# Patient Record
Sex: Male | Born: 1950
Health system: Southern US, Community
[De-identification: ages and names within clinical notes are randomized; demographics above are authoritative.]

## PROBLEM LIST (undated history)

## (undated) DIAGNOSIS — I714 Abdominal aortic aneurysm, without rupture, unspecified: Secondary | ICD-10-CM

## (undated) DIAGNOSIS — I2510 Atherosclerotic heart disease of native coronary artery without angina pectoris: Secondary | ICD-10-CM

## (undated) DIAGNOSIS — F419 Anxiety disorder, unspecified: Secondary | ICD-10-CM

## (undated) DIAGNOSIS — G4733 Obstructive sleep apnea (adult) (pediatric): Secondary | ICD-10-CM

## (undated) DIAGNOSIS — E785 Hyperlipidemia, unspecified: Secondary | ICD-10-CM

## (undated) DIAGNOSIS — I5032 Chronic diastolic (congestive) heart failure: Secondary | ICD-10-CM

## (undated) DIAGNOSIS — E118 Type 2 diabetes mellitus with unspecified complications: Secondary | ICD-10-CM

## (undated) DIAGNOSIS — I10 Essential (primary) hypertension: Secondary | ICD-10-CM

## (undated) DIAGNOSIS — I723 Aneurysm of iliac artery: Secondary | ICD-10-CM

## (undated) DIAGNOSIS — I4892 Unspecified atrial flutter: Secondary | ICD-10-CM

## (undated) DIAGNOSIS — C801 Malignant (primary) neoplasm, unspecified: Secondary | ICD-10-CM

## (undated) DIAGNOSIS — J449 Chronic obstructive pulmonary disease, unspecified: Secondary | ICD-10-CM

## (undated) DIAGNOSIS — J45909 Unspecified asthma, uncomplicated: Secondary | ICD-10-CM

## (undated) HISTORY — PX: ADENOIDECTOMY: SUR15

## (undated) HISTORY — DX: Obstructive sleep apnea (adult) (pediatric): G47.33

## (undated) HISTORY — PX: CARDIAC CATHETERIZATION: SHX172

## (undated) HISTORY — DX: Malignant (primary) neoplasm, unspecified: C80.1

## (undated) HISTORY — DX: Abdominal aortic aneurysm, without rupture, unspecified: I71.40

## (undated) HISTORY — DX: Anxiety disorder, unspecified: F41.9

## (undated) HISTORY — PX: HERNIA REPAIR: SHX51

## (undated) HISTORY — PX: ELBOW SURGERY: SHX618

## (undated) HISTORY — PX: UMBILICAL HERNIA REPAIR: SHX196

## (undated) HISTORY — DX: Chronic diastolic (congestive) heart failure: I50.32

## (undated) HISTORY — DX: Chronic obstructive pulmonary disease, unspecified: J44.9

## (undated) HISTORY — DX: Unspecified asthma, uncomplicated: J45.909

## (undated) HISTORY — DX: Atherosclerotic heart disease of native coronary artery without angina pectoris: I25.10

## (undated) HISTORY — DX: Type 2 diabetes mellitus with unspecified complications: E11.8

## (undated) HISTORY — PX: AMPUTATION: SHX166

## (undated) HISTORY — DX: Essential (primary) hypertension: I10

## (undated) HISTORY — DX: Abdominal aortic aneurysm, without rupture: I71.4

## (undated) HISTORY — PX: APPENDECTOMY: SHX54

## (undated) HISTORY — DX: Hyperlipidemia, unspecified: E78.5

## (undated) HISTORY — PX: TONSILECTOMY, ADENOIDECTOMY, BILATERAL MYRINGOTOMY AND TUBES: SHX2538

## (undated) HISTORY — DX: Unspecified atrial flutter: I48.92

## (undated) HISTORY — PX: PILONIDAL CYST EXCISION: SHX744

## (undated) HISTORY — PX: NASAL SINUS SURGERY: SHX719

## (undated) HISTORY — DX: Aneurysm of iliac artery: I72.3

---

## 1993-09-17 HISTORY — PX: PENILE PROSTHESIS IMPLANT: SHX240

## 2000-04-08 ENCOUNTER — Emergency Department (HOSPITAL_COMMUNITY): Admission: EM | Admit: 2000-04-08 | Discharge: 2000-04-08 | Payer: Self-pay | Admitting: Emergency Medicine

## 2002-11-04 ENCOUNTER — Ambulatory Visit (HOSPITAL_COMMUNITY): Admission: RE | Admit: 2002-11-04 | Discharge: 2002-11-05 | Payer: Self-pay | Admitting: Cardiovascular Disease

## 2003-03-16 HISTORY — PX: CORONARY ANGIOPLASTY: SHX604

## 2003-07-20 ENCOUNTER — Other Ambulatory Visit: Payer: Self-pay

## 2003-10-15 ENCOUNTER — Other Ambulatory Visit: Payer: Self-pay

## 2004-01-29 ENCOUNTER — Other Ambulatory Visit: Payer: Self-pay

## 2005-10-23 ENCOUNTER — Ambulatory Visit: Payer: Self-pay | Admitting: Internal Medicine

## 2005-10-30 ENCOUNTER — Ambulatory Visit: Payer: Self-pay | Admitting: Internal Medicine

## 2006-11-11 ENCOUNTER — Ambulatory Visit: Payer: Self-pay | Admitting: Family Medicine

## 2006-11-13 ENCOUNTER — Ambulatory Visit: Payer: Self-pay | Admitting: Internal Medicine

## 2006-11-27 ENCOUNTER — Ambulatory Visit: Payer: Self-pay | Admitting: Internal Medicine

## 2006-12-17 ENCOUNTER — Ambulatory Visit: Payer: Self-pay | Admitting: Internal Medicine

## 2007-01-16 ENCOUNTER — Ambulatory Visit: Payer: Self-pay | Admitting: Internal Medicine

## 2007-02-16 ENCOUNTER — Ambulatory Visit: Payer: Self-pay | Admitting: Internal Medicine

## 2007-02-21 ENCOUNTER — Ambulatory Visit: Payer: Self-pay | Admitting: Internal Medicine

## 2007-03-18 ENCOUNTER — Ambulatory Visit: Payer: Self-pay | Admitting: Internal Medicine

## 2008-10-18 ENCOUNTER — Ambulatory Visit: Payer: Self-pay | Admitting: Internal Medicine

## 2008-11-08 ENCOUNTER — Ambulatory Visit: Payer: Self-pay | Admitting: Internal Medicine

## 2009-04-13 ENCOUNTER — Ambulatory Visit: Payer: Self-pay | Admitting: Nephrology

## 2010-05-06 ENCOUNTER — Emergency Department: Payer: Self-pay | Admitting: Emergency Medicine

## 2011-10-19 ENCOUNTER — Ambulatory Visit: Payer: Self-pay

## 2013-08-23 ENCOUNTER — Emergency Department: Payer: Self-pay | Admitting: Emergency Medicine

## 2013-08-23 LAB — COMPREHENSIVE METABOLIC PANEL
Albumin: 2.9 g/dL — ABNORMAL LOW (ref 3.4–5.0)
Alkaline Phosphatase: 69 U/L
Calcium, Total: 9.1 mg/dL (ref 8.5–10.1)
Chloride: 100 mmol/L (ref 98–107)
Creatinine: 0.85 mg/dL (ref 0.60–1.30)
EGFR (Non-African Amer.): >60
SGOT(AST): 16 U/L (ref 15–37)
SGPT (ALT): 40 U/L (ref 12–78)
Sodium: 135 mmol/L — ABNORMAL LOW (ref 136–145)
Total Protein: 7.8 g/dL (ref 6.4–8.2)

## 2013-08-23 LAB — PROTIME-INR
INR: 1
Prothrombin Time: 13.3 secs (ref 11.5–14.7)

## 2013-08-23 LAB — TROPONIN I: Troponin-I: <0.02 ng/mL

## 2013-08-23 LAB — CBC
HCT: 35.3 % — ABNORMAL LOW (ref 40.0–52.0)
HGB: 12 g/dL — ABNORMAL LOW (ref 13.0–18.0)
MCH: 37.8 pg — ABNORMAL HIGH (ref 26.0–34.0)
MCHC: 33.9 g/dL (ref 32.0–36.0)
Platelet: 125 10*3/uL — ABNORMAL LOW (ref 150–440)
RBC: 3.16 10*6/uL — ABNORMAL LOW (ref 4.40–5.90)
RDW: 17.3 % — ABNORMAL HIGH (ref 11.5–14.5)

## 2013-08-23 LAB — LIPASE, BLOOD: Lipase: 109 U/L (ref 73–393)

## 2014-04-01 ENCOUNTER — Emergency Department: Payer: Self-pay | Admitting: Emergency Medicine

## 2014-04-01 LAB — BASIC METABOLIC PANEL
ANION GAP: 7 (ref 7–16)
BUN: 11 mg/dL (ref 7–18)
CREATININE: 0.89 mg/dL (ref 0.60–1.30)
Calcium, Total: 7.9 mg/dL — ABNORMAL LOW (ref 8.5–10.1)
Chloride: 106 mmol/L (ref 98–107)
Co2: 27 mmol/L (ref 21–32)
EGFR (African American): >60
GLUCOSE: 119 mg/dL — AB (ref 65–99)
Osmolality: 280 (ref 275–301)
Potassium: 3.8 mmol/L (ref 3.5–5.1)
Sodium: 140 mmol/L (ref 136–145)

## 2014-04-01 LAB — CBC
HCT: 37.9 % — AB (ref 40.0–52.0)
HGB: 12.6 g/dL — AB (ref 13.0–18.0)
MCH: 37.5 pg — AB (ref 26.0–34.0)
MCHC: 33.2 g/dL (ref 32.0–36.0)
MCV: 113 fL — ABNORMAL HIGH (ref 80–100)
PLATELETS: 138 10*3/uL — AB (ref 150–440)
RBC: 3.36 10*6/uL — AB (ref 4.40–5.90)
RDW: 16.8 % — ABNORMAL HIGH (ref 11.5–14.5)
WBC: 7.9 10*3/uL (ref 3.8–10.6)

## 2014-04-01 LAB — TROPONIN I: Troponin-I: <0.02 ng/mL

## 2014-04-01 LAB — ETHANOL
ETHANOL LVL: 280 mg/dL
Ethanol %: 0.28 % — ABNORMAL HIGH (ref 0.000–0.080)

## 2014-04-01 LAB — PRO B NATRIURETIC PEPTIDE: B-Type Natriuretic Peptide: 268 pg/mL — ABNORMAL HIGH (ref 0–125)

## 2014-08-19 ENCOUNTER — Encounter: Payer: Self-pay | Admitting: Cardiovascular Disease

## 2014-08-19 ENCOUNTER — Ambulatory Visit (INDEPENDENT_AMBULATORY_CARE_PROVIDER_SITE_OTHER): Payer: Medicare PPO | Admitting: Cardiovascular Disease

## 2014-08-19 ENCOUNTER — Encounter (INDEPENDENT_AMBULATORY_CARE_PROVIDER_SITE_OTHER): Payer: Self-pay

## 2014-08-19 VITALS — BP 110/60 | HR 69 | Ht 69.0 in | Wt 259.5 lb

## 2014-08-19 DIAGNOSIS — I714 Abdominal aortic aneurysm, without rupture, unspecified: Secondary | ICD-10-CM

## 2014-08-19 DIAGNOSIS — R0602 Shortness of breath: Secondary | ICD-10-CM

## 2014-08-19 DIAGNOSIS — E785 Hyperlipidemia, unspecified: Secondary | ICD-10-CM

## 2014-08-19 DIAGNOSIS — I25111 Atherosclerotic heart disease of native coronary artery with angina pectoris with documented spasm: Secondary | ICD-10-CM

## 2014-08-19 DIAGNOSIS — R079 Chest pain, unspecified: Secondary | ICD-10-CM

## 2014-08-19 DIAGNOSIS — I723 Aneurysm of iliac artery: Secondary | ICD-10-CM

## 2014-08-19 DIAGNOSIS — I10 Essential (primary) hypertension: Secondary | ICD-10-CM

## 2014-08-19 DIAGNOSIS — J439 Emphysema, unspecified: Secondary | ICD-10-CM

## 2014-08-19 DIAGNOSIS — I25110 Atherosclerotic heart disease of native coronary artery with unstable angina pectoris: Secondary | ICD-10-CM

## 2014-08-19 NOTE — Patient Instructions (Addendum)
You are doing well. No medication changes were made.  We will schedule a lexiscan myoview   Please no food the morning of the test Hold the isosorbide and atenolol, the morning of the test  Please call us if you have new issues that need to be addressed before your next appt.  Your physician wants you to follow-up in: 6 months.  You will receive a reminder letter in the mail two months in advance. If you don't receive a letter, please call our office to schedule the follow-up appointment.  [place]  Your caregiver has ordered a Stress Test with nuclear imaging. The purpose of this test is to evaluate the blood supply to your heart muscle. This procedure is referred to as a "Non-Invasive Stress Test." This is because other than having an IV started in your vein, nothing is inserted or "invades" your body. Cardiac stress tests are done to find areas of poor blood flow to the heart by determining the extent of coronary artery disease (CAD). Some patients exercise on a treadmill, which naturally increases the blood flow to your heart, while others who are  unable to walk on a treadmill due to physical limitations have a pharmacologic/chemical stress agent called Lexiscan . This medicine will mimic walking on a treadmill by temporarily increasing your coronary blood flow.   Please note: these test may take anywhere between 2-4 hours to complete  PLEASE REPORT TO [place] AT THE FIRST DESK WILL DIRECT YOU WHERE TO GO  Date of Procedure:_____Tuesday, December 8_______  Arrival Time for Procedure:_____7:15 am_____________  Instructions regarding medication:   __X__:  Hold other medications as follows:____isosorbide and atenolol, the morning of the test_______________    PLEASE NOTIFY THE OFFICE AT LEAST 24 HOURS IN ADVANCE IF YOU ARE UNABLE TO [place].  [phone] AND  PLEASE NOTIFY NUCLEAR MEDICINE AT [facility] AT LEAST 24 HOURS IN ADVANCE IF  YOU ARE UNABLE TO KEEP YOUR APPOINTMENT. [phone]  How to prepare for your Myoview test:  1. Do not eat or drink after midnight 2. No caffeine for 24 hours prior to test 3. No smoking 24 hours prior to test. 4. Your medication may be taken with water.  If your doctor stopped a medication because of this test, do not take that medication. 5. Ladies, please do not wear dresses.  Skirts or pants are appropriate. Please wear a short sleeve shirt. 6. No perfume, cologne or lotion. 7. Wear comfortable walking shoes. No heels!

## 2014-08-20 DIAGNOSIS — R079 Chest pain, unspecified: Secondary | ICD-10-CM | POA: Insufficient documentation

## 2014-08-20 DIAGNOSIS — I25110 Atherosclerotic heart disease of native coronary artery with unstable angina pectoris: Secondary | ICD-10-CM | POA: Insufficient documentation

## 2014-08-20 DIAGNOSIS — J439 Emphysema, unspecified: Secondary | ICD-10-CM | POA: Insufficient documentation

## 2014-08-20 DIAGNOSIS — I714 Abdominal aortic aneurysm, without rupture, unspecified: Secondary | ICD-10-CM | POA: Insufficient documentation

## 2014-08-20 DIAGNOSIS — R0602 Shortness of breath: Secondary | ICD-10-CM | POA: Insufficient documentation

## 2014-08-20 DIAGNOSIS — I10 Essential (primary) hypertension: Secondary | ICD-10-CM | POA: Insufficient documentation

## 2014-08-20 DIAGNOSIS — I723 Aneurysm of iliac artery: Secondary | ICD-10-CM | POA: Insufficient documentation

## 2014-08-20 DIAGNOSIS — E785 Hyperlipidemia, unspecified: Secondary | ICD-10-CM | POA: Insufficient documentation

## 2014-08-20 DIAGNOSIS — I25111 Atherosclerotic heart disease of native coronary artery with angina pectoris with documented spasm: Secondary | ICD-10-CM | POA: Insufficient documentation

## 2014-08-20 NOTE — Assessment & Plan Note (Signed)
4.8 cm AAA noted, managed by vascular surgery at [facility]. If stress test shows no significant ischemia, no further cardiac workup would be needed prior to surgery

## 2014-08-20 NOTE — Assessment & Plan Note (Signed)
Notes from a CT indicate 2.8 and 2.2 cm iliac artery aneurysms on the right and left, surgical intervention planned at [facility]

## 2014-08-20 NOTE — Assessment & Plan Note (Signed)
Blood pressure is well controlled on today's visit. No changes made to the medications. He denies any lightheadedness concerning for orthostasis

## 2014-08-20 NOTE — Assessment & Plan Note (Signed)
Chest pain symptoms concerning for unstable angina. Previously noted coronary artery disease by cardiac catheterization in 2010. He is unable to treadmill. Pharmacologic Myoview has been ordered. This will also help with preoperative evaluation prior to aneurysm repair

## 2014-08-20 NOTE — Progress Notes (Signed)
Patient ID: Lawrence Steele, male    DOB: 1951/03/08, 63 y.o.   MRN: [id number]  HPI Comments: Lawrence Steele is a pleasant 63 year old gentleman, patient of Dr. Clayborn Bigness with a history of COPD, coronary artery disease, long history of smoking, diastolic CHF, hypertension, hyperlipidemia, who presents to establish care in the office for management of his coronary artery disease.  He reports a long history of shortness of breath, presents today on nasal cannula oxygen. He is unable to walk for a far secondary to shortness of breath symptoms. He is on several inhalers which he brings with him today. He has been seen at [facility] for a moderately large abdominal aortic aneurysm as well as dilated iliac arteries. He was scheduled to see cardiology at [facility] for preop evaluation prior to endograft/stent placement. He did not want to drive was unable to drive secondary to vision issues and preferred to have his cardiac evaluation in [place].  Review of his records shows a cardiac catheterization 01/18/2009. This was reviewed with him in detail. Notes indicate he has proximal 20% RCA disease, 10% left main disease, 30 follow by 50% mid left circumflex disease, 20% OM1 disease, 20% OM 2 disease, 20% disease of a ramus branch, 20% mid LAD disease and 20% D1 disease. No intervention was performed at that time He does report a cardiac catheterization prior to that. No further cardiac catheterization or stress testing since 2010  He does report increasing chest pain over the past several years, worse recently. Increasing shortness of breath recently, also with arm pain, possible numbness in his arm  Recently seen in the hospital 04/01/2014 for chest pain, shortness of breath. Chest x-ray showed scarring at the left base, lab work reviewed with him showing negative cardiac enzymes, normal renal function, elevated alcohol level 0.28, BNP 270, EKG with no significant ST or T-wave changes  He reports that he was unable to  tolerate any of the statins. He has tried Lipitor among others before. He does not know all of their names  Records below were researched and found on care anywhere, reviewed with the patient today Echocardiogram done at [facility] 07/28/2014 showing normal LV systolic function, dilated thoracic aorta 4.0 cm at the sinus of Valsalva, 3.6 cm above the sinotubular junction, LVH otherwise normal study  CT scan of the abdomen details a infrarenal abdominal aortic aneurysm arising at the level of the IMA measuring up to 4.8 x 4.3 cm, iliac artery aneurysm 2.6 on the right, 2.2 on the left  EKG on today's visit shows normal sinus rhythm with rate 69 bpm, no significant ST or T-wave changes  Allergies  Allergen Reactions  . Codeine Itching    Outpatient Encounter Prescriptions as of 08/19/2014  Medication Sig  . acetaminophen (TYLENOL) 500 MG tablet Take 500 mg by mouth every 6 (six) hours as needed.   Marland Kitchen albuterol (PROAIR HFA) 108 (90 BASE) MCG/ACT inhaler Inhale 2 puffs into the lungs every 6 (six) hours as needed.   Marland Kitchen allopurinol (ZYLOPRIM) 100 MG tablet Take 100 mg by mouth daily.   Marland Kitchen aspirin 81 MG tablet Take 162 mg by mouth daily.   Marland Kitchen atenolol (TENORMIN) 25 MG tablet Take 25 mg by mouth daily.  . cyclobenzaprine (FLEXERIL) 10 MG tablet Take 10 mg by mouth at bedtime.   Marland Kitchen doxycycline (VIBRA-TABS) 100 MG tablet Take 100 mg by mouth daily.   . Fluticasone-Salmeterol (ADVAIR DISKUS) 250-50 MCG/DOSE AEPB Inhale 1 puff into the lungs 2 (two) times daily.  Frequency:BID   Dosage:0.0     Instructions:  Note:Dose: 250-50MCG  . isosorbide mononitrate (IMDUR) 60 MG 24 hr tablet Take 60 mg by mouth daily.   Marland Kitchen loratadine (CLARITIN) 10 MG tablet Take 10 mg by mouth daily as needed.   . nitroGLYCERIN (NITROSTAT) 0.4 MG SL tablet Place 0.4 mg under the tongue.  . OXYGEN Inhale into the lungs. 2 liters @@ 24 hours daily.  Marland Kitchen tiotropium (SPIRIVA HANDIHALER) 18 MCG inhalation capsule Place 18 mcg into inhaler and  inhale daily. Frequency:QD   Dosage:18   MCG  Instructions:  Note:Dose: 18MCG  . topiramate (TOPAMAX) 50 MG tablet Take 50 mg by mouth daily.   . traZODone (DESYREL) 100 MG tablet Take 100 mg by mouth at bedtime.   . [DISCONTINUED] atenolol (TENORMIN) 50 MG tablet Take 25 mg by mouth daily.     Past Medical History  Diagnosis Date  . Coronary artery disease   . CHF (congestive heart failure)   . COPD (chronic obstructive pulmonary disease)   . Hyperlipidemia   . Hypertension   . ESRD (end stage renal disease)   . Sleep apnea   . Obstructive sleep apnea-hypopnea syndrome   . Anxiety   . Asthma   . AAA (abdominal aortic aneurysm)     Past Surgical History  Procedure Laterality Date  . Adenoidectomy    . Tonsilectomy, adenoidectomy, bilateral myringotomy and tubes    . Elbow surgery    . Penile prosthesis implant  1995  . Hernia repair    . Cardiac catheterization  2010,03/16/2003,11/04/2002  . Coronary angioplasty  03/16/2003    stent to the RCA  & 2/18/2004stent mid circumflex    Social History  reports that he has been smoking Cigarettes.  He has a 11.25 pack-year smoking history. He does not have any smokeless tobacco history on file. He reports that he drinks alcohol. He reports that he does not use illicit drugs.  Family History family history includes Heart attack (age of onset: 54) in his father; Heart disease in his father; Hyperlipidemia in his father and mother; Hypertension in his father and mother.    Review of Systems  Constitutional: Negative.   Respiratory: Positive for shortness of breath.   Cardiovascular: Negative.   Gastrointestinal: Negative.   Musculoskeletal: Negative.   Neurological: Negative.   Hematological: Negative.   All other systems reviewed and are negative.   BP 110/60 mmHg  Pulse 69  Ht 5\' 9"  (1.753 m)  Wt 259 lb 8 oz (117.708 kg)  BMI 38.30 kg/m2  Physical Exam  Constitutional: He is oriented to person, place, and time. He  appears well-developed and well-nourished.  Obese, nasal cannula oxygen in place  HENT:  Head: Normocephalic.  Nose: Nose normal.  Mouth/Throat: Oropharynx is clear and moist.  Eyes: Conjunctivae are normal. Pupils are equal, round, and reactive to light.  Neck: Normal range of motion. Neck supple. No JVD present.  Cardiovascular: Normal rate, regular rhythm, S1 normal, S2 normal, normal heart sounds and intact distal pulses.  Exam reveals no gallop and no friction rub.   No murmur heard. Pulmonary/Chest: Effort normal. No respiratory distress. He has decreased breath sounds. He has no wheezes. He has no rales. He exhibits no tenderness.  Abdominal: Soft. Bowel sounds are normal. He exhibits no distension. There is no tenderness.  Musculoskeletal: Normal range of motion. He exhibits no edema or tenderness.  Lymphadenopathy:    He has no cervical adenopathy.  Neurological: He is alert and  oriented to person, place, and time. Coordination normal.  Skin: Skin is warm and dry. No rash noted. No erythema.  Psychiatric: He has a normal mood and affect. His behavior is normal. Judgment and thought content normal.      Assessment and Plan   Nursing note and vitals reviewed.

## 2014-08-20 NOTE — Assessment & Plan Note (Signed)
Previous cardiac catheterization reviewed with him. Moderate disease previously noted in the circumflex. Stress test ordered given his chest pain and shortness of breath

## 2014-08-20 NOTE — Assessment & Plan Note (Signed)
We have encouraged  careful diet management in an effort to lose weight.  

## 2014-08-20 NOTE — Assessment & Plan Note (Signed)
Chronic severe shortness of breath. On supplemental oxygen. He is on several inhalers as listed Managed by pulmonary. He reports breathing is stable

## 2014-08-20 NOTE — Assessment & Plan Note (Signed)
Recent echocardiogram showing normal LV function, no mention of elevated right heart pressures. Shortness of breath likely secondary to ischemia. If stress test shows no ischemia, symptoms secondary to underlying COPD

## 2014-08-20 NOTE — Assessment & Plan Note (Addendum)
He has been unable to tolerate any statins. Most recent lipid panel not available. These have been requested. He does not remember trying Crestor. Could potentially try very low-dose Crestor

## 2014-08-24 ENCOUNTER — Ambulatory Visit: Payer: Self-pay | Admitting: Cardiovascular Disease

## 2014-08-24 DIAGNOSIS — R079 Chest pain, unspecified: Secondary | ICD-10-CM

## 2014-08-25 ENCOUNTER — Other Ambulatory Visit: Payer: Self-pay

## 2014-08-25 DIAGNOSIS — I25111 Atherosclerotic heart disease of native coronary artery with angina pectoris with documented spasm: Secondary | ICD-10-CM

## 2014-08-25 DIAGNOSIS — R079 Chest pain, unspecified: Secondary | ICD-10-CM

## 2014-08-25 DIAGNOSIS — R0602 Shortness of breath: Secondary | ICD-10-CM

## 2014-08-26 ENCOUNTER — Telehealth: Payer: Self-pay | Admitting: *Deleted

## 2014-08-26 NOTE — Telephone Encounter (Signed)
Patient called regarding stress test results.

## 2014-08-26 NOTE — Telephone Encounter (Signed)
Results have been sent to Dr. Rockey Situ for review.

## 2014-08-31 ENCOUNTER — Telehealth: Payer: Self-pay | Admitting: *Deleted

## 2014-08-31 NOTE — Telephone Encounter (Signed)
Patient calling regarding his stress test result. Please call asap.

## 2014-08-31 NOTE — Telephone Encounter (Signed)
Please see result note 

## 2014-12-02 ENCOUNTER — Ambulatory Visit: Payer: Self-pay | Admitting: Nurse Practitioner

## 2015-02-15 ENCOUNTER — Ambulatory Visit: Payer: Medicare PPO | Admitting: Cardiovascular Disease

## 2015-03-06 HISTORY — PX: ABDOMINAL AORTIC ANEURYSM REPAIR: SUR1152

## 2015-03-25 ENCOUNTER — Encounter: Payer: Self-pay | Admitting: Cardiovascular Disease

## 2015-03-25 ENCOUNTER — Ambulatory Visit (INDEPENDENT_AMBULATORY_CARE_PROVIDER_SITE_OTHER): Payer: Medicare PPO | Admitting: Cardiovascular Disease

## 2015-03-25 VITALS — BP 110/70 | HR 71 | Ht 70.0 in | Wt 260.0 lb

## 2015-03-25 DIAGNOSIS — R0602 Shortness of breath: Secondary | ICD-10-CM | POA: Diagnosis not present

## 2015-03-25 DIAGNOSIS — I714 Abdominal aortic aneurysm, without rupture, unspecified: Secondary | ICD-10-CM

## 2015-03-25 DIAGNOSIS — I10 Essential (primary) hypertension: Secondary | ICD-10-CM

## 2015-03-25 DIAGNOSIS — I25111 Atherosclerotic heart disease of native coronary artery with angina pectoris with documented spasm: Secondary | ICD-10-CM

## 2015-03-25 DIAGNOSIS — E785 Hyperlipidemia, unspecified: Secondary | ICD-10-CM

## 2015-03-25 DIAGNOSIS — J432 Centrilobular emphysema: Secondary | ICD-10-CM

## 2015-03-25 NOTE — Assessment & Plan Note (Signed)
Blood pressure is well controlled on today's visit. No changes made to the medications. 

## 2015-03-25 NOTE — Progress Notes (Signed)
Patient ID: Lawrence Steele, male    DOB: 1950/12/16, 64 y.o.   MRN: [id number]  HPI Comments: [name] is a pleasant 64 year old gentleman, patient of Dr. Clayborn Bigness with a history of COPD, coronary artery disease, long history of smoking, diastolic CHF, hypertension, hyperlipidemia, who presents  for follow-up of his coronary artery disease.  He is seen by Dr. Clayborn Bigness and  the same office for pulmonary issues. He has completed  AAA endovascular stent at [facility] reports having no complications . Denies having any chest pain concerning for angina   previously have problems on Crestor. Took himself off Lipitor as he felt this was causing a stomach issue. Now unclear if this was the issue. Prior history of cough syncope, none in the past several years Reports his lung issues are stable, uses chronic oxygen  EKG on today's visit shows normal sinus rhythm with rate 71 bpm, PVC  Other past medical history  long history of shortness of breath, on several inhalers  He has been seen at [facility] for a moderately large abdominal aortic aneurysm as well as dilated iliac arteries.   cardiac catheterization 01/18/2009.  Notes indicate he has proximal 20% RCA disease, 10% left main disease, 30 follow by 50% mid left circumflex disease, 20% OM1 disease, 20% OM 2 disease, 20% disease of a ramus branch, 20% mid LAD disease and 20% D1 disease. No intervention was performed at that time He does report a cardiac catheterization prior to that. No further cardiac catheterization or stress testing since 2010  Previously reported chest pain over the past several years.   with arm pain, possible numbness in his arm   in the hospital 04/01/2014 for chest pain, shortness of breath. Chest x-ray showed scarring at the left base, lab work reviewed with him showing negative cardiac enzymes, normal renal function, elevated alcohol level 0.28, BNP 270, EKG with no significant ST or T-wave changes  Records below were researched  and found on care anywhere, reviewed with the patient today Echocardiogram done at [facility] 07/28/2014 showing normal LV systolic function, dilated thoracic aorta 4.0 cm at the sinus of Valsalva, 3.6 cm above the sinotubular junction, LVH otherwise normal study  CT scan of the abdomen details a infrarenal abdominal aortic aneurysm arising at the level of the IMA measuring up to 4.8 x 4.3 cm, iliac artery aneurysm 2.6 on the right, 2.2 on the left  Allergies  Allergen Reactions  . Codeine Itching    Outpatient Encounter Prescriptions as of 64/04/2015  Medication Sig  . acetaminophen (TYLENOL) 500 MG tablet Take 500 mg by mouth every 6 (six) hours as needed.   Marland Kitchen albuterol (PROAIR HFA) 108 (90 BASE) MCG/ACT inhaler Inhale 2 puffs into the lungs every 6 (six) hours as needed.   Marland Kitchen allopurinol (ZYLOPRIM) 100 MG tablet Take 100 mg by mouth daily.   Marland Kitchen aspirin 81 MG tablet Take 162 mg by mouth daily.   Marland Kitchen atenolol (TENORMIN) 25 MG tablet Take 25 mg by mouth daily.  . cyclobenzaprine (FLEXERIL) 10 MG tablet Take 10 mg by mouth at bedtime.   Marland Kitchen doxycycline (VIBRA-TABS) 100 MG tablet Take 100 mg by mouth daily.   . Fluticasone-Salmeterol (ADVAIR DISKUS) 250-50 MCG/DOSE AEPB Inhale 1 puff into the lungs 2 (two) times daily. Frequency:BID   Dosage:0.0     Instructions:  Note:Dose: 250-50MCG  . isosorbide mononitrate (IMDUR) 60 MG 24 hr tablet Take 60 mg by mouth daily.   Marland Kitchen loratadine (CLARITIN) 10 MG tablet  Take 10 mg by mouth daily as needed.   . nitroGLYCERIN (NITROSTAT) 0.4 MG SL tablet Place 0.4 mg under the tongue.  . OXYGEN Inhale into the lungs. 2 liters @@ 24 hours daily.  Marland Kitchen tiotropium (SPIRIVA HANDIHALER) 18 MCG inhalation capsule Place 18 mcg into inhaler and inhale daily. Frequency:QD   Dosage:18   MCG  Instructions:  Note:Dose: 18MCG  . traZODone (DESYREL) 100 MG tablet Take 100 mg by mouth at bedtime.   . [DISCONTINUED] topiramate (TOPAMAX) 50 MG tablet Take 50 mg by mouth daily.    No  facility-administered encounter medications on file as of 64/04/2015.    Past Medical History  Diagnosis Date  . Coronary artery disease   . CHF (congestive heart failure)   . COPD (chronic obstructive pulmonary disease)   . Hyperlipidemia   . Hypertension   . ESRD (end stage renal disease)   . Sleep apnea   . Obstructive sleep apnea-hypopnea syndrome   . Anxiety   . Asthma   . AAA (abdominal aortic aneurysm)   . Iliac artery aneurysm, bilateral   . AAA (abdominal aortic aneurysm)     Past Surgical History  Procedure Laterality Date  . Adenoidectomy    . Tonsilectomy, adenoidectomy, bilateral myringotomy and tubes    . Elbow surgery    . Penile prosthesis implant  1995  . Hernia repair    . Cardiac catheterization  2010,03/16/2003,11/04/2002  . Coronary angioplasty  03/16/2003    stent to the RCA  & 2/18/2004stent mid circumflex  . Abdominal aortic aneurysm repair  03/06/2015    [place] History  reports that he has been smoking Cigarettes.  He has a 11.25 pack-year smoking history. He does not have any smokeless tobacco history on file. He reports that he drinks alcohol. He reports that he does not use illicit drugs.  Family History family history includes Heart attack (age of onset: 85) in his father; Heart disease in his father; Hyperlipidemia in his father and mother; Hypertension in his father and mother.    Review of Systems  Constitutional: Negative.   Respiratory: Positive for shortness of breath.   Cardiovascular: Negative.   Gastrointestinal: Negative.   Musculoskeletal: Negative.   Neurological: Negative.   Hematological: Negative.   All other systems reviewed and are negative.   BP 110/70 mmHg  Pulse 71  Ht 5\' 10"  (1.778 m)  Wt 260 lb (117.935 kg)  BMI 37.31 kg/m2  Physical Exam  Constitutional: He is oriented to person, place, and time. He appears well-developed and well-nourished.  Obese, nasal cannula oxygen in place  HENT:  Head:  Normocephalic.  Nose: Nose normal.  Mouth/Throat: Oropharynx is clear and moist.  Eyes: Conjunctivae are normal. Pupils are equal, round, and reactive to light.  Neck: Normal range of motion. Neck supple. No JVD present.  Cardiovascular: Normal rate, regular rhythm, S1 normal, S2 normal, normal heart sounds and intact distal pulses.  Exam reveals no gallop and no friction rub.   No murmur heard. Pulmonary/Chest: Effort normal. No respiratory distress. He has decreased breath sounds. He has no wheezes. He has no rales. He exhibits no tenderness.  Abdominal: Soft. Bowel sounds are normal. He exhibits no distension. There is no tenderness.  Musculoskeletal: Normal range of motion. He exhibits no edema or tenderness.  Lymphadenopathy:    He has no cervical adenopathy.  Neurological: He is alert and oriented to person, place, and time. Coordination normal.  Skin: Skin is warm  and dry. No rash noted. No erythema.  Psychiatric: He has a normal mood and affect. His behavior is normal. Judgment and thought content normal.      Assessment and Plan   Nursing note and vitals reviewed.

## 2015-03-25 NOTE — Assessment & Plan Note (Signed)
COPD followed by pulmonary. He reports this is stable on chronic oxygen

## 2015-03-25 NOTE — Patient Instructions (Signed)
You are doing well. No medication changes were made.  Please call us if you have new issues that need to be addressed before your next appt.  Your physician wants you to follow-up in: 12 months.  You will receive a reminder letter in the mail two months in advance. If you don't receive a letter, please call our office to schedule the follow-up appointment. 

## 2015-03-25 NOTE — Assessment & Plan Note (Signed)
Currently with no symptoms of angina. No further workup at this time. Continue current medication regimen. 

## 2015-03-25 NOTE — Assessment & Plan Note (Signed)
Cholesterol monitored through primary care. We'll try to obtain these results when they are available, none recently Reports having a problem on Crestor in the past. Potentially retry Lipitor if needed. Unclear if he had stomach issues secondary to Lipitor or other etiology

## 2015-03-25 NOTE — Assessment & Plan Note (Signed)
Recent endograft placement at [facility] He reports they have periodic monitoring with ultrasound

## 2016-04-02 ENCOUNTER — Ambulatory Visit (INDEPENDENT_AMBULATORY_CARE_PROVIDER_SITE_OTHER): Payer: Medicare PPO | Admitting: Cardiovascular Disease

## 2016-04-02 ENCOUNTER — Encounter: Payer: Self-pay | Admitting: Cardiovascular Disease

## 2016-04-02 VITALS — BP 118/78 | HR 99 | Ht 70.0 in | Wt 255.8 lb

## 2016-04-02 DIAGNOSIS — I714 Abdominal aortic aneurysm, without rupture, unspecified: Secondary | ICD-10-CM

## 2016-04-02 DIAGNOSIS — J432 Centrilobular emphysema: Secondary | ICD-10-CM

## 2016-04-02 DIAGNOSIS — Z720 Tobacco use: Secondary | ICD-10-CM

## 2016-04-02 DIAGNOSIS — I10 Essential (primary) hypertension: Secondary | ICD-10-CM

## 2016-04-02 DIAGNOSIS — R0602 Shortness of breath: Secondary | ICD-10-CM

## 2016-04-02 DIAGNOSIS — I723 Aneurysm of iliac artery: Secondary | ICD-10-CM

## 2016-04-02 DIAGNOSIS — I25111 Atherosclerotic heart disease of native coronary artery with angina pectoris with documented spasm: Secondary | ICD-10-CM | POA: Diagnosis not present

## 2016-04-02 DIAGNOSIS — E785 Hyperlipidemia, unspecified: Secondary | ICD-10-CM

## 2016-04-02 DIAGNOSIS — I209 Angina pectoris, unspecified: Secondary | ICD-10-CM

## 2016-04-02 DIAGNOSIS — F17200 Nicotine dependence, unspecified, uncomplicated: Secondary | ICD-10-CM | POA: Insufficient documentation

## 2016-04-02 MED ORDER — ATENOLOL 25 MG PO TABS: 25.0000 mg | ORAL_TABLET | Freq: Every day | ORAL | Status: DC

## 2016-04-02 NOTE — Progress Notes (Signed)
Patient ID: Lawrence Steele, male   DOB: [birth year], 65 y.o.   MRN: [id number] Cardiology Office Note  Date:  04/02/2016   ID:  Lawrence Steele, DOB September 03, 1951, MRN [id number]  PCP:  [name], MD   Chief Complaint  Patient presents with  . other    1 yr f/u c/o chest pain and edema legs. Meds reviewd verbally with pt.    HPI:  [name] is a pleasant 65 year old gentleman, patient of Dr. Clayborn Bigness with a history of COPD, coronary artery disease, long history of smoking, still smoking 1 -2 in Am some after each meal, diastolic CHF, hypertension, hyperlipidemia, who presents for follow-up of his coronary artery disease. He has been seen at [facility] for a moderately large abdominal aortic aneurysm as well as dilated iliac arteries. Previous episode of cough syncope, none in several years   He is seen by Dr. Clayborn Bigness and the same office for pulmonary issues. He has completed AAA endovascular stent at [facility] reports  In follow-up today, not very active. Does not like to go outside in the heat, makes his breathing worse We did discuss taking a statin We have requested lab work from primary care previously had problems on Crestor. Took himself off Lipitor as he felt this was causing a stomach issue.  Reports his lung issues are stable, uses chronic oxygen He is open to retrying a statin if needed  Now on a metformin HBA1C 6.5 on medication Did not take his atenolol this AM, rate 100 Reports heart rate 70s at home  Rare chest pain, take NTG with improvement of symptoms In bed  when he lays down, sits up, not with exertion Does not have chest pain when he walks around [place] or does physical activity  EKG on today's visit shows normal sinus rhythm with rate 99 bpm  Other past medical history long history of shortness of breath, on several inhalers  He has been seen at [facility] for a moderately large abdominal aortic aneurysm as well as dilated iliac arteries.   cardiac catheterization  01/18/2009.  Notes indicate he has proximal 20% RCA disease, 10% left main disease, 30 follow by 50% mid left circumflex disease, 20% OM1 disease, 20% OM 2 disease, 20% disease of a ramus branch, 20% mid LAD disease and 20% D1 disease. No intervention was performed at that time He does report a cardiac catheterization prior to that. No further cardiac catheterization or stress testing since 2010  Previously reported chest pain over the past several years. with arm pain, possible numbness in his arm  in the hospital 04/01/2014 for chest pain, shortness of breath. Chest x-ray showed scarring at the left base, lab work reviewed with him showing negative cardiac enzymes, normal renal function, elevated alcohol level 0.28, BNP 270, EKG with no significant ST or T-wave changes  Records below were researched and found on care anywhere, reviewed with the patient today Echocardiogram done at [facility] 07/28/2014 showing normal LV systolic function, dilated thoracic aorta 4.0 cm at the sinus of Valsalva, 3.6 cm above the sinotubular junction, LVH otherwise normal study  CT scan of the abdomen details a infrarenal abdominal aortic aneurysm arising at the level of the IMA measuring up to 4.8 x 4.3 cm, iliac artery aneurysm 2.6 on the right, 2.2 on the left  PMH:   has a past medical history of Coronary artery disease; CHF (congestive heart failure) ([place]); COPD (chronic obstructive pulmonary disease) ([place]); Hyperlipidemia; Hypertension; ESRD (end stage renal disease) (  [place]); Sleep apnea; Obstructive sleep apnea-hypopnea syndrome; Anxiety; Asthma; AAA (abdominal aortic aneurysm) ([place]); Iliac artery aneurysm, bilateral ([facility]); AAA (abdominal aortic aneurysm) ([place]); and Diabetes mellitus without complication ([place]).  PSH:    Past Surgical History  Procedure Laterality Date  . Adenoidectomy    . Tonsilectomy, adenoidectomy, bilateral myringotomy and tubes    . Elbow surgery    . Penile prosthesis implant  1995  .  Hernia repair    . Cardiac catheterization  2010,03/16/2003,11/04/2002  . Coronary angioplasty  03/16/2003    stent to the RCA  & 2/18/2004stent mid circumflex  . Abdominal aortic aneurysm repair  03/06/2015    [facility]     Current Outpatient Prescriptions  Medication Sig Dispense Refill  . acetaminophen (TYLENOL) 500 MG tablet Take 500 mg by mouth every 6 (six) hours as needed.     Marland Kitchen albuterol (PROAIR HFA) 108 (90 BASE) MCG/ACT inhaler Inhale 2 puffs into the lungs every 6 (six) hours as needed.     Marland Kitchen allopurinol (ZYLOPRIM) 100 MG tablet Take 100 mg by mouth daily.     Marland Kitchen aspirin 81 MG tablet Take 162 mg by mouth daily.     Marland Kitchen atenolol (TENORMIN) 25 MG tablet Take 1 tablet (25 mg total) by mouth daily. 30 tablet 11  . cyclobenzaprine (FLEXERIL) 10 MG tablet Take 10 mg by mouth at bedtime.     Marland Kitchen doxycycline (VIBRA-TABS) 100 MG tablet Take 100 mg by mouth daily.     . Fluticasone-Salmeterol (ADVAIR DISKUS) 250-50 MCG/DOSE AEPB Inhale 1 puff into the lungs 2 (two) times daily. Frequency:BID   Dosage:0.0     Instructions:  Note:Dose: 250-50MCG    . isosorbide mononitrate (IMDUR) 60 MG 24 hr tablet Take 60 mg by mouth daily.     Marland Kitchen loratadine (CLARITIN) 10 MG tablet Take 10 mg by mouth daily as needed.     . metFORMIN (GLUCOPHAGE) 500 MG tablet Take 500 mg by mouth 2 (two) times daily with a meal.    . nitroGLYCERIN (NITROSTAT) 0.4 MG SL tablet Place 0.4 mg under the tongue.    . OXYGEN Inhale into the lungs. 2 liters @@ 24 hours daily.    Marland Kitchen tiotropium (SPIRIVA HANDIHALER) 18 MCG inhalation capsule Place 18 mcg into inhaler and inhale daily. Frequency:QD   Dosage:18   MCG  Instructions:  Note:Dose: 18MCG    . traZODone (DESYREL) 100 MG tablet Take 100 mg by mouth at bedtime.      No current facility-administered medications for this visit.     Allergies:   Codeine   Social History:  The patient  reports that he has been smoking Cigarettes.  He has a 11.25 pack-year smoking history. He does  not have any smokeless tobacco history on file. He reports that he drinks alcohol. He reports that he does not use illicit drugs.   Family History:   family history includes Heart attack (age of onset: 70) in his father; Heart disease in his father; Hyperlipidemia in his father and mother; Hypertension in his father and mother.    Review of Systems: Review of Systems  Constitutional: Negative.   Respiratory: Positive for shortness of breath.   Cardiovascular: Positive for chest pain.  Gastrointestinal: Negative.   Musculoskeletal: Negative.   Neurological: Negative.   Psychiatric/Behavioral: Negative.   All other systems reviewed and are negative.    PHYSICAL EXAM: VS:  BP 118/78 mmHg  Pulse 99  Ht 5\' 10"  (1.778 m)  Wt 255 lb 12  oz (116.007 kg)  BMI 36.70 kg/m2 , BMI Body mass index is 36.7 kg/(m^2). GEN: Well nourished, well developed, in no acute distress HEENT: normal Neck: no JVD, carotid bruits, or masses Cardiac: RRR; no murmurs, rubs, or gallops,no edema  Respiratory:  Decreased BS b/l, no wheezes or Rales normal work of breathing GI: soft, nontender, nondistended, + BS MS: no deformity or atrophy Skin: warm and dry, no rash Neuro:  Strength and sensation are intact Psych: euthymic mood, full affect    Recent Labs: No results found for requested labs within last 365 days.    Lipid Panel No results found for: CHOL, HDL, LDLCALC, TRIG    Wt Readings from Last 3 Encounters:  04/02/16 255 lb 12 oz (116.007 kg)  03/25/15 260 lb (117.935 kg)  08/19/14 259 lb 8 oz (117.708 kg)       ASSESSMENT AND PLAN:  Essential hypertension - Plan: EKG 12-Lead Blood pressure is well controlled on today's visit. No changes made to the medications.  Coronary artery disease involving native coronary artery of native heart with angina pectoris with documented spasm ([place]) - Plan: EKG 12-Lead He does have some chest pain concerning for angina, some atypical components He  prefers to wait on any stress testing at this time, he will continue to take nitroglycerin Recommended he call us if symptoms become more frequent, last longer, or associated with exertion  AAA (abdominal aortic aneurysm) without rupture ([place]) Managed by [facility], scheduled for annual CT scan  Iliac artery aneurysm, bilateral ([place]) Recent stent placement  Hyperlipidemia We have requested lab work for our records  SOB (shortness of breath) Chronic baseline COPD, reports is stable on oxygen  Centrilobular emphysema ([place]) On several inhalers which were reviewed with him He will also try albuterol for chest tightness  Ischemic chest pain [facility]) Long discussion concerning his chest pain symptoms. Unable to exclude ischemia He continues to smoke. He does not want stress testing at this time Details as above, he will call for worsening symptoms  Smoking We have encouraged him to continue to work on weaning his cigarettes and smoking cessation. He will continue to work on this and does not want any assistance with chantix.    Total encounter time more than 25 minutes  Greater than 50% was spent in counseling and coordination of care with the patient   Disposition:   F/U  6 months   Orders Placed This Encounter  Procedures  . EKG 12-Lead     Signed, Esmond Plants, M.D., Ph.D. 04/02/2016  Benjamin Perez, [place]

## 2016-04-02 NOTE — Patient Instructions (Addendum)
Monitor your heart rate   Medication Instructions:   No medication changes  Labwork:  We will try to track down the lipids numbers from Dr. Humphrey Rolls  Testing/Procedures:  Call if you have more frequent or worsening chest tightness   Follow-Up: It was a pleasure seeing you in the office today. Please call us if you have new issues that need to be addressed before your next appt.  [phone]  Your physician wants you to follow-up in: 6 months.  You will receive a reminder letter in the mail two months in advance. If you don't receive a letter, please call our office to schedule the follow-up appointment.  If you need a refill on your cardiac medications before your next appointment, please call your pharmacy.

## 2016-10-01 ENCOUNTER — Ambulatory Visit: Payer: Self-pay | Admitting: Cardiovascular Disease

## 2016-10-12 ENCOUNTER — Ambulatory Visit (INDEPENDENT_AMBULATORY_CARE_PROVIDER_SITE_OTHER): Payer: Medicare PPO | Admitting: Cardiovascular Disease

## 2016-10-12 ENCOUNTER — Encounter: Payer: Self-pay | Admitting: Cardiovascular Disease

## 2016-10-12 VITALS — BP 110/60 | HR 88 | Ht 69.0 in | Wt 260.2 lb

## 2016-10-12 DIAGNOSIS — I25111 Atherosclerotic heart disease of native coronary artery with angina pectoris with documented spasm: Secondary | ICD-10-CM

## 2016-10-12 DIAGNOSIS — R0602 Shortness of breath: Secondary | ICD-10-CM

## 2016-10-12 DIAGNOSIS — I10 Essential (primary) hypertension: Secondary | ICD-10-CM

## 2016-10-12 DIAGNOSIS — R0789 Other chest pain: Secondary | ICD-10-CM | POA: Diagnosis not present

## 2016-10-12 DIAGNOSIS — J432 Centrilobular emphysema: Secondary | ICD-10-CM

## 2016-10-12 DIAGNOSIS — F17200 Nicotine dependence, unspecified, uncomplicated: Secondary | ICD-10-CM

## 2016-10-12 DIAGNOSIS — I208 Other forms of angina pectoris: Secondary | ICD-10-CM

## 2016-10-12 NOTE — Patient Instructions (Addendum)
Medication Instructions:   No medication changes made  Labwork:  Please have your labs drawn at the hospital the day of your stress test Goal total chol <150, LDL <70  Testing/Procedures:  We will schedule a lexiscan stress test for  angina pain, known CAD [place]  Your caregiver has ordered a Stress Test with nuclear imaging. The purpose of this test is to evaluate the blood supply to your heart muscle. This procedure is referred to as a "Non-Invasive Stress Test." This is because other than having an IV started in your vein, nothing is inserted or "invades" your body. Cardiac stress tests are done to find areas of poor blood flow to the heart by determining the extent of coronary artery disease (CAD). Some patients exercise on a treadmill, which naturally increases the blood flow to your heart, while others who are  unable to walk on a treadmill due to physical limitations have a pharmacologic/chemical stress agent called Lexiscan . This medicine will mimic walking on a treadmill by temporarily increasing your coronary blood flow.   Please note: these test may take anywhere between 2-4 hours to complete  PLEASE REPORT TO [place] AT THE FIRST DESK WILL DIRECT YOU WHERE TO GO  Date of Procedure:___Thursday, Feb 8_______  Arrival Time for Procedure:__7:45 am_______  Instructions regarding medication:   __X__ : Hold diabetes medication morning of procedure  __X__:  Hold ATENOLOL the night before and morning of procedure  How to prepare for your Myoview test:  1. Do not eat or drink after midnight 2. No caffeine for 24 hours prior to test 3. No smoking 24 hours prior to test. 4. Your medication may be taken with water.  If your doctor stopped a medication because of this test, do not take that medication. 5. Please wear a short sleeve shirt. 6. No perfume, cologne or lotion.  I recommend watching educational videos on topics of interest to  you at:       [email or web address]  Enter code: [email or web address]    Follow-Up: It was a pleasure seeing you in the office today. Please call us if you have new issues that need to be addressed before your next appt.  [phone]  Your physician wants you to follow-up in: 6 months.  You will receive a reminder letter in the mail two months in advance. If you don't receive a letter, please call our office to schedule the follow-up appointment.  If you need a refill on your cardiac medications before your next appointment, please call your pharmacy.    Pharmacologic Stress Electrocardiogram A pharmacologic stress electrocardiogram is a heart (cardiac) test that uses nuclear imaging to evaluate the blood supply to your heart. This test may also be called a pharmacologic stress electrocardiography. Pharmacologic means that a medicine is used to increase your heart rate and blood pressure.  This stress test is done to find areas of poor blood flow to the heart by determining the extent of coronary artery disease (CAD). Some people exercise on a treadmill, which naturally increases the blood flow to the heart. For those people unable to exercise on a treadmill, a medicine is used. This medicine stimulates your heart and will cause your heart to beat harder and more quickly, as if you were exercising.  Pharmacologic stress tests can help determine:  The adequacy of blood flow to your heart during increased levels of activity in order to clear you for discharge home.  The extent of coronary  artery blockage caused by CAD.  Your prognosis if you have suffered a heart attack.  The effectiveness of cardiac procedures done, such as an angioplasty, which can increase the circulation in your coronary arteries.  Causes of chest pain or pressure. LET [facility] CARE PROVIDER KNOW ABOUT:  Any allergies you have.  All medicines you are taking, including vitamins, herbs, eye drops, creams, and over-the-counter  medicines.  Previous problems you or members of your family have had with the use of anesthetics.  Any blood disorders you have.  Previous surgeries you have had.  Medical conditions you have.  Possibility of pregnancy, if this applies.  If you are currently breastfeeding. RISKS AND COMPLICATIONS Generally, this is a safe procedure. However, as with any procedure, complications can occur. Possible complications include:  You develop pain or pressure in the following areas:  Chest.  Jaw or neck.  Between your shoulder blades.  Radiating down your left arm.  Headache.  Dizziness or light-headedness.  Shortness of breath.  Increased or irregular heartbeat.  Low blood pressure.  Nausea or vomiting.  Flushing.  Redness going up the arm and slight pain during injection of medicine.  Heart attack (rare). BEFORE THE PROCEDURE   Avoid all forms of caffeine for 24 hours before your test or as directed by your health care provider. This includes coffee, tea (even decaffeinated tea), caffeinated sodas, chocolate, cocoa, and certain pain medicines.  Follow your health care provider's instructions regarding eating and drinking before the test.  Take your medicines as directed at regular times with water unless instructed otherwise. Exceptions may include:  If you have diabetes, ask how you are to take your insulin or pills. It is common to adjust insulin dosing the morning of the test.  If you are taking beta-blocker medicines, it is important to talk to your health care provider about these medicines well before the date of your test. Taking beta-blocker medicines may interfere with the test. In some cases, these medicines need to be changed or stopped 24 hours or more before the test.  If you wear a nitroglycerin patch, it may need to be removed prior to the test. Ask your health care provider if the patch should be removed before the test.  If you use an inhaler for any  breathing condition, bring it with you to the test.  If you are an outpatient, bring a snack so you can eat right after the stress phase of the test.  Do not smoke for 4 hours prior to the test or as directed by your health care provider.  Do not apply lotions, powders, creams, or oils on your chest prior to the test.  Wear comfortable shoes and clothing. Let your health care provider know if you were unable to complete or follow the preparations for your test. PROCEDURE   Multiple patches (electrodes) will be put on your chest. If needed, small areas of your chest may be shaved to get better contact with the electrodes. Once the electrodes are attached to your body, multiple wires will be attached to the electrodes, and your heart rate will be monitored.  An IV access will be started. A nuclear trace (isotope) is given. The isotope may be given intravenously, or it may be swallowed. Nuclear refers to several types of radioactive isotopes, and the nuclear isotope lights up the arteries so that the nuclear images are clear. The isotope is absorbed by your body. This results in low radiation exposure.  A resting  nuclear image is taken to show how your heart functions at rest.  A medicine is given through the IV access.  A second scan is done about 1 hour after the medicine injection and determines how your heart functions under stress.  During this stress phase, you will be connected to an electrocardiogram machine. Your blood pressure and oxygen levels will be monitored. AFTER THE PROCEDURE   Your heart rate and blood pressure will be monitored after the test.  You may return to your normal schedule, including diet,activities, and medicines, unless your health care provider tells you otherwise. This information is not intended to replace advice given to you by your health care provider. Make sure you discuss any questions you have with your health care provider. Document Released:  01/20/2009 Document Revised: 09/08/2013 Document Reviewed: 05/11/2013 Elsevier Interactive Patient Education  2017 [employer].

## 2016-10-12 NOTE — Progress Notes (Signed)
Cardiology Office Note  Date:  10/12/2016   ID:  BEHNAM SHAWHAN, DOB Nov 04, 1950, MRN [id number]  PCP:  [name], MD   Chief Complaint  Patient presents with  . other    6 month follow up. Meds reviewed by the pt's med list. Pt. c/o chest tightness at times.     HPI:  Mr. Street is a pleasant 66 year old gentleman, patient of Dr. Clayborn Bigness with a history of COPD, coronary artery disease, diabetes type 2, long history of smoking,  diastolic CHF, hypertension, hyperlipidemia, who presents for follow-up of his coronary artery disease. He has been seen at [facility] for a moderately large abdominal aortic aneurysm as well as dilated iliac arteries. Previous episode of cough syncope, none in several years  Uses chronic oxygen at night, not on exertion  Continues to smoke 4 to 5 per day  he reports having worsening chest pain, shortness of breath on exertion Severe shortness of breath walking in his house to the bathroom, walks on room air This is a change, concerned it could be a blockage He is taking nitroglycerin periodically for chest tightness  Previous cardiac catheterization from 2010 reviewed with him showing diffuse mild disease, 50% mid left circumflex, 20% disease in the other vessels  Previous  CT ABd 09/2015 at [facility] with him in detail  completed AAA endovascular stent at [facility] reports -- Stable aortobiiliac endograft with decreased excluded aneurysm size. The previously identified type II endoleak is not well seen on today's study. -- Stable bilateral common iliac artery aneurysms. -- Stable anterior abdominal wall hernia containing nonobstructed small bowel, anterior bladder, and penile prosthesis reservoir.  He is seen by Dr. Humphrey Rolls , pulmonary, did 6 min walk, 94% Only oxygen at night, discussed with him in detail  Was on lipitor for years, just decided to stop this on his own, possibly having stomach issues with Lipitor   previously reported having problems on Crestor    EKG on today's visit shows normal sinus rhythm with rate 88 bpm, no significant ST or T-wave changes    other past medical history reviewed  long history of shortness of breath, on several inhalers  He has been seen at [facility] for a moderately large abdominal aortic aneurysm as well as dilated iliac arteries.   cardiac catheterization 01/18/2009.  Notes indicate he has proximal 20% RCA disease, 10% left main disease, 30 follow by 50% mid left circumflex disease, 20% OM1 disease, 20% OM 2 disease, 20% disease of a ramus branch, 20% mid LAD disease and 20% D1 disease. No intervention was performed at that time He does report a cardiac catheterization prior to that. No further cardiac catheterization or stress testing since 2010  Previously reported chest pain over the past several years. with arm pain, possible numbness in his arm  in the hospital 04/01/2014 for chest pain, shortness of breath. Chest x-ray showed scarring at the left base, lab work reviewed with him showing negative cardiac enzymes, normal renal function, elevated alcohol level 0.28, BNP 270, EKG with no significant ST or T-wave changes  Records below were researched and found on care anywhere, reviewed with the patient today Echocardiogram done at [facility] 07/28/2014 showing normal LV systolic function, dilated thoracic aorta 4.0 cm at the sinus of Valsalva, 3.6 cm above the sinotubular junction, LVH otherwise normal study  CT scan of the abdomen details a infrarenal abdominal aortic aneurysm arising at the level of the IMA measuring up to 4.8 x 4.3 cm, iliac artery aneurysm  2.6 on the right, 2.2 on the left  PMH:   has a past medical history of AAA (abdominal aortic aneurysm) ([place]); AAA (abdominal aortic aneurysm) ([place]); Anxiety; Asthma; CHF (congestive heart failure) ([place]); COPD (chronic obstructive pulmonary disease) ([place]); Coronary artery disease; Diabetes mellitus without complication ([place]); ESRD (end stage renal  disease) ([place]); Hyperlipidemia; Hypertension; Iliac artery aneurysm, bilateral ([place]); Obstructive sleep apnea-hypopnea syndrome; and Sleep apnea.  PSH:    Past Surgical History:  Procedure Laterality Date  . ABDOMINAL AORTIC ANEURYSM REPAIR  03/06/2015   [place]    . CARDIAC CATHETERIZATION  2010,03/16/2003,11/04/2002  . CORONARY ANGIOPLASTY  03/16/2003   stent to the RCA  & 2/18/2004stent mid circumflex  . ELBOW SURGERY    . HERNIA REPAIR    . PENILE PROSTHESIS IMPLANT  1995  . TONSILECTOMY, ADENOIDECTOMY, BILATERAL MYRINGOTOMY AND TUBES      Current Outpatient Prescriptions  Medication Sig Dispense Refill  . acetaminophen (TYLENOL) 500 MG tablet Take 500 mg by mouth every 6 (six) hours as needed.     Marland Kitchen albuterol (PROAIR HFA) 108 (90 BASE) MCG/ACT inhaler Inhale 2 puffs into the lungs every 6 (six) hours as needed.     Marland Kitchen allopurinol (ZYLOPRIM) 100 MG tablet Take 100 mg by mouth daily.     Marland Kitchen aspirin 81 MG tablet Take 162 mg by mouth daily.     Marland Kitchen atenolol (TENORMIN) 25 MG tablet Take 1 tablet (25 mg total) by mouth daily. 30 tablet 11  . cyclobenzaprine (FLEXERIL) 10 MG tablet Take 10 mg by mouth at bedtime.     Marland Kitchen doxycycline (VIBRA-TABS) 100 MG tablet Take 100 mg by mouth daily.     . Fluticasone-Salmeterol (ADVAIR DISKUS) 250-50 MCG/DOSE AEPB Inhale 1 puff into the lungs 2 (two) times daily. Frequency:BID   Dosage:0.0     Instructions:  Note:Dose: 250-50MCG    . isosorbide mononitrate (IMDUR) 60 MG 24 hr tablet Take 60 mg by mouth daily.     Marland Kitchen loratadine (CLARITIN) 10 MG tablet Take 10 mg by mouth daily as needed.     . metFORMIN (GLUCOPHAGE) 500 MG tablet Take 500 mg by mouth 2 (two) times daily with a meal.    . nitroGLYCERIN (NITROSTAT) 0.4 MG SL tablet Place 0.4 mg under the tongue.    . OXYGEN Inhale into the lungs. 2 liters @@ 24 hours daily.    Marland Kitchen tiotropium (SPIRIVA HANDIHALER) 18 MCG inhalation capsule Place 18 mcg into inhaler and inhale daily.  Frequency:QD   Dosage:18   MCG  Instructions:  Note:Dose: 18MCG    . traZODone (DESYREL) 100 MG tablet Take 100 mg by mouth at bedtime.      No current facility-administered medications for this visit.      Allergies:   Codeine   Social History:  The patient  reports that he has been smoking Cigarettes.  He has a 11.25 pack-year smoking history. He has never used smokeless tobacco. He reports that he drinks alcohol. He reports that he does not use drugs.   Family History:   family history includes Heart attack (age of onset: 75) in his father; Heart disease in his father; Hyperlipidemia in his father and mother; Hypertension in his father and mother.    Review of Systems: Review of Systems  Constitutional: Negative.   Respiratory: Positive for shortness of breath.   Cardiovascular: Positive for chest pain.  Gastrointestinal: Negative.   Musculoskeletal: Negative.   Neurological: Negative.   Psychiatric/Behavioral: Negative.  All other systems reviewed and are negative.    PHYSICAL EXAM: VS:  BP 110/60 (BP Location: Left Arm, Patient Position: Sitting, Cuff Size: Normal)   Pulse 88   Ht 5\' 9"  (1.753 m)   Wt 260 lb 4 oz (118 kg)   BMI 38.43 kg/m  , BMI Body mass index is 38.43 kg/m. GEN: Well nourished, well developed, in no acute distress  HEENT: normal  Neck: no JVD, carotid bruits, or masses Cardiac: RRR; no murmurs, rubs, or gallops,no edema  Respiratory:  moderately decreased breath sounds throughout, scant wheezing , normal work of breathing GI: soft, nontender, nondistended, + BS MS: no deformity or atrophy  Skin: warm and dry, no rash Neuro:  Strength and sensation are intact Psych: euthymic mood, full affect    Recent Labs: No results found for requested labs within last 8760 hours.    Lipid Panel No results found for: CHOL, HDL, LDLCALC, TRIG    Wt Readings from Last 3 Encounters:  10/12/16 260 lb 4 oz (118 kg)  04/02/16 255 lb 12 oz (116 kg)   03/25/15 260 lb (117.9 kg)       ASSESSMENT AND PLAN:  Coronary artery disease involving native coronary artery of native heart with angina pectoris with documented spasm ([place]) - I'm concerned about worsening coronary disease, last catheterization in 2010 Now with worsening chest pain, shortness of breath on exertion Suspect stable angina, stress test as below  Essential hypertension - Blood pressure is well controlled on today's visit. No changes made to the medications.  SOB (shortness of breath) - Cause of his shortness of breath is unclear Severe underlying COPD, also with coronary artery disease, obesity, deconditioning Stress test as below. If stress test shows no ischemia, could consider echocardiogram to evaluate right heart pressures  Chest tightness -  As below, we'll schedule for stress test. Various treatment options discussed with him, he is happy with stress testing  Stable angina (HCC) Worsening chest tightness, taking nitroglycerin, worsening shortness of breath concerning for ischemic disease. Known coronary artery disease, unable to treadmill. We will schedule him for a pharmacologic Myoview  Centrilobular emphysema ([place]) Followed by pulmonary, on 3 inhalers, worsening shortness of breath on exertion Reports he maintains oxygen greater than 90% with 6 minute walk test  Morbid obesity ([place]) We have encouraged continued careful diet management in an effort to lose weight. Unable to exercise as he is very short of breath on exertion  Smoker We have encouraged him to continue to work on weaning his cigarettes and smoking cessation. He will continue to work on this and does not want any assistance with chantix.   Long discussion concerning various treatment options for his shortness of breath including stress testing, cardiac catheterization, further long evaluation. After long discussion, he prefers stress testing for now.   Total encounter time more than 45  minutes  Greater than 50% was spent in counseling and coordination of care with the patient   Disposition:   F/U  6 months   Orders Placed This Encounter  Procedures  . NM Myocar Multi W/Spect W/Wall Motion / EF  . Hepatic function panel  . Lipid Profile  . EKG 12-Lead     Signed, Esmond Plants, M.D., Ph.D. 10/12/2016  [place], [place]

## 2016-10-25 ENCOUNTER — Encounter
Admission: RE | Admit: 2016-10-25 | Discharge: 2016-10-25 | Disposition: A | Discharge status: Home / Self Care | Payer: Medicare PPO | Source: Ambulatory Visit | Attending: Cardiovascular Disease | Admitting: Cardiovascular Disease

## 2016-10-25 ENCOUNTER — Other Ambulatory Visit
Admission: RE | Admit: 2016-10-25 | Discharge: 2016-10-25 | Disposition: A | Discharge status: Home / Self Care | Payer: Medicare PPO | Source: Ambulatory Visit | Attending: Cardiovascular Disease | Admitting: Cardiovascular Disease

## 2016-10-25 DIAGNOSIS — R0602 Shortness of breath: Secondary | ICD-10-CM | POA: Insufficient documentation

## 2016-10-25 DIAGNOSIS — I25111 Atherosclerotic heart disease of native coronary artery with angina pectoris with documented spasm: Secondary | ICD-10-CM | POA: Diagnosis not present

## 2016-10-25 DIAGNOSIS — R0789 Other chest pain: Secondary | ICD-10-CM

## 2016-10-25 DIAGNOSIS — I10 Essential (primary) hypertension: Secondary | ICD-10-CM | POA: Insufficient documentation

## 2016-10-25 DIAGNOSIS — I208 Other forms of angina pectoris: Secondary | ICD-10-CM | POA: Insufficient documentation

## 2016-10-25 LAB — NM MYOCAR MULTI W/SPECT W/WALL MOTION / EF
CHL CUP MPHR: 155 {beats}/min
CHL CUP RESTING HR STRESS: 75 {beats}/min
CHL CUP STRESS STAGE 1 SPEED: 0 mph
CHL CUP STRESS STAGE 2 GRADE: 0 %
CHL CUP STRESS STAGE 2 SPEED: 0 mph
CHL CUP STRESS STAGE 3 HR: 96 {beats}/min
CHL CUP STRESS STAGE 3 SPEED: 0 mph
CHL CUP STRESS STAGE 5 GRADE: 0 %
CSEPED: 0 min
CSEPEDS: 0 s
CSEPHR: 68 %
Estimated workload: 1 METS
LV dias vol: 102 mL (ref 62–150)
LV sys vol: 38 mL
Peak HR: 96 {beats}/min
Percent of predicted max HR: 61 %
SDS: 1
SRS: 0
SSS: 0
Stage 1 Grade: 0 %
Stage 1 HR: 75 {beats}/min
Stage 2 HR: 75 {beats}/min
Stage 3 Grade: 0 %
Stage 4 Grade: 0 %
Stage 4 HR: 100 {beats}/min
Stage 4 Speed: 0 mph
Stage 5 DBP: 65 mmHg
Stage 5 HR: 88 {beats}/min
Stage 5 SBP: 114 mmHg
Stage 5 Speed: 0 mph
TID: 0.88

## 2016-10-25 LAB — HEPATIC FUNCTION PANEL
ALBUMIN: 3.2 g/dL — AB (ref 3.5–5.0)
ALT: 28 U/L (ref 17–63)
AST: 45 U/L — AB (ref 15–41)
Alkaline Phosphatase: 68 U/L (ref 38–126)
BILIRUBIN DIRECT: 0.2 mg/dL (ref 0.1–0.5)
Indirect Bilirubin: 0.5 mg/dL (ref 0.3–0.9)
TOTAL PROTEIN: 8.1 g/dL (ref 6.5–8.1)
Total Bilirubin: 0.7 mg/dL (ref 0.3–1.2)

## 2016-10-25 LAB — LIPID PANEL
CHOL/HDL RATIO: 3.6 ratio
Cholesterol: 199 mg/dL (ref 0–200)
HDL: 56 mg/dL (ref >40)
LDL CALC: 120 mg/dL — AB (ref 0–99)
TRIGLYCERIDES: 114 mg/dL (ref <150)
VLDL: 23 mg/dL (ref 0–40)

## 2016-10-25 MED ORDER — TECHNETIUM TC 99M TETROFOSMIN IV KIT
12.0000 | PACK | Freq: Once | INTRAVENOUS | Status: AC | PRN
  Administered 2016-10-25: 13.257 via INTRAVENOUS

## 2016-10-25 MED ORDER — TECHNETIUM TC 99M TETROFOSMIN IV KIT
32.2140 | PACK | Freq: Once | INTRAVENOUS | Status: AC | PRN
  Administered 2016-10-25: 32.214 via INTRAVENOUS

## 2016-10-25 MED ORDER — REGADENOSON 0.4 MG/5ML IV SOLN
0.4000 mg | Freq: Once | INTRAVENOUS | Status: AC
  Administered 2016-10-25: 0.4 mg via INTRAVENOUS

## 2016-10-30 ENCOUNTER — Other Ambulatory Visit: Payer: Self-pay

## 2016-10-30 MED ORDER — ROSUVASTATIN CALCIUM 20 MG PO TABS: 20.0000 mg | ORAL_TABLET | Freq: Every day | ORAL | 6 refills | Status: DC

## 2016-12-17 ENCOUNTER — Telehealth: Payer: Self-pay | Admitting: Cardiovascular Disease

## 2016-12-17 NOTE — Telephone Encounter (Signed)
Pt reports myalgias & flu-like sx since starting Crestor 20 mg.  He has stopped taking this, but would like to know what Dr. Rockey Situ would like for him to take instead.

## 2016-12-17 NOTE — Telephone Encounter (Signed)
° °  Pt c/o medication issue:  1. Name of Medication: CRESTOR   2. How are you currently taking this medication (dosage and times per day)?  20 MG PO DAILY DC YESTERDAY   3. Are you having a reaction (difficulty breathing--STAT)? FLU LIKE SX ACHES AND PAINS ALL OVER   4. What is your medication issue?  Patient unable to tolerate crestor .  Patient went to lung doctor with flu like sx.   He was told to dc crestor and to call dr. Rockey Situ.  Patient stopped taking yesterday.  Please call

## 2016-12-17 NOTE — Telephone Encounter (Signed)
Does he want to retry lipitor? He was on this for years and stopped on his own

## 2016-12-18 NOTE — Telephone Encounter (Signed)
Pt is calling back regarding his message left yesterday. Please call.

## 2016-12-18 NOTE — Telephone Encounter (Signed)
Spoke w/ pt.  He states that he stopped the Lipitor for the same reason and does not want to try this again. He would like a medicine that will not cause joint pain, as he has so many health problems that he does not want to take something that will add to his pain. Advised him that I will make Dr. Rockey Situ aware and call him back w/ his recommendation.

## 2016-12-19 NOTE — Telephone Encounter (Signed)
Would consider trying simvastatin 20 mg daily Start every other day for 2-3 weeks then slowly up to daily Add coenzyme Q 10 For any muscle ache hold the medication for several days then restart  If he is able to tolerate very low-dose statin, We can supplement also with Zetia which is a non-statin, good for 10% drop in numbers

## 2016-12-20 MED ORDER — SIMVASTATIN 20 MG PO TABS: 20.0000 mg | ORAL_TABLET | Freq: Every day | ORAL | 6 refills | Status: DC

## 2016-12-20 NOTE — Telephone Encounter (Signed)
Spoke w/ pt's daughter.  Advised her of Dr. Donivan Scull recommendation.  She verbalizes understanding and will have pt call back if he is unable to tolerate.

## 2017-02-08 ENCOUNTER — Other Ambulatory Visit: Payer: Self-pay | Admitting: Pharmacist

## 2017-02-08 NOTE — Patient Outreach (Addendum)
Outreach call to Lawrence Steele to complete medication review for [facility] quality metric. Spoke with patient. HIPAA identifiers verified and verbal consent received.  Patient declines to review his medications with me at this time as he reports that he just reviewed his medications with a pharmacist in his PCP's office yesterday. Reports that he does have concerns about the cost of his medications and entering the donut hole later in the year. Patient reports that he is particularly concerned about the cost of his Ventolin, Advair and Spiriva. Reports that his income is too high to qualify for Extra Help. Review with patient the requirements of the [place] for you and [place] patient assistance programs. Patient reports that he meets the income requirement for the [place] for you program. Reports that he will have to look further to determine if he meets the requirements for the [place] program. Reports that he has currently spent over $600 out of pocket for the calendar year.  Counsel patient on the cost saving benefits of using Patent attorney.  Discuss with patient his medication adherence. Patient states that he misses a dose of his medications maybe once or twice a month. Reports that he keeps his medications on his breakfast table and takes his medications directly from the bottles. Reports that he has used a pillbox in the past. However, reports that he no longer uses this tool. Counsel patient on the importance of medication adherence and the benefits of using a pillbox to help him remember. Patient verbalizes understanding and states that he will try using the pillbox again.  Patient denies any further medication questions/concerns at this time. Provide patient with my phone number.  PLAN:  1) Will mail patient the applications for the [place] for you and [place] patient assistance programs.  2) If I have not heard back  from Mr. Eichorn within two weeks, I will call him at that time to follow up about the applications and, again, offer to perform a medication review for him.  Harlow Asa, PharmD, [place] [facility] [phone]

## 2017-02-22 ENCOUNTER — Other Ambulatory Visit: Payer: Self-pay | Admitting: Pharmacist

## 2017-02-22 NOTE — Patient Outreach (Signed)
Outreach call to Lawrence Steele to follow up about his patient assistance applications for Advair, Ventolin, and Spiriva. Spoke with patient. HIPAA identifiers verified and verbal consent received.  Lawrence Steele reports that he has received a lot of paperwork in the mail recently and was confused about which paperwork to fill out. Lawrence Steele takes out the paperwork and finds the Lawrence Steele and Lawrence Steele patient assistance applications. Verbally review with patient the parts of the applications to be completed and remind patient to also include a statement of his out of pocket prescription expenses for the calendar year, one of the financial documents listed on the [place] application and a copy of his prescription drug card. Lawrence Steele understanding via the teach-back method.   Lawrence Steele reports that he has not yet called [place] about using mail order for cost savings. Provide the patient with this phone number. Also provide patient with the number for the [facility] over the counter benefit line.  Lawrence Steele reports that he has been using his weekly pillbox to organize his medications and reports that this has been going okay.  Patient denies any further questions or concerns at this time. Confirms that he has the return envelope to mail the patient assistance applications to me. Patient confirms that he has my phone number.  PLAN:  1) Lawrence Steele to complete and mail back the [employer] and [employer] patient assistance applications.  2) If I have not heard back from Lawrence Steele/received his applications back in the mail by 03/04/17, will follow up with him again at that time.  Harlow Asa, PharmD, [place] [facility] [phone]

## 2017-03-04 ENCOUNTER — Other Ambulatory Visit: Payer: Self-pay | Admitting: Pharmacist

## 2017-03-04 NOTE — Patient Outreach (Signed)
Outreach call to Mr. Nestor to follow up about his patient assistance applications for Advair, Ventolin, and Spiriva. Spoke with patient. HIPAA identifiers verified and verbal consent received.  Mr. Frye reports that he has completed the applications and was getting ready to mail all of the paperwork back to me when he realized that he was out of stamps. Reports that he will get stamps and mail these papers back on Wednesday.  Plan  1) Mr. Westwood to mail back the [employer] and [employer] patient assistance applications.  2) If I have not heard back from Mr. Lupinacci/received his applications back in the mail by 03/13/17, will follow up with him again at that time.  Harlow Asa, PharmD, [place] [facility] [phone]

## 2017-03-13 ENCOUNTER — Other Ambulatory Visit: Payer: Self-pay | Admitting: Pharmacist

## 2017-03-13 NOTE — Patient Outreach (Addendum)
On Monday, 03/11/17, receive a call from [name] regarding his patient assistance applications for Advair, Ventolin, and Spiriva. Spoke with patient. HIPAA identifiers verified and verbal consent received.  [name] reports that he has now received several patient assistance applications in the mail from different sources and that he is now confused about which paperwork he is to return to me. Agree to meet with patient today at his home at 12:30 pm.  Meet with [name] at his home. Assist patient with completing his [place] patient assistance applications.  [name] reports that he stopped his simvastatin 3 months ago as directed by his PCP due to all over muscle pain. However, reports that this pain has not improved. Advise patient to follow up with PCP about muscle pain and about whether to now resume statin. Patient states that he will make this call.  Patient also reports that due to this pain, he has been taking Tylenol 500 mg - 3 tablets three times daily for the pain. Counseled patient about the dose and total daily dose limits on Tylenol and the importance of adherence to these limits in order to protect his liver. Patient verbalizes understanding.  PLAN  Fax patient's [place] and [place] patient assistance applications to his PCP's, Dr. Laurelyn Sickle, office to be completed.  Harlow Asa, PharmD, [place] [facility] [phone]

## 2017-03-25 ENCOUNTER — Other Ambulatory Visit: Payer: Self-pay | Admitting: Pharmacy Technician

## 2017-03-25 ENCOUNTER — Other Ambulatory Visit: Payer: Self-pay | Admitting: Pharmacist

## 2017-03-25 NOTE — Patient Outreach (Addendum)
Call to follow up with [facility] in Dr. Laurelyn Sickle office regarding the provider portion of the patient's applications for patient assistance for Advair, Ventolin, and Spiriva. Dan Europe states that Dr. Humphrey Rolls is in the office today and that she will have the provider sign these prescriptions and she will fax the applications in today.  Will follow up regarding these applications again next week.  Harlow Asa, PharmD, [place] [facility] [phone]

## 2017-03-25 NOTE — Patient Outreach (Signed)
[facility]) Care Management  03/25/2017  ENES ROKOSZ 1951/03/23 [id number]   I spoke to patient about Metformin medication adherence. Pt stated he is now taking 1 tablet twice daily. I also informed patient of Tier 1 and Tier 2 drugs of [employer] being free thru Mail Order and offered to help transfer scripts. Patient agreed for me to do so. I contacted [name] office and spoke to [place] about sending new 3 month scripts to [facility] for the following.Marland KitchenMarland KitchenAllopurinol, Atenolol, Metformin, Isosorbide Er, Trazodone and Tramadal, Nimisha stated she would very with Heather and send script in today (07/09). Pt informed me that he had D/C taking his Simvastatin due to muscle aches. Pt also stated that he recently restarted medication again. I advised patient to speak with Heather at next appt to see if his dose could be adjusted for it or to be tried on another drug.  Maud Deed Kingfisher, [place] [facility] [phone]

## 2017-03-30 ENCOUNTER — Emergency Department: Payer: Medicare PPO

## 2017-03-30 ENCOUNTER — Other Ambulatory Visit: Payer: Self-pay

## 2017-03-30 ENCOUNTER — Emergency Department
Admission: EM | Admit: 2017-03-30 | Discharge: 2017-03-31 | Disposition: A | Discharge status: Home / Self Care | Payer: Medicare PPO | Attending: Emergency Medicine | Admitting: Emergency Medicine

## 2017-03-30 DIAGNOSIS — R45851 Suicidal ideations: Secondary | ICD-10-CM | POA: Diagnosis present

## 2017-03-30 DIAGNOSIS — F3132 Bipolar disorder, current episode depressed, moderate: Secondary | ICD-10-CM | POA: Insufficient documentation

## 2017-03-30 DIAGNOSIS — I110 Hypertensive heart disease with heart failure: Secondary | ICD-10-CM | POA: Insufficient documentation

## 2017-03-30 DIAGNOSIS — N186 End stage renal disease: Secondary | ICD-10-CM | POA: Insufficient documentation

## 2017-03-30 DIAGNOSIS — Z7982 Long term (current) use of aspirin: Secondary | ICD-10-CM | POA: Diagnosis not present

## 2017-03-30 DIAGNOSIS — J45909 Unspecified asthma, uncomplicated: Secondary | ICD-10-CM | POA: Diagnosis not present

## 2017-03-30 DIAGNOSIS — I509 Heart failure, unspecified: Secondary | ICD-10-CM | POA: Insufficient documentation

## 2017-03-30 DIAGNOSIS — F1010 Alcohol abuse, uncomplicated: Secondary | ICD-10-CM | POA: Insufficient documentation

## 2017-03-30 DIAGNOSIS — F17210 Nicotine dependence, cigarettes, uncomplicated: Secondary | ICD-10-CM | POA: Insufficient documentation

## 2017-03-30 DIAGNOSIS — Z7984 Long term (current) use of oral hypoglycemic drugs: Secondary | ICD-10-CM | POA: Insufficient documentation

## 2017-03-30 DIAGNOSIS — I1311 Hypertensive heart and chronic kidney disease without heart failure, with stage 5 chronic kidney disease, or end stage renal disease: Secondary | ICD-10-CM | POA: Insufficient documentation

## 2017-03-30 DIAGNOSIS — J449 Chronic obstructive pulmonary disease, unspecified: Secondary | ICD-10-CM | POA: Insufficient documentation

## 2017-03-30 DIAGNOSIS — Z79899 Other long term (current) drug therapy: Secondary | ICD-10-CM | POA: Diagnosis not present

## 2017-03-30 DIAGNOSIS — E1122 Type 2 diabetes mellitus with diabetic chronic kidney disease: Secondary | ICD-10-CM | POA: Diagnosis not present

## 2017-03-30 LAB — URINE DRUG SCREEN, QUALITATIVE (ARMC ONLY)
Amphetamines, Ur Screen: NOT DETECTED
Barbiturates, Ur Screen: NOT DETECTED
Benzodiazepine, Ur Scrn: NOT DETECTED
Cannabinoid 50 Ng, Ur ~~LOC~~: NOT DETECTED
Cocaine Metabolite,Ur ~~LOC~~: NOT DETECTED
MDMA (Ecstasy)Ur Screen: NOT DETECTED
Methadone Scn, Ur: NOT DETECTED
OPIATE, UR SCREEN: NOT DETECTED
Phencyclidine (PCP) Ur S: NOT DETECTED
Tricyclic, Ur Screen: NOT DETECTED

## 2017-03-30 LAB — BASIC METABOLIC PANEL
Anion gap: 8 (ref 5–15)
BUN: 16 mg/dL (ref 6–20)
CHLORIDE: 101 mmol/L (ref 101–111)
CO2: 27 mmol/L (ref 22–32)
CREATININE: 0.77 mg/dL (ref 0.61–1.24)
Calcium: 8.8 mg/dL — ABNORMAL LOW (ref 8.9–10.3)
GFR calc non Af Amer: >60 mL/min (ref >60)
Glucose, Bld: 223 mg/dL — ABNORMAL HIGH (ref 65–99)
POTASSIUM: 4.2 mmol/L (ref 3.5–5.1)
SODIUM: 136 mmol/L (ref 135–145)

## 2017-03-30 LAB — CBC
HCT: 42.8 % (ref 40.0–52.0)
Hemoglobin: 15.1 g/dL (ref 13.0–18.0)
MCH: 38 pg — ABNORMAL HIGH (ref 26.0–34.0)
MCHC: 35.3 g/dL (ref 32.0–36.0)
MCV: 107.9 fL — ABNORMAL HIGH (ref 80.0–100.0)
PLATELETS: 141 10*3/uL — AB (ref 150–440)
RBC: 3.97 MIL/uL — AB (ref 4.40–5.90)
RDW: 14.6 % — AB (ref 11.5–14.5)
WBC: 8.9 10*3/uL (ref 3.8–10.6)

## 2017-03-30 LAB — TROPONIN I: Troponin I: <0.03 ng/mL (ref <0.03)

## 2017-03-30 LAB — ETHANOL: Alcohol, Ethyl (B): 352 mg/dL (ref <5)

## 2017-03-30 LAB — ACETAMINOPHEN LEVEL: ACETAMINOPHEN (TYLENOL), SERUM: 10 ug/mL (ref 10–30)

## 2017-03-30 LAB — SALICYLATE LEVEL

## 2017-03-30 MED ORDER — THIAMINE HCL 100 MG/ML IJ SOLN
Freq: Once | INTRAVENOUS | Status: DC
  Filled 2017-03-30: qty 1000

## 2017-03-30 NOTE — ED Notes (Signed)
BEHAVIORAL HEALTH ROUNDING  Patient sleeping: No.  Patient alert and oriented: yes  Behavior appropriate: Yes. ; If no, describe:  Nutrition and fluids offered: Yes  Toileting and hygiene offered: Yes  Sitter present: not applicable, Q 15 min safety rounds and observation.  Law enforcement present: Yes ODS  

## 2017-03-30 NOTE — ED Notes (Signed)
Pt has own eyeglasses, wood cane. Pt's belongings placed in bag, white socks, blue underwear, blue pair of shorts, white t shirt, white sneakers placed in labeled white belonging bag. Driver's license placed in bag as well. Bag given to [place], rn.

## 2017-03-30 NOTE — ED Provider Notes (Signed)
[facility] Emergency Department Provider Note   ____________________________________________   First MD Initiated Contact with Patient 03/30/17 2302     (approximate)  I have reviewed the triage vital signs and the nursing notes.   HISTORY  Chief Complaint Psychiatric Evaluation and Chest Pain    HPI Lawrence Steele is a 66 y.o. male brought to the ED by police under IVC for suicidal ideation. Patient admits he drank a pint of alcohol before calling police. Called police because he feels like his family members are stealing from him. Reportedly patient complained of shortness of breath and chest pain; he denies both of these to me. Patient does wear continuous oxygen for COPD. Denies active SI/HI/AH/VH. Eager for discharge home.Nitrostat fever, chills, abdominal pain, nausea, vomiting, diarrhea. Denies recent travel or trauma.   Past Medical History:  Diagnosis Date  . AAA (abdominal aortic aneurysm) ([place])   . AAA (abdominal aortic aneurysm) ([place])   . Anxiety   . Asthma   . CHF (congestive heart failure) ([place])   . COPD (chronic obstructive pulmonary disease) ([place])   . Coronary artery disease   . Diabetes mellitus without complication ([place])   . ESRD (end stage renal disease) ([place])   . Hyperlipidemia   . Hypertension   . Iliac artery aneurysm, bilateral ([place])   . Obstructive sleep apnea-hypopnea syndrome   . Sleep apnea     Patient Active Problem List   Diagnosis Date Noted  . Smoker 04/02/2016  . Pain in the chest 08/20/2014  . SOB (shortness of breath) 08/20/2014  . Coronary artery disease involving native coronary artery of native heart with angina pectoris with documented spasm ([place]) 08/20/2014  . Emphysema of lung ([place]) 08/20/2014  . Coronary artery disease involving native coronary artery of native heart with unstable angina pectoris ([place]) 08/20/2014  . AAA (abdominal aortic aneurysm) without rupture ([place]) 08/20/2014  . Iliac artery  aneurysm, bilateral ([place]) 08/20/2014  . Hyperlipidemia 08/20/2014  . Essential hypertension 08/20/2014  . Morbid obesity ([place]) 08/20/2014    Past Surgical History:  Procedure Laterality Date  . ABDOMINAL AORTIC ANEURYSM REPAIR  03/06/2015   [facility]    . CARDIAC CATHETERIZATION  2010,03/16/2003,11/04/2002  . CORONARY ANGIOPLASTY  03/16/2003   stent to the RCA  & 2/18/2004stent mid circumflex  . ELBOW SURGERY    . HERNIA REPAIR    . PENILE PROSTHESIS IMPLANT  1995  . TONSILECTOMY, ADENOIDECTOMY, BILATERAL MYRINGOTOMY AND TUBES      Prior to Admission medications   Medication Sig Start Date End Date Taking? Authorizing Provider  acetaminophen (TYLENOL) 500 MG tablet Take 500 mg by mouth every 6 (six) hours as needed.    Yes [provider]  albuterol (PROAIR HFA) 108 (90 BASE) MCG/ACT inhaler Inhale 2 puffs into the lungs every 6 (six) hours as needed.    Yes [provider]  allopurinol (ZYLOPRIM) 100 MG tablet Take 100 mg by mouth daily.  07/25/05  Yes [provider]  aspirin 81 MG tablet Take 81 mg by mouth daily.    Yes [provider]  atenolol (TENORMIN) 25 MG tablet Take 1 tablet (25 mg total) by mouth daily. 04/02/16  Yes Gollan, Kathlene November, MD  Fluticasone-Salmeterol (ADVAIR DISKUS) 250-50 MCG/DOSE AEPB Inhale 1 puff into the lungs 2 (two) times daily. Frequency:BID   Dosage:0.0     Instructions:  Note:Dose: 250-50MCG 11/01/04  Yes [provider]  isosorbide mononitrate (IMDUR) 60 MG 24  hr tablet Take 60 mg by mouth daily.    Yes [provider]  loratadine (CLARITIN) 10 MG tablet Take 10 mg by mouth daily as needed.    Yes [provider]  metFORMIN (GLUCOPHAGE) 500 MG tablet Take 500 mg by mouth 2 (two) times daily with a meal.   Yes [provider]  nitroGLYCERIN (NITROSTAT) 0.4 MG SL tablet Place 0.4 mg under the tongue.   Yes [provider]  tiotropium (SPIRIVA HANDIHALER) 18  MCG inhalation capsule Place 18 mcg into inhaler and inhale daily. Frequency:QD   Dosage:18   MCG  Instructions:  Note:Dose: 18MCG 01/22/05  Yes [provider]  traMADol (ULTRAM) 50 MG tablet Take 50 mg by mouth 2 (two) times daily as needed for pain.   Yes [provider]  traZODone (DESYREL) 100 MG tablet Take 100 mg by mouth at bedtime.  10/22/08  Yes [provider]  OXYGEN Inhale into the lungs. 2 liters @@ 24 hours daily.    [provider]  simvastatin (ZOCOR) 20 MG tablet Take 1 tablet (20 mg total) by mouth at bedtime. 12/20/16 03/20/17  Minna Merritts, MD    Allergies Codeine  Family History  Problem Relation Age of Onset  . Hypertension Mother   . Hyperlipidemia Mother   . Heart attack Father 78  . Heart disease Father   . Hypertension Father   . Hyperlipidemia Father     Social History Social History  Substance Use Topics  . Smoking status: Current Every Day Smoker    Packs/day: 0.25    Years: 45.00    Types: Cigarettes  . Smokeless tobacco: Never Used  . Alcohol use Yes     Comment: Social    Review of Systems  Constitutional: No fever/chills. Eyes: No visual changes. ENT: No sore throat. Cardiovascular: Denies chest pain. Respiratory: Denies shortness of breath. Gastrointestinal: No abdominal pain.  No nausea, no vomiting.  No diarrhea.  No constipation. Genitourinary: Negative for dysuria. Musculoskeletal: Negative for back pain. Skin: Negative for rash. Neurological: Negative for headaches, focal weakness or numbness. Psychiatric:Positive for intoxication. Currently denies SI.  ____________________________________________   PHYSICAL EXAM:  VITAL SIGNS: ED Triage Vitals  Enc Vitals Group     BP 03/30/17 2212 128/82     Pulse Rate 03/30/17 2212 99     Resp 03/30/17 2212 16     Temp 03/30/17 2212 98 F (36.7 C)     Temp Source 03/30/17 2212 Oral     SpO2 03/30/17 2212 94 %     Weight 03/30/17 2212 265 lb (120.2  kg)     Height 03/30/17 2212 5\' 10"  (1.778 m)     Head Circumference --      Peak Flow --      Pain Score 03/30/17 2211 4     Pain Loc --      Pain Edu? --      Excl. in [place]? --     Constitutional: Alert and oriented. Well appearing and in no acute distress. Eyes: Conjunctivae are normal. PERRL. EOMI. Head: Atraumatic. Nose: No congestion/rhinnorhea. Mouth/Throat: Mucous membranes are moist.  Oropharynx non-erythematous. Neck: No stridor.   Cardiovascular: Normal rate, regular rhythm. Grossly normal heart sounds.  Good peripheral circulation. Respiratory: Normal respiratory effort.  No retractions. Lungs CTAB. Wearing nasal cannula oxygen. Gastrointestinal: Soft and nontender. No distention. No abdominal bruits. No CVA tenderness. Musculoskeletal: No lower extremity tenderness nor edema.  No joint effusions. Neurologic:  Intoxicated. Normal  speech and language. No gross focal neurologic deficits are appreciated. MAEx4. Skin:  Skin is warm, dry and intact. No rash noted. Psychiatric: Mood and affect are normal. Speech and behavior are normal.  ____________________________________________   LABS (all labs ordered are listed, but only abnormal results are displayed)  Labs Reviewed  BASIC METABOLIC PANEL - Abnormal; Notable for the following:       Result Value   Glucose, Bld 223 (*)    Calcium 8.8 (*)    All other components within normal limits  CBC - Abnormal; Notable for the following:    RBC 3.97 (*)    MCV 107.9 (*)    MCH 38.0 (*)    RDW 14.6 (*)    Platelets 141 (*)    All other components within normal limits  ETHANOL - Abnormal; Notable for the following:    Alcohol, Ethyl (B) 352 (*)    All other components within normal limits  TROPONIN I  SALICYLATE LEVEL  ACETAMINOPHEN LEVEL  URINE DRUG SCREEN, QUALITATIVE ([facility] ONLY)   ____________________________________________  EKG  ED ECG REPORT I, Shiniqua Groseclose J, the attending physician, personally viewed and  interpreted this ECG.   Date: 03/30/2017  EKG Time: 2219  Rate: 89  Rhythm: normal EKG, normal sinus rhythm  Axis: Normal  Intervals:none  ST&T Change: Nonspecific  ____________________________________________  RADIOLOGY  Dg Chest Port 1 View  Result Date: 03/30/2017 CLINICAL DATA:  Shortness of breath EXAM: PORTABLE CHEST 1 VIEW COMPARISON:  04/01/2014 FINDINGS: Cardiomegaly with central vascular congestion. Minimal atelectasis or scarring at the left base. Aortic atherosclerosis. No pneumothorax. IMPRESSION: 1. Left basilar scarring 2. Cardiomegaly with mild central vascular congestion Electronically Signed   By: Donavan Foil M.D.   On: 03/30/2017 23:56    ____________________________________________   PROCEDURES  Procedure(s) performed: None  Procedures  Critical Care performed: No  ____________________________________________   INITIAL IMPRESSION / ASSESSMENT AND PLAN / ED COURSE  Pertinent labs & imaging results that were available during my care of the patient were reviewed by me and considered in my medical decision making (see chart for details).  66 year old male, intoxicated, brought under IVC for suicidal ideation. Patient currently denies SI/HI/AH/VH. Will maintain IVC pending [facility] psychiatry consult. Lab work and urinalysis are unremarkable. Will obtain chest x-ray, infuse banana bag.  Clinical Course as of Apr 01 451  Sun Mar 31, 2017  0403 Patient was evaluated by [facility] psychiatrist Dr. Lovena Neighbours who deems patient is psychiatrically stable for discharge and rescinded his IVC. Patient previously pulled out his IV. Has been hydrating orally. He is alert and ambulatory with steady gait. Strict return precautions given. Patient verbalizes understanding and agrees with plan of care.  [JS]    Clinical Course User Index [JS] Paulette Blanch, MD     ____________________________________________   FINAL CLINICAL IMPRESSION(S) / ED DIAGNOSES  Final diagnoses:    Bipolar affective disorder, currently depressed, moderate ([place])  Alcohol abuse      NEW MEDICATIONS STARTED DURING THIS VISIT:  New Prescriptions   No medications on file     Note:  This document was prepared using Dragon voice recognition software and may include unintentional dictation errors.    Paulette Blanch, MD 03/31/17 [phone]

## 2017-03-30 NOTE — ED Triage Notes (Signed)
Pt presents with [employer] with IVC papers pending for SI. Pt denies current SI. Pt states he drank a pint of ETOH prior to calling police. Pt also complains of shob and chest pain. Pt states he wears oxygen 2lpm chronically. Pt occasionally raising fist at RN and tech in triage. Pt states people are stealing from him and that is why he called police tonight.

## 2017-03-31 ENCOUNTER — Encounter: Payer: Self-pay | Admitting: Emergency Medicine

## 2017-03-31 NOTE — ED Notes (Signed)
SOC MD called and states pt does not meet criteria for admission and can be discharged. Will send reversal of IVC. Dr Beather Arbour informed.

## 2017-03-31 NOTE — ED Notes (Signed)
BEHAVIORAL HEALTH ROUNDING Patient sleeping: Yes.   Patient alert and oriented: not applicable SLEEPING Behavior appropriate: Yes.  ; If no, describe: SLEEPING Nutrition and fluids offered: No SLEEPING Toileting and hygiene offered: NoSLEEPING Sitter present: not applicable, Q 15 min safety rounds and observation. Law enforcement present: Yes ODS 

## 2017-03-31 NOTE — ED Notes (Signed)
BEHAVIORAL HEALTH ROUNDING  Patient sleeping: No.  Patient alert and oriented: yes  Behavior appropriate: Yes. ; If no, describe:  Nutrition and fluids offered: Yes  Toileting and hygiene offered: Yes  Sitter present: not applicable, Q 15 min safety rounds and observation.  Law enforcement present: Yes ODS  

## 2017-03-31 NOTE — Discharge Instructions (Signed)
Drink alcohol only in moderation. Return to the ER for worsening symptoms, feelings of hurting yourself or others, or other concerns.

## 2017-03-31 NOTE — ED Notes (Signed)
In to see pt about reinserting IV in order to give multivitamin bag. Pt states "I'm going to go home...drink some water". MD Beather Arbour made aware of pt's refusal.

## 2017-03-31 NOTE — ED Notes (Signed)
Report given to [facility] MD. Computer placed in room and explained to pt who states he is going home one way or the other.

## 2017-03-31 NOTE — ED Notes (Signed)
Pt stood by bed and used urinal. Pt stating he wants to go home. Explained to pt that he had to stay for further evaluation due to IVC.

## 2017-03-31 NOTE — ED Notes (Signed)
Pt given sandwich tray and drink. 

## 2017-03-31 NOTE — ED Notes (Signed)
IV explained to pt and pt agreeable to have IV.

## 2017-04-01 ENCOUNTER — Other Ambulatory Visit: Payer: Self-pay | Admitting: Pharmacist

## 2017-04-01 NOTE — Patient Outreach (Signed)
Receive a voicemail from the patient requesting a call back. Left a HIPAA compliant message on the patient's voicemail. If have not heard from patient by 04/03/17, will give him another call at that time.  Harlow Asa, PharmD, [place] [facility] [phone]

## 2017-04-03 ENCOUNTER — Other Ambulatory Visit: Payer: Self-pay | Admitting: Pharmacist

## 2017-04-03 NOTE — Patient Outreach (Signed)
Call to Lawrence Steele for follow up about his patient assistance applications. Spoke with patient. HIPAA identifiers verified and verbal consent received.  Lawrence Steele reports that he is wondering about whether or not he should order refills of his medications. Reports that when he spoke with [facility] pharmacy technician, Lawrence Steele, she had contacted his doctor's office to help to him to get several of his prescriptions through mail order. Reports that he has not heard from mail order and is wondering whether these will be coming. Call [place] with Lawrence Steele on the line. Confirm with Lawrence Steele with [facility] Mail order that the patient's allopurinol, atenolol, metformin, isosorbide Er, trazodone and tramadol have been sent and should arrive within 5-7 days. Lawrence Steele confirms that he has enough medication to last until then.  Lawrence Steele reports that he has not yet heard back about either his [place] or [place] patient assistance applications. Offer to call with patient now to check the status of these applications.  Call [place] with Lawrence Steele on the phone. The representative confirms that Lawrence Steele's application has been denied based on his income. Receive instructions from representative on how to submit an appeal to this denial once he receives his application back in the mail.  Patient calls the [place] patient assistance line and finds out that this application for assistance with Advair and Ventolin has been approved. Reports that the representative tells him that a 90 day supply of the medications was shipped yesterday.  Lawrence Steele denies having any further medication questions/concerns at this time. Patient confirms that he has my phone number. Patient expresses appreciation for our help.  PLAN:  1) Lawrence Steele to call me when he receives the application denial letter back from [employer] so that we can send an  appeal.  2) If I have not heard from the patient by 04/12/17, will call to follow up again at that time.  Harlow Asa, PharmD, [facility] [phone]

## 2017-04-12 ENCOUNTER — Other Ambulatory Visit: Payer: Self-pay | Admitting: Pharmacist

## 2017-04-12 DIAGNOSIS — Z599 Problem related to housing and economic circumstances, unspecified: Secondary | ICD-10-CM

## 2017-04-12 DIAGNOSIS — Z598 Other problems related to housing and economic circumstances: Secondary | ICD-10-CM
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
## 2017-04-12 NOTE — Patient Outreach (Signed)
Receive a call back from Lawrence Steele. Called and spoke with patient. HIPAA identifiers verified and verbal consent received.  Lawrence Steele reports that he spoke with Lawrence Steele and that the clinic is open on Mondays, Wednesdays and Fridays. Discuss with patient finding a bus route to the clinic. Encourage patient to call back and schedule an appointment for Monday. Patient asks what day of the week it is today. Let patient know that it is Friday. Patient lets me know that he has been drinking. Patient states that he is not sure if he will be alive on Monday to make the appointment. Ask patient about whether he is planning/thinking about hurting himself. Patient states that he is not going to hurt himself; states "I won't have to". When I ask about this, patient does not give a clear answer. Patient states that he has taken more than one dose of trazodone today "to help with sleep". Talk to patient about the importance of taking his medication as directed and concerns about his alcohol consumption.   Lawrence Steele states that he will make an appointment for Monday with [facility] and that he will talk to me next week. Patient denies any further questions/concerns.   Call patient's PCP due to concern about patient's safety.  Call [facility] regarding patient's statement that he is not sure that he will be alive on Monday. Call patient to let him know that I have called. Patient does not answer and his voicemail box is not yet setup.   Emergency Services go to patient's home. Receive calls from emergency services letting me know that they are at the patient's home and speaking with him through the door.   Place a coordination of care call to Social Worker [employer].  PLAN:  1) Will place follow up call to patient within the next 24 hours.  2) Will send a referral to Education officer, museum.  Harlow Asa, PharmD, [place] [facility] [phone]

## 2017-04-13 ENCOUNTER — Other Ambulatory Visit: Payer: Self-pay | Admitting: Pharmacist

## 2017-04-13 NOTE — Patient Outreach (Addendum)
Outreach call to [name]. Patient does not answer and reach a message that patient has a voicemail box that has not yet been setup. Will outreach to [name] again next week.Harlow Asa, PharmD, [facility] [phone]

## 2017-04-15 ENCOUNTER — Other Ambulatory Visit: Payer: Self-pay | Admitting: *Deleted

## 2017-04-15 ENCOUNTER — Ambulatory Visit: Payer: Self-pay | Admitting: Pharmacist
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
## 2017-04-17 ENCOUNTER — Other Ambulatory Visit: Payer: Self-pay | Admitting: Pharmacist

## 2017-04-17 NOTE — Patient Outreach (Addendum)
Receive a voicemail from Mr. Viar requesting a call back. Called and spoke with patient. HIPAA identifiers verified and verbal consent received.  Mr. Jeanpaul reports that he is doing well today. Reports that when we last spoke, he had been drinking with a friend. Reports that he has been getting sleep for the past three days and that he is feeling better. Reports that he has not yet scheduled at visit with [facility]. Provide patient with this phone number again. Discuss with patient calling for an appointment and calling to arrange his transportation. Patient states that he will call to schedule today.  Mr. Broughton reports that he is looking forward to meeting with [place] tomorrow morning at 9 am.  Reports that he has not yet received the denial letter that he was anticipating receiving back for his application for [place] patient assistance. Call the [place] assistance program with Mr. Pattison on the line. [place] representative reports that Mr. Turnbough should be receiving his application with the denial letter back by this week. States that if the patient does not receive this in the mail, in order to appeal the denial, he will have to resend his application and the prescription from his provider, along with his appeal letter. Mr. Splawn reports that he will go ahead and draft this appeal letter.  Mr. Stettner denies any further questions or concerns at this time.  PLAN  1) If have not heard back from Mr. Heuerman in 1 week, will follow up at that time.  Harlow Asa, PharmD, [place] [facility] [phone]

## 2017-04-18 ENCOUNTER — Encounter: Payer: Self-pay | Admitting: *Deleted

## 2017-04-18 ENCOUNTER — Ambulatory Visit: Payer: Self-pay | Admitting: *Deleted

## 2017-04-18 ENCOUNTER — Other Ambulatory Visit: Payer: Self-pay | Admitting: *Deleted

## 2017-04-18 NOTE — Patient Outreach (Signed)
[place] [facility]) Care Management  04/18/2017  Lawrence Steele 1951-08-21 [id number]   Phone call to patient to re-schedule home visit due to computer issues. Home visit re-scheduled for 04/19/17 at [place], [place] [facility] [phone]

## 2017-04-19 ENCOUNTER — Encounter: Payer: Self-pay | Admitting: *Deleted

## 2017-04-19 ENCOUNTER — Other Ambulatory Visit: Payer: Self-pay | Admitting: *Deleted

## 2017-04-19 NOTE — Patient Outreach (Signed)
[name]) Care Management  [facility] Social Work  04/19/2017  Lawrence Steele 1950/09/29 [id number]  Subjective:  Patient is a 66 year old male referred to this social worker due to issues of depression and financial difficulty. Patient contacted by phone today due to this social worker experiencing computer problems.Per patient, has mood has improved and admits to have been drinking the day he received the phone call from the Pharmacist. Patient confirms that he was not taken to the ED, however they did come to his home for a Wellness Check. Patient states that he has a long history of depression and substance use. Patient discussed a history of behavioral health inpatient stays for thoughts of self harm and substance abuse treatment.. Currently patient is refusing follow up with outpatient behavioral health stating that he cannot afford the co-pay. Patient states that when he really get's down he either goes to sleep or calls his daughter to come over. Per patient, the main cause of his depression is his finances. He was getting a check from his previous job in addition to his social security disability, however that ended in July. Now he lives on his social security alone.  He lives in a home that is not insulated and his electric bill is often over $200.00 per month. Per patient, the home cannot be insulated. He plans to move in about a year but is very particular about where he resides. He does not like to be around a lot of people. Patient not interested in [place] at this time. Per patient he is receiving Meals on Wheels, and uses [employer] to get to his doctors appointments or his daughter assists when she can. Patient's daughter also assists with housekeeping, meals and will write out his money orders to pay the bills. Patient reports currently receiving assistance with applying for available medication assistance programs with [facility] Pharmacist. Patient  reports interest in receiving information regarding food banks  Objective:   Encounter Medications:  Outpatient Encounter Prescriptions as of 04/19/2017  Medication Sig  . acetaminophen (TYLENOL) 500 MG tablet Take 500 mg by mouth every 6 (six) hours as needed.   Marland Kitchen albuterol (PROAIR HFA) 108 (90 BASE) MCG/ACT inhaler Inhale 2 puffs into the lungs every 6 (six) hours as needed.   Marland Kitchen allopurinol (ZYLOPRIM) 100 MG tablet Take 100 mg by mouth daily.   Marland Kitchen aspirin 81 MG tablet Take 81 mg by mouth daily.   Marland Kitchen atenolol (TENORMIN) 25 MG tablet Take 1 tablet (25 mg total) by mouth daily.  . Fluticasone-Salmeterol (ADVAIR DISKUS) 250-50 MCG/DOSE AEPB Inhale 1 puff into the lungs 2 (two) times daily. Frequency:BID   Dosage:0.0     Instructions:  Note:Dose: 250-50MCG  . isosorbide mononitrate (IMDUR) 60 MG 24 hr tablet Take 60 mg by mouth daily.   Marland Kitchen loratadine (CLARITIN) 10 MG tablet Take 10 mg by mouth daily as needed.   . metFORMIN (GLUCOPHAGE) 500 MG tablet Take 500 mg by mouth 2 (two) times daily with a meal.  . nitroGLYCERIN (NITROSTAT) 0.4 MG SL tablet Place 0.4 mg under the tongue.  . OXYGEN Inhale into the lungs. 2 liters @@ 24 hours daily.  Marland Kitchen tiotropium (SPIRIVA HANDIHALER) 18 MCG inhalation capsule Place 18 mcg into inhaler and inhale daily. Frequency:QD   Dosage:18   MCG  Instructions:  Note:Dose: 18MCG  . traMADol (ULTRAM) 50 MG tablet Take 50 mg by mouth 2 (two) times daily as needed for pain.  . traZODone (DESYREL) 100  MG tablet Take 100 mg by mouth at bedtime.   . simvastatin (ZOCOR) 20 MG tablet Take 1 tablet (20 mg total) by mouth at bedtime.   No facility-administered encounter medications on file as of 04/19/2017.     Functional Status:  In your present state of health, do you have any difficulty performing the following activities: 04/19/2017  Hearing? N  Vision? Y  Difficulty concentrating or making decisions? Y  Walking or climbing stairs? Y  Dressing or bathing? N  Doing errands,  shopping? Y  Preparing Food and eating ? Y  Using the Toilet? N  In the past six months, have you accidently leaked urine? Y  Do you have problems with loss of bowel control? Y  Managing your Medications? N  Managing your Finances? Y  Comment daughter assist with filling out money orders to pay bills and put's them in the mail  Housekeeping or managing your Housekeeping? Y  Comment daughter assist with housekeeping  Some recent data might be hidden    Fall/Depression Screening:  PHQ 2/9 Scores 04/19/2017  PHQ - 2 Score 2  PHQ- 9 Score 5    Assessment:  Patient has a long history of depression and is currently declining follow up with a outpatient therapist stating that he cannot afford the co-pay. Patient states that he is feeling much better and regrets drinking the day he made those comments regarding not caring if he lived or died.  Patient denies thoughts of self harm at this time .  Patient receives Meals on Wheels, uses [employer] to get to medical appointments.  Patient's daughter is his main support as she assists with housekeeping, cooking meals and bill payments when she can. Patient's major stressor is his finances and feeling like he runs out of money before he can cover his bills.  Plan:  This Education officer, museum will provide patient with community resources for food banks as well as consumer credit counseling to assist with Doctor, hospital.    Sheralyn Boatman [facility] Care Management [phone]

## 2017-04-22 ENCOUNTER — Other Ambulatory Visit: Payer: Self-pay | Admitting: Pharmacist

## 2017-04-22 NOTE — Patient Outreach (Addendum)
Incoming call from Mr. Koker. Patient lets me know that he received his application back from [employer] assistance program along with a denial letter for this assistance.   Patient reports that he has already written a letter explaining his need for assistance for the appeal and that over the weekend he obtained a new out of pocket expense report from his pharmacy. Mr. Moravek states that he will mail this new paperwork along with his original application and Spiriva prescription back to [facility] today.   Mr. Locklin confirms that he currently has a Spiriva inhaler.  Mr. Hangartner reports that he has not yet scheduled an appointment with [place]. Reports that he will call again today and schedule an appointment. Patient confirms that he has this number.  Mr. Kittel denies any further medication questions/concerns at this time.   PLAN:  1) Will call to follow up with Mr. Dun on Friday.  Harlow Asa, PharmD, [place] Management [phone]

## 2017-04-24 ENCOUNTER — Ambulatory Visit: Payer: Self-pay | Admitting: Pharmacist

## 2017-04-26 ENCOUNTER — Other Ambulatory Visit: Payer: Self-pay | Admitting: Pharmacist

## 2017-04-26 ENCOUNTER — Other Ambulatory Visit: Payer: Self-pay | Admitting: *Deleted

## 2017-04-26 NOTE — Patient Outreach (Signed)
[place] [facility]) Care Management  04/26/2017  OLUWAFEMI VILLELLA [birth year] [id number]   Phone call to patient  to schedule home visit.  Community resources for area Customer service manager counseling to be discussed and provided. Contact information for the  behavioral health crisis line to be discussed as well.    Sheralyn Boatman [facility] Care Management [phone]

## 2017-04-26 NOTE — Patient Outreach (Signed)
Call to follow up with [name] regarding his application with [place] assistance program.   Patient reports that he mailed his appeal letter, a new out of pocket expense report from his pharmacy along with his original application and Spiriva prescription back to [employer] on Monday.  Call [place] assistance program with the patient on the line. Representative reports that the application has not yet been received and suggests that we call back next Friday.  [name] reports that he is planning to go to [facility] during their walk in hours and that he will call to arrange transportation.   [name] denies any further medication questions/concerns at this time.   PLAN:  1) Will call to follow up with [name] on 05/03/17.  Harlow Asa, PharmD, [place] Management [phone]

## 2017-04-30 ENCOUNTER — Other Ambulatory Visit: Payer: Self-pay | Admitting: *Deleted

## 2017-04-30 NOTE — Patient Outreach (Addendum)
[place] [facility]) Care Management  [facility] Social Work  04/30/2017  Lawrence Steele 1951/07/16 [id number]  Subjective:  Patient is a 66 year old male, referred to this social worker due to symptoms of depression and financial difficulty. Per patient, he denies thoughts of self harm at this time.he has contacted [facility] behavioral health and understands that the initial intake appointments are done by walk in only Monday, Wednesday abd Fridays. Per patient, he plans on following up there and will probobly get his daughter to take him.Patient states that he continues to receive Meals on Wheels. However  his daughter is very supportive and  cook meals , helps with housekeeping and  Transportation. Patient's discussed that his  home is not insulated and he often experienced high energy bills. Patient plans to move next spring.  Objective:   Encounter Medications:  Outpatient Encounter Prescriptions as of 04/30/2017  Medication Sig  . acetaminophen (TYLENOL) 500 MG tablet Take 500 mg by mouth every 6 (six) hours as needed.   Marland Kitchen albuterol (PROAIR HFA) 108 (90 BASE) MCG/ACT inhaler Inhale 2 puffs into the lungs every 6 (six) hours as needed.   Marland Kitchen allopurinol (ZYLOPRIM) 100 MG tablet Take 100 mg by mouth daily.   Marland Kitchen aspirin 81 MG tablet Take 81 mg by mouth daily.   Marland Kitchen atenolol (TENORMIN) 25 MG tablet Take 1 tablet (25 mg total) by mouth daily.  . Fluticasone-Salmeterol (ADVAIR DISKUS) 250-50 MCG/DOSE AEPB Inhale 1 puff into the lungs 2 (two) times daily. Frequency:BID   Dosage:0.0     Instructions:  Note:Dose: 250-50MCG  . isosorbide mononitrate (IMDUR) 60 MG 24 hr tablet Take 60 mg by mouth daily.   Marland Kitchen loratadine (CLARITIN) 10 MG tablet Take 10 mg by mouth daily as needed.   . metFORMIN (GLUCOPHAGE) 500 MG tablet Take 500 mg by mouth 2 (two) times daily with a meal.  . nitroGLYCERIN (NITROSTAT) 0.4 MG SL tablet Place 0.4 mg under the tongue.  . OXYGEN Inhale into the lungs. 2 liters @@ 24 hours  daily.  Marland Kitchen tiotropium (SPIRIVA HANDIHALER) 18 MCG inhalation capsule Place 18 mcg into inhaler and inhale daily. Frequency:QD   Dosage:18   MCG  Instructions:  Note:Dose: 18MCG  . traMADol (ULTRAM) 50 MG tablet Take 50 mg by mouth 2 (two) times daily as needed for pain.  . traZODone (DESYREL) 100 MG tablet Take 100 mg by mouth at bedtime.   . simvastatin (ZOCOR) 20 MG tablet Take 1 tablet (20 mg total) by mouth at bedtime.   No facility-administered encounter medications on file as of 04/30/2017.     Functional Status:  In your present state of health, do you have any difficulty performing the following activities: 04/19/2017  Hearing? N  Vision? Y  Difficulty concentrating or making decisions? Y  Walking or climbing stairs? Y  Dressing or bathing? N  Doing errands, shopping? Y  Preparing Food and eating ? Y  Using the Toilet? N  In the past six months, have you accidently leaked urine? Y  Do you have problems with loss of bowel control? Y  Managing your Medications? N  Managing your Finances? Y  Comment daughter assist with filling out money orders to pay bills and put's them in the mail  Housekeeping or managing your Housekeeping? Y  Comment daughter assist with housekeeping  Some recent data might be hidden    Fall/Depression Screening:  PHQ 2/9 Scores 04/19/2017  PHQ - 2 Score 2  PHQ- 9 Score 5  Assessment:  EMMI's on depression reviewed, follow up with [facility] behavioral encouraged. Resources for [employer]  provided, [employer] [place] [phone], [place] [phone]. Affordable housing resources also provided.  Patient very friendly, welcoming. Home clean and neat but very dark. Patient walked with a unsteady gait.  Plan:  This Education officer, museum will follow up with patient in 1 month in regards to resources provided and their use.   Sheralyn Boatman [facility] Care Management [phone]

## 2017-05-03 ENCOUNTER — Other Ambulatory Visit: Payer: Self-pay | Admitting: Pharmacist

## 2017-05-03 NOTE — Patient Outreach (Signed)
Call to follow up with [name] regarding his application with [place] assistance program.   [employer] assistance program with the patient on the line. Representative reports that the application appeal has been received and suggests that we call back next Friday to check the status.  Reports that he got a refill of his Spiriva this past Wednesday.  Lawrence Steele denies any further medication questions/concerns at this time.   PLAN:   Will call to follow up with Lawrence Steele again on 05/10/17.  Harlow Asa, PharmD, [place] Management [phone]

## 2017-05-10 ENCOUNTER — Other Ambulatory Visit: Payer: Self-pay | Admitting: Pharmacist

## 2017-05-10 NOTE — Patient Outreach (Signed)
Call to follow up with [name] regarding his application with [place] assistance program. HIPAA identifiers verified and verbal consent received.  Again call [place] assistance program with the patient on the line. Representative reports that the application appeal was denied because a newer version of the application exists. Representative has no explanation for why it is being denied based on this reason now, but this reason was not mentioned with the previous denial. States that the application is being mailed back to the patient again and that the patient and provider must now re-complete the paperwork and re-submit it.  Lawrence Steele denies any further medication questions/concerns at this time.   PLAN:  Will meet with Lawrence Steele at his home on Wednesday, 8/29 at 10 am to re-complete his portion of this paperwork.  Harlow Asa, PharmD, [place] Management [phone]

## 2017-05-13 ENCOUNTER — Other Ambulatory Visit: Payer: Self-pay | Admitting: Pharmacist

## 2017-05-13 NOTE — Patient Outreach (Signed)
Call to [facility] in Dr. Laurelyn Sickle office regarding patient assistance. Note that per office, Lawrence Steele is out of the office until Wednesday. Leave Lawrence Steele a voicemail letting her know that patient was approved for the [place] patient assistance program and has been receiving the medication through this program. Ask that Dr. Humphrey Rolls consider prescribing patient the Incruse inhaler, as an alternative to Spiriva, as this medication is manufactured by [employer]. Let Lawrence Steele know that if Dr. Humphrey Rolls is willing to switch the patient from Spiriva to Incruse to receive the medication through the assistance program, the prescription can simply be faxed to the [place] patient assistance program at [phone]. Request a call back from [place]. If have not heard back by 05/17/17, will call to follow up again at that time.  Harlow Asa, PharmD, [place] Management [phone]

## 2017-05-15 ENCOUNTER — Ambulatory Visit: Payer: Self-pay | Admitting: Pharmacist

## 2017-05-17 ENCOUNTER — Ambulatory Visit: Payer: Self-pay | Admitting: Pharmacist

## 2017-05-17 ENCOUNTER — Other Ambulatory Visit: Payer: Self-pay | Admitting: Pharmacist

## 2017-05-17 NOTE — Patient Outreach (Signed)
Call again to [facility] in Dr. Laurelyn Sickle office regarding patient assistance. Again let Dan Europe know that patient was approved for the [place] patient assistance program and has been receiving the medication through this program. Ask that Dr. Humphrey Rolls consider prescribing patient the Incruse inhaler, as an alternative to Spiriva, as this medication is manufactured by [employer]. Let Dan Europe know that if Dr. Humphrey Rolls is willing to switch the patient from Spiriva to Incruse to receive the medication through the assistance program, the prescription can simply be faxed to the [place] patient assistance program at [phone]. Dan Europe reports that Dr. Humphrey Rolls is out of the office until Tuesday. Request a call back from [place]. If have not heard back by 05/22/17, will call to follow up again at that time.  Harlow Asa, PharmD, [place] Management [phone]

## 2017-05-21 ENCOUNTER — Ambulatory Visit: Payer: Self-pay | Admitting: *Deleted

## 2017-05-22 ENCOUNTER — Ambulatory Visit: Payer: Self-pay | Admitting: Pharmacist

## 2017-05-22 ENCOUNTER — Other Ambulatory Visit: Payer: Self-pay | Admitting: Pharmacist

## 2017-05-22 NOTE — Patient Outreach (Signed)
Call again to [facility] in Dr. Laurelyn Sickle office regarding patient assistance. Again let Dan Europe know that patient was approved for the [place] patient assistance program and has been receiving the medication through this program. Ask that Dr. Humphrey Rolls consider prescribing patient the Incruse inhaler, as an alternative to Spiriva, as this medication is manufactured by [employer]. Let Dan Europe know that if Dr. Humphrey Rolls is willing to switch the patient from Spiriva to Incruse to receive the medication through the assistance program, the prescription can simply be faxed to the [place] patient assistance program at [phone]. Dan Europe reports that Dr. Humphrey Rolls is now out of the office until Thursday. Request a call back from [place]. If have not heard back by 05/24/17, will call to follow up again at that time.  Harlow Asa, PharmD, [place] Management [phone]

## 2017-05-24 ENCOUNTER — Other Ambulatory Visit: Payer: Self-pay | Admitting: Pharmacist

## 2017-05-24 NOTE — Patient Outreach (Signed)
Call again to follow up with Dan Europe in Dr. Laurelyn Sickle office regarding the patient's inhaler therapy and whether the provider is willing to switch the patient from Spiriva to Incruse in order for the patient to be able to receive the medication through the [place] patient assistance program. Leave a message for [place]. If have not heard back from [place] by next week, will follow up again at that time.  Harlow Asa, PharmD, [place] Management [phone]

## 2017-05-24 NOTE — Patient Outreach (Signed)
Call to follow up with Mr. Mignone regarding patient assistance.Let him know that I have left messages for Dr. Humphrey Rolls with Dan Europe in the office asking that Dr. Humphrey Rolls consider prescribing patient the Incruse inhaler, as an alternative to Spiriva, as this medication is manufactured by [employer]. Let Mr. Pingree know that I will follow up with him again as soon as I have heard back from Dr. Laurelyn Sickle office.  Mr. [place] denies any further medication questions/concerns at this time.  Harlow Asa, PharmD, [place] Management [phone]

## 2017-05-29 ENCOUNTER — Other Ambulatory Visit: Payer: Self-pay | Admitting: Pharmacist
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
## 2017-05-29 NOTE — Patient Outreach (Signed)
Call again to follow up with Dan Europe in Dr. Laurelyn Sickle office regarding the patient's inhaler therapy and whether the provider is willing to switch the patient from Spiriva to Incruse in order for the patient to be able to receive the medication through the [place] patient assistance program. Leave a message for [place]. Will also call to follow up with the patient to request that he call the office.   Harlow Asa, PharmD, [place] Management [phone]

## 2017-05-31 ENCOUNTER — Other Ambulatory Visit: Payer: Self-pay | Admitting: *Deleted

## 2017-05-31 ENCOUNTER — Other Ambulatory Visit: Payer: Self-pay | Admitting: Pharmacist

## 2017-05-31 NOTE — Patient Outreach (Signed)
05/30/17: received a voicemail from [place] in patient's PCP's office. Kennyth Lose let me know that Dr. Humphrey Rolls agreed to switch the patient from Spiriva inhaler to Incruse in order to allow him to get the inhaler through the [place] patient assistance program. Also received a voicemail from Mr. Rubinstein letting me know that Kennyth Lose had called to let him know. I called Kennyth Lose back and left her a voicemail providing her with the fax number for the [place] patient assistance program so that she could send in the prescription for the patient. I called Mr. Mahon back to update him.  05/31/17, today: I received a call from Mr. Trueba. HIPAA identifiers verified and verbal consent received. Patient lets me know that he called the [place] patient assistance program to have his Advair and Ventolin inhalers renewed. Reports that he asked about the Incruse inhaler prescription and was told that it has not yet been received. Let Mr. Lobello know that I placed a call to Dr. Laurelyn Sickle office today, but the office is now closed. Mr. Betancur reports that he will call to follow up with Dr. Laurelyn Sickle office about the Incruse prescription on Monday.  Harlow Asa, PharmD, [place] Management [phone]

## 2017-06-03 ENCOUNTER — Ambulatory Visit: Payer: Self-pay | Admitting: *Deleted

## 2017-06-05 ENCOUNTER — Other Ambulatory Visit: Payer: Self-pay | Admitting: Pharmacist

## 2017-06-05 NOTE — Patient Outreach (Signed)
Call to follow up with Dr. Laurelyn Sickle office regarding the patient assistance for the patient's Incruse inhaler. Speak with Eustaquio Maize in the office who reports that the office faxed the prescription for Incruse to the [place] patient assistance program. However, when I ask for the fax number that was used, it is not the correct number for the [place] assistance program. Provide the correct fax number [phone]) and request that the prescription be refaxed.   Harlow Asa, PharmD, [place] Management [phone]

## 2017-06-07 ENCOUNTER — Other Ambulatory Visit: Payer: Self-pay | Admitting: Pharmacist

## 2017-06-07 NOTE — Patient Outreach (Addendum)
Call to follow up with Lawrence Steele regarding medication assistance for the Incruse inhaler. Speak with patient. HIPAA identifiers verified and verbal consent received.  Call to the [place] patient assistance program with Lawrence Steele on the line. Speak with Lawrence Steele with [place] patient assistance. Lawrence Steele reports that the prescription for the Incruse inhaler has been received and that the pharmacy has started processing this prescription. Reports that the Incruse inhaler, along with the patient's Advair and Ventolin inhalers should be shipped out today and to the patient's home in 7-10 business days.  Lawrence Steele denies any further medication questions at this time. Lawrence Steele reports that he has used the Elipta type of inhaler in the past and states that he will call me if he has any questions. Patient confirms that he has my phone number and expresses appreciation for my help.   Will close pharmacy episode at this time  Harlow Asa, PharmD, [place] Management [phone]

## 2017-06-14 ENCOUNTER — Other Ambulatory Visit: Payer: Self-pay | Admitting: *Deleted

## 2017-06-14 NOTE — Patient Outreach (Signed)
[place] [facility]) Care Management  06/14/2017  Lawrence Steele September 22, 1950 [id number]   Phone call to patient to follow up on community resources previously provided and to assess for continued community resource needs.  Phone number was disconnected and patient could not be reached.   Plan: This Education officer, museum will make another attempt to contact patient within 1 week.   Sheralyn Boatman [facility] Care Management [phone]

## 2017-06-17 ENCOUNTER — Other Ambulatory Visit: Payer: Self-pay | Admitting: *Deleted

## 2017-06-17 NOTE — Patient Outreach (Signed)
[place] [facility]) Care Management  06/17/2017  Lawrence Steele 24-Jan-1951 [id number]    Phone call to patient to follow up on community resources previously provided and to assess for continued community resource needs.  Phone number was disconnected and patient could not be reached. There is no other contact information available for patient.  This Education officer, museum will try again within 2 weeks.    Sheralyn Boatman [facility] Care Management [phone]

## 2017-06-21 ENCOUNTER — Other Ambulatory Visit: Payer: Self-pay | Admitting: Pharmacist

## 2017-06-21 ENCOUNTER — Other Ambulatory Visit: Payer: Self-pay | Admitting: *Deleted

## 2017-06-21 ENCOUNTER — Encounter: Payer: Self-pay | Admitting: *Deleted

## 2017-06-21 NOTE — Patient Outreach (Signed)
[facility]) Care Management  06/21/2017  YAMAN GRAUBERGER Jan 06, 1951 [id number]   Phone call to patient to follow up on community resources provided for food banks and food pantries. This social worker made aware that he now has a new contact number. Per patient, he has received the list, however has not had a chance to use the resources yet. Patient states having no additional community resource needs at thit time, however states that he did not receive some of his medication, Incruse. Per patient, he spoke with [facility] Pharmacist  Roselyn Bering who stated that he should receive this medication next week.  Patient had no additional community resource needs at this time. Patient to be closed to [facility] social work. Patient given this social worker's contact information and encouraged to call if there are any further questions or concerns.    Plan: Patient to be closed to [place] Management at this time.          Patient to inform patient's primary care doctor.   Sheralyn Boatman [facility] Care Management [phone]

## 2017-06-21 NOTE — Patient Outreach (Signed)
Receive a voicemail from [employer] letting me know that he just received his Advair and Ventolin in the mail from [place] patient assistance, but that the did not receive the Incruse with this shipment. In his message, patient states that he has a new phone number, [phone]. Call to speak with patient at the number he provided. Patient does not answer and no voicemail picks up.  Place a call to the [place] patient assistance program. Program representative reports that the Incruse was shipped yesterday, 06/20/17.   Called again to Mr. Cortopassi and speak with patient. HIPAA identifiers verified and verbal consent received. Let Mr. Bukhari know that the Incruse shipped yesterday. Remind patient again to be sure to reorder these medications again from the patient assistance program before the end of the calendar year. Patient verbalizes understanding.  Mr. Kiser denies any further medication questions or concerns at this time. Patient asks that I remove his old phone number and put this new number in for him. Update EPIC accordingly.  Will close pharmacy episode at this time.        Harlow Asa, PharmD, [place] Management [phone]

## 2017-06-24 NOTE — Telephone Encounter (Signed)
This encounter was created in error - please disregard.

## 2017-06-27 ENCOUNTER — Ambulatory Visit: Payer: Self-pay | Admitting: *Deleted

## 2017-07-02 ENCOUNTER — Other Ambulatory Visit: Payer: Self-pay | Admitting: Nurse Practitioner

## 2017-07-02 ENCOUNTER — Ambulatory Visit
Admission: RE | Admit: 2017-07-02 | Discharge: 2017-07-02 | Disposition: A | Discharge status: Home / Self Care | Payer: Medicare PPO | Source: Ambulatory Visit | Attending: Nurse Practitioner | Admitting: Nurse Practitioner

## 2017-07-02 DIAGNOSIS — M545 Low back pain: Secondary | ICD-10-CM | POA: Diagnosis present

## 2017-07-02 DIAGNOSIS — M5136 Other intervertebral disc degeneration, lumbar region: Secondary | ICD-10-CM | POA: Diagnosis not present

## 2017-07-30 ENCOUNTER — Other Ambulatory Visit: Payer: Self-pay | Admitting: Orthopedic Surgery

## 2017-07-30 DIAGNOSIS — M5416 Radiculopathy, lumbar region: Secondary | ICD-10-CM

## 2017-07-31 ENCOUNTER — Ambulatory Visit: Payer: Medicare PPO

## 2017-08-12 ENCOUNTER — Ambulatory Visit: Payer: Medicare PPO

## 2017-08-14 ENCOUNTER — Emergency Department
Admission: EM | Admit: 2017-08-14 | Discharge: 2017-08-14 | Disposition: A | Discharge status: Home / Self Care | Payer: Medicare PPO | Attending: Emergency Medicine | Admitting: Emergency Medicine

## 2017-08-14 ENCOUNTER — Emergency Department: Payer: Medicare PPO

## 2017-08-14 DIAGNOSIS — I132 Hypertensive heart and chronic kidney disease with heart failure and with stage 5 chronic kidney disease, or end stage renal disease: Secondary | ICD-10-CM | POA: Insufficient documentation

## 2017-08-14 DIAGNOSIS — N186 End stage renal disease: Secondary | ICD-10-CM | POA: Insufficient documentation

## 2017-08-14 DIAGNOSIS — I25111 Atherosclerotic heart disease of native coronary artery with angina pectoris with documented spasm: Secondary | ICD-10-CM | POA: Diagnosis not present

## 2017-08-14 DIAGNOSIS — F17210 Nicotine dependence, cigarettes, uncomplicated: Secondary | ICD-10-CM | POA: Diagnosis not present

## 2017-08-14 DIAGNOSIS — J449 Chronic obstructive pulmonary disease, unspecified: Secondary | ICD-10-CM | POA: Diagnosis not present

## 2017-08-14 DIAGNOSIS — I509 Heart failure, unspecified: Secondary | ICD-10-CM | POA: Diagnosis not present

## 2017-08-14 DIAGNOSIS — R0600 Dyspnea, unspecified: Secondary | ICD-10-CM | POA: Diagnosis present

## 2017-08-14 DIAGNOSIS — Z7984 Long term (current) use of oral hypoglycemic drugs: Secondary | ICD-10-CM | POA: Insufficient documentation

## 2017-08-14 DIAGNOSIS — F10920 Alcohol use, unspecified with intoxication, uncomplicated: Secondary | ICD-10-CM | POA: Insufficient documentation

## 2017-08-14 DIAGNOSIS — Z7982 Long term (current) use of aspirin: Secondary | ICD-10-CM | POA: Insufficient documentation

## 2017-08-14 DIAGNOSIS — J45909 Unspecified asthma, uncomplicated: Secondary | ICD-10-CM | POA: Insufficient documentation

## 2017-08-14 DIAGNOSIS — R0789 Other chest pain: Secondary | ICD-10-CM | POA: Diagnosis not present

## 2017-08-14 DIAGNOSIS — E1122 Type 2 diabetes mellitus with diabetic chronic kidney disease: Secondary | ICD-10-CM | POA: Insufficient documentation

## 2017-08-14 LAB — CBC
HCT: 43.2 % (ref 40.0–52.0)
Hemoglobin: 14.8 g/dL (ref 13.0–18.0)
MCH: 35.9 pg — AB (ref 26.0–34.0)
MCHC: 34.2 g/dL (ref 32.0–36.0)
MCV: 105.1 fL — AB (ref 80.0–100.0)
PLATELETS: 103 10*3/uL — AB (ref 150–440)
RBC: 4.11 MIL/uL — AB (ref 4.40–5.90)
RDW: 14.3 % (ref 11.5–14.5)
WBC: 9.6 10*3/uL (ref 3.8–10.6)

## 2017-08-14 LAB — TROPONIN I: Troponin I: 0.03 ng/mL (ref <0.03)

## 2017-08-14 LAB — BASIC METABOLIC PANEL
Anion gap: 17 — ABNORMAL HIGH (ref 5–15)
BUN: 15 mg/dL (ref 6–20)
CHLORIDE: 93 mmol/L — AB (ref 101–111)
CO2: 22 mmol/L (ref 22–32)
CREATININE: 0.68 mg/dL (ref 0.61–1.24)
Calcium: 7.9 mg/dL — ABNORMAL LOW (ref 8.9–10.3)
GFR calc Af Amer: >60 mL/min (ref >60)
GFR calc non Af Amer: >60 mL/min (ref >60)
GLUCOSE: 305 mg/dL — AB (ref 65–99)
Potassium: 3.8 mmol/L (ref 3.5–5.1)
Sodium: 132 mmol/L — ABNORMAL LOW (ref 135–145)

## 2017-08-14 LAB — ETHANOL: Alcohol, Ethyl (B): 383 mg/dL (ref <10)

## 2017-08-14 LAB — LIPASE, BLOOD: Lipase: 40 U/L (ref 11–51)

## 2017-08-14 MED ORDER — IPRATROPIUM-ALBUTEROL 0.5-2.5 (3) MG/3ML IN SOLN
RESPIRATORY_TRACT | Status: AC
  Administered 2017-08-14: 3 mL
  Filled 2017-08-14: qty 3

## 2017-08-14 MED ORDER — IPRATROPIUM-ALBUTEROL 0.5-2.5 (3) MG/3ML IN SOLN
RESPIRATORY_TRACT | Status: AC
  Administered 2017-08-14: 04:00:00
  Filled 2017-08-14: qty 3

## 2017-08-14 NOTE — ED Notes (Signed)
Gave pt a food tray

## 2017-08-14 NOTE — ED Provider Notes (Addendum)
[facility] Emergency Department Provider Note   First MD Initiated Contact with Patient 08/14/17 0302     (approximate)  I have reviewed the triage vital signs and the nursing notes.  Level 5 caveat: History limited secondary to suspected alcohol intoxication HISTORY  Chief Complaint Chest Pain   HPI Lawrence Steele is a 66 y.o. male with below list of chronic medical conditions presents to the emergency department [facility] with dyspnea chest discomfort and history of drinking "2-1/2 gallons of vodka tonight".  Patient denies any suicidal ideation at present.   Past Medical History:  Diagnosis Date  . AAA (abdominal aortic aneurysm) ([place])   . AAA (abdominal aortic aneurysm) ([place])   . Anxiety   . Asthma   . CHF (congestive heart failure) ([place])   . COPD (chronic obstructive pulmonary disease) ([place])   . Coronary artery disease   . Diabetes mellitus without complication ([place])   . ESRD (end stage renal disease) ([place])   . Hyperlipidemia   . Hypertension   . Iliac artery aneurysm, bilateral ([place])   . Obstructive sleep apnea-hypopnea syndrome   . Sleep apnea     Patient Active Problem List   Diagnosis Date Noted  . Smoker 04/02/2016  . Pain in the chest 08/20/2014  . SOB (shortness of breath) 08/20/2014  . Coronary artery disease involving native coronary artery of native heart with angina pectoris with documented spasm ([place]) 08/20/2014  . Emphysema of lung ([place]) 08/20/2014  . Coronary artery disease involving native coronary artery of native heart with unstable angina pectoris ([place]) 08/20/2014  . AAA (abdominal aortic aneurysm) without rupture ([place]) 08/20/2014  . Iliac artery aneurysm, bilateral ([place]) 08/20/2014  . Hyperlipidemia 08/20/2014  . Essential hypertension 08/20/2014  . Morbid obesity ([place]) 08/20/2014    Past Surgical History:  Procedure Laterality Date  . ABDOMINAL AORTIC ANEURYSM REPAIR  03/06/2015   [place]    . CARDIAC CATHETERIZATION  2010,03/16/2003,11/04/2002  . CORONARY ANGIOPLASTY  03/16/2003   stent to the RCA  & 2/18/2004stent mid circumflex  . ELBOW SURGERY    . HERNIA REPAIR    . PENILE PROSTHESIS IMPLANT  1995  . TONSILECTOMY, ADENOIDECTOMY, BILATERAL MYRINGOTOMY AND TUBES      Prior to Admission medications   Medication Sig Start Date End Date Taking? Authorizing Provider  acetaminophen (TYLENOL) 500 MG tablet Take 500 mg by mouth every 6 (six) hours as needed.     [provider]  albuterol (PROAIR HFA) 108 (90 BASE) MCG/ACT inhaler Inhale 2 puffs into the lungs every 6 (six) hours as needed.     [provider]  allopurinol (ZYLOPRIM) 100 MG tablet Take 100 mg by mouth daily.  07/25/05   [provider]  aspirin 81 MG tablet Take 81 mg by mouth daily.     [provider]  atenolol (TENORMIN) 25 MG tablet Take 1 tablet (25 mg total) by mouth daily. 04/02/16   Minna Merritts, MD  Fluticasone-Salmeterol (ADVAIR DISKUS) 250-50 MCG/DOSE AEPB Inhale 1 puff into the lungs 2 (two) times daily. Frequency:BID   Dosage:0.0     Instructions:  Note:Dose: 250-50MCG 11/01/04   [provider]  isosorbide mononitrate (IMDUR) 60 MG 24 hr tablet Take 60 mg by mouth daily.     [provider]  loratadine (CLARITIN) 10 MG tablet Take 10 mg by mouth daily as needed.     [provider]  metFORMIN (GLUCOPHAGE)  500 MG tablet Take 500 mg by mouth 2 (two) times daily with a meal.    [provider]  nitroGLYCERIN (NITROSTAT) 0.4 MG SL tablet Place 0.4 mg under the tongue.    [provider]  OXYGEN Inhale into the lungs. 2 liters @@ 24 hours daily.    [provider]  simvastatin (ZOCOR) 20 MG tablet Take 1 tablet (20 mg total) by mouth at bedtime. 12/20/16 03/20/17  Minna Merritts, MD  tiotropium (SPIRIVA HANDIHALER) 18 MCG inhalation capsule Place 18 mcg into inhaler and inhale daily. Frequency:QD    Dosage:18   MCG  Instructions:  Note:Dose: 18MCG 01/22/05   [provider]  traMADol (ULTRAM) 50 MG tablet Take 50 mg by mouth 2 (two) times daily as needed for pain.    [provider]  traZODone (DESYREL) 100 MG tablet Take 100 mg by mouth at bedtime.  10/22/08   [provider]    Allergies Codeine  Family History  Problem Relation Age of Onset  . Hypertension Mother   . Hyperlipidemia Mother   . Heart attack Father 70  . Heart disease Father   . Hypertension Father   . Hyperlipidemia Father     Social History Social History   Tobacco Use  . Smoking status: Current Every Day Smoker    Packs/day: 0.25    Years: 45.00    Pack years: 11.25    Types: Cigarettes  . Smokeless tobacco: Never Used  Substance Use Topics  . Alcohol use: Yes    Comment: 2 half gallons of vodka  . Drug use: No    Review of Systems Constitutional: No fever/chills Eyes: No visual changes. ENT: No sore throat. Cardiovascular: Denies chest pain. Respiratory: Denies shortness of breath. Gastrointestinal: No abdominal pain.  No nausea, no vomiting.  No diarrhea.  No constipation. Genitourinary: Negative for dysuria. Musculoskeletal: Negative for neck pain.  Negative for back pain. Integumentary: Negative for rash. Neurological: Negative for headaches, focal weakness or numbness.   ____________________________________________   PHYSICAL EXAM:  VITAL SIGNS: ED Triage Vitals  Enc Vitals Group     BP 08/14/17 0317 (!) 163/86     Pulse Rate 08/14/17 0317 98     Resp 08/14/17 0317 (!) 26     Temp 08/14/17 0317 97.8 F (36.6 C)     Temp Source 08/14/17 0317 Oral     SpO2 08/14/17 0317 99 %     Weight 08/14/17 0320 120.2 kg (265 lb)     Height 08/14/17 0320 1.753 m (5\' 9" )     Head Circumference --      Peak Flow --      Pain Score --      Pain Loc --      Pain Edu? --      Excl. in [place]? --     Constitutional: Alert and oriented.  Appears intoxicated Eyes:  Conjunctivae are normal.  Head: Atraumatic. Mouth/Throat: Mucous membranes are moist.  Oropharynx non-erythematous. Neck: No stridor.   Cardiovascular: Normal rate, regular rhythm. Good peripheral circulation. Grossly normal heart sounds. Respiratory: Normal respiratory effort.  No retractions. Lungs CTAB. Gastrointestinal: Soft and nontender. No distention.  Musculoskeletal: No lower extremity tenderness nor edema. No gross deformities of extremities. Neurologic:  Normal speech and language. No gross focal neurologic deficits are appreciated.  Skin:  Skin is warm, dry and intact. No rash noted. Psychiatric: Appears intoxicated.  Speech and behavior are normal.  ____________________________________________   LABS (all labs ordered  are listed, but only abnormal results are displayed)  Labs Reviewed  BASIC METABOLIC PANEL - Abnormal; Notable for the following components:      Result Value   Sodium 132 (*)    Chloride 93 (*)    Glucose, Bld 305 (*)    Calcium 7.9 (*)    Anion gap 17 (*)    All other components within normal limits  CBC - Abnormal; Notable for the following components:   RBC 4.11 (*)    MCV 105.1 (*)    MCH 35.9 (*)    Platelets 103 (*)    All other components within normal limits  TROPONIN I - Abnormal; Notable for the following components:   Troponin I 0.03 (*)    All other components within normal limits  ETHANOL - Abnormal; Notable for the following components:   Alcohol, Ethyl (B) 383 (*)    All other components within normal limits  LIPASE, BLOOD   _____  RADIOLOGY I, [place], personally viewed and evaluated these images (plain radiographs) as part of my medical decision making, as well as reviewing the written report by the radiologist.  Dg Chest Port 1 View  Result Date: 08/14/2017 CLINICAL DATA:  Chest pain and shortness of breath. EXAM: PORTABLE CHEST 1 VIEW COMPARISON:  03/30/2017 FINDINGS: Unchanged cardiomegaly mediastinal contours.  Stable pulmonary vasculature with borderline vascular congestion. Left basilar scarring. No consolidation, pleural effusion, or pneumothorax. No acute osseous abnormalities are seen. IMPRESSION: Stable cardiomegaly and left lung base scarring. No acute abnormality. Electronically Signed   By: Jeb Levering M.D.   On: 08/14/2017 03:38   ED ECG REPORT I, Maxwell N Christohper Dube, the attending physician, personally viewed and interpreted this ECG.   Date: 08/14/2017  EKG Time: 3:08 AM  Rate: 102  Rhythm: Sinus tachycardia  Axis: Normal  Intervals: Normal  ST&T Change: None   Procedures   ____________________________________________   INITIAL IMPRESSION / ASSESSMENT AND PLAN / ED COURSE  As part of my medical decision making, I reviewed the following data within the electronic MEDICAL RECORD [id number] year old male presented with above-stated history and physical exam consistent with acute alcohol intoxication which was confirmed with a blood alcohol level of 383.  Patient denied any suicidal ideation on reevaluation.  Patient denies any chest pain no shortness of breath or any other complaints and is adamantly requesting to be discharged at this time.  Patient ambulated without difficulty and states that he will obtain a cab to get him home. ____________________________________________  FINAL CLINICAL IMPRESSION(S) / ED DIAGNOSES  Final diagnoses:  Alcoholic intoxication without complication ([place])     MEDICATIONS GIVEN DURING THIS VISIT:  Medications  ipratropium-albuterol (DUONEB) 0.5-2.5 (3) MG/3ML nebulizer solution (  Given by Other 08/14/17 0424)  ipratropium-albuterol (DUONEB) 0.5-2.5 (3) MG/3ML nebulizer solution (3 mLs  Given 08/14/17 0445)     ED Discharge Orders    None       Note:  This document was prepared using Dragon voice recognition software and may include unintentional dictation errors.    Gregor Hams, MD 08/14/17 3664    Gregor Hams,  MD 08/14/17 [phone]

## 2017-08-14 NOTE — ED Notes (Signed)
Ambulated pt per Dr. [name] request.  Pt steady when walking.  Dr Owens Shark aware

## 2017-08-14 NOTE — ED Triage Notes (Signed)
Pt to the er for chest pain, sob, etoh intoxication and suicidal thoughts.

## 2017-08-14 NOTE — ED Notes (Signed)
Pt states he needs to use the urinal. Pt continues to attempt to get up. Told pt to stay in bed. Pt states he is unable to [employer] down. Pt unable to pee. Pt stumbled but got back in bed without falling.

## 2017-08-14 NOTE — ED Notes (Signed)
Pt denies any suicidal ideation or plans to hurt himself and looks much improved. Pt balance is god and pt is coherent. Pt is to be d/c home

## 2017-08-14 NOTE — ED Notes (Signed)
Pt took IV out and is demanding that we let him go home.

## 2017-08-27 ENCOUNTER — Other Ambulatory Visit: Payer: Self-pay | Admitting: Internal Medicine

## 2017-08-27 ENCOUNTER — Ambulatory Visit
Admission: RE | Admit: 2017-08-27 | Discharge: 2017-08-27 | Disposition: A | Discharge status: Home / Self Care | Payer: Medicare Other | Source: Ambulatory Visit | Attending: Internal Medicine | Admitting: Internal Medicine

## 2017-08-27 ENCOUNTER — Ambulatory Visit: Admission: RE | Admit: 2017-08-27 | Payer: Medicare Other | Source: Ambulatory Visit

## 2017-08-27 DIAGNOSIS — J449 Chronic obstructive pulmonary disease, unspecified: Secondary | ICD-10-CM | POA: Insufficient documentation

## 2017-08-27 DIAGNOSIS — R05 Cough: Secondary | ICD-10-CM

## 2017-08-27 DIAGNOSIS — R059 Cough, unspecified: Secondary | ICD-10-CM

## 2017-08-31 ENCOUNTER — Ambulatory Visit
Admission: RE | Admit: 2017-08-31 | Discharge: 2017-08-31 | Disposition: A | Discharge status: Home / Self Care | Payer: Medicare Other | Source: Ambulatory Visit | Attending: Orthopedic Surgery | Admitting: Orthopedic Surgery

## 2017-08-31 DIAGNOSIS — M5416 Radiculopathy, lumbar region: Secondary | ICD-10-CM

## 2017-09-04 ENCOUNTER — Other Ambulatory Visit: Payer: Self-pay

## 2017-09-04 ENCOUNTER — Other Ambulatory Visit: Payer: Self-pay | Admitting: Nurse Practitioner

## 2017-09-04 MED ORDER — CYCLOBENZAPRINE HCL 10 MG PO TABS: 10.0000 mg | ORAL_TABLET | Freq: Two times a day (BID) | ORAL | 2 refills | Status: DC | PRN

## 2017-09-06 ENCOUNTER — Other Ambulatory Visit: Payer: Self-pay

## 2017-09-06 ENCOUNTER — Telehealth: Payer: Self-pay

## 2017-09-06 MED ORDER — PREDNISONE 10 MG (21) PO TBPK: ORAL_TABLET | ORAL | 0 refills | Status: DC

## 2017-09-06 MED ORDER — LEVOFLOXACIN 500 MG PO TABS: 500.0000 mg | ORAL_TABLET | Freq: Every day | ORAL | 0 refills | Status: DC

## 2017-09-06 NOTE — Telephone Encounter (Signed)
Pt called c/o still having sweats and cold chills, coughing and having brown to thick white mucus.  Was seen on 08/22/17 and given abx and pred taper and is asking for another round of the meds.  Spoke to [employer] and she is giving pt levaquin 500mg  x 7 days and pred taper x6 days.  Sent both to pharmacy and informed pt.  dbs

## 2017-09-18 ENCOUNTER — Ambulatory Visit
Admission: RE | Admit: 2017-09-18 | Discharge: 2017-09-18 | Disposition: A | Discharge status: Home / Self Care | Payer: Medicare Other | Source: Ambulatory Visit | Attending: Orthopedic Surgery | Admitting: Orthopedic Surgery

## 2017-09-18 DIAGNOSIS — M5116 Intervertebral disc disorders with radiculopathy, lumbar region: Secondary | ICD-10-CM | POA: Insufficient documentation

## 2017-09-18 DIAGNOSIS — M5416 Radiculopathy, lumbar region: Secondary | ICD-10-CM | POA: Diagnosis present

## 2017-09-18 DIAGNOSIS — M545 Low back pain: Secondary | ICD-10-CM | POA: Diagnosis not present

## 2017-09-19 ENCOUNTER — Ambulatory Visit: Payer: Medicare Other | Admitting: Internal Medicine

## 2017-09-19 ENCOUNTER — Encounter: Payer: Self-pay | Admitting: Internal Medicine

## 2017-09-19 VITALS — BP 134/74 | HR 87 | Resp 16 | Ht 70.0 in | Wt 257.0 lb

## 2017-09-19 DIAGNOSIS — G4733 Obstructive sleep apnea (adult) (pediatric): Secondary | ICD-10-CM

## 2017-09-19 DIAGNOSIS — J9611 Chronic respiratory failure with hypoxia: Secondary | ICD-10-CM | POA: Insufficient documentation

## 2017-09-19 DIAGNOSIS — R300 Dysuria: Secondary | ICD-10-CM | POA: Insufficient documentation

## 2017-09-19 DIAGNOSIS — E119 Type 2 diabetes mellitus without complications: Secondary | ICD-10-CM | POA: Insufficient documentation

## 2017-09-19 DIAGNOSIS — M542 Cervicalgia: Secondary | ICD-10-CM

## 2017-09-19 DIAGNOSIS — J42 Unspecified chronic bronchitis: Secondary | ICD-10-CM | POA: Insufficient documentation

## 2017-09-19 DIAGNOSIS — I739 Peripheral vascular disease, unspecified: Secondary | ICD-10-CM | POA: Insufficient documentation

## 2017-09-19 DIAGNOSIS — J209 Acute bronchitis, unspecified: Secondary | ICD-10-CM

## 2017-09-19 DIAGNOSIS — M1990 Unspecified osteoarthritis, unspecified site: Secondary | ICD-10-CM | POA: Insufficient documentation

## 2017-09-19 DIAGNOSIS — J300 Vasomotor rhinitis: Secondary | ICD-10-CM

## 2017-09-19 DIAGNOSIS — R5381 Other malaise: Secondary | ICD-10-CM

## 2017-09-19 DIAGNOSIS — D519 Vitamin B12 deficiency anemia, unspecified: Secondary | ICD-10-CM | POA: Insufficient documentation

## 2017-09-19 DIAGNOSIS — J440 Chronic obstructive pulmonary disease with acute lower respiratory infection: Secondary | ICD-10-CM

## 2017-09-19 DIAGNOSIS — F411 Generalized anxiety disorder: Secondary | ICD-10-CM

## 2017-09-19 DIAGNOSIS — I2510 Atherosclerotic heart disease of native coronary artery without angina pectoris: Secondary | ICD-10-CM

## 2017-09-19 DIAGNOSIS — K5792 Diverticulitis of intestine, part unspecified, without perforation or abscess without bleeding: Secondary | ICD-10-CM | POA: Diagnosis not present

## 2017-09-19 DIAGNOSIS — I499 Cardiac arrhythmia, unspecified: Secondary | ICD-10-CM

## 2017-09-19 DIAGNOSIS — G43019 Migraine without aura, intractable, without status migrainosus: Secondary | ICD-10-CM | POA: Insufficient documentation

## 2017-09-19 DIAGNOSIS — F17218 Nicotine dependence, cigarettes, with other nicotine-induced disorders: Secondary | ICD-10-CM

## 2017-09-19 DIAGNOSIS — I6523 Occlusion and stenosis of bilateral carotid arteries: Secondary | ICD-10-CM | POA: Diagnosis not present

## 2017-09-19 DIAGNOSIS — K469 Unspecified abdominal hernia without obstruction or gangrene: Secondary | ICD-10-CM | POA: Insufficient documentation

## 2017-09-19 DIAGNOSIS — M791 Myalgia, unspecified site: Secondary | ICD-10-CM | POA: Insufficient documentation

## 2017-09-19 MED ORDER — IPRATROPIUM-ALBUTEROL 0.5-2.5 (3) MG/3ML IN SOLN: 3.0000 mL | Freq: Four times a day (QID) | RESPIRATORY_TRACT | Status: DC | PRN

## 2017-09-19 NOTE — Progress Notes (Signed)
[facility] [place], [place] [address]  Pulmonary Sleep Medicine  Office Visit Note  Patient Name: Lawrence Steele DOB: [birth year] MRN [id number]  Date of Service: 09/19/2017     Complaints/HPI: He presents here as a follow-up after acute bronchitis flare.  He is doing well now.  He had to have a 2nd round of steroids as well as antibiotics but he states that he is definitely much better.  Denies having any fevers no chest pain.  Has an occasional shortness of breath still noted.  He has not required any admissions to the hospital.  We did review his last chest x-ray and the chest x-ray revealed no pneumonia heart failure.  He did have evidence of COPD noted.  He was requesting refills for his nebulizer medications and this will be ordered today.  Current Medication: Outpatient Encounter Medications as of 09/19/2017  Medication Sig  . acetaminophen (TYLENOL) 500 MG tablet Take 500 mg by mouth every 6 (six) hours as needed.   Marland Kitchen albuterol (PROAIR HFA) 108 (90 BASE) MCG/ACT inhaler Inhale 2 puffs into the lungs every 6 (six) hours as needed.   Marland Kitchen allopurinol (ZYLOPRIM) 100 MG tablet Take 100 mg by mouth daily.   Marland Kitchen aspirin 81 MG tablet Take 81 mg by mouth daily.   Marland Kitchen atenolol (TENORMIN) 25 MG tablet Take 1 tablet (25 mg total) by mouth daily.  . cyclobenzaprine (FLEXERIL) 10 MG tablet Take 1 tablet (10 mg total) by mouth 2 (two) times daily as needed for muscle spasms.  . Fluticasone-Salmeterol (ADVAIR DISKUS) 250-50 MCG/DOSE AEPB Inhale 1 puff into the lungs 2 (two) times daily. Frequency:BID   Dosage:0.0     Instructions:  Note:Dose: 250-50MCG  . isosorbide mononitrate (IMDUR) 60 MG 24 hr tablet Take 60 mg by mouth daily.   Marland Kitchen loratadine (CLARITIN) 10 MG tablet Take 10 mg by mouth daily as needed.   . metFORMIN (GLUCOPHAGE) 500 MG tablet Take 500 mg by mouth 2 (two) times daily with a meal.  . nitroGLYCERIN (NITROSTAT) 0.4 MG SL tablet Place 0.4 mg under the tongue.  .  OXYGEN Inhale into the lungs. 2 liters @@ 24 hours daily.  Marland Kitchen tiotropium (SPIRIVA HANDIHALER) 18 MCG inhalation capsule Place 18 mcg into inhaler and inhale daily. Frequency:QD   Dosage:18   MCG  Instructions:  Note:Dose: 18MCG  . traMADol (ULTRAM) 50 MG tablet Take 50 mg by mouth 2 (two) times daily as needed for pain.  . traZODone (DESYREL) 100 MG tablet Take 100 mg by mouth at bedtime.   Marland Kitchen doxycycline (VIBRA-TABS) 100 MG tablet   . levofloxacin (LEVAQUIN) 500 MG tablet Take 1 tablet (500 mg total) by mouth daily. (Patient not taking: Reported on 09/19/2017)  . meloxicam (MOBIC) 7.5 MG tablet   . predniSONE (STERAPRED UNI-PAK 21 TAB) 10 MG (21) TBPK tablet Take as directed for 6 days (Patient not taking: Reported on 09/19/2017)  . simvastatin (ZOCOR) 20 MG tablet Take 1 tablet (20 mg total) by mouth at bedtime.   No facility-administered encounter medications on file as of 09/19/2017.     Surgical History: Past Surgical History:  Procedure Laterality Date  . ABDOMINAL AORTIC ANEURYSM REPAIR  03/06/2015   [place]    . CARDIAC CATHETERIZATION  2010,03/16/2003,11/04/2002  . CORONARY ANGIOPLASTY  03/16/2003   stent to the RCA  & 2/18/2004stent mid circumflex  . ELBOW SURGERY    . HERNIA REPAIR    . PENILE PROSTHESIS IMPLANT  1995  . TONSILECTOMY, ADENOIDECTOMY, BILATERAL MYRINGOTOMY AND TUBES      Medical History: Past Medical History:  Diagnosis Date  . AAA (abdominal aortic aneurysm) ([place])   . AAA (abdominal aortic aneurysm) ([place])   . Anxiety   . Asthma   . CHF (congestive heart failure) ([place])   . COPD (chronic obstructive pulmonary disease) ([place])   . Coronary artery disease   . Diabetes mellitus without complication ([place])   . ESRD (end stage renal disease) ([place])   . Hyperlipidemia   . Hypertension   . Iliac artery aneurysm, bilateral ([place])   . Obstructive sleep apnea-hypopnea syndrome   . Sleep apnea     Family History: Family History  Problem Relation  Age of Onset  . Hypertension Mother   . Hyperlipidemia Mother   . Heart attack Father 64  . Heart disease Father   . Hypertension Father   . Hyperlipidemia Father     Social History: Social History   Socioeconomic History  . Marital status: Divorced    Spouse name: Not on file  . Number of children: Not on file  . Years of education: Not on file  . Highest education level: Not on file  Social Needs  . Financial resource strain: Not on file  . Food insecurity - worry: Not on file  . Food insecurity - inability: Not on file  . Transportation needs - medical: Not on file  . Transportation needs - non-medical: Not on file  Occupational History  . Not on file  Tobacco Use  . Smoking status: Current Every Day Smoker    Packs/day: 0.25    Years: 45.00    Pack years: 11.25    Types: Cigarettes  . Smokeless tobacco: Never Used  Substance and Sexual Activity  . Alcohol use: Yes    Comment: 2 half gallons of vodka  . Drug use: No  . Sexual activity: Not on file  Other Topics Concern  . Not on file  Social History Narrative  . Not on file     ROS  General: (-) fever, (-) weakness, (-) changes in appetite. Skin: (-) rashes, (-) itching,. Eyes: (-) visual changes, (-) redness, (-) itching, (-) double or blurred vision. Nose and Sinuses: (-) nasal stuffiness or itchiness, (-) postnasal drip Mouth and Throat: (-) sore throat, (-) hoarseness. Neck: (-) swollen glands, (-) enlarged thyroid, (-) neck pain. Respiratory: +cough, (-) bloody sputum, +shortness of breath, +wheezing. Cardiovascular: (-) ankle swelling, (-) chest pain. Lymphatic: (-) lymph node enlargement, (-) lymph node tenderness. Neurologic: (-) numbness, (-) tingling,(-) dizziness. Psychiatric: +anxiety, (-) depression.  Vital Signs: Blood pressure 134/74, pulse 87, resp. rate 16, height 5' 10"  (1.778 m), weight 257 lb (116.6 kg), SpO2 90 %.  Examination: General Appearance: The patient is well-developed,  well-nourished, and in no distress. Skin: Gross inspection of skin demonstrates no evidence of abnormality. Head: Patient's head is normocephalic, no gross deformities. Eyes: no gross deformities noted. ENT: ears appear grossly normal. Nasopharynx appears to be normal. Neck: Supple. No thyromegaly. No LAD. Respiratory: Lungs are clear to auscultation with no adventitious sounds. Cardiovascular: Normal S1 and S2 without murmur or rub. Extremities: No cyanosis. pulses are equal. Neurologic: Alert and oriented. No involuntary movements.  LABS: Recent Results (from the past 2160 hour(s))  Basic metabolic panel     Status: Abnormal   Collection Time: 08/14/17  3:15 AM  Result Value Ref Range   Sodium 132 (L) 135 - 145 mmol/L   Potassium  3.8 3.5 - 5.1 mmol/L   Chloride 93 (L) 101 - 111 mmol/L   CO2 22 22 - 32 mmol/L   Glucose, Bld 305 (H) 65 - 99 mg/dL   BUN 15 6 - 20 mg/dL   Creatinine, Ser 0.68 0.61 - 1.24 mg/dL   Calcium 7.9 (L) 8.9 - 10.3 mg/dL   GFR calc non Af Amer >60 >60 mL/min   GFR calc Af Amer >60 >60 mL/min    Comment: (NOTE) The eGFR has been calculated using the CKD EPI equation. This calculation has not been validated in all clinical situations. eGFR's persistently <60 mL/min signify possible Chronic Kidney Disease.    Anion gap 17 (H) 5 - 15  CBC     Status: Abnormal   Collection Time: 08/14/17  3:15 AM  Result Value Ref Range   WBC 9.6 3.8 - 10.6 K/uL   RBC 4.11 (L) 4.40 - 5.90 MIL/uL   Hemoglobin 14.8 13.0 - 18.0 g/dL   HCT 43.2 40.0 - 52.0 %   MCV 105.1 (H) 80.0 - 100.0 fL   MCH 35.9 (H) 26.0 - 34.0 pg   MCHC 34.2 32.0 - 36.0 g/dL   RDW 14.3 11.5 - 14.5 %   Platelets 103 (L) 150 - 440 K/uL  Troponin I     Status: Abnormal   Collection Time: 08/14/17  3:15 AM  Result Value Ref Range   Troponin I 0.03 (HH) <0.03 ng/mL    Comment: CRITICAL RESULT CALLED TO, READ BACK BY AND VERIFIED WITH SHERIE [facility] AT 1610 08/14/17 ALV   Ethanol     Status: Abnormal    Collection Time: 08/14/17  3:15 AM  Result Value Ref Range   Alcohol, Ethyl (B) 383 (HH) <10 mg/dL    Comment: CRITICAL RESULT CALLED TO, READ BACK BY AND VERIFIED WITH SHERIE [facility] AT 9604 08/14/17 ALV        LOWEST DETECTABLE LIMIT FOR SERUM ALCOHOL IS 10 mg/dL FOR MEDICAL PURPOSES ONLY   Lipase, blood     Status: None   Collection Time: 08/14/17  3:15 AM  Result Value Ref Range   Lipase 40 11 - 51 U/L    Radiology: Mr Lumbar Spine Wo Contrast  Result Date: 09/18/2017 CLINICAL DATA:  Low back pain and bilateral leg pain with numbness and tingling. Lumbar radiculopathy. Several recent falls. EXAM: MRI LUMBAR SPINE WITHOUT CONTRAST TECHNIQUE: Multiplanar, multisequence MR imaging of the lumbar spine was performed. No intravenous contrast was administered. COMPARISON:  Radiographs dated 07/02/2017 and CT scan of the abdomen dated 12/02/2014 FINDINGS: Segmentation:  Standard. Alignment:  Physiologic. Vertebrae:  Bilateral pars defects at L5.  No spondylolisthesis. Conus medullaris and cauda equina: Conus extends to the L1 level. Conus and cauda equina appear normal. Paraspinal and other soft tissues: Aorto bi-iliac stent graft in place. Otherwise, negative. Disc levels: T12-L1 through L2-3: Disc desiccation. No disc bulges or protrusions. Minimal degenerative changes of the facet joints at L2-3. L3-4: Minimal broad-based disc bulge without neural impingement. Slight degenerative changes of the facet joints, left greater than right. L4-5: Tiny broad-based disc bulge with disc desiccation. No neural impingement. Minimal degenerative changes of the facet joints. L5-S1: Tiny disc bulge with no neural impingement. The disc is not desiccated. Bilateral pars defects without spondylolisthesis. Widely patent neural foramina. IMPRESSION: 1. Bilateral pars defects at L5 without spondylolisthesis. No neural impingement or foraminal stenosis at that level. 2. Mild degeneration of the discs throughout the  remainder of the lumbar spine without neural impingement.  Electronically Signed   By: Lorriane Shire M.D.   On: 09/18/2017 10:37    Mr Lumbar Spine Wo Contrast  Result Date: 09/18/2017 CLINICAL DATA:  Low back pain and bilateral leg pain with numbness and tingling. Lumbar radiculopathy. Several recent falls. EXAM: MRI LUMBAR SPINE WITHOUT CONTRAST TECHNIQUE: Multiplanar, multisequence MR imaging of the lumbar spine was performed. No intravenous contrast was administered. COMPARISON:  Radiographs dated 07/02/2017 and CT scan of the abdomen dated 12/02/2014 FINDINGS: Segmentation:  Standard. Alignment:  Physiologic. Vertebrae:  Bilateral pars defects at L5.  No spondylolisthesis. Conus medullaris and cauda equina: Conus extends to the L1 level. Conus and cauda equina appear normal. Paraspinal and other soft tissues: Aorto bi-iliac stent graft in place. Otherwise, negative. Disc levels: T12-L1 through L2-3: Disc desiccation. No disc bulges or protrusions. Minimal degenerative changes of the facet joints at L2-3. L3-4: Minimal broad-based disc bulge without neural impingement. Slight degenerative changes of the facet joints, left greater than right. L4-5: Tiny broad-based disc bulge with disc desiccation. No neural impingement. Minimal degenerative changes of the facet joints. L5-S1: Tiny disc bulge with no neural impingement. The disc is not desiccated. Bilateral pars defects without spondylolisthesis. Widely patent neural foramina. IMPRESSION: 1. Bilateral pars defects at L5 without spondylolisthesis. No neural impingement or foraminal stenosis at that level. 2. Mild degeneration of the discs throughout the remainder of the lumbar spine without neural impingement. Electronically Signed   By: Lorriane Shire M.D.   On: 09/18/2017 10:37    Dg Chest 2 View  Result Date: 08/27/2017 CLINICAL DATA:  One week of productive cough and shortness of breath. History of coronary artery disease with stent placement, COPD,  current smoker. EXAM: CHEST  2 VIEW COMPARISON:  Portable chest x-ray of August 14, 2017 FINDINGS: The lungs are mildly hyperinflated with hemidiaphragm flattening. There is no focal infiltrate. The interstitial markings are coarse in the lower lobes but are stable. The heart and pulmonary vascularity are normal. The mediastinum is normal in width. There is no pleural effusion. There is mild multilevel degenerative disc disease of the thoracic spine. IMPRESSION: COPD. No pneumonia, CHF, nor other acute cardiopulmonary abnormality. Electronically Signed   By: David  Martinique M.D.   On: 08/27/2017 11:40   Mr Lumbar Spine Wo Contrast  Result Date: 09/18/2017 CLINICAL DATA:  Low back pain and bilateral leg pain with numbness and tingling. Lumbar radiculopathy. Several recent falls. EXAM: MRI LUMBAR SPINE WITHOUT CONTRAST TECHNIQUE: Multiplanar, multisequence MR imaging of the lumbar spine was performed. No intravenous contrast was administered. COMPARISON:  Radiographs dated 07/02/2017 and CT scan of the abdomen dated 12/02/2014 FINDINGS: Segmentation:  Standard. Alignment:  Physiologic. Vertebrae:  Bilateral pars defects at L5.  No spondylolisthesis. Conus medullaris and cauda equina: Conus extends to the L1 level. Conus and cauda equina appear normal. Paraspinal and other soft tissues: Aorto bi-iliac stent graft in place. Otherwise, negative. Disc levels: T12-L1 through L2-3: Disc desiccation. No disc bulges or protrusions. Minimal degenerative changes of the facet joints at L2-3. L3-4: Minimal broad-based disc bulge without neural impingement. Slight degenerative changes of the facet joints, left greater than right. L4-5: Tiny broad-based disc bulge with disc desiccation. No neural impingement. Minimal degenerative changes of the facet joints. L5-S1: Tiny disc bulge with no neural impingement. The disc is not desiccated. Bilateral pars defects without spondylolisthesis. Widely patent neural foramina. IMPRESSION:  1. Bilateral pars defects at L5 without spondylolisthesis. No neural impingement or foraminal stenosis at that level. 2. Mild degeneration of the discs  throughout the remainder of the lumbar spine without neural impingement. Electronically Signed   By: Lorriane Shire M.D.   On: 09/18/2017 10:37      Assessment and Plan: Patient Active Problem List   Diagnosis Date Noted  . Diabetes mellitus without complication ([place]) 07/62/2633  . Myalgia 09/19/2017  . Vitamin B12 deficiency anemia 09/19/2017  . Common migraine with intractable migraine 09/19/2017  . Chronic hypoxemic respiratory failure ([place]) 09/19/2017  . Peripheral vascular disease, unspecified ([place]) 09/19/2017  . Dysuria 09/19/2017  . Osteoarthritis 09/19/2017  . Diverticulitis of intestine 09/19/2017  . Cervicalgia 09/19/2017  . Unspecified abdominal hernia without obstruction or gangrene 09/19/2017  . Vasomotor rhinitis 09/19/2017  . Occlusion and stenosis of bilateral carotid arteries 09/19/2017  . Generalized anxiety disorder 09/19/2017  . Malaise 09/19/2017  . Obstructive chronic bronchitis with acute bronchitis ([place]) 09/19/2017  . Obstructive sleep apnea (adult) (pediatric) 09/19/2017  . Cardiac arrhythmia 09/19/2017  . Smoker 04/02/2016  . Pain in the chest 08/20/2014  . SOB (shortness of breath) 08/20/2014  . Coronary artery disease involving native coronary artery of native heart with angina pectoris with documented spasm ([place]) 08/20/2014  . Emphysema of lung ([place]) 08/20/2014  . Coronary artery disease involving native coronary artery of native heart with unstable angina pectoris ([place]) 08/20/2014  . AAA (abdominal aortic aneurysm) without rupture ([place]) 08/20/2014  . Iliac artery aneurysm, bilateral ([place]) 08/20/2014  . Hyperlipidemia 08/20/2014  . Essential hypertension 08/20/2014  . Morbid obesity ([place]) 08/20/2014    1. COPD with exacerbation   Overall seems to be doing well We will continue with present  management supportive care  Follow-up x-rays as necessary Once again discussed smoking cessation at length  2. OSA  he will need to continue using his CPAP device as ordered Seems to have compliance Will follow-up in the CPAP clinic  3. Morbid Obesity  once again with discuss weight loss  Patient needs to continue with dietary restrictions and try also with exercise  4. Ongoing Tobacco abuse  patient has still been smoking we had a lengthy discussion about smoking cessation We will continue with encouraging compliance with recommended therapy  5. CAD  stable at this time no active chest pain follows cardiology   General Counseling: I have discussed the findings of the evaluation and examination with [place].  I have also discussed any further diagnostic evaluation thatmay be needed or ordered today. Minard verbalizes understanding of the findings of todays visit. We also reviewed his medications today and discussed drug interactions and side effects including but not limited excessive drowsiness and altered mental states. We also discussed that there is always a risk not just to him but also people around him. he has been encouraged to call the office with any questions or concerns that should arise related to todays visit.    Time spent: 52mn  I have personally obtained a history, examined the patient, evaluated laboratory and imaging results, formulated the assessment and plan and placed orders.    SAllyne Gee MD FNorthcoast Behavioral Healthcare Northfield CampusPulmonary and Critical Care Sleep medicine

## 2017-09-19 NOTE — Patient Instructions (Signed)

## 2017-09-20 ENCOUNTER — Other Ambulatory Visit: Payer: Self-pay

## 2017-09-27 ENCOUNTER — Ambulatory Visit: Payer: Self-pay | Admitting: Cardiovascular Disease

## 2017-09-28 ENCOUNTER — Other Ambulatory Visit: Payer: Self-pay | Admitting: Internal Medicine

## 2017-09-30 ENCOUNTER — Ambulatory Visit: Payer: Medicare Other | Admitting: Nurse Practitioner

## 2017-09-30 ENCOUNTER — Other Ambulatory Visit: Payer: Self-pay

## 2017-09-30 ENCOUNTER — Encounter: Payer: Self-pay | Admitting: Nurse Practitioner

## 2017-09-30 VITALS — BP 130/80 | HR 70 | Resp 16 | Ht 70.0 in | Wt 259.0 lb

## 2017-09-30 DIAGNOSIS — J440 Chronic obstructive pulmonary disease with acute lower respiratory infection: Secondary | ICD-10-CM

## 2017-09-30 DIAGNOSIS — I10 Essential (primary) hypertension: Secondary | ICD-10-CM

## 2017-09-30 DIAGNOSIS — F17200 Nicotine dependence, unspecified, uncomplicated: Secondary | ICD-10-CM

## 2017-09-30 DIAGNOSIS — G4733 Obstructive sleep apnea (adult) (pediatric): Secondary | ICD-10-CM | POA: Diagnosis not present

## 2017-09-30 DIAGNOSIS — J209 Acute bronchitis, unspecified: Secondary | ICD-10-CM

## 2017-09-30 DIAGNOSIS — E1165 Type 2 diabetes mellitus with hyperglycemia: Secondary | ICD-10-CM

## 2017-09-30 LAB — POCT GLYCOSYLATED HEMOGLOBIN (HGB A1C): Hemoglobin A1C: 9

## 2017-09-30 MED ORDER — UMECLIDINIUM BROMIDE 62.5 MCG/INH IN AEPB: 1.0000 | INHALATION_SPRAY | Freq: Every day | RESPIRATORY_TRACT | 11 refills | Status: DC

## 2017-09-30 MED ORDER — ALLOPURINOL 100 MG PO TABS: 100.0000 mg | ORAL_TABLET | Freq: Every day | ORAL | 3 refills | Status: DC

## 2017-09-30 MED ORDER — IPRATROPIUM BROMIDE 0.02 % IN SOLN: 0.5000 mg | Freq: Four times a day (QID) | RESPIRATORY_TRACT | 12 refills | Status: DC | PRN

## 2017-09-30 MED ORDER — GLIMEPIRIDE 1 MG PO TABS: 1.0000 mg | ORAL_TABLET | Freq: Every day | ORAL | 5 refills | Status: DC

## 2017-09-30 MED ORDER — AZITHROMYCIN 250 MG PO TABS: ORAL_TABLET | ORAL | 0 refills | Status: DC

## 2017-09-30 NOTE — Telephone Encounter (Signed)
Pt needs refill on tramadol

## 2017-09-30 NOTE — Progress Notes (Addendum)
[facility] [place], [place] [address]  Internal MEDICINE  Office Visit Note  Patient Name: Lawrence Steele  [id number]  [id number]  Date of Service: 09/30/2017     Complaints/HPI Pt is here for routine follow up.  Cough  This is a recurrent problem. The current episode started 1 to 4 weeks ago. The problem has been gradually improving. The problem occurs hourly. The cough is productive of purulent sputum. Associated symptoms include chest pain, headaches, myalgias, postnasal drip, rhinorrhea, shortness of breath and wheezing. Pertinent negatives include no sore throat. Nothing aggravates the symptoms. Risk factors for lung disease include smoking/tobacco exposure. He has tried ipratropium inhaler, rest and a beta-agonist inhaler for the symptoms. The treatment provided mild relief. His past medical history is significant for asthma, bronchitis, COPD, emphysema and environmental allergies.    Current Medication: Outpatient Encounter Medications as of 09/30/2017  Medication Sig  . acetaminophen (TYLENOL) 500 MG tablet Take 500 mg by mouth every 6 (six) hours as needed.   Marland Kitchen albuterol (PROAIR HFA) 108 (90 BASE) MCG/ACT inhaler Inhale 2 puffs into the lungs every 6 (six) hours as needed.   Marland Kitchen allopurinol (ZYLOPRIM) 100 MG tablet Take 100 mg by mouth daily.   Marland Kitchen aspirin 81 MG tablet Take 81 mg by mouth daily.   Marland Kitchen atenolol (TENORMIN) 25 MG tablet Take 1 tablet (25 mg total) by mouth daily.  . cyclobenzaprine (FLEXERIL) 10 MG tablet Take 1 tablet (10 mg total) by mouth 2 (two) times daily as needed for muscle spasms.  Marland Kitchen doxycycline (VIBRA-TABS) 100 MG tablet   . Fluticasone-Salmeterol (ADVAIR DISKUS) 250-50 MCG/DOSE AEPB Inhale 1 puff into the lungs 2 (two) times daily. Frequency:BID   Dosage:0.0     Instructions:  Note:Dose: 250-50MCG  . isosorbide mononitrate (IMDUR) 60 MG 24 hr tablet Take 60 mg by mouth daily.   Marland Kitchen levofloxacin (LEVAQUIN) 500 MG tablet Take 1 tablet  (500 mg total) by mouth daily. (Patient not taking: Reported on 09/19/2017)  . loratadine (CLARITIN) 10 MG tablet Take 10 mg by mouth daily as needed.   . meloxicam (MOBIC) 7.5 MG tablet   . metFORMIN (GLUCOPHAGE) 500 MG tablet Take 500 mg by mouth 2 (two) times daily with a meal.  . nitroGLYCERIN (NITROSTAT) 0.4 MG SL tablet Place 0.4 mg under the tongue.  . OXYGEN Inhale into the lungs. 2 liters @@ 24 hours daily.  . predniSONE (STERAPRED UNI-PAK 21 TAB) 10 MG (21) TBPK tablet Take as directed for 6 days (Patient not taking: Reported on 09/19/2017)  . simvastatin (ZOCOR) 20 MG tablet Take 1 tablet (20 mg total) by mouth at bedtime.  Marland Kitchen tiotropium (SPIRIVA HANDIHALER) 18 MCG inhalation capsule Place 18 mcg into inhaler and inhale daily. Frequency:QD   Dosage:18   MCG  Instructions:  Note:Dose: 18MCG  . traMADol (ULTRAM) 50 MG tablet Take 50 mg by mouth 2 (two) times daily as needed for pain.  . traZODone (DESYREL) 100 MG tablet Take 100 mg by mouth at bedtime.    Facility-Administered Encounter Medications as of 09/30/2017  Medication  . ipratropium-albuterol (DUONEB) 0.5-2.5 (3) MG/3ML nebulizer solution 3 mL    Surgical History: Past Surgical History:  Procedure Laterality Date  . ABDOMINAL AORTIC ANEURYSM REPAIR  03/06/2015   [place]    . CARDIAC CATHETERIZATION  2010,03/16/2003,11/04/2002  . CORONARY ANGIOPLASTY  03/16/2003   stent to the RCA  & 2/18/2004stent mid circumflex  . ELBOW SURGERY    .  HERNIA REPAIR    . PENILE PROSTHESIS IMPLANT  1995  . TONSILECTOMY, ADENOIDECTOMY, BILATERAL MYRINGOTOMY AND TUBES      Medical History: Past Medical History:  Diagnosis Date  . AAA (abdominal aortic aneurysm) ([place])   . AAA (abdominal aortic aneurysm) ([place])   . Anxiety   . Asthma   . CHF (congestive heart failure) ([place])   . COPD (chronic obstructive pulmonary disease) ([place])   . Coronary artery disease   . Diabetes mellitus without complication ([place])   . ESRD (end  stage renal disease) ([place])   . Hyperlipidemia   . Hypertension   . Iliac artery aneurysm, bilateral ([place])   . Obstructive sleep apnea-hypopnea syndrome   . Sleep apnea     Family History: Family History  Problem Relation Age of Onset  . Hypertension Mother   . Hyperlipidemia Mother   . Heart attack Father 29  . Heart disease Father   . Hypertension Father   . Hyperlipidemia Father     Social History   Socioeconomic History  . Marital status: Divorced    Spouse name: Not on file  . Number of children: Not on file  . Years of education: Not on file  . Highest education level: Not on file  Social Needs  . Financial resource strain: Not on file  . Food insecurity - worry: Not on file  . Food insecurity - inability: Not on file  . Transportation needs - medical: Not on file  . Transportation needs - non-medical: Not on file  Occupational History  . Not on file  Tobacco Use  . Smoking status: Current Every Day Smoker    Packs/day: 0.25    Years: 45.00    Pack years: 11.25    Types: Cigarettes  . Smokeless tobacco: Never Used  Substance and Sexual Activity  . Alcohol use: Yes    Comment: 2 half gallons of vodka  . Drug use: No  . Sexual activity: Not on file  Other Topics Concern  . Not on file  Social History Narrative  . Not on file      Review of Systems  Constitutional: Positive for fatigue.  HENT: Positive for postnasal drip and rhinorrhea. Negative for sore throat.   Eyes: Negative.   Respiratory: Positive for cough, shortness of breath and wheezing.   Cardiovascular: Positive for chest pain. Negative for palpitations.  Gastrointestinal: Negative for nausea and vomiting.       Decreased appetite  Endocrine:       Patient with history of diabetes. Does not check blood sugars at home. Feels good, though.   Musculoskeletal: Positive for arthralgias, back pain and myalgias.  Skin: Negative.   Allergic/Immunologic: Positive for environmental allergies.   Neurological: Positive for headaches.  Hematological: Negative.   Psychiatric/Behavioral: The patient is nervous/anxious.     Today's Vitals   09/30/17 0827  BP: 130/80  Pulse: 70  Resp: 16  SpO2: 92%  Weight: 259 lb (117.5 kg)  Height: 5\' 10"  (1.778 m)    Physical Exam  Constitutional: He is oriented to person, place, and time. He appears well-developed and well-nourished.  HENT:  Head: Normocephalic.  Eyes: Pupils are equal, round, and reactive to light.  Neck: Normal range of motion. Neck supple.  Cardiovascular: Normal rate. An irregular rhythm present. Frequent extrasystoles are present.  Murmur heard.  Systolic murmur is present with a grade of 2/6. Pulmonary/Chest: Effort normal. No accessory muscle usage. No respiratory distress. He has wheezes. He has rhonchi  in the right upper field, the right middle field, the left upper field and the left middle field.  Congested cough which clears rhonchi in lung fields.   Abdominal: Soft. Bowel sounds are normal. There is no tenderness.  Musculoskeletal: Normal range of motion.  Neurological: He is alert and oriented to person, place, and time.  Skin: Skin is warm and dry.  Psychiatric: He has a normal mood and affect.  Nursing note and vitals reviewed.  Assessment/Plan:    ICD-10-CM   1. Uncontrolled type 2 diabetes mellitus with hyperglycemia (HCC) E11.65 POCT HgB A1C    glimepiride (AMARYL) 1 MG tablet  2. Obstructive chronic bronchitis with acute bronchitis (HCC) J44.0 ipratropium (ATROVENT) 0.02 % nebulizer solution    umeclidinium bromide (INCRUSE ELLIPTA) 62.5 MCG/INH AEPB    azithromycin (ZITHROMAX Z-PAK) 250 MG tablet  3. Essential hypertension I10   4. Obstructive sleep apnea (adult) (pediatric) G47.33   5. Nicotine dependence with current use F17.200   1. HgbA1c 9.0 today. Added amaryl 1mg  tablets with dinner. Continue metformin 1000mg  twice daily. Discussed diabetic diet and importance of regular exercise. 2.  z-pack. Take as directed for 5 days. This follows    Prior treatment with levofloxacin and doxycycline. Use inhalers and nebulizer treatments as needed and as prescribed.  3. bp stable. Continue bp medication as prescribed  4. Continue regular visits with Dr. Devona Konig for CPAP management 5. Strongly encouraged patient to stop smoking.   General Counseling: I have discussed the findings of the evaluation and examination with Caryl.  I have also discussed any further diagnostic evaluation that may be needed or ordered today. Sharone verbalizes understanding of the findings of todays visit. We also reviewed his medications today. he has been encouraged to call the office with any questions or concerns that should arise related to todays visit.  Diabetes Counseling:  1. Addition of ACE inh/ ARB'S for nephroprotection. 2. Diabetic foot care, prevention of complications.  3.Exercise and lose weight.  4. Diabetic eye examination, 5. Monitor blood sugar closlely. nutrition counseling.  6.Sign and symptoms of hypoglycemia including shaking sweating,confusion and headaches. . This patient was seen by [name], FNP- C in Collaboration with Dr Lavera Guise as a part of collaborative care agreement   Time spent:20 minutes    Dr Lavera Guise Internal medicine

## 2017-10-02 ENCOUNTER — Other Ambulatory Visit: Payer: Self-pay

## 2017-10-02 DIAGNOSIS — J440 Chronic obstructive pulmonary disease with acute lower respiratory infection: Secondary | ICD-10-CM

## 2017-10-02 MED ORDER — IPRATROPIUM BROMIDE 0.02 % IN SOLN: RESPIRATORY_TRACT | 1 refills | Status: DC

## 2017-10-02 MED ORDER — ALLOPURINOL 100 MG PO TABS: 100.0000 mg | ORAL_TABLET | Freq: Two times a day (BID) | ORAL | 1 refills | Status: DC

## 2017-10-11 ENCOUNTER — Other Ambulatory Visit: Payer: Self-pay

## 2017-10-11 MED ORDER — DOXYCYCLINE HYCLATE 100 MG PO TABS: 100.0000 mg | ORAL_TABLET | Freq: Every day | ORAL | 4 refills | Status: DC

## 2017-10-13 NOTE — Progress Notes (Signed)
Cardiology Office Note  Date:  10/15/2017   ID:  Lawrence Steele, DOB 1951-08-04, MRN [id number]  PCP:  Lawrence Gee, MD   Chief Complaint  Patient presents with  . Other    6 month follow up. Patient c/o chest pain, SOB all the time, and swelling. Meds reviewed verbally with patient.     HPI:  Lawrence Steele is a pleasant 67 year old gentleman with hx of   COPD,  coronary artery disease, catheterization 2000  50% left circumflex disease diabetes type 2, hemoglobin A1c 9.0 ETOH, vodka long history of smoking, continues to smoke diastolic CHF,  hypertension,  hyperlipidemia,  Morbid obesity Followed at [facility] for a moderately large abdominal aortic aneurysm, dilated iliac arteries. S/p EVAR Ascending aorta <4 cm cough syncope, none in several years  chronic oxygen at night, not on exertion who presents for follow-up of his coronary artery disease.  On his last clinic visit reported having chest pain symptoms and shortness of breath Was taking nitro for symptoms Continues to take nitro as needed once every 2 months, no significant escalation   Had stress test February 2018 No significant ischemia, inferoapical fixed perfusion defect small size  ejection fraction 62% Cardiac catheterization was offered if symptoms got worse  Notes indicating recent falls, low back pain, neuropathy Had MRI lumbar spine with mild degeneration of disks,  Presented to chest pain August 14, 2017  Hospital records reviewed with the patient in detail Notes indicating that he was drinking heavy vodka Diagnosed with alcohol intoxication level was 383  EKG personally reviewed by myself on todays visit Shows normal sinus rhythm rate 85 bpm no significant ST or T wave changes  Other past medical history reviewed with him Previous cardiac catheterization from 2010 reviewed with him showing diffuse mild disease, 50% mid left circumflex, 20% disease in the other vessels   CT ABd 09/2015 at [facility]  completed AAA endovascular stent at [facility] reports -- Stable aortobiiliac endograft with decreased excluded aneurysm size. The previously identified type II endoleak is not well seen on today's study. -- Stable bilateral common iliac artery aneurysms. -- Stable anterior abdominal wall hernia containing nonobstructed small bowel, anterior bladder, and penile prosthesis reservoir.  He is seen by Dr. Humphrey Rolls , pulmonary, did 6 min walk, 94% Only oxygen at night, discussed with him in detail  Was on lipitor for years, just decided to stop this on his own, possibly having stomach issues with Lipitor   previously reported having problems on Crestor , myalgias Myalgias on simvastatin  long history of shortness of breath, on several inhalers  He has been seen at [facility] for a moderately large abdominal aortic aneurysm as well as dilated iliac arteries.   cardiac catheterization 01/18/2009.  Notes indicate he has proximal 20% RCA disease, 10% left main disease, 30 follow by 50% mid left circumflex disease, 20% OM1 disease, 20% OM 2 disease, 20% disease of a ramus branch, 20% mid LAD disease and 20% D1 disease. No intervention was performed at that time He does report a cardiac catheterization prior to that. No further cardiac catheterization or stress testing since 2010  Previously reported chest pain over the past several years. with arm pain, possible numbness in his arm  in the hospital 04/01/2014 for chest pain, shortness of breath. Chest x-ray showed scarring at the left base, lab work reviewed with him showing negative cardiac enzymes, normal renal function, elevated alcohol level 0.28, BNP 270, EKG with no significant ST or T-wave changes  Records below were researched and found on care anywhere, reviewed with the patient today Echocardiogram done at [facility] 07/28/2014 showing normal LV systolic function, dilated thoracic aorta 4.0 cm at the sinus of Valsalva, 3.6 cm above the sinotubular  junction, LVH otherwise normal study  CT scan of the abdomen details a infrarenal abdominal aortic aneurysm arising at the level of the IMA measuring up to 4.8 x 4.3 cm, iliac artery aneurysm 2.6 on the right, 2.2 on the left  PMH:   has a past medical history of AAA (abdominal aortic aneurysm) ([place]), AAA (abdominal aortic aneurysm) ([place]), Anxiety, Asthma, CHF (congestive heart failure) ([place]), COPD (chronic obstructive pulmonary disease) ([place]), Coronary artery disease, Diabetes mellitus without complication ([place]), ESRD (end stage renal disease) ([place]), Hyperlipidemia, Hypertension, Iliac artery aneurysm, bilateral ([place]), Obstructive sleep apnea-hypopnea syndrome, and Sleep apnea.  PSH:    Past Surgical History:  Procedure Laterality Date  . ABDOMINAL AORTIC ANEURYSM REPAIR  03/06/2015   [place]    . CARDIAC CATHETERIZATION  2010,03/16/2003,11/04/2002  . CORONARY ANGIOPLASTY  03/16/2003   stent to the RCA  & 2/18/2004stent mid circumflex  . ELBOW SURGERY    . HERNIA REPAIR    . PENILE PROSTHESIS IMPLANT  1995  . TONSILECTOMY, ADENOIDECTOMY, BILATERAL MYRINGOTOMY AND TUBES      Current Outpatient Medications  Medication Sig Dispense Refill  . acetaminophen (TYLENOL) 500 MG tablet Take 500 mg by mouth every 6 (six) hours as needed.     Marland Kitchen albuterol (PROAIR HFA) 108 (90 BASE) MCG/ACT inhaler Inhale 2 puffs into the lungs every 6 (six) hours as needed.     Marland Kitchen allopurinol (ZYLOPRIM) 100 MG tablet Take 1 tablet (100 mg total) by mouth 2 (two) times daily. 180 tablet 1  . aspirin 81 MG tablet Take 81 mg by mouth daily.     Marland Kitchen atenolol (TENORMIN) 25 MG tablet Take 1 tablet (25 mg total) by mouth daily. 30 tablet 11  . cyclobenzaprine (FLEXERIL) 10 MG tablet Take 1 tablet (10 mg total) by mouth 2 (two) times daily as needed for muscle spasms. 30 tablet 2  . doxycycline (VIBRA-TABS) 100 MG tablet Take 1 tablet (100 mg total) by mouth daily. For acne. 30 tablet 4  .  Fluticasone-Salmeterol (ADVAIR DISKUS) 250-50 MCG/DOSE AEPB Inhale 1 puff into the lungs 2 (two) times daily. Frequency:BID   Dosage:0.0     Instructions:  Note:Dose: 250-50MCG    . glimepiride (AMARYL) 1 MG tablet Take 1 tablet (1 mg total) by mouth daily with supper. 30 tablet 5  . ipratropium (ATROVENT) 0.02 % nebulizer solution Use every 6 hours for Shortness of breath and wheezing and as needed.  Prescription is for 90 days 900 mL 1  . isosorbide mononitrate (IMDUR) 60 MG 24 hr tablet Take 60 mg by mouth daily.     Marland Kitchen loratadine (CLARITIN) 10 MG tablet Take 10 mg by mouth daily as needed.     . meloxicam (MOBIC) 7.5 MG tablet     . metFORMIN (GLUCOPHAGE) 500 MG tablet Take 500 mg by mouth 2 (two) times daily with a meal.    . nitroGLYCERIN (NITROSTAT) 0.4 MG SL tablet Place 0.4 mg under the tongue.    . OXYGEN Inhale into the lungs. 2 liters @@ 24 hours daily.    . traMADol (ULTRAM) 50 MG tablet TAKE 1 TABLET BY MOUTH TWICE DAILY AS NEEDED FOR PAIN 60 tablet 2  . traZODone (DESYREL) 100 MG tablet Take 100 mg  by mouth at bedtime.     Marland Kitchen umeclidinium bromide (INCRUSE ELLIPTA) 62.5 MCG/INH AEPB Inhale 1 puff into the lungs daily. 1 each 11   No current facility-administered medications for this visit.      Allergies:   Codeine   Social History:  The patient  reports that he has been smoking cigarettes.  He has a 11.25 pack-year smoking history. he has never used smokeless tobacco. He reports that he drinks alcohol. He reports that he does not use drugs.   Family History:   family history includes Heart attack (age of onset: 32) in his father; Heart disease in his father; Hyperlipidemia in his father and mother; Hypertension in his father and mother.    Review of Systems: Review of Systems  Constitutional: Negative.   Respiratory: Positive for shortness of breath.   Cardiovascular: Positive for chest pain.  Gastrointestinal: Negative.   Musculoskeletal: Negative.   Neurological:  Negative.   Psychiatric/Behavioral: Negative.   All other systems reviewed and are negative.    PHYSICAL EXAM: VS:  BP 132/76 (BP Location: Left Arm, Patient Position: Sitting, Cuff Size: Large)   Pulse 85   Ht 5\' 10"  (1.778 m)   Wt 260 lb 8 oz (118.2 kg)   BMI 37.38 kg/m  , BMI Body mass index is 37.38 kg/m. GEN: Well nourished, well developed, in no acute distress , obese HEENT: normal  Neck: no JVD, carotid bruits, or masses Cardiac: RRR;rare ectopy,  1+ SEM RSB,  no rubs, or gallops,no edema  Respiratory:  moderately decreased breath sounds throughout, scant wheezing , normal work of breathing GI: soft, nontender, nondistended, + BS MS: no deformity or atrophy  Skin: warm and dry, no rash Neuro:  Strength and sensation are intact Psych: euthymic mood, full affect    Recent Labs: 10/25/2016: ALT 28 08/14/2017: BUN 15; Creatinine, Ser 0.68; Hemoglobin 14.8; Platelets 103; Potassium 3.8; Sodium 132    Lipid Panel Lab Results  Component Value Date   CHOL 199 10/25/2016   HDL 56 10/25/2016   LDLCALC 120 (H) 10/25/2016   TRIG 114 10/25/2016      Wt Readings from Last 3 Encounters:  10/15/17 260 lb 8 oz (118.2 kg)  09/30/17 259 lb (117.5 kg)  09/19/17 257 lb (116.6 kg)       ASSESSMENT AND PLAN:  Coronary artery disease involving native coronary artery of native heart with angina pectoris with documented spasm (HCC) - Negative stress test 1 year ago February 2018 No significant escalation in symptoms, rare atypical chest pain on the left Continue nitro as needed  Essential hypertension - Blood pressure is well controlled on today's visit. No changes made to the medications. Stable  SOB (shortness of breath) - Severe underlying COPD, coronary artery disease, obesity, deconditioning Recommended regular exercise program  Chest tightness -  Atypical, as above No further testing  Stable angina (HCC) Stable anginal symptoms treated with long-acting nitrates  and beta-blockers Will start Lipitor 10 mg with titration up to 20 mg if tolerated  Centrilobular emphysema (HCC) Followed by pulmonary, on 3 inhalers Reports symptoms are stable  Morbid obesity ([place]) Unable to exercise as he is very short of breath on exertion Chronic back pain, joint pain   Smoker Continues to smoke We have encouraged him to continue to work on weaning his cigarettes and smoking cessation. He will continue to work on this and does not want any assistance with chantix.     Total encounter time more than 25 minutes  Greater than 50% was spent in counseling and coordination of care with the patient   Disposition:   F/U  12 months   Orders Placed This Encounter  Procedures  . EKG 12-Lead     Signed, Esmond Plants, M.D., Ph.D. 10/15/2017  [place], [place]

## 2017-10-15 ENCOUNTER — Encounter: Payer: Self-pay | Admitting: Cardiovascular Disease

## 2017-10-15 ENCOUNTER — Ambulatory Visit (INDEPENDENT_AMBULATORY_CARE_PROVIDER_SITE_OTHER): Payer: Medicare Other | Admitting: Cardiovascular Disease

## 2017-10-15 VITALS — BP 132/76 | HR 85 | Ht 70.0 in | Wt 260.5 lb

## 2017-10-15 DIAGNOSIS — I10 Essential (primary) hypertension: Secondary | ICD-10-CM

## 2017-10-15 DIAGNOSIS — R0602 Shortness of breath: Secondary | ICD-10-CM | POA: Diagnosis not present

## 2017-10-15 DIAGNOSIS — J432 Centrilobular emphysema: Secondary | ICD-10-CM | POA: Diagnosis not present

## 2017-10-15 DIAGNOSIS — I209 Angina pectoris, unspecified: Secondary | ICD-10-CM

## 2017-10-15 DIAGNOSIS — I25111 Atherosclerotic heart disease of native coronary artery with angina pectoris with documented spasm: Secondary | ICD-10-CM

## 2017-10-15 DIAGNOSIS — I714 Abdominal aortic aneurysm, without rupture, unspecified: Secondary | ICD-10-CM

## 2017-10-15 DIAGNOSIS — I723 Aneurysm of iliac artery: Secondary | ICD-10-CM

## 2017-10-15 DIAGNOSIS — E782 Mixed hyperlipidemia: Secondary | ICD-10-CM

## 2017-10-15 MED ORDER — ATORVASTATIN CALCIUM 20 MG PO TABS: 20.0000 mg | ORAL_TABLET | Freq: Every day | ORAL | 11 refills | Status: DC

## 2017-10-15 NOTE — Patient Instructions (Addendum)
Medication Instructions:   Consider starting the lipitor /atorvastatin 20 mg daily  cut in 1/2 daily to start,  Work up slowly  Labwork:  No new labs needed  Testing/Procedures:  No further testing at this time   Follow-Up: It was a pleasure seeing you in the office today. Please call us if you have new issues that need to be addressed before your next appt.  [phone]  Your physician wants you to follow-up in: 12 months.  You will receive a reminder letter in the mail two months in advance. If you don't receive a letter, please call our office to schedule the follow-up appointment.  If you need a refill on your cardiac medications before your next appointment, please call your pharmacy.

## 2017-11-04 ENCOUNTER — Other Ambulatory Visit: Payer: Self-pay

## 2017-11-04 MED ORDER — MELOXICAM 7.5 MG PO TABS: ORAL_TABLET | ORAL | 1 refills | Status: DC

## 2017-11-04 MED ORDER — TRAZODONE HCL 100 MG PO TABS: 100.0000 mg | ORAL_TABLET | Freq: Every day | ORAL | 3 refills | Status: DC

## 2017-11-07 ENCOUNTER — Other Ambulatory Visit: Payer: Self-pay

## 2017-11-07 MED ORDER — CYCLOBENZAPRINE HCL 10 MG PO TABS: 10.0000 mg | ORAL_TABLET | Freq: Two times a day (BID) | ORAL | 2 refills | Status: DC | PRN

## 2017-12-02 ENCOUNTER — Other Ambulatory Visit: Payer: Self-pay

## 2017-12-02 MED ORDER — ALBUTEROL SULFATE HFA 108 (90 BASE) MCG/ACT IN AERS: 2.0000 | INHALATION_SPRAY | Freq: Four times a day (QID) | RESPIRATORY_TRACT | 5 refills | Status: DC | PRN

## 2017-12-02 MED ORDER — ATENOLOL 25 MG PO TABS: 25.0000 mg | ORAL_TABLET | Freq: Every day | ORAL | 11 refills | Status: DC

## 2017-12-02 MED ORDER — METFORMIN HCL 500 MG PO TABS: 500.0000 mg | ORAL_TABLET | Freq: Two times a day (BID) | ORAL | 1 refills | Status: DC

## 2017-12-02 MED ORDER — CYCLOBENZAPRINE HCL 10 MG PO TABS: 10.0000 mg | ORAL_TABLET | Freq: Two times a day (BID) | ORAL | 2 refills | Status: DC | PRN

## 2017-12-02 MED ORDER — ALLOPURINOL 100 MG PO TABS: 100.0000 mg | ORAL_TABLET | Freq: Two times a day (BID) | ORAL | 1 refills | Status: DC

## 2017-12-26 ENCOUNTER — Telehealth: Payer: Self-pay | Admitting: Internal Medicine

## 2017-12-26 NOTE — Telephone Encounter (Signed)
Prior auth for [employer] denied

## 2017-12-30 ENCOUNTER — Other Ambulatory Visit: Payer: Self-pay | Admitting: Internal Medicine

## 2017-12-30 ENCOUNTER — Other Ambulatory Visit: Payer: Self-pay | Admitting: Nurse Practitioner

## 2017-12-30 MED ORDER — MELOXICAM 7.5 MG PO TABS: ORAL_TABLET | ORAL | 1 refills | Status: DC

## 2017-12-30 MED ORDER — TRAMADOL HCL 50 MG PO TABS: 50.0000 mg | ORAL_TABLET | Freq: Two times a day (BID) | ORAL | 4 refills | Status: DC | PRN

## 2018-01-09 ENCOUNTER — Encounter: Payer: Self-pay | Admitting: Nurse Practitioner

## 2018-01-09 ENCOUNTER — Ambulatory Visit (INDEPENDENT_AMBULATORY_CARE_PROVIDER_SITE_OTHER): Payer: Medicare HMO | Admitting: Nurse Practitioner

## 2018-01-09 VITALS — BP 123/72 | HR 75 | Resp 16 | Ht 70.0 in | Wt 260.0 lb

## 2018-01-09 DIAGNOSIS — F17200 Nicotine dependence, unspecified, uncomplicated: Secondary | ICD-10-CM | POA: Insufficient documentation

## 2018-01-09 DIAGNOSIS — E1165 Type 2 diabetes mellitus with hyperglycemia: Secondary | ICD-10-CM | POA: Insufficient documentation

## 2018-01-09 DIAGNOSIS — I10 Essential (primary) hypertension: Secondary | ICD-10-CM

## 2018-01-09 DIAGNOSIS — G4733 Obstructive sleep apnea (adult) (pediatric): Secondary | ICD-10-CM

## 2018-01-09 DIAGNOSIS — J42 Unspecified chronic bronchitis: Secondary | ICD-10-CM | POA: Diagnosis not present

## 2018-01-09 LAB — POCT GLYCOSYLATED HEMOGLOBIN (HGB A1C): Hemoglobin A1C: 9.2

## 2018-01-09 MED ORDER — ALBUTEROL SULFATE HFA 108 (90 BASE) MCG/ACT IN AERS: 2.0000 | INHALATION_SPRAY | Freq: Four times a day (QID) | RESPIRATORY_TRACT | 5 refills | Status: DC | PRN

## 2018-01-09 MED ORDER — GLIMEPIRIDE 1 MG PO TABS
2.0000 mg | ORAL_TABLET | Freq: Every day | ORAL | 5 refills | Status: DC
Start: 2018-01-09 — End: 2018-01-27

## 2018-01-09 NOTE — Progress Notes (Signed)
[facility] [place], [place] [address]  Internal MEDICINE  Office Visit Note  Patient Name: Lawrence Steele  [id number]  [id number]  Date of Service: 01/09/2018   Pt is here for routine follow up.   Chief Complaint  Patient presents with  . Diabetes    added glimepiride at last visit.    Diabetes  He presents for his follow-up diabetic visit. He has type 2 diabetes mellitus. No MedicAlert identification noted. His disease course has been worsening. Hypoglycemia symptoms include headaches and nervousness/anxiousness. Associated symptoms include fatigue. Pertinent negatives for diabetes include no blurred vision, no chest pain, no polydipsia, no polyuria and no visual change. There are no hypoglycemic complications. Symptoms are stable. Diabetic complications include heart disease. Risk factors for coronary artery disease include diabetes mellitus, dyslipidemia, hypertension, male sex and obesity. Current diabetic treatment includes oral agent (dual therapy). He is compliant with treatment some of the time. His weight is stable. He is following a generally healthy diet. When asked about meal planning, he reported none. He has not had a previous visit with a dietitian. He rarely participates in exercise. There is no change in his home blood glucose trend. An ACE inhibitor/angiotensin II receptor blocker is being taken. He does not see a podiatrist.Eye exam is not current.       Current Medication: Outpatient Encounter Medications as of 01/09/2018  Medication Sig  . acetaminophen (TYLENOL) 500 MG tablet Take 500 mg by mouth every 6 (six) hours as needed.   Marland Kitchen albuterol (PROAIR HFA) 108 (90 Base) MCG/ACT inhaler Inhale 2 puffs into the lungs every 6 (six) hours as needed.  Marland Kitchen allopurinol (ZYLOPRIM) 100 MG tablet Take 1 tablet (100 mg total) by mouth 2 (two) times daily.  Marland Kitchen aspirin 81 MG tablet Take 81 mg by mouth daily.   Marland Kitchen atenolol (TENORMIN) 25 MG tablet Take 1  tablet (25 mg total) by mouth daily.  Marland Kitchen atorvastatin (LIPITOR) 20 MG tablet Take 1 tablet (20 mg total) by mouth daily.  . cyclobenzaprine (FLEXERIL) 10 MG tablet Take 1 tablet (10 mg total) by mouth 2 (two) times daily as needed for muscle spasms.  Marland Kitchen doxycycline (VIBRA-TABS) 100 MG tablet Take 1 tablet (100 mg total) by mouth daily. For acne.  . Fluticasone-Salmeterol (ADVAIR DISKUS) 250-50 MCG/DOSE AEPB Inhale 1 puff into the lungs 2 (two) times daily. Frequency:BID   Dosage:0.0     Instructions:  Note:Dose: 250-50MCG  . glimepiride (AMARYL) 1 MG tablet Take 2 tablets (2 mg total) by mouth daily with supper.  Marland Kitchen ipratropium (ATROVENT) 0.02 % nebulizer solution Use every 6 hours for Shortness of breath and wheezing and as needed.  Prescription is for 90 days  . isosorbide mononitrate (IMDUR) 60 MG 24 hr tablet Take 60 mg by mouth daily.   Marland Kitchen loratadine (CLARITIN) 10 MG tablet Take 10 mg by mouth daily as needed.   . meloxicam (MOBIC) 7.5 MG tablet Take 1 tab twice a day  . metFORMIN (GLUCOPHAGE) 500 MG tablet Take 1 tablet (500 mg total) by mouth 2 (two) times daily with a meal.  . nitroGLYCERIN (NITROSTAT) 0.4 MG SL tablet Place 0.4 mg under the tongue.  . OXYGEN Inhale into the lungs. 2 liters @@ 24 hours daily.  . traMADol (ULTRAM) 50 MG tablet Take 1 tablet (50 mg total) by mouth 2 (two) times daily as needed. for pain  . traZODone (DESYREL) 100 MG tablet Take 1 tablet (100 mg total) by mouth at bedtime.  Marland Kitchen  umeclidinium bromide (INCRUSE ELLIPTA) 62.5 MCG/INH AEPB Inhale 1 puff into the lungs daily.  . [DISCONTINUED] albuterol (PROAIR HFA) 108 (90 Base) MCG/ACT inhaler Inhale 2 puffs into the lungs every 6 (six) hours as needed.  . [DISCONTINUED] glimepiride (AMARYL) 1 MG tablet Take 1 tablet (1 mg total) by mouth daily with supper.   No facility-administered encounter medications on file as of 01/09/2018.     Surgical History: Past Surgical History:  Procedure Laterality Date  . ABDOMINAL  AORTIC ANEURYSM REPAIR  03/06/2015   [place]    . CARDIAC CATHETERIZATION  2010,03/16/2003,11/04/2002  . CORONARY ANGIOPLASTY  03/16/2003   stent to the RCA  & 2/18/2004stent mid circumflex  . ELBOW SURGERY    . HERNIA REPAIR    . PENILE PROSTHESIS IMPLANT  1995  . TONSILECTOMY, ADENOIDECTOMY, BILATERAL MYRINGOTOMY AND TUBES      Medical History: Past Medical History:  Diagnosis Date  . AAA (abdominal aortic aneurysm) ([place])   . AAA (abdominal aortic aneurysm) ([place])   . Anxiety   . Asthma   . CHF (congestive heart failure) ([place])   . COPD (chronic obstructive pulmonary disease) ([place])   . Coronary artery disease   . Diabetes mellitus without complication ([place])   . ESRD (end stage renal disease) ([place])   . Hyperlipidemia   . Hypertension   . Iliac artery aneurysm, bilateral ([place])   . Obstructive sleep apnea-hypopnea syndrome   . Sleep apnea     Family History: Family History  Problem Relation Age of Onset  . Hypertension Mother   . Hyperlipidemia Mother   . Heart attack Father 37  . Heart disease Father   . Hypertension Father   . Hyperlipidemia Father     Social History   Socioeconomic History  . Marital status: Divorced    Spouse name: Not on file  . Number of children: Not on file  . Years of education: Not on file  . Highest education level: Not on file  Occupational History  . Not on file  Social Needs  . Financial resource strain: Not on file  . Food insecurity:    Worry: Not on file    Inability: Not on file  . Transportation needs:    Medical: Not on file    Non-medical: Not on file  Tobacco Use  . Smoking status: Current Every Day Smoker    Packs/day: 0.25    Years: 45.00    Pack years: 11.25    Types: Cigarettes  . Smokeless tobacco: Never Used  Substance and Sexual Activity  . Alcohol use: Yes    Comment: 2 half gallons of vodka  . Drug use: No  . Sexual activity: Not on file  Lifestyle  . Physical activity:    Days  per week: Not on file    Minutes per session: Not on file  . Stress: Not on file  Relationships  . Social connections:    Talks on phone: Not on file    Gets together: Not on file    Attends religious service: Not on file    Active member of club or organization: Not on file    Attends meetings of clubs or organizations: Not on file    Relationship status: Not on file  . Intimate partner violence:    Fear of current or ex partner: Not on file    Emotionally abused: Not on file    Physically abused: Not on file  Forced sexual activity: Not on file  Other Topics Concern  . Not on file  Social History Narrative  . Not on file      Review of Systems  Constitutional: Positive for fatigue. Negative for activity change and unexpected weight change.  HENT: Positive for postnasal drip and rhinorrhea. Negative for sore throat.   Eyes: Negative.  Negative for blurred vision.  Respiratory: Positive for cough and shortness of breath. Negative for wheezing.   Cardiovascular: Negative for chest pain and palpitations.  Gastrointestinal: Negative for nausea and vomiting.       Decreased appetite  Endocrine: Negative for polydipsia and polyuria.       Patient with history of diabetes. Does not check blood sugars at home. Feels good, though. Admits he forgets to take both doses of medications some time. Knows that his blood sugars have been elevated .  Musculoskeletal: Positive for arthralgias, back pain and myalgias.  Skin: Negative for rash.  Allergic/Immunologic: Positive for environmental allergies.  Neurological: Positive for headaches.  Hematological: Negative for adenopathy.  Psychiatric/Behavioral: Positive for dysphoric mood. The patient is nervous/anxious.     Today's Vitals   01/09/18 0855  BP: 123/72  Pulse: 75  Resp: 16  SpO2: 93%  Weight: 260 lb (117.9 kg)  Height: 5\' 10"  (1.778 m)    Physical Exam  Constitutional: He is oriented to person, place, and time. He  appears well-developed and well-nourished.  HENT:  Head: Normocephalic.  Eyes: Pupils are equal, round, and reactive to light. EOM are normal.  Neck: Normal range of motion. Neck supple. Carotid bruit is not present.  Cardiovascular: Normal rate. An irregular rhythm present. Frequent extrasystoles are present.  Murmur heard.  Systolic murmur is present with a grade of 2/6. Irregular heart rhythm.   Pulmonary/Chest: Effort normal. No accessory muscle usage. No respiratory distress. He has wheezes. He has rhonchi in the right upper field, the right middle field, the left upper field and the left middle field.  Congested cough which clears rhonchi in lung fields.   Abdominal: Soft. Bowel sounds are normal. There is no tenderness.  Musculoskeletal: Normal range of motion.  Neurological: He is alert and oriented to person, place, and time.  Skin: Skin is warm and dry.  Psychiatric: He has a normal mood and affect. His behavior is normal. Judgment and thought content normal.  Nursing note and vitals reviewed.   Assessment/Plan: 1. Uncontrolled type 2 diabetes mellitus with hyperglycemia (HCC) - POCT HgB A1C 9.2. Increased glimepiride to 2 tabets daily. Continue metformin 500mg  twice daily. Emphasized importance of diabetic diet.  - glimepiride (AMARYL) 1 MG tablet; Take 2 tablets (2 mg total) by mouth daily with supper.  Dispense: 60 tablet; Refill: 5  2. Chronic bronchitis, unspecified chronic bronchitis type ([place]) Continue maintenance inhaler as prescribed. Continue rescue inhaler as needed and as prescribed  - albuterol (PROAIR HFA) 108 (90 Base) MCG/ACT inhaler; Inhale 2 puffs into the lungs every 6 (six) hours as needed.  Dispense: 3 Inhaler; Refill: 5  3. Essential hypertension Well controlled  4. Obstructive sleep apnea (adult) (pediatric) Regular visits with Dr. Devona Konig for CPAP management  5. Nicotine dependence with current use Advised patient to stop smoking.  General  Counseling: Fernandez verbalizes understanding of the findings of todays visit and agrees with plan of treatment. I have discussed any further diagnostic evaluation that may be needed or ordered today. We also reviewed his medications today. he has been encouraged to call the office with any  questions or concerns that should arise related to todays visit.  Diabetes Counseling:  1. Addition of ACE inh/ ARB'S for nephroprotection. 2. Diabetic foot care, prevention of complications.  3.Exercise and lose weight.  4. Diabetic eye examination, 5. Monitor blood sugar closlely. nutrition counseling.  6.Sign and symptoms of hypoglycemia including shaking sweating,confusion and headaches.   This patient was seen by [name], FNP- C in Collaboration with Dr Lavera Guise as a part of collaborative care agreement    Orders Placed This Encounter  Procedures  . POCT HgB A1C    Meds ordered this encounter  Medications  . albuterol (PROAIR HFA) 108 (90 Base) MCG/ACT inhaler    Sig: Inhale 2 puffs into the lungs every 6 (six) hours as needed.    Dispense:  3 Inhaler    Refill:  5    Please change back to ventolin    Order Specific Question:   Supervising Provider    Answer:   Lavera Guise [place]  . glimepiride (AMARYL) 1 MG tablet    Sig: Take 2 tablets (2 mg total) by mouth daily with supper.    Dispense:  60 tablet    Refill:  5    Please note increased dose. Patient does not need fill at this time.    Order Specific Question:   Supervising Provider    Answer:   Lavera Guise [[id number]]    Time spent: 58 Minutes     Dr Lavera Guise Internal medicine

## 2018-01-13 ENCOUNTER — Other Ambulatory Visit: Payer: Self-pay | Admitting: Pharmacist

## 2018-01-13 ENCOUNTER — Other Ambulatory Visit: Payer: Self-pay | Admitting: Pharmacy Technician

## 2018-01-13 DIAGNOSIS — E1165 Type 2 diabetes mellitus with hyperglycemia: Secondary | ICD-10-CM

## 2018-01-13 NOTE — Patient Outreach (Addendum)
[place] [facility]) Care Management  01/13/2018  DEROY NOAH 04/02/1951 [id number]   Received referral for [place] patient assistance from [facility] for patients Ventolin, Incruse Ellipta and Advair. Mailed out patient portion of application today. Faxed provider portion of application to Dr. Humphrey Rolls.  Will follow up with patient in next 7-10 days to verify receipt of application.   Maud Deed [place], [place] Management [phone]

## 2018-01-13 NOTE — Patient Outreach (Addendum)
[place] [facility]) Care Management  01/13/2018  Lawrence Steele 27-Sep-1950 [id number]   Incoming call from Lawrence Steele regarding medication assistance. HIPAA identifiers verified and verbal consent received.  I worked with [name] last year to help him to get patient assistance for his inhalers through the [employer] Patient Assistance Program. [name] calls today to let me know that he would like my assistance with applying for this program again for the 2019 calendar year. Reports that he has just picked up an out of pocket expense report from his pharmacy and he has now met the out of pocket requirement for the calendar year. Let patient know that I will ask Lawrence Steele to reach out to him to get this application started.  [name] reports that at his latest PCP visit, his glimepiride dose was increased to glimepiride 2 mg daily with supper. Patient reports adherence to this change without issue. Note that glimepiride is included in the 2019 [place] list as a medication that is potentially harmful in the elderly due to risk of prolonged hypoglycemia. Will send message to provider to request that she consider using glipizide, which has a shorter half life, as an alternative.  Patient reports that he is in need of new testing supplies. Patient reports that he currently has a Lawrence Steele, structural. Note that through patient's [employer] coverage, the Lawrence Steele and Lawrence Steele meters are preferred. Will send patient's provider, Lawrence Steele, my note and a message requesting that she send a prescription for Lawrence of these covered meters with testing supplies to [place] for the patient.  Patient also expresses interest in speaking with a [place] regarding his diabetes. Will send a referral for a [facility] RNCM reach out to Lawrence Steele.   PLAN  1) Will send an InBasket message to Lawrence Steele asking her to reach out to Mr.  Steele to work with him in applying for assistance with the cost of his Advair, Ventolin and Incruse from the Lawrence Steele Patient Assistance Program.  2) Will send a referral to Lawrence Steele for diabetes education and nursing assessment.   3) Will send a message to patient's PCP to request that provider send a prescription for an Lawrence Steele or Lawrence Steele glucometer to [place] for the patient. Will also request that provider consider prescribing the patient glipizide as an alternative to glimepiride to reduce the patient's risk of prolonged hypoglycemia.  Harlow Asa, PharmD, [place] Management [phone]

## 2018-01-21 ENCOUNTER — Other Ambulatory Visit: Payer: Self-pay | Admitting: *Deleted

## 2018-01-21 NOTE — Patient Outreach (Signed)
[place] [facility]) Care Management  01/21/2018  Lawrence Steele 1951/04/21 [id number]  Successful telephone encounter to [name], 67 year old male- follow up on referral received  On 01/17/18 from [place] pharmacist for Community chronic CM- diabetic education.  Spoke with pt, HIPAA identifiers verified discussed reason for call- follow up on referral.   Pt  Reports could use help with his diabetes, sugars running high, daughter recently checked his  Sugar and it was 355.  Pt reports not surprised it is high, had a bowl of ice cream 1.5 hours  Earlier.   Pt reports he does not check his sugars as does not have a glucometer, [facility]  Pharmacist was suppose to follow up with his insurance.  RN CM discussed with pt view  In EMR [facility] pharmacist requested PCP send prescription for new glucometer, to follow  Up with his pharmacy.    Pt reports most recent A1C was 9.0 down from 9.42    RN CM discussed with pt doing a home visit provide diabetic education to which pt agreed.   Plan:  As discussed with pt, plan to follow up again next week - initial home visit.    Zara Chess.   [place] Management  [phone]  Addendum- 3:22 pm follow up call to pt, HIPAA identifiers provided, correction to  Earlier statement by RN CM about [facility] pharmacist requesting  PCP to send prescription  for new glucometer/supplies to his pharmacy, instead request was to  [facility] order  Pharmacy.   Pt voiced understanding.    Zara Chess.   [place] Management  [phone]  Glucometer/supplies be sent to his pharmacy, instead it was

## 2018-01-22 ENCOUNTER — Other Ambulatory Visit: Payer: Self-pay | Admitting: Pharmacy Technician

## 2018-01-22 ENCOUNTER — Telehealth: Payer: Self-pay | Admitting: Nurse Practitioner

## 2018-01-22 ENCOUNTER — Other Ambulatory Visit: Payer: Self-pay | Admitting: Internal Medicine

## 2018-01-22 MED ORDER — METRONIDAZOLE 500 MG PO TABS: 500.0000 mg | ORAL_TABLET | Freq: Two times a day (BID) | ORAL | 0 refills | Status: DC

## 2018-01-22 MED ORDER — CIPROFLOXACIN HCL 500 MG PO TABS: 500.0000 mg | ORAL_TABLET | Freq: Two times a day (BID) | ORAL | 0 refills | Status: DC

## 2018-01-22 NOTE — Patient Outreach (Signed)
[facility]) Care Management  01/22/2018  Lawrence Steele 01-28-1951 [id number]   Successful outreach call to patient, HIPAA identifiers verified. Patient states he has not received [place] patient assistance application that was mailed out to him on 04/29. 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 Patient stated that he understood.   Will follow up with patient on Monday to verify he received application and if he hasn't will mail out another copy.  Maud Deed [place], [place] Management [phone]

## 2018-01-22 NOTE — Telephone Encounter (Signed)
Patient states that he is having a diverticulitis flare up , he states that he ate corn on Sunday and by Monday morning he has had abdominal pain, no nausea , no vomiting , no diarrhea , patient states that he is drinking plenty of water.

## 2018-01-24 ENCOUNTER — Other Ambulatory Visit: Payer: Self-pay

## 2018-01-24 ENCOUNTER — Other Ambulatory Visit: Payer: Self-pay | Admitting: Pharmacist

## 2018-01-24 MED ORDER — ACCU-CHEK AVIVA PLUS W/DEVICE KIT: PACK | 3 refills | Status: DC

## 2018-01-24 MED ORDER — GLUCOSE BLOOD VI STRP: 1.0000 | ORAL_STRIP | 3 refills | Status: DC | PRN

## 2018-01-24 NOTE — Patient Outreach (Signed)
[place] [facility]) Care Management  01/24/2018  Lawrence Steele [birth year] [id number]   Call to follow up on request that provider send a prescription for an Accu-chek or True Metrix glucometer to [place] for the patient. Leave a message with nursing. If have not heard back from the office by 01/27/18, will call to follow up again at that time.  Harlow Asa, PharmD, [place] Management [phone]

## 2018-01-27 ENCOUNTER — Other Ambulatory Visit: Payer: Self-pay

## 2018-01-27 DIAGNOSIS — E1165 Type 2 diabetes mellitus with hyperglycemia: Secondary | ICD-10-CM

## 2018-01-27 MED ORDER — GLIMEPIRIDE 1 MG PO TABS: 2.0000 mg | ORAL_TABLET | Freq: Every day | ORAL | 5 refills | Status: DC

## 2018-01-29 ENCOUNTER — Other Ambulatory Visit: Payer: Self-pay | Admitting: Pharmacy Technician

## 2018-01-29 NOTE — Patient Outreach (Addendum)
[place]) Care Management  01/24/2018  Lawrence Steele 07/16/1951 [id number]   Receive a voicemail message from [facility] at [facility] letting me know that she received my messages requesting a prescription for an Accu-chek or True Metrix glucometer to [place] for the patient. Ria Comment reports that the prescription for the Accu-Chek Aviva Plus meter and supplies have now been sent to [place] for the patient.  PLAN  Will continue to follow with [place] Technician [name] as she assists patient with patient assistance application.  Harlow Asa, PharmD, [place] Management [phone]

## 2018-01-29 NOTE — Patient Outreach (Signed)
[facility]) Care Management  01/29/2018  Lawrence Steele 04/30/1951 [id number]   Successful outreach call to patient in regards to application. HIPAA identifiers not verified due to not mentioning any drug names. Patient stated he is on the way to get his printout from drug store and that he would put application in the mail today.  Will follow up with patient in the next 2-3 weeks when I receive an update from [place] patient assistance.  Maud Deed [place], [place] Management [phone]

## 2018-01-30 ENCOUNTER — Encounter: Payer: Self-pay | Admitting: Internal Medicine

## 2018-01-30 ENCOUNTER — Other Ambulatory Visit: Payer: Self-pay

## 2018-01-30 ENCOUNTER — Ambulatory Visit: Payer: Medicare HMO | Admitting: Internal Medicine

## 2018-01-30 VITALS — BP 114/71 | HR 82 | Resp 16 | Ht 70.0 in | Wt 257.4 lb

## 2018-01-30 DIAGNOSIS — R05 Cough: Secondary | ICD-10-CM

## 2018-01-30 DIAGNOSIS — J441 Chronic obstructive pulmonary disease with (acute) exacerbation: Secondary | ICD-10-CM

## 2018-01-30 DIAGNOSIS — Z9989 Dependence on other enabling machines and devices: Secondary | ICD-10-CM | POA: Diagnosis not present

## 2018-01-30 DIAGNOSIS — J9611 Chronic respiratory failure with hypoxia: Secondary | ICD-10-CM

## 2018-01-30 DIAGNOSIS — R059 Cough, unspecified: Secondary | ICD-10-CM

## 2018-01-30 DIAGNOSIS — G4733 Obstructive sleep apnea (adult) (pediatric): Secondary | ICD-10-CM

## 2018-01-30 MED ORDER — AZITHROMYCIN 250 MG PO TABS: ORAL_TABLET | ORAL | 0 refills | Status: DC

## 2018-01-30 MED ORDER — ACCU-CHEK SOFTCLIX LANCETS MISC: 1 refills | Status: DC

## 2018-01-30 NOTE — Patient Instructions (Signed)

## 2018-01-30 NOTE — Progress Notes (Signed)
[facility] [place], [place] [address]  Pulmonary Sleep Medicine   Office Visit Note  Patient Name: Lawrence Steele DOB: 1951/08/19 MRN [id number]  Date of Service: 01/30/2018  Complaints/HPI: Patient has been having a little bit of a cough has some congestion and some postnasal drip.  Bringing up some phlegm at this time no hemoptysis is noted.  Denies having any chest pain no fevers.  Patient has not had any admissions to the hospital noted.  Still smoking unfortunately.  Continues to uses inhalers as prescribed.  Also as far as the sleep is concerned he is doing well with the CPAP device.  He should continue with current pressures as set  ROS  General: (-) fever, (-) chills, (-) night sweats, (-) weakness Skin: (-) rashes, (-) itching,. Eyes: (-) visual changes, (-) redness, (-) itching. Nose and Sinuses: (-) nasal stuffiness or itchiness, (-) postnasal drip, (-) nosebleeds, (-) sinus trouble. Mouth and Throat: (-) sore throat, (-) hoarseness. Neck: (-) swollen glands, (-) enlarged thyroid, (-) neck pain. Respiratory: + cough, (-) bloody sputum, - shortness of breath, - wheezing. Cardiovascular: - ankle swelling, (-) chest pain. Lymphatic: (-) lymph node enlargement. Neurologic: (-) numbness, (-) tingling. Psychiatric: (-) anxiety, (-) depression   Current Medication: Outpatient Encounter Medications as of 01/30/2018  Medication Sig  . acetaminophen (TYLENOL) 500 MG tablet Take 500 mg by mouth every 6 (six) hours as needed.   Marland Kitchen albuterol (PROAIR HFA) 108 (90 Base) MCG/ACT inhaler Inhale 2 puffs into the lungs every 6 (six) hours as needed.  Marland Kitchen allopurinol (ZYLOPRIM) 100 MG tablet Take 1 tablet (100 mg total) by mouth 2 (two) times daily.  Marland Kitchen aspirin 81 MG tablet Take 81 mg by mouth daily.   Marland Kitchen atenolol (TENORMIN) 25 MG tablet Take 1 tablet (25 mg total) by mouth daily.  Marland Kitchen atorvastatin (LIPITOR) 20 MG tablet Take 1 tablet (20 mg total) by mouth daily.  .  Blood Glucose Monitoring Suppl (ACCU-CHEK AVIVA PLUS) w/Device KIT Use once daily to check blood sugar.  . ciprofloxacin (CIPRO) 500 MG tablet Take 1 tablet (500 mg total) by mouth 2 (two) times daily.  . cyclobenzaprine (FLEXERIL) 10 MG tablet Take 1 tablet (10 mg total) by mouth 2 (two) times daily as needed for muscle spasms.  Marland Kitchen doxycycline (VIBRA-TABS) 100 MG tablet Take 1 tablet (100 mg total) by mouth daily. For acne.  . Fluticasone-Salmeterol (ADVAIR DISKUS) 250-50 MCG/DOSE AEPB Inhale 1 puff into the lungs 2 (two) times daily. Frequency:BID   Dosage:0.0     Instructions:  Note:Dose: 250-50MCG  . glimepiride (AMARYL) 1 MG tablet Take 2 tablets (2 mg total) by mouth daily with supper.  . Glucose Blood (ACCU-CHEK AVIVA PLUS VI) by In Vitro route. Pt needs accu check Aviva plus lancets, test strips and meter to check blood sugars once daily  . glucose blood (ACCU-CHEK AVIVA PLUS) test strip 1 each by Other route as needed for other. Use as instructed  . ipratropium (ATROVENT) 0.02 % nebulizer solution Use every 6 hours for Shortness of breath and wheezing and as needed.  Prescription is for 90 days  . isosorbide mononitrate (IMDUR) 60 MG 24 hr tablet Take 60 mg by mouth daily.   Marland Kitchen loratadine (CLARITIN) 10 MG tablet Take 10 mg by mouth daily as needed.   . meloxicam (MOBIC) 7.5 MG tablet Take 1 tab twice a day  . metFORMIN (GLUCOPHAGE) 500 MG tablet Take 1 tablet (500 mg total) by mouth 2 (  two) times daily with a meal.  . metroNIDAZOLE (FLAGYL) 500 MG tablet Take 1 tablet (500 mg total) by mouth 2 (two) times daily.  . nitroGLYCERIN (NITROSTAT) 0.4 MG SL tablet Place 0.4 mg under the tongue.  . OXYGEN Inhale into the lungs. 2 liters @@ 24 hours daily.  . traMADol (ULTRAM) 50 MG tablet Take 1 tablet (50 mg total) by mouth 2 (two) times daily as needed. for pain  . traZODone (DESYREL) 100 MG tablet Take 1 tablet (100 mg total) by mouth at bedtime.  Marland Kitchen umeclidinium bromide (INCRUSE ELLIPTA) 62.5  MCG/INH AEPB Inhale 1 puff into the lungs daily.   No facility-administered encounter medications on file as of 01/30/2018.     Surgical History: Past Surgical History:  Procedure Laterality Date  . ABDOMINAL AORTIC ANEURYSM REPAIR  03/06/2015   [place]    . CARDIAC CATHETERIZATION  2010,03/16/2003,11/04/2002  . CORONARY ANGIOPLASTY  03/16/2003   stent to the RCA  & 2/18/2004stent mid circumflex  . ELBOW SURGERY    . HERNIA REPAIR    . PENILE PROSTHESIS IMPLANT  1995  . TONSILECTOMY, ADENOIDECTOMY, BILATERAL MYRINGOTOMY AND TUBES      Medical History: Past Medical History:  Diagnosis Date  . AAA (abdominal aortic aneurysm) ([place])   . AAA (abdominal aortic aneurysm) ([place])   . Anxiety   . Asthma   . CHF (congestive heart failure) ([place])   . COPD (chronic obstructive pulmonary disease) ([place])   . Coronary artery disease   . Diabetes mellitus without complication ([place])   . ESRD (end stage renal disease) ([place])   . Hyperlipidemia   . Hypertension   . Iliac artery aneurysm, bilateral ([place])   . Obstructive sleep apnea-hypopnea syndrome   . Sleep apnea     Family History: Family History  Problem Relation Age of Onset  . Hypertension Mother   . Hyperlipidemia Mother   . Heart attack Father 12  . Heart disease Father   . Hypertension Father   . Hyperlipidemia Father     Social History: Social History   Socioeconomic History  . Marital status: Divorced    Spouse name: Not on file  . Number of children: Not on file  . Years of education: Not on file  . Highest education level: Not on file  Occupational History  . Not on file  Social Needs  . Financial resource strain: Not on file  . Food insecurity:    Worry: Not on file    Inability: Not on file  . Transportation needs:    Medical: Not on file    Non-medical: Not on file  Tobacco Use  . Smoking status: Current Every Day Smoker    Packs/day: 0.25    Years: 45.00    Pack years: 11.25     Types: Cigarettes  . Smokeless tobacco: Never Used  Substance and Sexual Activity  . Alcohol use: Yes    Comment: 2 half gallons of vodka  . Drug use: No  . Sexual activity: Not on file  Lifestyle  . Physical activity:    Days per week: Not on file    Minutes per session: Not on file  . Stress: Not on file  Relationships  . Social connections:    Talks on phone: Not on file    Gets together: Not on file    Attends religious service: Not on file    Active member of club or organization: Not on file  Attends meetings of clubs or organizations: Not on file    Relationship status: Not on file  . Intimate partner violence:    Fear of current or ex partner: Not on file    Emotionally abused: Not on file    Physically abused: Not on file    Forced sexual activity: Not on file  Other Topics Concern  . Not on file  Social History Narrative  . Not on file    Vital Signs: Blood pressure 114/71, pulse 82, resp. rate 16, height 5' 10"  (1.778 m), weight 257 lb 6.4 oz (116.8 kg), SpO2 94 %.  Examination: General Appearance: The patient is well-developed, well-nourished, and in no distress. Skin: Gross inspection of skin unremarkable. Head: normocephalic, no gross deformities. Eyes: no gross deformities noted. ENT: ears appear grossly normal no exudates. Neck: Supple. No thyromegaly. No LAD. Respiratory: scattered rhonchi noted. Cardiovascular: Normal S1 and S2 without murmur or rub. Extremities: No cyanosis. pulses are equal. Neurologic: Alert and oriented. No involuntary movements.  LABS: Recent Results (from the past 2160 hour(s))  POCT HgB A1C     Status: Abnormal   Collection Time: 01/09/18  9:46 AM  Result Value Ref Range   Hemoglobin A1C 9.2     Radiology: Mr Lumbar Spine Wo Contrast  Result Date: 09/18/2017 CLINICAL DATA:  Low back pain and bilateral leg pain with numbness and tingling. Lumbar radiculopathy. Several recent falls. EXAM: MRI LUMBAR SPINE WITHOUT  CONTRAST TECHNIQUE: Multiplanar, multisequence MR imaging of the lumbar spine was performed. No intravenous contrast was administered. COMPARISON:  Radiographs dated 07/02/2017 and CT scan of the abdomen dated 12/02/2014 FINDINGS: Segmentation:  Standard. Alignment:  Physiologic. Vertebrae:  Bilateral pars defects at L5.  No spondylolisthesis. Conus medullaris and cauda equina: Conus extends to the L1 level. Conus and cauda equina appear normal. Paraspinal and other soft tissues: Aorto bi-iliac stent graft in place. Otherwise, negative. Disc levels: T12-L1 through L2-3: Disc desiccation. No disc bulges or protrusions. Minimal degenerative changes of the facet joints at L2-3. L3-4: Minimal broad-based disc bulge without neural impingement. Slight degenerative changes of the facet joints, left greater than right. L4-5: Tiny broad-based disc bulge with disc desiccation. No neural impingement. Minimal degenerative changes of the facet joints. L5-S1: Tiny disc bulge with no neural impingement. The disc is not desiccated. Bilateral pars defects without spondylolisthesis. Widely patent neural foramina. IMPRESSION: 1. Bilateral pars defects at L5 without spondylolisthesis. No neural impingement or foraminal stenosis at that level. 2. Mild degeneration of the discs throughout the remainder of the lumbar spine without neural impingement. Electronically Signed   By: Lorriane Shire M.D.   On: 09/18/2017 10:37    No results found.  No results found.    Assessment and Plan: Patient Active Problem List   Diagnosis Date Noted  . Uncontrolled type 2 diabetes mellitus with hyperglycemia ([place]) 01/09/2018  . Nicotine dependence with current use 01/09/2018  . Diabetes mellitus without complication ([place]) 51/76/1607  . Myalgia 09/19/2017  . Vitamin B12 deficiency anemia 09/19/2017  . Common migraine with intractable migraine 09/19/2017  . Chronic hypoxemic respiratory failure ([place]) 09/19/2017  . Peripheral vascular  disease, unspecified ([place]) 09/19/2017  . Dysuria 09/19/2017  . Osteoarthritis 09/19/2017  . Diverticulitis of intestine 09/19/2017  . Cervicalgia 09/19/2017  . Unspecified abdominal hernia without obstruction or gangrene 09/19/2017  . Vasomotor rhinitis 09/19/2017  . Occlusion and stenosis of bilateral carotid arteries 09/19/2017  . Generalized anxiety disorder 09/19/2017  . Malaise 09/19/2017  . Chronic bronchitis ([place])  09/19/2017  . Obstructive sleep apnea (adult) (pediatric) 09/19/2017  . Cardiac arrhythmia 09/19/2017  . Smoker 04/02/2016  . Pain in the chest 08/20/2014  . SOB (shortness of breath) 08/20/2014  . Coronary artery disease involving native coronary artery of native heart with angina pectoris with documented spasm ([place]) 08/20/2014  . Emphysema of lung ([place]) 08/20/2014  . Coronary artery disease involving native coronary artery of native heart with unstable angina pectoris ([place]) 08/20/2014  . AAA (abdominal aortic aneurysm) without rupture ([place]) 08/20/2014  . Iliac artery aneurysm, bilateral ([place]) 08/20/2014  . Hyperlipidemia 08/20/2014  . Hypertension 08/20/2014  . Morbid obesity ([place]) 08/20/2014    1. Cough slightly increased from presiously. Patient will be given a Zpk short course no need for steroids 2. COPD svere disease needs to stop smoking 3. OSA on CPAP doing well 4. Morbid obesity will work on weigh tloss 5. Chronic respiratory failure on oxygen during the daytime 6.  nicotine abuse counseled on smoking cessation today once again  General Counseling: I have discussed the findings of the evaluation and examination with Jayvion.  I have also discussed any further diagnostic evaluation thatmay be needed or ordered today. Ellison verbalizes understanding of the findings of todays visit. We also reviewed his medications today and discussed drug interactions and side effects including but not limited excessive drowsiness and altered mental states. We also discussed  that there is always a risk not just to him but also people around him. he has been encouraged to call the office with any questions or concerns that should arise related to todays visit.    Time spent: 52mn  I have personally obtained a history, examined the patient, evaluated laboratory and imaging results, formulated the assessment and plan and placed orders.    SAllyne Gee MD FKaiser Permanente Sunnybrook Surgery CenterPulmonary and Critical Care Sleep medicine

## 2018-01-31 ENCOUNTER — Encounter: Payer: Self-pay | Admitting: *Deleted

## 2018-01-31 ENCOUNTER — Other Ambulatory Visit: Payer: Self-pay | Admitting: *Deleted

## 2018-01-31 NOTE — Patient Outreach (Addendum)
[facility] ([facility]) Care Management   01/31/2018  Lawrence Steele 01/10/1951 [id number]  Lawrence Steele is an 66 y.o. male  Subjective:  Pt reports on recent visit with Lung MD for cough, Azithromycin called  In, to pick up from pharmacy and start.  Pt reports been trying to do better with  His diet, eating more salad,daughter to switch out his white bread for whole wheat.  Pt reports receives meals on wheels which he noticed has a lot of carbohydrates.   Pt reports checks his sugars 2-3 times a day, today it was 234. Pt reports has not  Received glucometer from [employer] yet, been using one (brand new) his daughter Bought for a  friend  Pt reports current weight is 257 lbs, down from 300 lbs.   Objective:   Vitals:   01/31/18 1029  BP: 110/72  Pulse: 86  Resp: 15  SpO2: 96%   ROS  Physical Exam  Constitutional: He is oriented to person, place, and time. He appears well-developed and well-nourished.  Cardiovascular: Normal rate.  Slight irregularity   Respiratory: Effort normal.  Expiratory wheezes in all fields posterior, anterior- on expiration congestion noted in upper lobes.   GI: Soft. Bowel sounds are normal.  Musculoskeletal: Normal range of motion. He exhibits edema.  Trace bilateral lower extremities   Neurological: He is alert and oriented to person, place, and time.  Skin: Skin is warm and dry.  Psychiatric: He has a normal mood and affect. His behavior is normal. Judgment and thought content normal.    Encounter Medications:   Outpatient Encounter Medications as of 01/31/2018  Medication Sig  . ACCU-CHEK SOFTCLIX LANCETS lancets Use as instructed  Once a daily E11.65  . acetaminophen (TYLENOL) 500 MG tablet Take 500 mg by mouth every 6 (six) hours as needed.   . albuterol (PROAIR HFA) 108 (90 Base) MCG/ACT inhaler Inhale 2 puffs into the lungs every 6 (six) hours as needed.  . allopurinol (ZYLOPRIM) 100 MG tablet Take 1 tablet (100 mg total) by mouth 2  (two) times daily.  . aspirin 81 MG tablet Take 81 mg by mouth daily.   . atenolol (TENORMIN) 25 MG tablet Take 1 tablet (25 mg total) by mouth daily.  . atorvastatin (LIPITOR) 20 MG tablet Take 1 tablet (20 mg total) by mouth daily.  . azithromycin (ZITHROMAX) 250 MG tablet As directed  . Blood Glucose Monitoring Suppl (ACCU-CHEK AVIVA PLUS) w/Device KIT Use once daily to check blood sugar.  . ciprofloxacin (CIPRO) 500 MG tablet Take 1 tablet (500 mg total) by mouth 2 (two) times daily.  . cyclobenzaprine (FLEXERIL) 10 MG tablet Take 1 tablet (10 mg total) by mouth 2 (two) times daily as needed for muscle spasms.  . doxycycline (VIBRA-TABS) 100 MG tablet Take 1 tablet (100 mg total) by mouth daily. For acne.  . Fluticasone-Salmeterol (ADVAIR DISKUS) 250-50 MCG/DOSE AEPB Inhale 1 puff into the lungs 2 (two) times daily. Frequency:BID   Dosage:0.0     Instructions:  Note:Dose: 250-50MCG  . glimepiride (AMARYL) 1 MG tablet Take 2 tablets (2 mg total) by mouth daily with supper.  . Glucose Blood (ACCU-CHEK AVIVA PLUS VI) by In Vitro route. Pt needs accu check Aviva plus lancets, test strips and meter to check blood sugars once daily  . glucose blood (ACCU-CHEK AVIVA PLUS) test strip 1 each by Other route as needed for other. Use as instructed  . ipratropium (ATROVENT) 0.02 % nebulizer solution Use every 6 hours   for Shortness of breath and wheezing and as needed.  Prescription is for 90 days  . isosorbide mononitrate (IMDUR) 60 MG 24 hr tablet Take 60 mg by mouth daily.   . loratadine (CLARITIN) 10 MG tablet Take 10 mg by mouth daily as needed.   . meloxicam (MOBIC) 7.5 MG tablet Take 1 tab twice a day  . metFORMIN (GLUCOPHAGE) 500 MG tablet Take 1 tablet (500 mg total) by mouth 2 (two) times daily with a meal.  . metroNIDAZOLE (FLAGYL) 500 MG tablet Take 1 tablet (500 mg total) by mouth 2 (two) times daily.  . nitroGLYCERIN (NITROSTAT) 0.4 MG SL tablet Place 0.4 mg under the tongue.  . OXYGEN Inhale  into the lungs. 2 liters @@ 24 hours daily.  . traMADol (ULTRAM) 50 MG tablet Take 1 tablet (50 mg total) by mouth 2 (two) times daily as needed. for pain  . traZODone (DESYREL) 100 MG tablet Take 1 tablet (100 mg total) by mouth at bedtime.  . umeclidinium bromide (INCRUSE ELLIPTA) 62.5 MCG/INH AEPB Inhale 1 puff into the lungs daily.   No facility-administered encounter medications on file as of 01/31/2018.     Functional Status:   In your present state of health, do you have any difficulty performing the following activities: 01/31/2018 04/19/2017  Hearing? N N  Vision? N Y  Difficulty concentrating or making decisions? N Y  Walking or climbing stairs? Y Y  Dressing or bathing? N N  Doing errands, shopping? Y Y  Preparing Food and eating ? N Y  Using the Toilet? N N  In the past six months, have you accidently leaked urine? N Y  Do you have problems with loss of bowel control? N Y  Managing your Medications? N N  Managing your Finances? Y Y  Comment daughter assists  daughter assist with filling out money orders to pay bills and put's them in the mail  Housekeeping or managing your Housekeeping? Y Y  Comment - daughter assist with housekeeping  Some recent data might be hidden    Fall/Depression Screening:    Fall Risk  01/31/2018 01/30/2018 01/09/2018  Falls in the past year? Yes Yes Yes  Number falls in past yr: 1 1 1  Injury with Fall? No No -  Risk for fall due to : - - -  Follow up - - -   PHQ 2/9 Scores 01/31/2018 01/30/2018 01/09/2018 09/30/2017 09/19/2017 04/19/2017  PHQ - 2 Score 2 0 0 0 6 2  PHQ- 9 Score 4 - - - 19 5    Assessment:  Pleasant 66 year old male, lives alone, daughter close by assists as  Needed.   Pt referred  By [facility] pharmacist for Community CM services  for diabetic education.  Pt's history includes but  not limited to CAD, Obstructive sleep apnea, PVD, COPD, Hypertension, Hyperlipidemia, DM.       DM:  Review of pt's glucometer/recorded readings- today 234, 7  day range 234,  Ranges 181-248 in am, pm 178-339.  Booklet Living Well with Diabetes provided.     COPD:  O 2 at rest after deep breaths 96% on 2 liters [place].   Wheezing noted in all  Fields posterior upon expiration, slight congestion noted upper anterior lobes upon Expiration.  Per pt to pick up antibiotic/start taking.     Advanced Directives- none in place, paperwork provided.       Plan:  As discussed with pt, new RN CM Portia (present during home   visit) To provide Community CM services next month/ call in 2 weeks to schedule  Home visit.             Plan to inform Heather Boscia NP of [facility] involvement, send [place] letter   As well as send 01/31/18 initial home visit encounter.    [facility] CM Care Plan Problem One     Most Recent Value  Care Plan Problem One  Diabetes- self management   Role Documenting the Problem One  Care Management Coordinator  Care Plan for Problem One  Active  [facility] Long Term Goal   Pt's AIC would decrease one point in the next 90 days   [facility] Long Term Goal Start Date  01/31/18  Interventions for Problem One Long Term Goal  Provided pt with Living well with Diabetes, reviewed diiet, food choices/portions, reading food labels for sugar content.      Lawrence M.   Pierzchala RN CCM [facility] Care Management  [phone]     

## 2018-02-03 ENCOUNTER — Other Ambulatory Visit: Payer: Self-pay

## 2018-02-03 DIAGNOSIS — E1165 Type 2 diabetes mellitus with hyperglycemia: Secondary | ICD-10-CM

## 2018-02-03 MED ORDER — GLIMEPIRIDE 1 MG PO TABS: 2.0000 mg | ORAL_TABLET | Freq: Every day | ORAL | 1 refills | Status: DC

## 2018-02-05 ENCOUNTER — Telehealth: Payer: Self-pay | Admitting: Internal Medicine

## 2018-02-05 DIAGNOSIS — J42 Unspecified chronic bronchitis: Secondary | ICD-10-CM

## 2018-02-05 DIAGNOSIS — J440 Chronic obstructive pulmonary disease with acute lower respiratory infection: Secondary | ICD-10-CM

## 2018-02-05 MED ORDER — FLUTICASONE-SALMETEROL 250-50 MCG/DOSE IN AEPB: 1.0000 | INHALATION_SPRAY | Freq: Two times a day (BID) | RESPIRATORY_TRACT | 3 refills | Status: DC

## 2018-02-05 MED ORDER — UMECLIDINIUM BROMIDE 62.5 MCG/INH IN AEPB: 1.0000 | INHALATION_SPRAY | Freq: Every day | RESPIRATORY_TRACT | 3 refills | Status: DC

## 2018-02-05 MED ORDER — ALBUTEROL SULFATE HFA 108 (90 BASE) MCG/ACT IN AERS: 2.0000 | INHALATION_SPRAY | Freq: Four times a day (QID) | RESPIRATORY_TRACT | 3 refills | Status: DC | PRN

## 2018-02-05 NOTE — Telephone Encounter (Signed)
Faxed to [name] from [facility] prescription for advair, incruse, and ventolin for 90 days pt get free from insurance.

## 2018-02-07 ENCOUNTER — Other Ambulatory Visit: Payer: Self-pay | Admitting: Pharmacy Technician

## 2018-02-07 NOTE — Patient Outreach (Addendum)
[facility]) Care Management  02/07/2018  ALDOUS HOUSEL 09-20-1950 [id number]   [place] patient assistance to check status of patient application for inhalers. Spoke to [place] who stated that all required forms were in and that he meets the requirements however, the prescription were not ledge able due to showing up black.  Contacted Dr. Humphrey Rolls office and left message on nurse line requesting they call me back.   Will request that someone at office fax it directly into company. If call not returned, will call provider office again in 2-3 business days.  Addend:12:09pm  Incoming call from [facility] medical. Requested that someone from their office fax scripts for Incruse, Ventolin and Advair, as well as his drug and allergy list to [place]. Otila Kluver confirmed she would do so.  Will follow up with [place] on 85/92 for updated application status.  Maud Deed [place], [place] Management [phone]

## 2018-02-13 ENCOUNTER — Other Ambulatory Visit: Payer: Self-pay | Admitting: Pharmacy Technician

## 2018-02-13 NOTE — Patient Outreach (Signed)
[facility]) Care Management  02/13/2018  Lawrence Steele 1951/08/31 [id number]  Contacted [employer] to check status of patients application status for patients Ventolin, Advair and Incruse Ellipta. Rachel Bo confirmed that patient was approved on 02/13/18 until 09/16/18.   Successful outreach call to Lawrence Steele, HIPAA identifiers verified. Informed patient that he was approved for his 3 inhalers. Patient inquired about whether company will automatically send his refills when he needs them. I informed him that I would contact [place] to confirm.  Contacted [employer] again and representative stated the patient would have to request his refills every time.  Will follow up with patient in the next 7-10 business days to confirm medications have been received.  Maud Deed [place], [place] Management [phone]

## 2018-02-25 ENCOUNTER — Other Ambulatory Visit: Payer: Self-pay | Admitting: Pharmacy Technician

## 2018-02-25 NOTE — Patient Outreach (Signed)
[facility]) Care Management  02/25/2018  Lawrence Steele 29-Jan-1951 [id number]   Successful outreach call to patient, HIPAA identifiers verified. Patient states that he received his Ventolin, Advair, and Incruse this past Saturday. Reviewed with patient on how to obtain refills.  Will route to [place] for case closure.  Maud Deed [place], [place] Management [phone]

## 2018-02-26 ENCOUNTER — Other Ambulatory Visit: Payer: Self-pay

## 2018-02-26 ENCOUNTER — Other Ambulatory Visit: Payer: Self-pay | Admitting: Pharmacist

## 2018-02-26 NOTE — Patient Outreach (Signed)
[place] [facility]) Care Management  02/26/2018  Lawrence Steele 04-07-1951 [id number]   Receive InBasket message from [place] letting me know that [name] has received his Ventolin, Advair, and Incruse through the Patient Assistance Program. Call to follow up with [name] to see if he has any further pharmacy needs. HIPAA identifiers verified and verbal consent received.  Review with patient again the cost savings of using [employer], but patient states that he is not interested in using this pharmacy again due to billing and delivery issues that he had with this mail order pharmacy in the past.  [name] denies any further medication questions at this time. Let patient know that pharmacy will stop following him at this time, but ask that he call for future concerns. Patient confirms that he has my phone number.  PLAN  1) Will send an InBasket message to [place] to let her know of pharmacy episode closure.  2) Will close pharmacy episode.  Harlow Asa, PharmD, [place] Management [phone]

## 2018-02-26 NOTE — Patient Outreach (Addendum)
[facility]) Care Management  02/26/2018   KIAH KEAY 19-Dec-1950 [id number]   Care Coordination/Follow Up Phone Call  Successful telephone outreach encounter to Lawrence Steele, 67 year old male referred to chronic case management on 5/3 by Tommy Rainwater, [facility] pharmacist, for diabetes education. Spoke with patient. HIPAA identifiers verified. Patient is doing better with monitoring his blood sugars and recording. Reports receiving new glucometer and 100 test strips. States "I am checking my sugars twice a day; before breakfast and at night". Reported fasting am sugars are within ADA guidelines of 80-100. States yesterdays morning fasting blood sugar was 108, today's 131. Last evenings blood sugar reading before bed was 251. He reports removing all simple sugar containing snacks from his home and replacing them with sugarfree snacks. He is making better choices with what he consumes from meals on wheels (most meals according to the monthly calendar contained pasta, rice, mashed potatoes daily with yeast rolls or crackers) and has switched from buying canned vegetables to frozen vegetables related to the sodium content in the canned. He is cutting down on his portion sizes as well. Both Short Term Goals met on Care Plan.  Medication Adherence is now 100%. He received his pulmonary inhalers Saturday through the Patient Assistance Program. He verbalizes a much easier work of breathing and a decrease in his shortness of breath since restarting these medications.  Patient complains of chronic back pain during this encounter. He has been referred to the pain clinic in the past and had back injections which he says has not helped.  OTC medications and muscle relaxants help minimally.  Follow up Appointments: 03/11/18- Pain Clinic 04/22/18-PCP 06/09/18-Pulmonologist  Plan: RN CM will follow up with home visit within 2 weeks to do education reinforcement and review of "Living with Diabetes"  booklet. If no other needs identified for Chronic Case Management will transition to Health Coach with patients consent.   Wai Minotti E. Rollene Rotunda RN, BSN [facility] Care Management Coordinator [phone]

## 2018-03-04 ENCOUNTER — Other Ambulatory Visit: Payer: Self-pay

## 2018-03-04 MED ORDER — MELOXICAM 7.5 MG PO TABS: ORAL_TABLET | ORAL | 3 refills | Status: DC

## 2018-03-04 MED ORDER — TRAZODONE HCL 100 MG PO TABS: 100.0000 mg | ORAL_TABLET | Freq: Every day | ORAL | 3 refills | Status: DC

## 2018-03-06 ENCOUNTER — Other Ambulatory Visit: Payer: Self-pay

## 2018-03-06 MED ORDER — CYCLOBENZAPRINE HCL 10 MG PO TABS: 10.0000 mg | ORAL_TABLET | Freq: Two times a day (BID) | ORAL | 2 refills | Status: DC | PRN

## 2018-03-10 ENCOUNTER — Other Ambulatory Visit: Payer: Self-pay

## 2018-03-10 NOTE — Patient Outreach (Addendum)
[facility]) Care Management   03/10/2018  Lawrence Steele 02/16/1951 [id number]  Lawrence Steele is an 67 y.o. male  Subjective: "My health is fine and I am doing good if it wasn't for my back pain". I can't exercise at all like I used to. I used to walk around the block twice a day and I can't walk anywhere not".  Objective:   BP 136/74 (BP Location: Left Arm, Patient Position: Sitting, Cuff Size: Normal)   Pulse 82   Resp 20   Wt 257 lb (116.6 kg) Comment: last office visit 2 months ago  SpO2 95%   BMI 36.88 kg/m     Physical Exam  Constitutional: He is oriented to person, place, and time. He appears well-nourished.  Cardiovascular: Normal rate, normal heart sounds and intact distal pulses.  Irregular rhythm Mild pitting edema to lower legs and ankles.  Respiratory: Effort normal.  Scattered expiratory wheezes throughout R>L Course rhonchi throughout right lobe, left lower. Patient unable to clear with cough. No acute distress.  GI: Soft. Bowel sounds are normal.  Musculoskeletal: He exhibits tenderness.  Chronic lower middle back pain 7/10 that radiates down left leg.  Neurological: He is alert and oriented to person, place, and time.  Skin: Skin is warm and dry.  Scattered ecchymosis on lower forearms bilat  Psychiatric: He has a normal mood and affect. His behavior is normal. Judgment and thought content normal.    Encounter Medications:   Outpatient Encounter Medications as of 03/10/2018  Medication Sig Note  . ACCU-CHEK SOFTCLIX LANCETS lancets Use as instructed  Once a daily E11.65   . acetaminophen (TYLENOL) 500 MG tablet Take 500 mg by mouth every 6 (six) hours as needed.    Marland Kitchen albuterol (PROAIR HFA) 108 (90 Base) MCG/ACT inhaler Inhale 2 puffs into the lungs every 6 (six) hours as needed.   Marland Kitchen allopurinol (ZYLOPRIM) 100 MG tablet Take 1 tablet (100 mg total) by mouth 2 (two) times daily.   Marland Kitchen aspirin 81 MG tablet Take 81 mg by mouth daily.    Marland Kitchen  atenolol (TENORMIN) 25 MG tablet Take 1 tablet (25 mg total) by mouth daily.   Marland Kitchen atorvastatin (LIPITOR) 20 MG tablet Take 1 tablet (20 mg total) by mouth daily.   . Blood Glucose Monitoring Suppl (ACCU-CHEK AVIVA PLUS) w/Device KIT Use once daily to check blood sugar.   . cyclobenzaprine (FLEXERIL) 10 MG tablet Take 1 tablet (10 mg total) by mouth 2 (two) times daily as needed for muscle spasms.   . diclofenac sodium (VOLTAREN) 1 % GEL Apply topically 4 (four) times daily.   . Fluticasone-Salmeterol (ADVAIR DISKUS) 250-50 MCG/DOSE AEPB Inhale 1 puff into the lungs 2 (two) times daily. Frequency:BID   Dosage:0.0     Instructions:  Note:Dose: 250-50MCG   . glimepiride (AMARYL) 1 MG tablet Take 2 tablets (2 mg total) by mouth daily with supper.   . Glucose Blood (ACCU-CHEK AVIVA PLUS VI) by In Vitro route. Pt needs accu check Aviva plus lancets, test strips and meter to check blood sugars once daily   . glucose blood (ACCU-CHEK AVIVA PLUS) test strip 1 each by Other route as needed for other. Use as instructed   . ipratropium (ATROVENT) 0.02 % nebulizer solution Use every 6 hours for Shortness of breath and wheezing and as needed.  Prescription is for 90 days   . isosorbide mononitrate (IMDUR) 60 MG 24 hr tablet Take 60 mg by mouth daily.    Marland Kitchen  loratadine (CLARITIN) 10 MG tablet Take 10 mg by mouth daily as needed.    . meloxicam (MOBIC) 7.5 MG tablet Take 1 tab twice a day   . metFORMIN (GLUCOPHAGE) 500 MG tablet Take 1 tablet (500 mg total) by mouth 2 (two) times daily with a meal.   . OXYGEN Inhale into the lungs. 2 liters @@ 24 hours daily.   Marland Kitchen tiotropium (SPIRIVA) 18 MCG inhalation capsule Place 18 mcg into inhaler and inhale daily. Pt using in place of Incruse Ellipita   . traMADol (ULTRAM) 50 MG tablet Take 1 tablet (50 mg total) by mouth 2 (two) times daily as needed. for pain   . traZODone (DESYREL) 100 MG tablet Take 1 tablet (100 mg total) by mouth at bedtime.   Marland Kitchen umeclidinium bromide  (INCRUSE ELLIPTA) 62.5 MCG/INH AEPB Inhale 1 puff into the lungs daily.   Marland Kitchen doxycycline (VIBRA-TABS) 100 MG tablet Take 1 tablet (100 mg total) by mouth daily. For acne. (Patient not taking: Reported on 01/31/2018)   . nitroGLYCERIN (NITROSTAT) 0.4 MG SL tablet Place 0.4 mg under the tongue. 01/31/2018: Available if needed.   . [DISCONTINUED] azithromycin (ZITHROMAX) 250 MG tablet As directed (Patient not taking: Reported on 01/31/2018)   . [DISCONTINUED] ciprofloxacin (CIPRO) 500 MG tablet Take 1 tablet (500 mg total) by mouth 2 (two) times daily. (Patient not taking: Reported on 01/31/2018)   . [DISCONTINUED] metroNIDAZOLE (FLAGYL) 500 MG tablet Take 1 tablet (500 mg total) by mouth 2 (two) times daily. (Patient not taking: Reported on 01/31/2018)    No facility-administered encounter medications on file as of 03/10/2018.     Assessment:  Lawrence Steele, 66 year old male referred to chronic case management on 5/3 by Tommy Rainwater, [facility] pharmacist, for diabetes education. Patient states he is doing well managing his diabetes. He continues to make "better choices". He continues to avoid simple sugars and has transitioned to sugar free snacks. Food is provided daily by meals on wheels. It is concerning that patient only eats once or twice daily. Educated patient on importance of good nutrition and regular meals to keep blood sugar in a steady state. Patient not receptive to any education. States "if I loose this weight I will get off my diabetic medicines". RN CM applauded patients success with weight loss and attempted to educate patient on healthy eating habits but again patient was not receptive to any education. Patient is taking all medications as prescribed. Checking bloodsugars AM and PM. This mornings fasting CBG 128 and yesterday 117. Patient states he is eating fresh and frozen vegetables "now that it is summer". Attempted to discuss care plan goals that further compliment diabetic education such as  mild exercise, and calling PCP for opthalmology and podiatrist referral for diabetic exams and patient stated he would not go. "waist of time". "I told my doctor I was not going to go". Patients chronic back pain may be playing a role in his receptiveness to better his health at this time.  Patient complains of chronic back pain during this encounter. He has been referred to the pain clinic in the past and had back injections which he says has not helped. Has an appointment Wednesday 03/12/18 at 7am with the "pain doctor". Unable to validate appointment type in EMR.  RN CM encouraged patient to use alternative therapies such as ice, heat, repositioning, back brace, and distraction. Patient states he has tried all the above therapies and they did not help. Has 2 back braces but they  fit so snuggly he cannot breath with them on. OTC medications and muscle relaxants help minimally. Patient states he takes ibuprofen AND Mobic together and that help somewhat. Cautioned patient against taking two NSAIDS together. Patient verbalized understanding and stated he was unaware it would cause harm. Patient stated he would not take them together. RN CM instructed patient to take one or the other and not within the same day unless he consulted with his physician prior.  Continues to wear O2 at 2lpm. Continues to smoke 1/2-1 pack daily. Wheezing and rhonchi as noted in physical exam. Patient in no distress. Denies shortness of breath. Patient states "I always sound like this until I can cough it up". Encouraged patient to use pulmonary medications as prescribed. Uses nebulizer daily. Smoking cessation encouraged. Benefits discussed. Patient refused any further discussion or Quit Smart handout and stated "I am not ready to quit". Patient stated he DID NOT smoke with his oxygen on or in the same room as his oxygen concentrator. Patient encouraged not to smoke in the home.   Impression: Patient is doing well with minimal  management of his chronic diseases including DM2 and COPD however at this time his chronic back pain and quality of life related to the back pain overshadows any desires and abilities he may have to focus on his chronic conditions. He states he would love to walk to lower his A1C and for his heart however that is not an option related to his orthopedic issues. Most of his day is spent in the recliner. He continues to smoke related to anxiety caused by pain. RN CM encouraged patient to have indepth conversation with provider during this weeks "pain appointment".  Follow-up Appointments: 03/12/18 "Pain doctor" [facility] 04/22/18 PCP follow-up for diabetes 06/09/18 Pulmonology appointment with Dr. Humphrey Rolls  Plan: Follow-up telephonic encounter in 4 weeks   [facility] CM Care Plan Problem One     Most Recent Value  Care Plan Problem One  Diabetes- self management   Role Documenting the Problem One  Care Management [place] for Problem One  Active  [facility] Long Term Goal   Pt's AIC would decrease one point in the next 90 days   [facility]N Long Term Goal Start Date  01/31/18  [facility] CM Short Term Goal #1   Pt would obtain his own glucometer, continue to check sugars within the next 30 days  [Addendum- late entry for short term goal #1 ]  [facility] CM Short Term Goal #1 Start Date  01/31/18  [facility] CM Short Term Goal #1 Met Date  02/26/18  [facility] CM Short Term Goal #2   Pt would have better understanding of diabetic diet within the next 30 days  [Addendum: late entry for short term goal #2 for 5/17 h/v]  [facility]N CM Short Term Goal #2 Start Date  01/31/18  [facility] CM Short Term Goal #2 Met Date  02/26/18  [facility] CM Short Term Goal #3  patient will transition from loaner glucometer to his new glucometer over the next 30 days  THN CM Short Term Goal #3 Start Date  03/10/18  Interventions for Short Tern Goal #3  encouraged patient to use only one glucometer for accuracy of 7 day review    [facility] CM Care Plan Problem Two     Most Recent Value   Care Plan Problem Two  chronic back pain-self care  Role Documenting the Problem Two  Care Management [place] for Problem Two  Active  Interventions for Problem Two Long Term Goal   encouraged patient to perform exercises previously prescribed by PT for strengthing back muscles  THN Long Term Goal  patient will be able to perform ADLs with less pain over the next 31 days  THN Long Term Goal Start Date  03/10/18  [facility] CM Short Term Goal #1   patient will communicate effectively with MD at next appointment effects of current pain on QOL  THN CM Short Term Goal #1 Start Date  03/10/18  Interventions for Short Term Goal #2   RN CM gave emotional support to patient and encouraged him to have a conversation with pain MD at Wednesdays appointment      Sheilia Reznick E. Rollene Rotunda RN, BSN [facility] Care Management Coordinator [phone]

## 2018-04-03 ENCOUNTER — Telehealth: Payer: Self-pay

## 2018-04-03 NOTE — Telephone Encounter (Signed)
Due to his pain level and him not being able to sleep through the night I suggest it is in his best interest to go to the ER as soon as possible.

## 2018-04-03 NOTE — Telephone Encounter (Signed)
Very good. Thank you!

## 2018-04-03 NOTE — Telephone Encounter (Signed)
He needs to be seen. Either by [place] or by Er. Sounds as though he may be developing abnormal heart rhythm.

## 2018-04-04 NOTE — Telephone Encounter (Signed)
FOLLOWED UP WITH PT BECAUSE DID NOT SEE HE WAS ADMITTED TO ER. HE SAID HE IS STICKING IT OUT BECAUSE HE SAID A HOME HEALTH NURSE SAID IT COULD BE FROM THE PAIN AND ALSO BECAUSE HE DOES NOT HAVE A RIDE. I CLARIFIED THAT HE CAN COME HERE AS WELL IF HE IS ABLE TO GET A RIDE. WE ARE OPEN UNTIL 4 AND TO GIVE [place] A CALL BACK IF HE IS ABLE TO COME IN. IF NOT THEN TO DEFINITELY TRY AND GO TO THE ER AS SOON AS POSSIBLE.

## 2018-04-07 ENCOUNTER — Other Ambulatory Visit: Payer: Self-pay

## 2018-04-07 NOTE — Patient Outreach (Signed)
[facility]) Care Management  04/07/2018  TOMY KHIM Oct 17, 1950 [id number]   Unsuccessful telephone outreach to the above patient at given number for 30 day follow-up assessment. Patient has been reached at [phone] with previous calls. Now number is "not a working number". RN CM made outreach call to daughter, Janson Lamar, as indicated on [facility] CM consent. Left HIPAA compliant VM message requesting call back.  Plan: Will send "unable to contact" letter if no response by end of day. Attempt call back within 3-4 business days.  Babita Amaker E. Rollene Rotunda RN, BSN [facility] Care Management Coordinator [phone]

## 2018-04-07 NOTE — Patient Outreach (Signed)
[facility]) Care Management  04/07/2018  Lawrence Steele 12-29-1950 [id number]   Care Coordination: Unsuccessful Outreach letter sent in response to failure to contact and no return call by patient's daughter by end of business day today. Will attempt telephone outreach in 3-4 business days.   Betha Shadix E. Rollene Rotunda RN, BSN [facility] Care Management Coordinator [phone]

## 2018-04-08 ENCOUNTER — Other Ambulatory Visit: Payer: Self-pay

## 2018-04-08 ENCOUNTER — Telehealth: Payer: Self-pay | Admitting: Cardiovascular Disease

## 2018-04-08 NOTE — Patient Outreach (Signed)
[facility]) Care Management  04/08/2018  Lawrence Steele 09-22-1950 [id number]   Successful outreach attempt to reach patient after daughter left patient's new contact number on RN CM voicemail. Patient states he is currently working with PT and request RN CM call back today at 4pm.  Plan: Telephone assessment call at 4pm per patient's request.  Kiyomi Pallo E. Rollene Rotunda RN, BSN [facility] Care Management Coordinator [phone]

## 2018-04-08 NOTE — Telephone Encounter (Signed)
With heart rate 140 bpm Would recommend he come in to clinic with me Wednesday, July 24  add on at the end of the day 3:20 or 3:40 TG

## 2018-04-08 NOTE — Telephone Encounter (Signed)
Patient states that his daughter took it last week and it was elevated and he has been urged to call and get appointment. He also reports elevated heart rate running from 100's-150's. He denies any chest pain, shortness of breath, or any other concerns. Blood pressure readings and heart rates: BP 140/88 BP 140/92 HR 143 HR 140 BP 118/93 HR 119  Reviewed signs and symptoms to monitor for that would require immediate evaluation in the ED. He verbalized understanding and confirmed scheduled upcoming appointment information.

## 2018-04-08 NOTE — Patient Outreach (Signed)
[facility]) Care Management  04/08/2018  Lawrence Steele 1951/08/10 [id number]  Successful 30 day follow-up telephone assessment encounter with the above patient. Patient states he is doing "OK". He continues to struggle with severe back pain, 8/10 today. He participates in [facility] PT twice weekly however he states he is not seeing much relief with PT therapy. Has a "Neuro Study" Thursday that he hopes will identify the cause of his severe pain. Follow-up appointment with [facility] on August 7th to discuss next steps in pain relief therapy.  Per EMR documentation and patient, he has had in increases HR over the last several weeks. Patient reports his HR has been in the 140s as told to him by the physical therapist. Per EMR documentation, physical therapist contacted pulmonologist office with elevated HR. Patient advised to go to the ED or come to pulmonologist office for assessment related to concern patient may be developing an abnormal rhythm. Patient states he did not follow through with recommendations related to cost of ED and lack of transportation. Discussed with patient transportation options and potential causes of elevated HR including developing abnormal rhythm. RN CM recommended patient contact Cardiologist ASAP and schedule transportation with [employer]. Patient states he would call this pm for appointment. RN CM reviewed s/s of stroke including FAST and symptoms of MI and importance of utilizing EMS if these symptoms occur. Patient verbalized understanding.   Patient states he is doing better following a diabetic diet. His appetite has decreased and he feels it is partially related to his constant pain. He is checking his fasting sugars daily, today's reading was 126. He is not consistently checking his HS sugar. "3 nights ago it was 140". Patient states he continues to write his sugars down in his log and plans to take them to his PCP appointment on August 6. Patient  encouraged to consistently check and  record his HS CBG. He continues to use the loaner glucometer he received from his daughter instead of his newly prescribed/obtained glucometer related to the amount of strips he has. Patient encouraged to check the expiration dates of the strips.  Plan: Follow up with patient in 3 days to check status of Cardiology appointment and transportation.   Devine Dant E. Rollene Rotunda RN, BSN [facility] Care Management Coordinator [phone]

## 2018-04-08 NOTE — Telephone Encounter (Signed)
Patient calling  States that he has been having issues with blood pressure and his heart rate has been jumping up to 141 Scheduled to see Dr. Rockey Situ on 8/2 but would like to speak with nurse Please call to discuss

## 2018-04-09 ENCOUNTER — Encounter: Payer: Self-pay | Admitting: Cardiovascular Disease

## 2018-04-09 ENCOUNTER — Ambulatory Visit: Payer: Medicare HMO | Admitting: Cardiovascular Disease

## 2018-04-09 VITALS — BP 120/80 | HR 137 | Ht 70.0 in | Wt 254.8 lb

## 2018-04-09 DIAGNOSIS — I739 Peripheral vascular disease, unspecified: Secondary | ICD-10-CM

## 2018-04-09 DIAGNOSIS — I25111 Atherosclerotic heart disease of native coronary artery with angina pectoris with documented spasm: Secondary | ICD-10-CM

## 2018-04-09 DIAGNOSIS — I4892 Unspecified atrial flutter: Secondary | ICD-10-CM | POA: Insufficient documentation

## 2018-04-09 DIAGNOSIS — J432 Centrilobular emphysema: Secondary | ICD-10-CM

## 2018-04-09 DIAGNOSIS — I714 Abdominal aortic aneurysm, without rupture, unspecified: Secondary | ICD-10-CM

## 2018-04-09 DIAGNOSIS — I10 Essential (primary) hypertension: Secondary | ICD-10-CM | POA: Diagnosis not present

## 2018-04-09 DIAGNOSIS — R0602 Shortness of breath: Secondary | ICD-10-CM

## 2018-04-09 DIAGNOSIS — I483 Typical atrial flutter: Secondary | ICD-10-CM

## 2018-04-09 DIAGNOSIS — F17200 Nicotine dependence, unspecified, uncomplicated: Secondary | ICD-10-CM

## 2018-04-09 MED ORDER — ATENOLOL 25 MG PO TABS: 25.0000 mg | ORAL_TABLET | Freq: Two times a day (BID) | ORAL | 11 refills | Status: DC

## 2018-04-09 MED ORDER — DIGOXIN 250 MCG PO TABS: 0.2500 mg | ORAL_TABLET | Freq: Every day | ORAL | 3 refills | Status: DC

## 2018-04-09 MED ORDER — APIXABAN 5 MG PO TABS: 5.0000 mg | ORAL_TABLET | Freq: Two times a day (BID) | ORAL | 3 refills | Status: DC

## 2018-04-09 NOTE — H&P (View-Only) (Signed)
Cardiology Office Note  Date:  04/09/2018   ID:  Lawrence Steele, DOB 1951-01-13, MRN [id number]  PCP:  Ronnell Freshwater, NP   Chief Complaint  Patient presents with  . other    Pt. c/o shortness of breath, chest pain, rapid heart beats and blood pressure fluctuating. Meds reviewed by the pt. verbally. The patient has a physical therapist come to his home and two weeks ago before starting therapy, pt. had a heart rate of 145 at rest.    HPI:  Lawrence Steele is a 67 year old gentleman with hx of   COPD,  coronary artery disease, catheterization 2000  50% left circumflex disease diabetes type 2, hemoglobin A1c 9.2 ETOH, vodka long history of smoking, continues to smoke diastolic CHF,  hypertension,  hyperlipidemia,  Morbid obesity Followed at [facility] for a moderately large abdominal aortic aneurysm, dilated iliac arteries. S/p EVAR Ascending aorta <4 cm cough syncope, none in several years  chronic oxygen at night, not on exertion who presents for follow-up of his coronary artery disease.  We received a phone call concerning tachycardia rate up to 140 bpm fromvisiting nurses and physical therapist Recommended he come in the next day, today Reports increased fatigue shortness of breath irritability Some balance issues, walks with a cane  Believes he has had tachycardia for 2 weeks Denies any gross worsening of his leg edema No significant PND orthopnea 2 weeks ago may have got very stressed on the phone,  bounced around on the phone concerning some insurance issues Wonders if miss that had something to do with it  EKG personally reviewed by myself on todays visit Shows suspected atrial flutter with ventricular rate 137 bpm  Other past medical history reviewed  stress test February 2018 No significant ischemia, inferoapical fixed perfusion defect small size  ejection fraction 62% Cardiac catheterization was offered if symptoms got worse  previous falls, low back pain,  neuropathy Had MRI lumbar spine with mild degeneration of disks,  Presented to chest pain August 14, 2017  Hospital records reviewed with the patient in detail Notes indicating that he was drinking heavy vodka Diagnosed with alcohol intoxication level was 383  Previous cardiac catheterization from 2010 reviewed with him showing diffuse mild disease, 50% mid left circumflex, 20% disease in the other vessels   CT ABd 09/2015 at [facility]  completed AAA endovascular stent at [facility] reports -- Stable aortobiiliac endograft with decreased excluded aneurysm size. The previously identified type II endoleak is not well seen on today's study. -- Stable bilateral common iliac artery aneurysms. -- Stable anterior abdominal wall hernia containing nonobstructed small bowel, anterior bladder, and penile prosthesis reservoir.  He is seen by Dr. Humphrey Rolls , pulmonary, did 6 min walk, 94% Only oxygen at night, discussed with him in detail  Was on lipitor for years, just decided to stop this on his own, possibly having stomach issues with Lipitor   previously reported having problems on Crestor , myalgias Myalgias on simvastatin  long history of shortness of breath, on several inhalers  He has been seen at [facility] for a moderately large abdominal aortic aneurysm as well as dilated iliac arteries.   cardiac catheterization 01/18/2009.  Notes indicate he has proximal 20% RCA disease, 10% left main disease, 30 follow by 50% mid left circumflex disease, 20% OM1 disease, 20% OM 2 disease, 20% disease of a ramus branch, 20% mid LAD disease and 20% D1 disease. No intervention was performed at that time He does report a cardiac catheterization prior  to that. No further cardiac catheterization or stress testing since 2010  Previously reported chest pain over the past several years. with arm pain, possible numbness in his arm  in the hospital 04/01/2014 for chest pain, shortness of breath. Chest x-ray showed scarring  at the left base, lab work reviewed with him showing negative cardiac enzymes, normal renal function, elevated alcohol level 0.28, BNP 270, EKG with no significant ST or T-wave changes  Records below were researched and found on care anywhere, reviewed with the patient today Echocardiogram done at [facility] 07/28/2014 showing normal LV systolic function, dilated thoracic aorta 4.0 cm at the sinus of Valsalva, 3.6 cm above the sinotubular junction, LVH otherwise normal study  CT scan of the abdomen details a infrarenal abdominal aortic aneurysm arising at the level of the IMA measuring up to 4.8 x 4.3 cm, iliac artery aneurysm 2.6 on the right, 2.2 on the left  PMH:   has a past medical history of AAA (abdominal aortic aneurysm) ([place]), AAA (abdominal aortic aneurysm) ([place]), Anxiety, Asthma, CHF (congestive heart failure) ([place] COPD (chronic obstructive pulmonary disease) ([place]), Coronary artery disease, Diabetes mellitus without complication ([place]), ESRD (end stage renal disease) ([place]), Hyperlipidemia, Hypertension, Iliac artery aneurysm, bilateral ([place]), Obstructive sleep apnea-hypopnea syndrome, and Sleep apnea.  PSH:    Past Surgical History:  Procedure Laterality Date  . ABDOMINAL AORTIC ANEURYSM REPAIR  03/06/2015   [place]    . CARDIAC CATHETERIZATION  2010,03/16/2003,11/04/2002  . CORONARY ANGIOPLASTY  03/16/2003   stent to the RCA  & 2/18/2004stent mid circumflex  . ELBOW SURGERY    . HERNIA REPAIR    . PENILE PROSTHESIS IMPLANT  1995  . TONSILECTOMY, ADENOIDECTOMY, BILATERAL MYRINGOTOMY AND TUBES      Current Outpatient Medications  Medication Sig Dispense Refill  . ACCU-CHEK SOFTCLIX LANCETS lancets Use as instructed  Once a daily E11.65 100 each 1  . acetaminophen (TYLENOL) 500 MG tablet Take 500 mg by mouth every 6 (six) hours as needed.     Marland Kitchen albuterol (PROAIR HFA) 108 (90 Base) MCG/ACT inhaler Inhale 2 puffs into the lungs every 6 (six) hours as needed. 3  Inhaler 3  . allopurinol (ZYLOPRIM) 100 MG tablet Take 1 tablet (100 mg total) by mouth 2 (two) times daily. 180 tablet 1  . aspirin 81 MG tablet Take 81 mg by mouth daily.     Marland Kitchen atenolol (TENORMIN) 25 MG tablet Take 1 tablet (25 mg total) by mouth daily. 30 tablet 11  . atorvastatin (LIPITOR) 20 MG tablet Take 1 tablet (20 mg total) by mouth daily. 30 tablet 11  . Blood Glucose Monitoring Suppl (ACCU-CHEK AVIVA PLUS) w/Device KIT Use once daily to check blood sugar. 1 kit 3  . cyclobenzaprine (FLEXERIL) 10 MG tablet Take 1 tablet (10 mg total) by mouth 2 (two) times daily as needed for muscle spasms. 60 tablet 2  . diclofenac sodium (VOLTAREN) 1 % GEL Apply topically 4 (four) times daily.    Marland Kitchen doxycycline (VIBRA-TABS) 100 MG tablet Take 1 tablet (100 mg total) by mouth daily. For acne. 30 tablet 4  . Fluticasone-Salmeterol (ADVAIR DISKUS) 250-50 MCG/DOSE AEPB Inhale 1 puff into the lungs 2 (two) times daily. Frequency:BID   Dosage:0.0     Instructions:  Note:Dose: 250-50MCG 3 each 3  . glimepiride (AMARYL) 1 MG tablet Take 2 tablets (2 mg total) by mouth daily with supper. 180 tablet 1  . Glucose Blood (ACCU-CHEK AVIVA PLUS VI) by In Vitro  route. Pt needs accu check Aviva plus lancets, test strips and meter to check blood sugars once daily    . glucose blood (ACCU-CHEK AVIVA PLUS) test strip 1 each by Other route as needed for other. Use as instructed 100 each 3  . ipratropium (ATROVENT) 0.02 % nebulizer solution Use every 6 hours for Shortness of breath and wheezing and as needed.  Prescription is for 90 days 900 mL 1  . isosorbide mononitrate (IMDUR) 60 MG 24 hr tablet Take 60 mg by mouth daily.     Marland Kitchen loratadine (CLARITIN) 10 MG tablet Take 10 mg by mouth daily as needed.     . meloxicam (MOBIC) 7.5 MG tablet Take 1 tab twice a day 60 tablet 3  . metFORMIN (GLUCOPHAGE) 500 MG tablet Take 1 tablet (500 mg total) by mouth 2 (two) times daily with a meal. 180 tablet 1  . nitroGLYCERIN (NITROSTAT)  0.4 MG SL tablet Place 0.4 mg under the tongue.    . OXYGEN Inhale into the lungs. 2 liters @@ 24 hours daily.    Marland Kitchen tiotropium (SPIRIVA) 18 MCG inhalation capsule Place 18 mcg into inhaler and inhale daily. Pt using in place of Incruse [place]ip[place]    . traMADol (ULTRAM) 50 MG tablet Take 1 tablet (50 mg total) by mouth 2 (two) times daily as needed. for pain 60 tablet 4  . traZODone (DESYREL) 100 MG tablet Take 1 tablet (100 mg total) by mouth at bedtime. 30 tablet 3  . umeclidinium bromide (INCRUSE ELLIPTA) 62.5 MCG/INH AEPB Inhale 1 puff into the lungs daily. 3 each 3   No current facility-administered medications for this visit.      Allergies:   Codeine   Social History:  The patient  reports that he has been smoking cigarettes.  He has a 11.25 pack-year smoking history. He has quit using smokeless tobacco. His smokeless tobacco use included chew. He reports that he drank alcohol. He reports that he does not use drugs.   Family History:   family history includes Depression in his mother; Heart attack (age of onset: 33) in his father; Heart disease in his father; Hyperlipidemia in his father and mother; Hypertension in his father and mother.    Review of Systems: Review of Systems  Constitutional: Negative.   Respiratory: Positive for shortness of breath.   Cardiovascular: Positive for palpitations.  Gastrointestinal: Negative.   Musculoskeletal: Negative.   Neurological: Negative.   Psychiatric/Behavioral: Negative.   All other systems reviewed and are negative.    PHYSICAL EXAM: VS:  BP 120/80 (BP Location: Left Arm, Patient Position: Sitting, Cuff Size: Normal)   Pulse (!) 137   Ht 5' 10"  (1.778 m)   Wt 254 lb 12 oz (115.6 kg)   SpO2 98% Comment: 1 liter of oxygen  BMI 36.55 kg/m  , BMI Body mass index is 36.55 kg/m. GEN: Well nourished, well developed, in no acute distress , obese, percent with a cane, gait instability HEENT: normal  Neck: no JVD, carotid bruits, or  masses Cardiac: tachycardic, regularr, no rubs, or gallops,no edema  Respiratory:  moderately decreased breath sounds throughout, scant wheezing , normal work of breathing GI: soft, nontender, nondistended, + BS MS: no deformity or atrophy  Skin: warm and dry, no rash Neuro:  Strength and sensation are intact Psych: euthymic mood, full affect   Recent Labs: 08/14/2017: BUN 15; Creatinine, Ser 0.68; Hemoglobin 14.8; Platelets 103; Potassium 3.8; Sodium 132    Lipid Panel Lab Results  Component  Value Date   CHOL 199 10/25/2016   HDL 56 10/25/2016   LDLCALC 120 (H) 10/25/2016   TRIG 114 10/25/2016      Wt Readings from Last 3 Encounters:  04/09/18 254 lb 12 oz (115.6 kg)  03/10/18 257 lb (116.6 kg)  01/31/18 257 lb 4.8 oz (116.7 kg)       ASSESSMENT AND PLAN:  Coronary artery disease involving native coronary artery of native heart with angina pectoris with documented spasm (HCC) - Negative stress test 1 year ago February 2018 Currently with no symptoms of angina. No further workup at this time. Continue current medication regimen.  Atrial flutter Narrow complex tachycardia concerning for flutter Medications Limited by low blood pressure He is relatively asymptomatic Recommended he increase atenolol up to 25 twice a day Suggested he start digoxin 0.25 mg daily Recommended he call us in several days' time with heart rates Long discussion concerning need for cardioversion If we can adequately rate control, would wait 3 weeks before cardioversion If he becomes more symptomatic may need transesophageal echo with cardioversion Started on eliquis 5 twice a day on today's visit samples provided and coupon for free month  Essential hypertension - Will increase atenolol as above  SOB (shortness of breath) - Severe underlying COPD, coronary artery disease, obesity, deconditioning Recommended regular exercise program He is completing physical therapy  Chest tightness -   Denies any significant symptoms even with heart rate 130 bpm  Stable angina ([place]) Recommended he continue with his Lipitor We will hold his aspirin, start eliquis  Centrilobular emphysema ([place]) Followed by pulmonary, on 3 inhalers Reports symptoms are stable No recent respiratory infections  Morbid obesity (HCC) Chronic back pain, joint pain  Difficulty losing weight, unable to exercise Recommended low carbohydrate diet  Smoker Continues to smoke We have encouraged him to continue to work on weaning his cigarettes and smoking cessation. He will continue to work on this and does not want any assistance with chantix.  Again discussed with him   Total encounter time more than 45 minutes  Greater than 50% was spent in counseling and coordination of care with the patient  Disposition:   F/U  3 weeks He will calls with heart rates in several days' time   No orders of the defined types were placed in this encounter.    Signed, Esmond Plants, M.D., Ph.D. 04/09/2018  [place], [place]

## 2018-04-09 NOTE — Progress Notes (Signed)
Cardiology Office Note  Date:  04/09/2018   ID:  Lawrence Steele, DOB [birth year], MRN [id number]  PCP:  Ronnell Freshwater, NP   Chief Complaint  Patient presents with  . other    Pt. c/o shortness of breath, chest pain, rapid heart beats and blood pressure fluctuating. Meds reviewed by the pt. verbally. The patient has a physical therapist come to his home and two weeks ago before starting therapy, pt. had a heart rate of 145 at rest.    HPI:  Lawrence Steele is a 67 year old gentleman with hx of   COPD,  coronary artery disease, catheterization 2000  50% left circumflex disease diabetes type 2, hemoglobin A1c 9.2 ETOH, vodka long history of smoking, continues to smoke diastolic CHF,  hypertension,  hyperlipidemia,  Morbid obesity Followed at [facility] for a moderately large abdominal aortic aneurysm, dilated iliac arteries. S/p EVAR Ascending aorta <4 cm cough syncope, none in several years  chronic oxygen at night, not on exertion who presents for follow-up of his coronary artery disease.  We received a phone call concerning tachycardia rate up to 140 bpm fromvisiting nurses and physical therapist Recommended he come in the next day, today Reports increased fatigue shortness of breath irritability Some balance issues, walks with a cane  Believes he has had tachycardia for 2 weeks Denies any gross worsening of his leg edema No significant PND orthopnea 2 weeks ago may have got very stressed on the phone,  bounced around on the phone concerning some insurance issues Wonders if miss that had something to do with it  EKG personally reviewed by myself on todays visit Shows suspected atrial flutter with ventricular rate 137 bpm  Other past medical history reviewed  stress test February 2018 No significant ischemia, inferoapical fixed perfusion defect small size  ejection fraction 62% Cardiac catheterization was offered if symptoms got worse  previous falls, low back pain,  neuropathy Had MRI lumbar spine with mild degeneration of disks,  Presented to chest pain August 14, 2017  Hospital records reviewed with the patient in detail Notes indicating that he was drinking heavy vodka Diagnosed with alcohol intoxication level was 383  Previous cardiac catheterization from 2010 reviewed with him showing diffuse mild disease, 50% mid left circumflex, 20% disease in the other vessels   CT ABd 09/2015 at [facility]  completed AAA endovascular stent at [facility] reports -- Stable aortobiiliac endograft with decreased excluded aneurysm size. The previously identified type II endoleak is not well seen on today's study. -- Stable bilateral common iliac artery aneurysms. -- Stable anterior abdominal wall hernia containing nonobstructed small bowel, anterior bladder, and penile prosthesis reservoir.  He is seen by Dr. Humphrey Rolls , pulmonary, did 6 min walk, 94% Only oxygen at night, discussed with him in detail  Was on lipitor for years, just decided to stop this on his own, possibly having stomach issues with Lipitor   previously reported having problems on Crestor , myalgias Myalgias on simvastatin  long history of shortness of breath, on several inhalers  He has been seen at [facility] for a moderately large abdominal aortic aneurysm as well as dilated iliac arteries.   cardiac catheterization 01/18/2009.  Notes indicate he has proximal 20% RCA disease, 10% left main disease, 30 follow by 50% mid left circumflex disease, 20% OM1 disease, 20% OM 2 disease, 20% disease of a ramus branch, 20% mid LAD disease and 20% D1 disease. No intervention was performed at that time He does report a cardiac catheterization prior  to that. No further cardiac catheterization or stress testing since 2010  Previously reported chest pain over the past several years. with arm pain, possible numbness in his arm  in the hospital 04/01/2014 for chest pain, shortness of breath. Chest x-ray showed scarring  at the left base, lab work reviewed with him showing negative cardiac enzymes, normal renal function, elevated alcohol level 0.28, BNP 270, EKG with no significant ST or T-wave changes  Records below were researched and found on care anywhere, reviewed with the patient today Echocardiogram done at [facility] 07/28/2014 showing normal LV systolic function, dilated thoracic aorta 4.0 cm at the sinus of Valsalva, 3.6 cm above the sinotubular junction, LVH otherwise normal study  CT scan of the abdomen details a infrarenal abdominal aortic aneurysm arising at the level of the IMA measuring up to 4.8 x 4.3 cm, iliac artery aneurysm 2.6 on the right, 2.2 on the left  PMH:   has a past medical history of AAA (abdominal aortic aneurysm) ([place]), AAA (abdominal aortic aneurysm) ([place]), Anxiety, Asthma, CHF (congestive heart failure) ([place]), COPD (chronic obstructive pulmonary disease) ([place]), Coronary artery disease, Diabetes mellitus without complication ([place]), ESRD (end stage renal disease) ([place]), Hyperlipidemia, Hypertension, Iliac artery aneurysm, bilateral ([place]), Obstructive sleep apnea-hypopnea syndrome, and Sleep apnea.  PSH:    Past Surgical History:  Procedure Laterality Date  . ABDOMINAL AORTIC ANEURYSM REPAIR  03/06/2015   [place]    . CARDIAC CATHETERIZATION  2010,03/16/2003,11/04/2002  . CORONARY ANGIOPLASTY  03/16/2003   stent to the RCA  & 2/18/2004stent mid circumflex  . ELBOW SURGERY    . HERNIA REPAIR    . PENILE PROSTHESIS IMPLANT  1995  . TONSILECTOMY, ADENOIDECTOMY, BILATERAL MYRINGOTOMY AND TUBES      Current Outpatient Medications  Medication Sig Dispense Refill  . ACCU-CHEK SOFTCLIX LANCETS lancets Use as instructed  Once a daily E11.65 100 each 1  . acetaminophen (TYLENOL) 500 MG tablet Take 500 mg by mouth every 6 (six) hours as needed.     Marland Kitchen albuterol (PROAIR HFA) 108 (90 Base) MCG/ACT inhaler Inhale 2 puffs into the lungs every 6 (six) hours as needed. 3  Inhaler 3  . allopurinol (ZYLOPRIM) 100 MG tablet Take 1 tablet (100 mg total) by mouth 2 (two) times daily. 180 tablet 1  . aspirin 81 MG tablet Take 81 mg by mouth daily.     Marland Kitchen atenolol (TENORMIN) 25 MG tablet Take 1 tablet (25 mg total) by mouth daily. 30 tablet 11  . atorvastatin (LIPITOR) 20 MG tablet Take 1 tablet (20 mg total) by mouth daily. 30 tablet 11  . Blood Glucose Monitoring Suppl (ACCU-CHEK AVIVA PLUS) w/Device KIT Use once daily to check blood sugar. 1 kit 3  . cyclobenzaprine (FLEXERIL) 10 MG tablet Take 1 tablet (10 mg total) by mouth 2 (two) times daily as needed for muscle spasms. 60 tablet 2  . diclofenac sodium (VOLTAREN) 1 % GEL Apply topically 4 (four) times daily.    Marland Kitchen doxycycline (VIBRA-TABS) 100 MG tablet Take 1 tablet (100 mg total) by mouth daily. For acne. 30 tablet 4  . Fluticasone-Salmeterol (ADVAIR DISKUS) 250-50 MCG/DOSE AEPB Inhale 1 puff into the lungs 2 (two) times daily. Frequency:BID   Dosage:0.0     Instructions:  Note:Dose: 250-50MCG 3 each 3  . glimepiride (AMARYL) 1 MG tablet Take 2 tablets (2 mg total) by mouth daily with supper. 180 tablet 1  . Glucose Blood (ACCU-CHEK AVIVA PLUS VI) by In Vitro  route. Pt needs accu check Aviva plus lancets, test strips and meter to check blood sugars once daily    . glucose blood (ACCU-CHEK AVIVA PLUS) test strip 1 each by Other route as needed for other. Use as instructed 100 each 3  . ipratropium (ATROVENT) 0.02 % nebulizer solution Use every 6 hours for Shortness of breath and wheezing and as needed.  Prescription is for 90 days 900 mL 1  . isosorbide mononitrate (IMDUR) 60 MG 24 hr tablet Take 60 mg by mouth daily.     Marland Kitchen loratadine (CLARITIN) 10 MG tablet Take 10 mg by mouth daily as needed.     . meloxicam (MOBIC) 7.5 MG tablet Take 1 tab twice a day 60 tablet 3  . metFORMIN (GLUCOPHAGE) 500 MG tablet Take 1 tablet (500 mg total) by mouth 2 (two) times daily with a meal. 180 tablet 1  . nitroGLYCERIN (NITROSTAT)  0.4 MG SL tablet Place 0.4 mg under the tongue.    . OXYGEN Inhale into the lungs. 2 liters @@ 24 hours daily.    Marland Kitchen tiotropium (SPIRIVA) 18 MCG inhalation capsule Place 18 mcg into inhaler and inhale daily. Pt using in place of Incruse [place]ip[place]    . traMADol (ULTRAM) 50 MG tablet Take 1 tablet (50 mg total) by mouth 2 (two) times daily as needed. for pain 60 tablet 4  . traZODone (DESYREL) 100 MG tablet Take 1 tablet (100 mg total) by mouth at bedtime. 30 tablet 3  . umeclidinium bromide (INCRUSE ELLIPTA) 62.5 MCG/INH AEPB Inhale 1 puff into the lungs daily. 3 each 3   No current facility-administered medications for this visit.      Allergies:   Codeine   Social History:  The patient  reports that he has been smoking cigarettes.  He has a 11.25 pack-year smoking history. He has quit using smokeless tobacco. His smokeless tobacco use included chew. He reports that he drank alcohol. He reports that he does not use drugs.   Family History:   family history includes Depression in his mother; Heart attack (age of onset: 51) in his father; Heart disease in his father; Hyperlipidemia in his father and mother; Hypertension in his father and mother.    Review of Systems: Review of Systems  Constitutional: Negative.   Respiratory: Positive for shortness of breath.   Cardiovascular: Positive for palpitations.  Gastrointestinal: Negative.   Musculoskeletal: Negative.   Neurological: Negative.   Psychiatric/Behavioral: Negative.   All other systems reviewed and are negative.    PHYSICAL EXAM: VS:  BP 120/80 (BP Location: Left Arm, Patient Position: Sitting, Cuff Size: Normal)   Pulse (!) 137   Ht 5' 10"  (1.778 m)   Wt 254 lb 12 oz (115.6 kg)   SpO2 98% Comment: 1 liter of oxygen  BMI 36.55 kg/m  , BMI Body mass index is 36.55 kg/m. GEN: Well nourished, well developed, in no acute distress , obese, percent with a cane, gait instability HEENT: normal  Neck: no JVD, carotid bruits, or  masses Cardiac: tachycardic, regularr, no rubs, or gallops,no edema  Respiratory:  moderately decreased breath sounds throughout, scant wheezing , normal work of breathing GI: soft, nontender, nondistended, + BS MS: no deformity or atrophy  Skin: warm and dry, no rash Neuro:  Strength and sensation are intact Psych: euthymic mood, full affect   Recent Labs: 08/14/2017: BUN 15; Creatinine, Ser 0.68; Hemoglobin 14.8; Platelets 103; Potassium 3.8; Sodium 132    Lipid Panel Lab Results  Component  Value Date   CHOL 199 10/25/2016   HDL 56 10/25/2016   LDLCALC 120 (H) 10/25/2016   TRIG 114 10/25/2016      Wt Readings from Last 3 Encounters:  04/09/18 254 lb 12 oz (115.6 kg)  03/10/18 257 lb (116.6 kg)  01/31/18 257 lb 4.8 oz (116.7 kg)       ASSESSMENT AND PLAN:  Coronary artery disease involving native coronary artery of native heart with angina pectoris with documented spasm (HCC) - Negative stress test 1 year ago February 2018 Currently with no symptoms of angina. No further workup at this time. Continue current medication regimen.  Atrial flutter Narrow complex tachycardia concerning for flutter Medications Limited by low blood pressure He is relatively asymptomatic Recommended he increase atenolol up to 25 twice a day Suggested he start digoxin 0.25 mg daily Recommended he call us in several days' time with heart rates Long discussion concerning need for cardioversion If we can adequately rate control, would wait 3 weeks before cardioversion If he becomes more symptomatic may need transesophageal echo with cardioversion Started on eliquis 5 twice a day on today's visit samples provided and coupon for free month  Essential hypertension - Will increase atenolol as above  SOB (shortness of breath) - Severe underlying COPD, coronary artery disease, obesity, deconditioning Recommended regular exercise program He is completing physical therapy  Chest tightness -   Denies any significant symptoms even with heart rate 130 bpm  Stable angina ([place]) Recommended he continue with his Lipitor We will hold his aspirin, start eliquis  Centrilobular emphysema ([place]) Followed by pulmonary, on 3 inhalers Reports symptoms are stable No recent respiratory infections  Morbid obesity (HCC) Chronic back pain, joint pain  Difficulty losing weight, unable to exercise Recommended low carbohydrate diet  Smoker Continues to smoke We have encouraged him to continue to work on weaning his cigarettes and smoking cessation. He will continue to work on this and does not want any assistance with chantix.  Again discussed with him   Total encounter time more than 45 minutes  Greater than 50% was spent in counseling and coordination of care with the patient  Disposition:   F/U  3 weeks He will calls with heart rates in several days' time   No orders of the defined types were placed in this encounter.    Signed, Esmond Plants, M.D., Ph.D. 04/09/2018  [place], [place]

## 2018-04-09 NOTE — Telephone Encounter (Signed)
Patient scheduled to come in today and see provider.

## 2018-04-09 NOTE — Patient Instructions (Addendum)
Please have PT  (or you) call on Friday with some heart rate numbers    Medication Instructions:   Hold the aspirin Start Eliquis 5 mg twice a day (blood thinner to prevent stroke)  Please increase the atenolol up to twice a day  Start digoxin 2 pills the first day  Then one pill a day  Medication Samples have been provided to the patient.  Drug name: Eliquis  Strength: 5 mg Qty: 4 boxes LOT: [id number] Exp.Date: 6/21  Labwork:  No new labs needed  Testing/Procedures:  No further testing at this time   Follow-Up: It was a pleasure seeing you in the office today. Please call us if you have new issues that need to be addressed before your next appt.  [phone]  Your physician wants you to follow-up in: 3 to 4 weeks   If you need a refill on your cardiac medications before your next appointment, please call your pharmacy.  For educational health videos Log in to : [email or web address] Or : [email or web address], password : [email or web address]

## 2018-04-10 ENCOUNTER — Telehealth: Payer: Self-pay

## 2018-04-10 ENCOUNTER — Other Ambulatory Visit: Payer: Self-pay | Admitting: Nurse Practitioner

## 2018-04-10 DIAGNOSIS — F411 Generalized anxiety disorder: Secondary | ICD-10-CM

## 2018-04-10 MED ORDER — BUSPIRONE HCL 10 MG PO TABS: ORAL_TABLET | ORAL | 1 refills | Status: DC

## 2018-04-10 NOTE — Addendum Note (Signed)
Addended by: Anselm Pancoast on: 04/10/2018 01:04 PM   Modules accepted: Orders

## 2018-04-10 NOTE — Telephone Encounter (Signed)
Patient having a lot of issue with anxiety due to new issues with heart. Added buspirone 10mg . He can take 1/2 to 1 tablet twice daily as needed for anxiety. Sent to [place]. He needs to keep follow up 04/24/2018.

## 2018-04-10 NOTE — Telephone Encounter (Signed)
Pt advised that we send med for anxiety and keep follow up appt

## 2018-04-10 NOTE — Progress Notes (Signed)
Patient having a lot of issue with anxiety due to new issues with heart. Added buspirone 10mg . He can take 1/2 to 1 tablet twice daily as needed for anxiety. Sent to [place]. He needs to keep follow up 04/24/2018.

## 2018-04-11 ENCOUNTER — Emergency Department: Payer: Medicare HMO

## 2018-04-11 ENCOUNTER — Other Ambulatory Visit: Payer: Self-pay | Admitting: Nurse Practitioner

## 2018-04-11 ENCOUNTER — Telehealth: Payer: Self-pay | Admitting: Cardiovascular Disease

## 2018-04-11 ENCOUNTER — Encounter: Payer: Self-pay | Admitting: Radiology

## 2018-04-11 ENCOUNTER — Other Ambulatory Visit: Payer: Self-pay

## 2018-04-11 ENCOUNTER — Emergency Department
Admission: EM | Admit: 2018-04-11 | Discharge: 2018-04-11 | Disposition: A | Discharge status: Home / Self Care | Payer: Medicare HMO | Attending: Emergency Medicine | Admitting: Emergency Medicine

## 2018-04-11 DIAGNOSIS — Z7984 Long term (current) use of oral hypoglycemic drugs: Secondary | ICD-10-CM | POA: Insufficient documentation

## 2018-04-11 DIAGNOSIS — J449 Chronic obstructive pulmonary disease, unspecified: Secondary | ICD-10-CM | POA: Diagnosis not present

## 2018-04-11 DIAGNOSIS — Z7901 Long term (current) use of anticoagulants: Secondary | ICD-10-CM | POA: Insufficient documentation

## 2018-04-11 DIAGNOSIS — E1122 Type 2 diabetes mellitus with diabetic chronic kidney disease: Secondary | ICD-10-CM | POA: Diagnosis not present

## 2018-04-11 DIAGNOSIS — Z79899 Other long term (current) drug therapy: Secondary | ICD-10-CM | POA: Insufficient documentation

## 2018-04-11 DIAGNOSIS — I509 Heart failure, unspecified: Secondary | ICD-10-CM | POA: Insufficient documentation

## 2018-04-11 DIAGNOSIS — I4892 Unspecified atrial flutter: Secondary | ICD-10-CM | POA: Diagnosis not present

## 2018-04-11 DIAGNOSIS — I132 Hypertensive heart and chronic kidney disease with heart failure and with stage 5 chronic kidney disease, or end stage renal disease: Secondary | ICD-10-CM | POA: Diagnosis not present

## 2018-04-11 DIAGNOSIS — F17210 Nicotine dependence, cigarettes, uncomplicated: Secondary | ICD-10-CM | POA: Insufficient documentation

## 2018-04-11 DIAGNOSIS — R079 Chest pain, unspecified: Secondary | ICD-10-CM | POA: Diagnosis present

## 2018-04-11 DIAGNOSIS — N186 End stage renal disease: Secondary | ICD-10-CM | POA: Insufficient documentation

## 2018-04-11 DIAGNOSIS — I2510 Atherosclerotic heart disease of native coronary artery without angina pectoris: Secondary | ICD-10-CM | POA: Diagnosis not present

## 2018-04-11 LAB — HEPATIC FUNCTION PANEL
ALK PHOS: 57 U/L (ref 38–126)
ALT: 15 U/L (ref 0–44)
AST: 27 U/L (ref 15–41)
Albumin: 3.4 g/dL — ABNORMAL LOW (ref 3.5–5.0)
Total Bilirubin: 0.4 mg/dL (ref 0.3–1.2)
Total Protein: 7.4 g/dL (ref 6.5–8.1)

## 2018-04-11 LAB — BASIC METABOLIC PANEL
ANION GAP: 10 (ref 5–15)
BUN: 12 mg/dL (ref 8–23)
CALCIUM: 8.7 mg/dL — AB (ref 8.9–10.3)
CO2: 25 mmol/L (ref 22–32)
Chloride: 104 mmol/L (ref 98–111)
Creatinine, Ser: 0.78 mg/dL (ref 0.61–1.24)
GFR calc Af Amer: >60 mL/min (ref >60)
GLUCOSE: 171 mg/dL — AB (ref 70–99)
Potassium: 3.7 mmol/L (ref 3.5–5.1)
Sodium: 139 mmol/L (ref 135–145)

## 2018-04-11 LAB — DIGOXIN LEVEL: Digoxin Level: 0.5 ng/mL — ABNORMAL LOW (ref 0.8–2.0)

## 2018-04-11 LAB — CBC
HCT: 41.6 % (ref 40.0–52.0)
Hemoglobin: 14.8 g/dL (ref 13.0–18.0)
MCH: 36.5 pg — ABNORMAL HIGH (ref 26.0–34.0)
MCHC: 35.6 g/dL (ref 32.0–36.0)
MCV: 102.5 fL — ABNORMAL HIGH (ref 80.0–100.0)
Platelets: 145 10*3/uL — ABNORMAL LOW (ref 150–440)
RBC: 4.06 MIL/uL — ABNORMAL LOW (ref 4.40–5.90)
RDW: 15.5 % — AB (ref 11.5–14.5)
WBC: 9.7 10*3/uL (ref 3.8–10.6)

## 2018-04-11 LAB — PROTIME-INR
INR: 1.03
PROTHROMBIN TIME: 13.4 s (ref 11.4–15.2)

## 2018-04-11 LAB — TROPONIN I

## 2018-04-11 LAB — APTT: aPTT: 32 seconds (ref 24–36)

## 2018-04-11 MED ORDER — CYCLOBENZAPRINE HCL 10 MG PO TABS: 10.0000 mg | ORAL_TABLET | Freq: Two times a day (BID) | ORAL | 2 refills | Status: DC | PRN

## 2018-04-11 MED ORDER — SODIUM CHLORIDE 0.9 % IV BOLUS
1000.0000 mL | Freq: Once | INTRAVENOUS | Status: AC
  Administered 2018-04-11: 1000 mL via INTRAVENOUS

## 2018-04-11 MED ORDER — MAGNESIUM SULFATE 4 GM/100ML IV SOLN
4.0000 g | Freq: Once | INTRAVENOUS | Status: AC
  Administered 2018-04-11: 4 g via INTRAVENOUS
  Filled 2018-04-11: qty 100

## 2018-04-11 MED ORDER — IOPAMIDOL (ISOVUE-370) INJECTION 76%
75.0000 mL | Freq: Once | INTRAVENOUS | Status: AC | PRN
  Administered 2018-04-11: 75 mL via INTRAVENOUS

## 2018-04-11 MED ORDER — ADENOSINE 12 MG/4ML IV SOLN
12.0000 mg | Freq: Once | INTRAVENOUS | Status: AC
  Administered 2018-04-11: 12 mg via INTRAVENOUS
  Filled 2018-04-11: qty 4

## 2018-04-11 MED ORDER — DILTIAZEM HCL 60 MG PO TABS
60.0000 mg | ORAL_TABLET | Freq: Once | ORAL | Status: AC
  Administered 2018-04-11: 60 mg via ORAL
  Filled 2018-04-11: qty 1

## 2018-04-11 NOTE — Discharge Instructions (Signed)
Please follow-up with your cardiologist this coming Monday for reevaluation and medication management.  Please know that you are more than welcome to return to our emergency department in any point if you change your mind and would like to continue your care.  It was a pleasure to take care of you today, and thank you for coming to our emergency department.  If you have any questions or concerns before leaving please ask the nurse to grab me and I'm more than happy to go through your aftercare instructions again.  If you were prescribed any opioid pain medication today such as Norco, Vicodin, Percocet, morphine, hydrocodone, or oxycodone please make sure you do not drive when you are taking this medication as it can alter your ability to drive safely.  If you have any concerns once you are home that you are not improving or are in fact getting worse before you can make it to your follow-up appointment, please do not hesitate to call [phone] and come back for further evaluation.  Darel Hong, MD  Results for orders placed or performed during the hospital encounter of 74/08/14  Basic metabolic panel  Result Value Ref Range   Sodium 139 135 - 145 mmol/L   Potassium 3.7 3.5 - 5.1 mmol/L   Chloride 104 98 - 111 mmol/L   CO2 25 22 - 32 mmol/L   Glucose, Bld 171 (H) 70 - 99 mg/dL   BUN 12 8 - 23 mg/dL   Creatinine, Ser 0.78 0.61 - 1.24 mg/dL   Calcium 8.7 (L) 8.9 - 10.3 mg/dL   GFR calc non Af Amer >60 >60 mL/min   GFR calc Af Amer >60 >60 mL/min   Anion gap 10 5 - 15  CBC  Result Value Ref Range   WBC 9.7 3.8 - 10.6 K/uL   RBC 4.06 (L) 4.40 - 5.90 MIL/uL   Hemoglobin 14.8 13.0 - 18.0 g/dL   HCT 41.6 40.0 - 52.0 %   MCV 102.5 (H) 80.0 - 100.0 fL   MCH 36.5 (H) 26.0 - 34.0 pg   MCHC 35.6 32.0 - 36.0 g/dL   RDW 15.5 (H) 11.5 - 14.5 %   Platelets 145 (L) 150 - 440 K/uL  Troponin I  Result Value Ref Range   Troponin I <0.03 <0.03 ng/mL  Protime-INR  Result Value Ref Range   Prothrombin  Time 13.4 11.4 - 15.2 seconds   INR 1.03   APTT  Result Value Ref Range   aPTT 32 24 - 36 seconds  Digoxin level  Result Value Ref Range   Digoxin Level 0.5 (L) 0.8 - 2.0 ng/mL  Hepatic function panel  Result Value Ref Range   Total Protein 7.4 6.5 - 8.1 g/dL   Albumin 3.4 (L) 3.5 - 5.0 g/dL   AST 27 15 - 41 U/L   ALT 15 0 - 44 U/L   Alkaline Phosphatase 57 38 - 126 U/L   Total Bilirubin 0.4 0.3 - 1.2 mg/dL   Bilirubin, Direct <0.1 0.0 - 0.2 mg/dL   Indirect Bilirubin NOT CALCULATED 0.3 - 0.9 mg/dL   Dg Chest 2 View  Result Date: 04/11/2018 CLINICAL DATA:  Chest pain. EXAM: CHEST - 2 VIEW COMPARISON:  Radiographs of August 27, 2017. FINDINGS: The heart size and mediastinal contours are within normal limits. Both lungs are clear. No pneumothorax or pleural effusion is noted. The visualized skeletal structures are unremarkable. IMPRESSION: No active cardiopulmonary disease. Electronically Signed   By: Marijo Conception,  M.D.   On: 04/11/2018 17:44   Ct Angio Chest Pe W/cm &/or Wo Cm  Result Date: 04/11/2018 CLINICAL DATA:  Irregular heartbeat tachycardia.  Chest pain. EXAM: CT ANGIOGRAPHY CHEST WITH CONTRAST TECHNIQUE: Multidetector CT imaging of the chest was performed using the standard protocol during bolus administration of intravenous contrast. Multiplanar CT image reconstructions and MIPs were obtained to evaluate the vascular anatomy. CONTRAST:  1mL ISOVUE-370 IOPAMIDOL (ISOVUE-370) INJECTION 76% COMPARISON:  Chest x-ray April 11, 2018.  Chest CT May 06, 2010. FINDINGS: Cardiovascular: Mild atherosclerotic change in the thoracic aorta. No aneurysm or dissection. Cardiomegaly noted. Coronary artery calcifications involve both the right and left coronary arteries. No pulmonary emboli. Mediastinum/Nodes: No enlarged mediastinal, hilar, or axillary lymph nodes. Thyroid gland, trachea, and esophagus demonstrate no significant findings. Lungs/Pleura: Central airways are normal. No  pneumothorax. Pain and atelectasis. No suspicious infiltrate. No suspicious nodule or mass. Upper Abdomen: Nodular contour to the liver suggesting cirrhosis. Musculoskeletal: No chest wall abnormality. No acute or significant osseous findings. Review of the MIP images confirms the above findings. IMPRESSION: 1. No pulmonary emboli. 2. No cause for shortness of breath or chest pain noted. 3. Coronary artery calcifications. 4. Atherosclerotic changes in the nonaneurysmal aorta. 5. Nodular contour to the liver, suggesting cirrhosis. Aortic Atherosclerosis (ICD10-I70.0). Electronically Signed   By: Dorise Bullion III M.D   On: 04/11/2018 18:36

## 2018-04-11 NOTE — ED Notes (Signed)
Discussed with Dr. Burlene Arnt, new orders received for coags and normal chest pain protocols.

## 2018-04-11 NOTE — Telephone Encounter (Signed)
I reviewed the patient's HR readings with Dr. Rockey Situ. Recommendations are to have the patient proceed to the ER for further evaluation and treatment with a possible TEE/ DCCV.  Per Dr. Rockey Situ, if the patient refuses the ER, then he will need to re-think his plan of care for medical treatment over the weekend with TEE/ DCCV on Monday.   I have called and spoken with the patient.  He states he took an extra atenolol 25 mg on Wednesday night. He took digoxin 0.25 mg- 2 tablets (0.50) on Thursday morning. He took atenolol 25 mg BID yesterday. He took digoxin 0.25 mg this morning at 4:30 am. He taken atenolol 25 mg today.  I have advised the patient of Dr. Donivan Scull recommendations to report to the ER today for further evaluation and treatment. Per the patient, he states he has been told x 2 weeks by various providers to go to the ER, but as he was not having chest pain he refused to go. He states today that he would be agreeable to this if his daughter can take him. She is currently not at his house, but will be there this afternoon. I have advised him to call me back and let me know if he will be going to the ER so I can notify them. If not, he is aware we will need to re-visit with Dr. Rockey Situ to formulate a different plan for over the weekend.  The patient is agreeable and voices understanding.

## 2018-04-11 NOTE — ED Notes (Signed)
Pt back from CT

## 2018-04-11 NOTE — Patient Outreach (Signed)
[facility]) Care Management  04/11/2018  JAMILL WETMORE 1950-11-07 [id number]  Successful telephone outreach to follow up with patient after recent cardiology visit and identification of Aflutter heart rhythm. Per EMR patient has had issues with anxiety and increased shortness of breath with no decrease in HR since visit and addition of medication to slow rate. Patient states anxiety medication has been added and "it is helping a little". Patient has been instructed by cardiology to present to the ED this pm for possible TEE/DCCV. Current HR 131. Patient states he will see if his daughter (who does not drive) and her boyfriend will take him and if not his sister has agreed. Patient reminded to contact cardiology office prior to presenting to ED as instructed. RN CM will follow via EMR and follow up with patient post discharge.  Plan: Follow remotely for now.   Doni Bacha E. Rollene Rotunda RN, BSN [facility] Care Management Coordinator [phone]

## 2018-04-11 NOTE — ED Notes (Signed)
Pt returned from XR and placed in room 7

## 2018-04-11 NOTE — ED Provider Notes (Signed)
[facility] Emergency Department Provider Note  ____________________________________________   First MD Initiated Contact with Patient 04/11/18 1730     (approximate)  I have reviewed the triage vital signs and the nursing notes.   HISTORY  Chief Complaint Tachycardia and Chest Pain   HPI Lawrence Steele is a 67 y.o. male who was sent to the emergency department by his cardiologist for evaluation of possible pulmonary embolism.  He reports some shortness of breath and chest discomfort.  He has a long-standing history of COPD and CHF and has recently been diagnosed with atrial flutter which has been difficult to control.  His symptoms are mild in severity and constant.  They are worse with exertion improved with rest.  When I arrived in the room he says "I do not want to be here please get me home as quickly as possible".    Past Medical History:  Diagnosis Date  . AAA (abdominal aortic aneurysm) ([place])   . AAA (abdominal aortic aneurysm) ([place])   . Anxiety   . Asthma   . CHF (congestive heart failure) ([place])   . COPD (chronic obstructive pulmonary disease) ([place])   . Coronary artery disease   . Diabetes mellitus without complication ([place])   . ESRD (end stage renal disease) ([place])   . Hyperlipidemia   . Hypertension   . Iliac artery aneurysm, bilateral ([place])   . Obstructive sleep apnea-hypopnea syndrome   . Sleep apnea     Patient Active Problem List   Diagnosis Date Noted  . Atrial flutter ([place]) 04/09/2018  . Uncontrolled type 2 diabetes mellitus with hyperglycemia ([place]) 01/09/2018  . Nicotine dependence with current use 01/09/2018  . Diabetes mellitus without complication ([place]) 03/12/9484  . Myalgia 09/19/2017  . Vitamin B12 deficiency anemia 09/19/2017  . Common migraine with intractable migraine 09/19/2017  . Chronic hypoxemic respiratory failure ([place]) 09/19/2017  . Peripheral vascular disease, unspecified ([place]) 09/19/2017  . Dysuria 09/19/2017    . Osteoarthritis 09/19/2017  . Diverticulitis of intestine 09/19/2017  . Cervicalgia 09/19/2017  . Unspecified abdominal hernia without obstruction or gangrene 09/19/2017  . Vasomotor rhinitis 09/19/2017  . Occlusion and stenosis of bilateral carotid arteries 09/19/2017  . Generalized anxiety disorder 09/19/2017  . Malaise 09/19/2017  . Chronic bronchitis ([place]) 09/19/2017  . Obstructive sleep apnea (adult) (pediatric) 09/19/2017  . Cardiac arrhythmia 09/19/2017  . Smoker 04/02/2016  . Pain in the chest 08/20/2014  . SOB (shortness of breath) 08/20/2014  . Coronary artery disease involving native coronary artery of native heart with angina pectoris with documented spasm ([place]) 08/20/2014  . Emphysema of lung ([place]) 08/20/2014  . Coronary artery disease involving native coronary artery of native heart with unstable angina pectoris ([place]) 08/20/2014  . AAA (abdominal aortic aneurysm) without rupture ([place]) 08/20/2014  . Iliac artery aneurysm, bilateral ([place]) 08/20/2014  . Hyperlipidemia 08/20/2014  . Essential hypertension 08/20/2014  . Morbid obesity ([place]) 08/20/2014    Past Surgical History:  Procedure Laterality Date  . ABDOMINAL AORTIC ANEURYSM REPAIR  03/06/2015   [place]    . CARDIAC CATHETERIZATION  2010,03/16/2003,11/04/2002  . CORONARY ANGIOPLASTY  03/16/2003   stent to the RCA  & 2/18/2004stent mid circumflex  . ELBOW SURGERY    . HERNIA REPAIR    . PENILE PROSTHESIS IMPLANT  1995  . TONSILECTOMY, ADENOIDECTOMY, BILATERAL MYRINGOTOMY AND TUBES      Prior to Admission medications   Medication Sig Start Date End Date Taking? Authorizing Provider  ACCU-CHEK SOFTCLIX LANCETS lancets Use as instructed  Once a daily E11.65 01/30/18   Ronnell Freshwater, NP  acetaminophen (TYLENOL) 500 MG tablet Take 500 mg by mouth every 6 (six) hours as needed.     [provider]  albuterol (PROAIR HFA) 108 (90 Base) MCG/ACT inhaler Inhale 2 puffs into the lungs  every 6 (six) hours as needed. 02/05/18   Lavera Guise, MD  allopurinol (ZYLOPRIM) 100 MG tablet Take 1 tablet (100 mg total) by mouth 2 (two) times daily. 12/02/17   Ronnell Freshwater, NP  apixaban (ELIQUIS) 5 MG TABS tablet Take 1 tablet (5 mg total) by mouth 2 (two) times daily. 04/09/18   Minna Merritts, MD  atenolol (TENORMIN) 25 MG tablet Take 1 tablet (25 mg total) by mouth 2 (two) times daily. 04/09/18   Minna Merritts, MD  atorvastatin (LIPITOR) 20 MG tablet Take 1 tablet (20 mg total) by mouth daily. 10/15/17   Minna Merritts, MD  Blood Glucose Monitoring Suppl (ACCU-CHEK AVIVA PLUS) w/Device KIT Use once daily to check blood sugar. 01/24/18   Ronnell Freshwater, NP  busPIRone (BUSPAR) 10 MG tablet Take 1/2 to 1 tablet po bid prn anxiety 04/10/18   Ronnell Freshwater, NP  cyclobenzaprine (FLEXERIL) 10 MG tablet Take 1 tablet (10 mg total) by mouth 2 (two) times daily as needed for muscle spasms. 04/11/18   Ronnell Freshwater, NP  diclofenac sodium (VOLTAREN) 1 % GEL Apply topically 4 (four) times daily.    [provider]  digoxin (LANOXIN) 0.25 MG tablet Take 1 tablet (0.25 mg total) by mouth daily. 04/09/18   Minna Merritts, MD  doxycycline (VIBRA-TABS) 100 MG tablet Take 1 tablet (100 mg total) by mouth daily. For acne. 10/11/17   Ronnell Freshwater, NP  Fluticasone-Salmeterol (ADVAIR DISKUS) 250-50 MCG/DOSE AEPB Inhale 1 puff into the lungs 2 (two) times daily. Frequency:BID   Dosage:0.0     Instructions:  Note:Dose: 250-50MCG 02/05/18   Lavera Guise, MD  glimepiride (AMARYL) 1 MG tablet Take 2 tablets (2 mg total) by mouth daily with supper. 02/03/18   Ronnell Freshwater, NP  Glucose Blood (ACCU-CHEK AVIVA PLUS VI) by In Vitro route. Pt needs accu check Aviva plus lancets, test strips and meter to check blood sugars once daily    [provider]  glucose blood (ACCU-CHEK AVIVA PLUS) test strip 1 each by Other route as needed for other. Use as instructed 01/24/18   Ronnell Freshwater, NP  ipratropium (ATROVENT) 0.02 % nebulizer solution Use every 6 hours for Shortness of breath and wheezing and as needed.  Prescription is for 90 days 10/02/17   Ronnell Freshwater, NP  isosorbide mononitrate (IMDUR) 60 MG 24 hr tablet Take 60 mg by mouth daily.     [provider]  loratadine (CLARITIN) 10 MG tablet Take 10 mg by mouth daily as needed.     [provider]  meloxicam (MOBIC) 7.5 MG tablet Take 1 tab twice a day 03/04/18   Ronnell Freshwater, NP  metFORMIN (GLUCOPHAGE) 500 MG tablet Take 1 tablet (500 mg total) by mouth 2 (two) times daily with a meal. 12/02/17   Boscia, Greer Ee, NP  nitroGLYCERIN (NITROSTAT) 0.4 MG SL tablet Place 0.4 mg under the tongue.    [provider]  OXYGEN Inhale into the lungs. 2 liters @@ 24 hours daily.    [provider]  tiotropium (SPIRIVA) 18 MCG inhalation capsule  Place 18 mcg into inhaler and inhale daily. Pt using in place of Incruse Ellip[place]    [provider]  traMADol (ULTRAM) 50 MG tablet Take 1 tablet (50 mg total) by mouth 2 (two) times daily as needed. for pain 12/30/17   Ronnell Freshwater, NP  traZODone (DESYREL) 100 MG tablet Take 1 tablet (100 mg total) by mouth at bedtime. 03/04/18   Ronnell Freshwater, NP  umeclidinium bromide (INCRUSE ELLIPTA) 62.5 MCG/INH AEPB Inhale 1 puff into the lungs daily. 02/05/18   Lavera Guise, MD    Allergies Codeine  Family History  Problem Relation Age of Onset  . Hypertension Mother   . Hyperlipidemia Mother   . Depression Mother   . Heart attack Father 37  . Heart disease Father   . Hypertension Father   . Hyperlipidemia Father     Social History Social History   Tobacco Use  . Smoking status: Current Every Day Smoker    Packs/day: 0.25    Years: 45.00    Pack years: 11.25    Types: Cigarettes  . Smokeless tobacco: Former Systems developer    Types: Chew  . Tobacco comment: patient refused couseling information  Substance Use Topics  .  Alcohol use: Not Currently  . Drug use: No    Review of Systems Constitutional: No fever/chills Eyes: No visual changes. ENT: No sore throat. Cardiovascular: Positive for chest pain. Respiratory: Positive for shortness of breath. Gastrointestinal: No abdominal pain.  No nausea, no vomiting.  No diarrhea.  No constipation. Genitourinary: Negative for dysuria. Musculoskeletal: Negative for back pain. Skin: Negative for rash. Neurological: Negative for headaches, focal weakness or numbness.   ____________________________________________   PHYSICAL EXAM:  VITAL SIGNS: ED Triage Vitals  Enc Vitals Group     BP 04/11/18 1702 103/68     Pulse Rate 04/11/18 1702 (!) 130     Resp 04/11/18 1702 (!) 22     Temp 04/11/18 1702 98.6 F (37 C)     Temp Source 04/11/18 1702 Oral     SpO2 04/11/18 1702 97 %     Weight 04/11/18 1713 254 lb (115.2 kg)     Height 04/11/18 1713 _0  (1.778 m)     Head Circumference --      Peak Flow --      Pain Score --      Pain Loc --      Pain Edu? --      Excl. in [place]? --     Constitutional: Alert and oriented x4 somewhat anxious appearing nontoxic no diaphoresis Eyes: PERRL EOMI. Head: Atraumatic. Nose: No congestion/rhinnorhea. Mouth/Throat: No trismus Neck: No stridor.   Cardiovascular: Tachycardic rate, regular rhythm. Grossly normal heart sounds.  Good peripheral circulation. Respiratory: Slightly increased respiratory effort.  No retractions. Lungs CTAB and moving good air Gastrointestinal: Soft nontender Musculoskeletal: No lower extremity edema   Neurologic:  Normal speech and language. No gross focal neurologic deficits are appreciated. Skin:  Skin is warm, dry and intact. No rash noted. Psychiatric: Mood and affect are normal. Speech and behavior are normal.    ____________________________________________   DIFFERENTIAL includes but not limited to  Pulmonary embolism, medication noncompliance, atrial fibrillation, atrial  flutter, acute coronary syndrome ____________________________________________   LABS (all labs ordered are listed, but only abnormal results are displayed)  Labs Reviewed  BASIC METABOLIC PANEL - Abnormal; Notable for the following components:      Result Value   Glucose, Bld 171 (*)  Calcium 8.7 (*)    All other components within normal limits  CBC - Abnormal; Notable for the following components:   RBC 4.06 (*)    MCV 102.5 (*)    MCH 36.5 (*)    RDW 15.5 (*)    Platelets 145 (*)    All other components within normal limits  DIGOXIN LEVEL - Abnormal; Notable for the following components:   Digoxin Level 0.5 (*)    All other components within normal limits  HEPATIC FUNCTION PANEL - Abnormal; Notable for the following components:   Albumin 3.4 (*)    All other components within normal limits  TROPONIN I  PROTIME-INR  APTT    Lab work reviewed by me with therapeutic digoxin level and no signs of acute ischemia __________________________________________  EKG  ED ECG REPORT I, Darel Hong, the attending physician, personally viewed and interpreted this ECG.  Date: 04/14/2018 EKG Time: 1659 Rate: 127 Rhythm: Atrial flutter with rapid ventricular response Intervals: normal ST/T Wave abnormalities: normal Narrative Interpretation: no evidence of acute ischemia  ED ECG REPORT I, Darel Hong, the attending physician, personally viewed and interpreted this ECG.  Date: 04/14/2018 EKG Time: 1735 Rate: 130 Rhythm: Atrial flutter with rapid ventricular response Intervals: normal ST/T Wave abnormalities: normal Narrative Interpretation: no evidence of acute ischemia  ____________________________________________  RADIOLOGY  CT angiogram reviewed by me with no acute clot noted ____________________________________________   PROCEDURES  Procedure(s) performed: Yes  .Cardioversion Date/Time: 04/11/2018 7:00 PM Performed by: Darel Hong, MD Authorized  by: Darel Hong, MD   Consent:    Consent obtained:  Verbal   Consent given by:  Patient   Risks discussed:  Death, induced arrhythmia and pain   Alternatives discussed:  Alternative treatment and rate-control medication Pre-procedure details:    Cardioversion basis:  Emergent   Rhythm:  Atrial flutter Patient sedated: No Post-procedure details:    Patient status:  Awake   Patient tolerance of procedure:  Tolerated well, no immediate complications Comments:     Given 12 mg of IV adenosine which uncovered flutter waves.  He went back into atrial flutter with rapid ventricular response   .Critical Care Performed by: Darel Hong, MD Authorized by: Darel Hong, MD   Critical care provider statement:    Critical care time (minutes):  30   Critical care time was exclusive of:  Separately billable procedures and treating other patients   Critical care was necessary to treat or prevent imminent or life-threatening deterioration of the following conditions:  Circulatory failure   Critical care was time spent personally by me on the following activities:  Development of treatment plan with patient or surrogate, discussions with consultants, evaluation of patient's response to treatment, examination of patient, obtaining history from patient or surrogate, ordering and performing treatments and interventions, ordering and review of laboratory studies, ordering and review of radiographic studies, pulse oximetry, re-evaluation of patient's condition and review of old charts    Critical Care performed: Yes  ____________________________________________   INITIAL IMPRESSION / ASSESSMENT AND PLAN / ED COURSE  Pertinent labs & imaging results that were available during my care of the patient were reviewed by me and considered in my medical decision making (see chart for details).   As part of my medical decision making, I reviewed the following data within the electronic medical  record:  History obtained from family if available, nursing notes, old chart and ekg, as well as notes from prior ED visits.  The patient arrives in atrial  flutter at about 130 bpm.  On chart review she was sent to the emergency department by his cardiologist for possible admission and cardioversion.  He is adamant that he will not be admitted to the hospital.  Initially his heart rate did seem to fluctuate somewhat with position so I did give a trial of adenosine which uncovered the clear flutter waves.  He does have new chest pain or shortness of breath so we will get a CT scan to evaluate for pulmonary embolism.  Labs are pending.     ----------------------------------------- 7:35 PM on 04/11/2018 -----------------------------------------  The patient remains tachycardic to 127 although is asymptomatic.  CT is fortunately reassuring.  I explained to the patient that I recommend inpatient admission and so did his cardiologist Dr. Rockey Situ however he again refuses saying "you should use this bed for someone else who is sick I want to go home".  He does agree to stay for oral diltiazem as well as magnesium.  After magnesium and diltiazem his heart rate is come down under 100.  The patient is alert and oriented x4 and has capacity to make medical decisions.  He is leaving the hospital [place]. ____________________________________________   FINAL CLINICAL IMPRESSION(S) / ED DIAGNOSES  Final diagnoses:  Atrial flutter with rapid ventricular response ([place])      NEW MEDICATIONS STARTED DURING THIS VISIT:  Discharge Medication List as of 04/11/2018  7:35 PM       Note:  This document was prepared using Dragon voice recognition software and may include unintentional dictation errors.     Darel Hong, MD 04/14/18 [phone]

## 2018-04-11 NOTE — Telephone Encounter (Signed)
I called and spoke with Mel Almond to inquire about the patient's symptoms.  She states the patient reports he feels fine at rest, but she notes that he is very SOB, even with his O2, when he is up walking around. I advised I will review this with Dr. Rockey Situ.  I will call the patient directly with Dr. Donivan Scull recommendations.

## 2018-04-11 NOTE — ED Notes (Signed)
Digoxin level added on to labs, spoke with Vania Rea

## 2018-04-11 NOTE — ED Triage Notes (Addendum)
Pt arrived via POV with reports of irregular heart beat and tachycardia. Pt also reports chest pain. Pt was sent here by Dr. Rockey Situ.  Pt states the doctor is concerned for possible blood clots.  Pt states he took a pill to help slow the heart beat, but does not know the name of it.  Pt short of breath speaking in full sentences.   Pt recently started on Eliquis.

## 2018-04-11 NOTE — Telephone Encounter (Signed)
The patient called back and stated he will have his daughter take him to the ER this afternoon.  He should be there around 4:30 pm or so. I have called the [facility] ER and spoken with the triage nurse to notify her that the patient should be coming in due to atrial flutter with a HR around 136 bpm.

## 2018-04-11 NOTE — Progress Notes (Signed)
Pt given sandwich tray.  Agreed to stay and receive medications.

## 2018-04-11 NOTE — Telephone Encounter (Signed)
Bailey from [employer] calling patient Vitals  BP 140/102 hr 136   Please advise for this is high for patient

## 2018-04-13 NOTE — Telephone Encounter (Signed)
I see an incomplete ER note from 7/26 Did he leave hospital? Rate was elevated, Needed diltiazem infusion, Then TEE cardioversion to restore NSR

## 2018-04-14 ENCOUNTER — Other Ambulatory Visit: Payer: Self-pay

## 2018-04-14 NOTE — Telephone Encounter (Signed)
Heart rate?

## 2018-04-14 NOTE — Patient Outreach (Signed)
[facility]) Care Management  04/14/2018  Lawrence Steele 1951-09-05 [id number]  Care Coordination: Unsuccessful telephone encounter in attempt to follow-up with patient regarding ED visit on Friday 04/11/18 for Aflutter with RVR, and to schedule home visit for this week. HIPAA compliant VM message left with RN CM information requesting call back.  Plan: follow up call in 3-4 days   [name] E. Rollene Rotunda RN, BSN [facility] Care Management Coordinator [phone]

## 2018-04-14 NOTE — Telephone Encounter (Addendum)
Patient was in ED 7/26 and left AMA after recommendation for admission by ED MD and cardiologist. Patient is scheduled to see Dr Rockey Situ on 05/07/18.

## 2018-04-14 NOTE — Telephone Encounter (Signed)
S/w patient.  He states his HR is still elevated. BP/HR readings: Saturday- 118/71  131    90/57  129   106/52  100   11767  130   125/58  119 Sunday-  114/54  126   105/58  128   151/79  131  Today, Mon 141/84  130   141/71  125  We discussed the possibility of him needing a TEE/Cardioversion and what that was. He verbalized understanding. He said the ER doctor asked him if he wanted to stay or not. Patient opted to leave because he stated "there wasn't anything else they were going to do." Advised patient that he may have needed to stay in order to have the procedure.  Patient has appointment with Dr Rockey Situ on 05/07/18. He says if he has to have a procedure he will need to ask his sister for a ride.  He denies chest pain or shortness of breath. He is on home O2. Advised I will route to Dr Rockey Situ for further advice.

## 2018-04-14 NOTE — Telephone Encounter (Signed)
Ideally needs a TEE and cardioversion. Any provider anytime whenever he can get a ride Rate is markedly elevated

## 2018-04-15 ENCOUNTER — Telehealth: Payer: Self-pay | Admitting: Nurse Practitioner

## 2018-04-15 ENCOUNTER — Other Ambulatory Visit: Payer: Self-pay

## 2018-04-15 NOTE — Patient Outreach (Signed)
[place] [facility]) Care Management  04/15/2018  Lawrence Steele [birth year] [id number]   Follow-up assessment/Care Coordination: Successful telephone outreach encounter to the above patient after RN CM received a callback from him earlier this am. Patient states he is still not doing well. States his HR ranges from 65-135 per BP cuff and pulse oximeter. Anxiety better. Discussed with patient his desire to not be admitted to the hospital per EMR documentation encounter on Friday 7/26. Patient states he was never told he needed to be admitted and "the doctor asked me if I wanted to stay or go home". "I asked him if I would lay in a hospital bed all weekend until Monday and he said probably. I told him I could do that at home". Patient states he received a call from cardiology office yesterday stating he still needs a TEE/DCCV. Patient currently waiting for call back from cardiology to advise. Patient states he is willing to have any procedure or hospitalization that will improve his heart rate. RN CM confirmed patient is taking medications as prescribed including eliquis, atenolol, and digoxin. Patient also states he is taking new medication for anxiety, neuropathy and back pain.   Plan: Home visit scheduled for Thursday August 1   Lawrence Steele E. Rollene Rotunda RN, BSN [facility] Care Management Coordinator [phone]

## 2018-04-15 NOTE — Telephone Encounter (Signed)
I called and spoke with the patient.  I advised him that the 1st outpatient TEE/ DCCV that I could schedule him for is Wednesday 04/23/18. He states he has another appointment that day.  I offered Thursday 04/24/18 and he is agreeable with this.  He is aware I will contact scheduling to confirm and call him back.  He states that his HR's have been anywhere from 62-131 today.  He reports feeling some better as ortho has placed him on some different meds to help with some discomfort. I have advised him that if his HR's remain persistently elevated in the 130's to go to the ER and STAY, as they can perform an inpatient TEE/ DCCV sooner than next week.  The patient voices understanding and is agreeable.   I have left a message for scheduling to confirm they can schedule the TEE/ DCCV for 04/24/18 at 7:30 am with Dr. Saunders Revel.

## 2018-04-15 NOTE — Telephone Encounter (Signed)
Pharmacy called in regards to a Rx that was written for Lawrence Steele, that they wanted to know if they can d/c the last refill of Tramadol because he was being seen by another provider that wrote him two new Rx for Norco and gabapentin,  [facility], and was told by the doctor there not to take the tramadol with the norco.  Will follow up with Dr Humphrey Rolls and see what she is wanting to do.

## 2018-04-17 ENCOUNTER — Other Ambulatory Visit: Payer: Self-pay

## 2018-04-17 ENCOUNTER — Encounter: Payer: Self-pay | Admitting: *Deleted

## 2018-04-17 NOTE — Patient Outreach (Signed)
[facility]) Care Management  04/17/2018  Lawrence Steele [birth year] [id number]  Case Conference: Multidisciplinary Case Discussion on the above patient completed today to discuss continuation of RN CM services beyond 90 days related to new onset of Aflutter with RVR and need for TEE/DCCV in the setting of educational needs regarding condition, new medications, need for surgical intervention. No additional interventions or plans suggested by members of multidisciplinary team related to patients medical or social  needs. RN CM will continue to collaborate medical providers and follow patient on a monthly and as needed basis until discharge.  Lawrence Steele E. Rollene Rotunda RN, BSN [facility] Care Management Coordinator [phone]

## 2018-04-17 NOTE — Patient Outreach (Signed)
[facility]) Care Management   04/17/2018  ZACARI STIFF 1951/03/10 [id number]  Lawrence Steele is an 67 y.o. male  Subjective: Patient states he has no energy. His HR remains elevated 130s consistently. States he had chest tightness relieved with 1 ntg on Monday. States he spoke with cardiology on Wednesday and emergency s/s were discussed and when to call 911. Patient states he is scheduled for TEE/DCCV on Thursday 04/24/18.  Objective:   BP 126/80 (BP Location: Right Arm, Patient Position: Sitting, Cuff Size: Large)   Pulse (!) 135   Resp 20   SpO2 97%   Physical Exam  Constitutional: He is oriented to person, place, and time. He appears well-developed and well-nourished.  Cardiovascular: Regular rhythm, normal heart sounds and intact distal pulses.  Tachycardic 135  Respiratory: Effort normal. No respiratory distress. He has no wheezes. He has no rales.  Clear upper lobes bilat with diminished breath sounds in bases.   GI: Soft. Bowel sounds are normal.  Musculoskeletal: He exhibits edema.  Decreased ROM right hip, mild pitting edema to lower legs/ankles bilat  Neurological: He is alert and oriented to person, place, and time.  Skin: Skin is warm and dry.  Psychiatric: He has a normal mood and affect. His behavior is normal. Judgment and thought content normal.    Encounter Medications:   Outpatient Encounter Medications as of 04/17/2018  Medication Sig Note  . ACCU-CHEK SOFTCLIX LANCETS lancets Use as instructed  Once a daily E11.65   . acetaminophen (TYLENOL) 500 MG tablet Take 500 mg by mouth every 6 (six) hours as needed.    Marland Kitchen albuterol (PROAIR HFA) 108 (90 Base) MCG/ACT inhaler Inhale 2 puffs into the lungs every 6 (six) hours as needed.   Marland Kitchen allopurinol (ZYLOPRIM) 100 MG tablet Take 1 tablet (100 mg total) by mouth 2 (two) times daily.   Marland Kitchen apixaban (ELIQUIS) 5 MG TABS tablet Take 1 tablet (5 mg total) by mouth 2 (two) times daily.   Marland Kitchen atenolol (TENORMIN) 25  MG tablet Take 1 tablet (25 mg total) by mouth 2 (two) times daily.   Marland Kitchen atorvastatin (LIPITOR) 20 MG tablet Take 1 tablet (20 mg total) by mouth daily.   . Blood Glucose Monitoring Suppl (ACCU-CHEK AVIVA PLUS) w/Device KIT Use once daily to check blood sugar.   . busPIRone (BUSPAR) 10 MG tablet Take 1/2 to 1 tablet po bid prn anxiety   . cyclobenzaprine (FLEXERIL) 10 MG tablet Take 1 tablet (10 mg total) by mouth 2 (two) times daily as needed for muscle spasms.   . diclofenac sodium (VOLTAREN) 1 % GEL Apply topically 4 (four) times daily.   . digoxin (LANOXIN) 0.25 MG tablet Take 1 tablet (0.25 mg total) by mouth daily.   Marland Kitchen doxycycline (VIBRA-TABS) 100 MG tablet Take 1 tablet (100 mg total) by mouth daily. For acne.   . Fluticasone-Salmeterol (ADVAIR DISKUS) 250-50 MCG/DOSE AEPB Inhale 1 puff into the lungs 2 (two) times daily. Frequency:BID   Dosage:0.0     Instructions:  Note:Dose: 250-50MCG   . gabapentin (NEURONTIN) 300 MG capsule gabapentin 300 mg capsule  1 PO QHS X 7 DAYS THEN 1 PO BID   . glimepiride (AMARYL) 1 MG tablet Take 2 tablets (2 mg total) by mouth daily with supper.   . Glucose Blood (ACCU-CHEK AVIVA PLUS VI) by In Vitro route. Pt needs accu check Aviva plus lancets, test strips and meter to check blood sugars once daily   . glucose blood (ACCU-CHEK  AVIVA PLUS) test strip 1 each by Other route as needed for other. Use as instructed   . HYDROcodone-acetaminophen (NORCO) 5-325 MG tablet Norco 5 mg-325 mg tablet  1 PO BID   . ipratropium (ATROVENT) 0.02 % nebulizer solution Use every 6 hours for Shortness of breath and wheezing and as needed.  Prescription is for 90 days   . isosorbide mononitrate (IMDUR) 60 MG 24 hr tablet Take 60 mg by mouth daily.    Marland Kitchen loratadine (CLARITIN) 10 MG tablet Take 10 mg by mouth daily as needed.    . meloxicam (MOBIC) 7.5 MG tablet Take 1 tab twice a day   . metFORMIN (GLUCOPHAGE) 500 MG tablet Take 1 tablet (500 mg total) by mouth 2 (two) times daily  with a meal.   . nitroGLYCERIN (NITROSTAT) 0.4 MG SL tablet Place 0.4 mg under the tongue. 01/31/2018: Available if needed.   . OXYGEN Inhale into the lungs. 2 liters @@ 24 hours daily.   Marland Kitchen tiotropium (SPIRIVA) 18 MCG inhalation capsule Place 18 mcg into inhaler and inhale daily. Pt using in place of Incruse [place]ipita   . traMADol (ULTRAM) 50 MG tablet Take 1 tablet (50 mg total) by mouth 2 (two) times daily as needed. for pain   . traZODone (DESYREL) 100 MG tablet Take 1 tablet (100 mg total) by mouth at bedtime.   Marland Kitchen umeclidinium bromide (INCRUSE ELLIPTA) 62.5 MCG/INH AEPB Inhale 1 puff into the lungs daily.    No facility-administered encounter medications on file as of 04/17/2018.     Functional Status:   In your present state of health, do you have any difficulty performing the following activities: 01/31/2018 04/19/2017  Hearing? N N  Vision? N Y  Difficulty concentrating or making decisions? N Y  Walking or climbing stairs? Y Y  Dressing or bathing? N N  Doing errands, shopping? Tempie Donning  Preparing Food and eating ? N Y  Using the Toilet? N N  In the past six months, have you accidently leaked urine? N Y  Do you have problems with loss of bowel control? N Y  Managing your Medications? N N  Managing your Finances? Tempie Donning  Comment daughter assists  daughter assist with filling out money orders to pay bills and put's them in the mail  Housekeeping or managing your Housekeeping? Y Y  Comment - daughter assist with housekeeping  Some recent data might be hidden    Fall/Depression Screening:    Fall Risk  01/31/2018 01/30/2018 01/09/2018  Falls in the past year? Yes Yes Yes  Number falls in past yr: 1 1 1   Injury with Fall? No No -  Risk for fall due to : - - -  Follow up - - -   PHQ 2/9 Scores 01/31/2018 01/30/2018 01/09/2018 09/30/2017 09/19/2017 04/19/2017  PHQ - 2 Score 2 0 0 0 6 2  PHQ- 9 Score 4 - - - 19 5    Assessment:  Scheduled home visit to assess HR and medication  compliance/reconciliation after new onset of Aflutter with RVR originally noted on 7/18 by [facility] PT. Patient initially followed by [facility] RN CM for DM education. RN CM had planned to transfer patient to Disease Management on 7/22 however patient will continue to be followed by RN CM related to new cardiac condition.  Tachycardia continues. Patient's stamina and strength are significantly reduced. He is only able to ambulate short distances in the home without having to sit related to fatigue and shortness of  breath. No evidence of significant fluid overload. Lungs remain clear, diminished in bases. Patient denies cough. RN CM reinforced s/s of cardiac emergency including chest pain pressure not relieved with 2 nitro, shortness of breath not resolved with rest, and dizziness/fainting. Patient verbalizes understanding.   Patient continues to take all medications as prescribed. He is concerned that once he has taken all samples of Eliquis and has filled his 30 day free prescription, he will not be able to afford his medication. Patient states he will be in the "doughnut hole" soon. RN CM placed pharmacy referral for medication assistance.  Patient states he is having to pay a lot of money out in copays. States his copay for each MD visit is 35.00 plus transportation which is 3.00 each way. Patients daughter and sister are not always available for appointments. RN CM discussed [employer] transportation benefit with patient. Patient states he will call for further information. Denies need for CSW.  Continues to follow care plan with checking blood glucose as instructed. Today's fasting CBG 124. Patient has been recently prescribed Buspar for anxiety related to elevated HR but states it is making him extremely hungry and he is afraid he will gain weight. RN CM reinforced healthy snacking.  Patient continues to work with PT. Narcotic and Neurontin and helping with pain control and allowing patient to do more without pain.  He will increase his Neurontin dose to BID after 7 days. He is hopeful that he can begin to walk for exercise once his heart rate is controlled.  Plan: Follow up phone call in 2 weeks post TEE/DCCV  [facility] CM Care Plan Problem One     Most Recent Value  Care Plan Problem One  Diabetes- self management   Role Documenting the Problem One  Care Management [place] for Problem One  Active  [facility] Long Term Goal   Pt's AIC would decrease one point in the next 90 days   [facility] Long Term Goal Start Date  01/31/18  Interventions for Problem One Long Term Goal  reinforced importance of following all selfcare education provided during past 90 days, encouraged patient to keep appointment with PCP on 04/22/18 for DM follow-up and A1C check  [facility] CM Short Term Goal #1   Pt would obtain his own glucometer, continue to check sugars within the next 30 days  [Addendum- late entry for short term goal #1 ]  [facility] CM Short Term Goal #1 Start Date  01/31/18  [facility] CM Short Term Goal #1 Met Date  02/26/18  [facility] CM Short Term Goal #2   Pt would have better understanding of diabetic diet within the next 30 days  [Addendum: late entry for short term goal #2 for 5/17 h/v]  [facility] CM Short Term Goal #2 Start Date  01/31/18  [facility] CM Short Term Goal #2 Met Date  02/26/18  [facility] CM Short Term Goal #3  patient will transition from loaner glucometer to his new glucometer over the next 30 days  [facility] CM Short Term Goal #3 Start Date  03/10/18    [facility] CM Care Plan Problem Two     Most Recent Value  Care Plan Problem Two  chronic back pain-self care  Role Documenting the Problem Two  Care Management [place] for Problem Two  Active  Interventions for Problem Two Long Term Goal   patient encouraged to take new prescription of gabapentin as prescribed and continue to participate in PT  [place]  Goal  patient will be able to perform ADLs with less pain over the next 31 days  [facility] Long Term Goal Start Date  04/17/18  [date recet]  [facility] CM Short Term Goal #1   patient will communicate effectively with MD at next appointment effects of current pain on QOL  [facility] CM Short Term Goal #1 Start Date  03/10/18  [facility] CM Short Term Goal #1 Met Date   04/08/18    [facility] CM Care Plan Problem Three     Most Recent Value  Care Plan Problem Three  high risk for hospitalization related to new onset of Aflutter with RVR  Role Documenting the Problem Three  Care Management [place] for Problem Three  Active  [facility] Long Term Goal   patient will not have acute hospital admission related to elevated HR over the next 31 days  [facility] Long Term Goal Start Date  04/17/18  Interventions for Problem Three Long Term Goal  discussed causes of arythmias, importance of wearing O2, decrease in caffine, taking medications a prescribed, emergiency symptoms, medication compliance  [facility] CM Short Term Goal #1   patient will take all medications as prescribed over the next 30 days  [facility] CM Short Term Goal #1 Start Date  04/17/18  Interventions for Short Term Goal #1  medication reconciliation complete  [facility] CM Short Term Goal #2   patient will decrease amount of coffee intake from 6 cups daily to 2 cups daily over the next  [facility] CM Short Term Goal #2 Start Date  04/17/18  Interventions for Short Term Goal #2  discussed effects of caffine on HR     Raylen Ken E. Rollene Rotunda RN, BSN [facility] Care Management Coordinator [phone]

## 2018-04-17 NOTE — Telephone Encounter (Signed)
You are scheduled for a TEE/ Cardioversion on Thursday 04/24/18 with Dr. Saunders Revel. Please arrive at the [facility] at 6:30 am on the day of your procedure.  DIET INSTRUCTIONS:  Nothing to eat or drink after midnight except your medications with a              sip of water.         1) Labs: n/a  2) Medications:  You may take all of your regular medications the morning of your procedure with a small amount of water EXCEPT- DO NOT TAKE- glimepiride & metformin the morning of your procedure  3) Must have a responsible person to drive you home.  4) Bring a current list of your medications and current insurance cards.    If you have any questions after you get home, please call the office at [phone]    I have called and spoken with the patient. He has been given verbal instructions for his TEE/ DCCV scheduled for 04/24/18 with Dr. Saunders Revel. Letter of instructions also mailed to the patient.

## 2018-04-18 ENCOUNTER — Ambulatory Visit: Payer: Self-pay | Admitting: Cardiovascular Disease

## 2018-04-21 ENCOUNTER — Other Ambulatory Visit: Payer: Self-pay | Admitting: Cardiovascular Disease

## 2018-04-21 DIAGNOSIS — I483 Typical atrial flutter: Secondary | ICD-10-CM

## 2018-04-22 ENCOUNTER — Ambulatory Visit (INDEPENDENT_AMBULATORY_CARE_PROVIDER_SITE_OTHER): Payer: Medicare HMO | Admitting: Nurse Practitioner

## 2018-04-22 ENCOUNTER — Encounter: Payer: Self-pay | Admitting: Nurse Practitioner

## 2018-04-22 VITALS — BP 118/98 | HR 131 | Resp 16 | Ht 70.0 in | Wt 260.0 lb

## 2018-04-22 DIAGNOSIS — I482 Chronic atrial fibrillation, unspecified: Secondary | ICD-10-CM

## 2018-04-22 DIAGNOSIS — J42 Unspecified chronic bronchitis: Secondary | ICD-10-CM

## 2018-04-22 DIAGNOSIS — F411 Generalized anxiety disorder: Secondary | ICD-10-CM | POA: Diagnosis not present

## 2018-04-22 DIAGNOSIS — E1165 Type 2 diabetes mellitus with hyperglycemia: Secondary | ICD-10-CM | POA: Diagnosis not present

## 2018-04-22 DIAGNOSIS — I10 Essential (primary) hypertension: Secondary | ICD-10-CM

## 2018-04-22 LAB — POCT GLYCOSYLATED HEMOGLOBIN (HGB A1C): Hemoglobin A1C: 6.4 % — AB (ref 4.0–5.6)

## 2018-04-22 MED ORDER — FLUOXETINE HCL 10 MG PO CAPS: 10.0000 mg | ORAL_CAPSULE | Freq: Every day | ORAL | 3 refills | Status: DC

## 2018-04-22 NOTE — Progress Notes (Signed)
[facility] [place], [place] [address]  Internal MEDICINE  Office Visit Note  Patient Name: Lawrence Steele  [id number]  [id number]  Date of Service: 05/02/2018  Chief Complaint  Patient presents with  . Diabetes    3 month follow up    Increased anxiety and depression since development of a-fib. Did add as needed dose of buspirone. States that it makes his legs tingly. Also increases his appetite significantly. Has gained close to 20 pounds since his last visit.   Diabetes  He presents for his follow-up diabetic visit. He has type 2 diabetes mellitus. No MedicAlert identification noted. His disease course has been improving. Hypoglycemia symptoms include headaches and nervousness/anxiousness. Associated symptoms include blurred vision, chest pain and fatigue. Pertinent negatives for diabetes include no polydipsia and no polyuria. There are no hypoglycemic complications. Symptoms are improving. Diabetic complications include peripheral neuropathy. Risk factors for coronary artery disease include dyslipidemia, diabetes mellitus, hypertension, male sex, sedentary lifestyle, stress and tobacco exposure. Current diabetic treatment includes oral agent (monotherapy). He is compliant with treatment all of the time. His weight is increasing steadily. He is following a generally healthy diet. Meal planning includes avoidance of concentrated sweets. He has not had a previous visit with a dietitian. He rarely participates in exercise. His home blood glucose trend is decreasing steadily. An ACE inhibitor/angiotensin II receptor blocker is not being taken.       Current Medication: Outpatient Encounter Medications as of 04/22/2018  Medication Sig Note  . ACCU-CHEK SOFTCLIX LANCETS lancets Use as instructed  Once a daily E11.65   . acetaminophen (TYLENOL) 500 MG tablet Take 500 mg by mouth every 6 (six) hours as needed.    Marland Kitchen albuterol (PROAIR HFA) 108 (90 Base) MCG/ACT inhaler  Inhale 2 puffs into the lungs every 6 (six) hours as needed.   Marland Kitchen allopurinol (ZYLOPRIM) 100 MG tablet Take 1 tablet (100 mg total) by mouth 2 (two) times daily.   Marland Kitchen apixaban (ELIQUIS) 5 MG TABS tablet Take 1 tablet (5 mg total) by mouth 2 (two) times daily.   Marland Kitchen atenolol (TENORMIN) 25 MG tablet Take 1 tablet (25 mg total) by mouth 2 (two) times daily. (Patient taking differently: Take 25 mg by mouth daily. )   . atorvastatin (LIPITOR) 20 MG tablet Take 1 tablet (20 mg total) by mouth daily.   . Blood Glucose Monitoring Suppl (ACCU-CHEK AVIVA PLUS) w/Device KIT Use once daily to check blood sugar.   . busPIRone (BUSPAR) 10 MG tablet Take 1/2 to 1 tablet po bid prn anxiety (Patient not taking: Reported on 04/28/2018)   . cyclobenzaprine (FLEXERIL) 10 MG tablet Take 1 tablet (10 mg total) by mouth 2 (two) times daily as needed for muscle spasms. (Patient taking differently: Take 10 mg by mouth 2 (two) times daily. )   . diclofenac sodium (VOLTAREN) 1 % GEL Apply 2 g topically 4 (four) times daily.    . Fluticasone-Salmeterol (ADVAIR DISKUS) 250-50 MCG/DOSE AEPB Inhale 1 puff into the lungs 2 (two) times daily. Frequency:BID   Dosage:0.0     Instructions:  Note:Dose: 250-50MCG   . gabapentin (NEURONTIN) 300 MG capsule 300 mg 2 (two) times daily.  05/01/2018: Increasing to 981m BID next week  . glimepiride (AMARYL) 1 MG tablet Take 2 tablets (2 mg total) by mouth daily with supper.   . Glucose Blood (ACCU-CHEK AVIVA PLUS VI) by In Vitro route. Pt needs accu check Aviva plus lancets, test strips and meter to check  blood sugars once daily   . glucose blood (ACCU-CHEK AVIVA PLUS) test strip 1 each by Other route as needed for other. Use as instructed   . HYDROcodone-acetaminophen (NORCO) 5-325 MG tablet Take 1 tablet by mouth 2 (two) times daily.    . hydroxypropyl methylcellulose / hypromellose (ISOPTO TEARS / GONIOVISC) 2.5 % ophthalmic solution Place 1 drop into both eyes daily as needed for dry eyes.   Marland Kitchen  ipratropium (ATROVENT) 0.02 % nebulizer solution Use every 6 hours for Shortness of breath and wheezing and as needed.  Prescription is for 90 days   . isosorbide mononitrate (IMDUR) 60 MG 24 hr tablet Take 60 mg by mouth daily.    Marland Kitchen loratadine (CLARITIN) 10 MG tablet Take 10 mg by mouth daily.    . meloxicam (MOBIC) 7.5 MG tablet Take 1 tab twice a day   . metFORMIN (GLUCOPHAGE) 500 MG tablet Take 1 tablet (500 mg total) by mouth 2 (two) times daily with a meal.   . nitroGLYCERIN (NITROSTAT) 0.4 MG SL tablet Place 0.4 mg under the tongue. 01/31/2018: Available if needed.   . OXYGEN Inhale 2 L into the lungs continuous.    . traZODone (DESYREL) 100 MG tablet Take 1 tablet (100 mg total) by mouth at bedtime.   Marland Kitchen umeclidinium bromide (INCRUSE ELLIPTA) 62.5 MCG/INH AEPB Inhale 1 puff into the lungs daily.   . [DISCONTINUED] digoxin (LANOXIN) 0.25 MG tablet Take 1 tablet (0.25 mg total) by mouth daily.   . [DISCONTINUED] traMADol (ULTRAM) 50 MG tablet Take 1 tablet (50 mg total) by mouth 2 (two) times daily as needed. for pain   . doxycycline (VIBRA-TABS) 100 MG tablet Take 1 tablet (100 mg total) by mouth daily. For acne. 05/01/2018: Prn when experiencing acne flare  . FLUoxetine (PROZAC) 10 MG capsule Take 1 capsule (10 mg total) by mouth daily.    No facility-administered encounter medications on file as of 04/22/2018.     Surgical History: Past Surgical History:  Procedure Laterality Date  . ABDOMINAL AORTIC ANEURYSM REPAIR  03/06/2015   [place]    . APPENDECTOMY    . CARDIAC CATHETERIZATION  2010,03/16/2003,11/04/2002  . CARDIOVERSION N/A 04/24/2018   Procedure: CARDIOVERSION;  Surgeon: Nelva Bush, MD;  Location: [facility] ORS;  Service: Cardiovascular;  Laterality: N/A;  . CORONARY ANGIOPLASTY  03/16/2003   stent to the RCA  & 2/18/2004stent mid circumflex  . ELBOW SURGERY    . HERNIA REPAIR    . NASAL SINUS SURGERY    . PENILE PROSTHESIS IMPLANT  1995  . PILONIDAL  CYST EXCISION    . TEE WITHOUT CARDIOVERSION N/A 04/24/2018   Procedure: TRANSESOPHAGEAL ECHOCARDIOGRAM (TEE);  Surgeon: Nelva Bush, MD;  Location: [facility] ORS;  Service: Cardiovascular;  Laterality: N/A;  . TONSILECTOMY, ADENOIDECTOMY, BILATERAL MYRINGOTOMY AND TUBES    . UMBILICAL HERNIA REPAIR      Medical History: Past Medical History:  Diagnosis Date  . AAA (abdominal aortic aneurysm) ([place])   . AAA (abdominal aortic aneurysm) ([place])   . Anxiety   . Asthma   . CHF (congestive heart failure) ([place])   . COPD (chronic obstructive pulmonary disease) ([place])   . Coronary artery disease   . Diabetes mellitus without complication ([place])   . ESRD (end stage renal disease) ([place])   . Hyperlipidemia   . Hypertension   . Iliac artery aneurysm, bilateral ([place])   . Obstructive sleep apnea-hypopnea syndrome   . Sleep apnea  Family History: Family History  Problem Relation Age of Onset  . Hypertension Mother   . Hyperlipidemia Mother   . Depression Mother   . Heart attack Father 56  . Heart disease Father   . Hypertension Father   . Hyperlipidemia Father     Social History   Socioeconomic History  . Marital status: Divorced    Spouse name: Not on file  . Number of children: 2  . Years of education: 10   . Highest education level: 10th grade  Occupational History  . Occupation: disabled   Social Needs  . Financial resource strain: Somewhat hard  . Food insecurity:    Worry: Never true    Inability: Never true  . Transportation needs:    Medical: No    Non-medical: No  Tobacco Use  . Smoking status: Current Every Day Smoker    Packs/day: 0.25    Years: 45.00    Pack years: 11.25    Types: Cigarettes  . Smokeless tobacco: Former Systems developer    Types: Chew  . Tobacco comment: patient refused couseling information  Substance and Sexual Activity  . Alcohol use: Not Currently  . Drug use: No  . Sexual activity: Not on file  Lifestyle  . Physical activity:    Days per week:  7 days    Minutes per session: 10 min  . Stress: Only a little  Relationships  . Social connections:    Talks on phone: More than three times a week    Gets together: More than three times a week    Attends religious service: Never    Active member of club or organization: No    Attends meetings of clubs or organizations: Not on file    Relationship status: Divorced  . Intimate partner violence:    Fear of current or ex partner: Not on file    Emotionally abused: Not on file    Physically abused: Not on file    Forced sexual activity: Not on file  Other Topics Concern  . Not on file  Social History Narrative  . Not on file      Review of Systems  Constitutional: Positive for fatigue. Negative for activity change and unexpected weight change.  HENT: Negative for postnasal drip, rhinorrhea and sore throat.   Eyes: Positive for blurred vision. Negative for photophobia, pain, discharge, redness, itching and visual disturbance.  Respiratory: Positive for cough and shortness of breath. Negative for wheezing.   Cardiovascular: Positive for chest pain and palpitations.       Patient recently diagnosed with a-fib. Successfully cardioverted   Gastrointestinal: Negative for constipation, diarrhea, nausea and vomiting.       Decreased appetite  Endocrine: Negative for polydipsia and polyuria.       Patient with history of diabetes. Does not check blood sugars at home. Feels good, though. Admits he forgets to take both doses of medications some time. Knows that his blood sugars have been elevated .  Musculoskeletal: Positive for arthralgias, back pain and myalgias.  Skin: Negative for rash.  Allergic/Immunologic: Positive for environmental allergies.  Neurological: Positive for headaches.  Hematological: Negative for adenopathy.  Psychiatric/Behavioral: Positive for dysphoric mood. The patient is nervous/anxious.     Today's Vitals   04/22/18 0847  BP: (!) 118/98  Pulse: (!) 131   Resp: 16  SpO2: 95%  Weight: 260 lb (117.9 kg)  Height: 5' 10" (1.778 m)   Physical Exam  Constitutional: He is oriented to person,  place, and time. He appears well-developed and well-nourished.  HENT:  Head: Normocephalic.  Nose: Nose normal.  Mouth/Throat: Oropharynx is clear and moist.  Eyes: Pupils are equal, round, and reactive to light. Conjunctivae and EOM are normal.  Neck: Normal range of motion. Neck supple. No JVD present. Carotid bruit is not present. No tracheal deviation present. No thyromegaly present.  Cardiovascular: Normal rate. An irregular rhythm present. Frequent extrasystoles are present.  Murmur heard.  Systolic murmur is present with a grade of 2/6. Irregular heart rhythm.   Pulmonary/Chest: Effort normal. No accessory muscle usage. No respiratory distress. He has wheezes. He has rhonchi in the right upper field, the right middle field, the left upper field and the left middle field.  Slightly congested lung sounds which clear with cough. Wearing oxygen at 2 liters per minute.  Abdominal: Soft. Bowel sounds are normal. There is no tenderness.  Musculoskeletal: Normal range of motion.  Lymphadenopathy:    He has no cervical adenopathy.  Neurological: He is alert and oriented to person, place, and time.  Skin: Skin is warm and dry.  Psychiatric: His speech is normal and behavior is normal. Judgment and thought content normal. His mood appears anxious. Cognition and memory are normal. He exhibits a depressed mood.  Nursing note and vitals reviewed.  Assessment/Plan: 1. Uncontrolled type 2 diabetes mellitus with hyperglycemia (HCC) - POCT HgB A1C much better today at 6.4. Continue diabetic medication as prescribed. Refer for diabetic eye exam.  - Ambulatory referral to Ophthalmology  2. Essential hypertension Stable. Continue bp medication as prescribed.   3. Generalized anxiety disorder Start fluoxetine 74m daily. Continue to take buspirone as needed and  as prescribed.  - FLUoxetine (PROZAC) 10 MG capsule; Take 1 capsule (10 mg total) by mouth daily.  Dispense: 30 capsule; Refill: 3  4. Chronic a-fib ([facility] Reviewed progress notes from cardiology with patient. Continue regular visits with cardiology as scheduled  5. Chronic bronchitis, unspecified chronic bronchitis type ([place] Continue inhalers and respiratory medications as prescribed. Oxygen at 2 liters per minute.  General Counseling: Alfreddie verbalizes understanding of the findings of todays visit and agrees with plan of treatment. I have discussed any further diagnostic evaluation that may be needed or ordered today. We also reviewed his medications today. he has been encouraged to call the office with any questions or concerns that should arise related to todays visit.  Diabetes Counseling:  1. Addition of ACE inh/ ARB'S for nephroprotection. Microalbumin is updated  2. Diabetic foot care, prevention of complications. Podiatry consult 3. Exercise and lose weight.  4. Diabetic eye examination, Diabetic eye exam is updated  5. Monitor blood sugar closlely. nutrition counseling.  6. Sign and symptoms of hypoglycemia including shaking sweating,confusion and headaches.   This patient was seen by HLeretha PolFNP Collaboration with Dr FLavera Guiseas a part of collaborative care agreement  Orders Placed This Encounter  Procedures  . Ambulatory referral to Ophthalmology  . POCT HgB A1C    Meds ordered this encounter  Medications  . FLUoxetine (PROZAC) 10 MG capsule    Sig: Take 1 capsule (10 mg total) by mouth daily.    Dispense:  30 capsule    Refill:  3    Order Specific Question:   Supervising Provider    Answer:   KLavera Guise[[id number]]    Time spent: 237Minutes      Dr FLavera GuiseInternal medicine

## 2018-04-24 ENCOUNTER — Encounter
Admission: RE | Disposition: A | Discharge status: Home / Self Care | Payer: Self-pay | Source: Ambulatory Visit | Attending: Internal Medicine

## 2018-04-24 ENCOUNTER — Ambulatory Visit (HOSPITAL_BASED_OUTPATIENT_CLINIC_OR_DEPARTMENT_OTHER)
Admission: RE | Admit: 2018-04-24 | Discharge: 2018-04-24 | Disposition: A | Discharge status: Home / Self Care | Payer: Medicare HMO | Source: Ambulatory Visit | Attending: Cardiovascular Disease | Admitting: Cardiovascular Disease

## 2018-04-24 ENCOUNTER — Ambulatory Visit
Admission: RE | Admit: 2018-04-24 | Discharge: 2018-04-24 | Disposition: A | Discharge status: Home / Self Care | Payer: Medicare HMO | Source: Ambulatory Visit | Attending: Internal Medicine | Admitting: Internal Medicine

## 2018-04-24 ENCOUNTER — Ambulatory Visit: Payer: Medicare HMO | Admitting: Registered Nurse

## 2018-04-24 ENCOUNTER — Encounter: Payer: Self-pay | Admitting: *Deleted

## 2018-04-24 DIAGNOSIS — Z79899 Other long term (current) drug therapy: Secondary | ICD-10-CM | POA: Insufficient documentation

## 2018-04-24 DIAGNOSIS — F17210 Nicotine dependence, cigarettes, uncomplicated: Secondary | ICD-10-CM | POA: Insufficient documentation

## 2018-04-24 DIAGNOSIS — Z8249 Family history of ischemic heart disease and other diseases of the circulatory system: Secondary | ICD-10-CM | POA: Insufficient documentation

## 2018-04-24 DIAGNOSIS — G4733 Obstructive sleep apnea (adult) (pediatric): Secondary | ICD-10-CM | POA: Diagnosis not present

## 2018-04-24 DIAGNOSIS — I4892 Unspecified atrial flutter: Secondary | ICD-10-CM | POA: Diagnosis present

## 2018-04-24 DIAGNOSIS — Z7982 Long term (current) use of aspirin: Secondary | ICD-10-CM | POA: Insufficient documentation

## 2018-04-24 DIAGNOSIS — Z9889 Other specified postprocedural states: Secondary | ICD-10-CM | POA: Insufficient documentation

## 2018-04-24 DIAGNOSIS — I483 Typical atrial flutter: Secondary | ICD-10-CM

## 2018-04-24 DIAGNOSIS — I5032 Chronic diastolic (congestive) heart failure: Secondary | ICD-10-CM | POA: Insufficient documentation

## 2018-04-24 DIAGNOSIS — E1122 Type 2 diabetes mellitus with diabetic chronic kidney disease: Secondary | ICD-10-CM | POA: Diagnosis not present

## 2018-04-24 DIAGNOSIS — I361 Nonrheumatic tricuspid (valve) insufficiency: Secondary | ICD-10-CM

## 2018-04-24 DIAGNOSIS — E785 Hyperlipidemia, unspecified: Secondary | ICD-10-CM | POA: Insufficient documentation

## 2018-04-24 DIAGNOSIS — Z9981 Dependence on supplemental oxygen: Secondary | ICD-10-CM | POA: Insufficient documentation

## 2018-04-24 DIAGNOSIS — I132 Hypertensive heart and chronic kidney disease with heart failure and with stage 5 chronic kidney disease, or end stage renal disease: Secondary | ICD-10-CM | POA: Diagnosis not present

## 2018-04-24 DIAGNOSIS — Z7951 Long term (current) use of inhaled steroids: Secondary | ICD-10-CM | POA: Diagnosis not present

## 2018-04-24 DIAGNOSIS — I25111 Atherosclerotic heart disease of native coronary artery with angina pectoris with documented spasm: Secondary | ICD-10-CM | POA: Diagnosis not present

## 2018-04-24 DIAGNOSIS — Z955 Presence of coronary angioplasty implant and graft: Secondary | ICD-10-CM | POA: Insufficient documentation

## 2018-04-24 DIAGNOSIS — Z7984 Long term (current) use of oral hypoglycemic drugs: Secondary | ICD-10-CM | POA: Diagnosis not present

## 2018-04-24 DIAGNOSIS — Z992 Dependence on renal dialysis: Secondary | ICD-10-CM | POA: Diagnosis not present

## 2018-04-24 DIAGNOSIS — N186 End stage renal disease: Secondary | ICD-10-CM | POA: Diagnosis not present

## 2018-04-24 DIAGNOSIS — Z888 Allergy status to other drugs, medicaments and biological substances status: Secondary | ICD-10-CM | POA: Diagnosis not present

## 2018-04-24 DIAGNOSIS — J432 Centrilobular emphysema: Secondary | ICD-10-CM | POA: Diagnosis not present

## 2018-04-24 DIAGNOSIS — Z6836 Body mass index (BMI) 36.0-36.9, adult: Secondary | ICD-10-CM | POA: Insufficient documentation

## 2018-04-24 HISTORY — PX: TEE WITHOUT CARDIOVERSION: SHX5443

## 2018-04-24 HISTORY — PX: CARDIOVERSION: EP1203

## 2018-04-24 LAB — GLUCOSE, CAPILLARY: GLUCOSE-CAPILLARY: 168 mg/dL — AB (ref 70–99)

## 2018-04-24 SURGERY — ECHOCARDIOGRAM, TRANSESOPHAGEAL
Anesthesia: General

## 2018-04-24 SURGERY — CARDIOVERSION (CATH LAB)
Anesthesia: General

## 2018-04-24 MED ORDER — PHENYLEPHRINE HCL 10 MG/ML IJ SOLN
INTRAMUSCULAR | Status: DC | PRN
  Administered 2018-04-24: 100 ug via INTRAVENOUS

## 2018-04-24 MED ORDER — MIDAZOLAM HCL 2 MG/2ML IJ SOLN
INTRAMUSCULAR | Status: AC
  Filled 2018-04-24: qty 2

## 2018-04-24 MED ORDER — PROPOFOL 10 MG/ML IV BOLUS
INTRAVENOUS | Status: DC | PRN
  Administered 2018-04-24: 10 mg via INTRAVENOUS
  Administered 2018-04-24: 20 mg via INTRAVENOUS
  Administered 2018-04-24 (×2): 5 mg via INTRAVENOUS
  Administered 2018-04-24: 10 mg via INTRAVENOUS
  Administered 2018-04-24: 60 mg via INTRAVENOUS

## 2018-04-24 MED ORDER — PROPOFOL 10 MG/ML IV BOLUS
INTRAVENOUS | Status: AC
  Filled 2018-04-24: qty 20

## 2018-04-24 MED ORDER — FENTANYL CITRATE (PF) 100 MCG/2ML IJ SOLN
INTRAMUSCULAR | Status: AC
  Filled 2018-04-24: qty 2

## 2018-04-24 MED ORDER — FENTANYL CITRATE (PF) 100 MCG/2ML IJ SOLN
INTRAMUSCULAR | Status: AC
  Filled 2018-04-24: qty 4

## 2018-04-24 MED ORDER — MIDAZOLAM HCL 2 MG/2ML IJ SOLN
INTRAMUSCULAR | Status: DC | PRN
  Administered 2018-04-24 (×2): 1 mg via INTRAVENOUS

## 2018-04-24 MED ORDER — LIDOCAINE VISCOUS HCL 2 % MT SOLN
OROMUCOSAL | Status: AC
  Filled 2018-04-24: qty 15

## 2018-04-24 MED ORDER — MIDAZOLAM HCL 5 MG/5ML IJ SOLN
INTRAMUSCULAR | Status: AC
  Filled 2018-04-24: qty 10

## 2018-04-24 MED ORDER — PERFLUTREN LIPID MICROSPHERE
1.0000 mL | INTRAVENOUS | Status: AC | PRN
  Administered 2018-04-24: 2 mL via INTRAVENOUS
  Filled 2018-04-24: qty 10

## 2018-04-24 MED ORDER — BUTAMBEN-TETRACAINE-BENZOCAINE 2-2-14 % EX AERO
INHALATION_SPRAY | CUTANEOUS | Status: AC
  Filled 2018-04-24: qty 5

## 2018-04-24 MED ORDER — SODIUM CHLORIDE 0.9 % IV SOLN
INTRAVENOUS | Status: DC
  Administered 2018-04-24: 08:00:00 via INTRAVENOUS

## 2018-04-24 MED ORDER — FENTANYL CITRATE (PF) 100 MCG/2ML IJ SOLN
INTRAMUSCULAR | Status: DC | PRN
  Administered 2018-04-24 (×2): 25 ug via INTRAVENOUS

## 2018-04-24 NOTE — CV Procedure (Signed)
    Transesophageal Echocardiogram Note  Lawrence Steele [id number] 09/15/1951  Procedure: Transesophageal Echocardiogram Indications: Atrial flutter  Procedure Details Consent: Obtained Time Out: Verified patient identification, verified procedure, site/side was marked, verified correct patient position, special equipment/implants available, Radiology Safety Procedures followed,  medications/allergies/relevent history reviewed, required imaging and test results available.  Performed  Medications:  The patient was sedated by anesthesia services.  Left Ventrical:  Normal function.  Mitral Valve: Normal with trival MR.  Aortic Valve: Mildly thickened and calcified.  No AI.  Tricuspid Valve: Suboptimally imaged.  Mild TR.  Pulmonic Valve: Suboptimally imaged.  No PI.  Left Atrium/ Left atrial appendage: No LA/LAA appendage thrombus, though LAA was partially obscured by shadowing.  Definity was administered to enhance visualization of the LAA.  Atrial septum: Intact by color Doppler.  Lipomatous hypertrophy noted.  Aorta: Heterogenous plaquing of the descending aorta.  Complications: No apparent complications Patient did tolerate procedure well.   Nelva Bush, MD 04/24/2018, 4:08 PM

## 2018-04-24 NOTE — Interval H&P Note (Signed)
History and Physical Interval Note:  04/24/2018 7:16 AM  Lawrence Steele  has presented today for TEE/cardioversion, with the diagnosis of atrial flutter. The various methods of treatment have been discussed with the patient and family. After consideration of risks, benefits and other options for treatment, the patient has consented to  Procedure(s): TRANSESOPHAGEAL ECHOCARDIOGRAM (TEE) (N/A) CARDIOVERSION (N/A) as a surgical intervention .  The patient's history has been reviewed, patient examined, no change in status, stable for surgery.  I have reviewed the patient's chart and labs.  Questions were answered to the patient's satisfaction.     Dev Dhondt

## 2018-04-24 NOTE — Transfer of Care (Signed)
Immediate Anesthesia Transfer of Care Note  Patient: Lawrence Steele  Procedure(s) Performed: Procedure(s): TRANSESOPHAGEAL ECHOCARDIOGRAM (TEE) (N/A) CARDIOVERSION (N/A)  Patient Location: PACU and Short Stay  Anesthesia Type:General  Level of Consciousness: awake, alert  and oriented  Airway & Oxygen Therapy: Patient Spontanous Breathing and Patient connected to nasal cannula oxygen  Post-op Assessment: Report given to RN and Post -op Vital signs reviewed and stable  Post vital signs: Reviewed and stable  Last Vitals:  Vitals:   04/24/18 0816 04/24/18 0818  BP: 90/62 (!) 79/52  Pulse: 84 82  Resp: 15 12  Temp:    SpO2: 77% 41%    Complications: No apparent anesthesia complications

## 2018-04-24 NOTE — Progress Notes (Signed)
*  PRELIMINARY RESULTS* Echocardiogram Echocardiogram Transesophageal has been performed.  Lawrence Steele 04/24/2018, 8:18 AM

## 2018-04-24 NOTE — Anesthesia Post-op Follow-up Note (Signed)
Anesthesia QCDR form completed.        

## 2018-04-24 NOTE — Discharge Instructions (Signed)
Electrical Cardioversion °Electrical cardioversion is the delivery of a jolt of electricity to restore a normal rhythm to the heart. A rhythm that is too fast or is not regular keeps the heart from pumping well. In this procedure, sticky patches or metal paddles are placed on the chest to deliver electricity to the heart from a device. °This procedure may be done in an emergency if: °· There is low or no blood pressure as a result of the heart rhythm. °· Normal rhythm must be restored as fast as possible to protect the brain and heart from further damage. °· It may save a life. ° °This procedure may also be done for irregular or fast heart rhythms that are not immediately life-threatening. °Tell a health care provider about: °· Any allergies you have. °· All medicines you are taking, including vitamins, herbs, eye drops, creams, and over-the-counter medicines. °· Any problems you or family members have had with anesthetic medicines. °· Any blood disorders you have. °· Any surgeries you have had. °· Any medical conditions you have. °· Whether you are pregnant or may be pregnant. °What are the risks? °Generally, this is a safe procedure. However, problems may occur, including: °· Allergic reactions to medicines. °· A blood clot that breaks free and travels to other parts of your body. °· The possible return of an abnormal heart rhythm within hours or days after the procedure. °· Your heart stopping (cardiac arrest ). This is rare. ° °What happens before the procedure? °Medicines °· Your health care provider may have you start taking: °? Blood-thinning medicines (anticoagulants) so your blood does not clot as easily. °? Medicines may be given to help stabilize your heart rate and rhythm. °· Ask your health care provider about changing or stopping your regular medicines. This is especially important if you are taking diabetes medicines or blood thinners. °General instructions °· Plan to have someone take you home from  the hospital or clinic. °· If you will be going home right after the procedure, plan to have someone with you for 24 hours. °· Follow instructions from your health care provider about eating or drinking restrictions. °What happens during the procedure? °· To lower your risk of infection: °? Your health care team will wash or sanitize their hands. °? Your skin will be washed with soap. °· An IV tube will be inserted into one of your veins. °· You will be given a medicine to help you relax (sedative). °· Sticky patches (electrodes) or metal paddles may be placed on your chest. °· An electrical shock will be delivered. °The procedure may vary among health care providers and hospitals. °What happens after the procedure? °· Your blood pressure, heart rate, breathing rate, and blood oxygen level will be monitored until the medicines you were given have worn off. °· Do not drive for 24 hours if you were given a sedative. °· Your heart rhythm will be watched to make sure it does not change. °This information is not intended to replace advice given to you by your health care provider. Make sure you discuss any questions you have with your health care provider. °Document Released: 08/24/2002 Document Revised: 05/02/2016 Document Reviewed: 03/09/2016 °Elsevier Interactive Patient Education © 2017 [employer] ° °

## 2018-04-24 NOTE — Anesthesia Procedure Notes (Signed)
Performed by: Isela Stantz, CRNA Pre-anesthesia Checklist: Patient identified, Emergency Drugs available, Suction available and Patient being monitored Patient Re-evaluated:Patient Re-evaluated prior to induction Oxygen Delivery Method: Nasal cannula Induction Type: IV induction Dental Injury: Teeth and Oropharynx as per pre-operative assessment  Comments: Nasal cannula with etCO2 monitoring       

## 2018-04-24 NOTE — Anesthesia Postprocedure Evaluation (Signed)
Anesthesia Post Note  Patient: SONG GARRIS  Procedure(s) Performed: TRANSESOPHAGEAL ECHOCARDIOGRAM (TEE) (N/A ) CARDIOVERSION (N/A )  Patient location during evaluation: Cath Lab Anesthesia Type: General Level of consciousness: awake and alert Pain management: pain level controlled Vital Signs Assessment: post-procedure vital signs reviewed and stable Respiratory status: spontaneous breathing, nonlabored ventilation, respiratory function stable and patient connected to nasal cannula oxygen Cardiovascular status: blood pressure returned to baseline and stable Postop Assessment: no apparent nausea or vomiting Anesthetic complications: no     Last Vitals:  Vitals:   04/24/18 0845 04/24/18 0857  BP: 100/70 91/68  Pulse: 78 77  Resp: 16 16  Temp:    SpO2: 96% 96%    Last Pain:  Vitals:   04/24/18 0857  TempSrc:   PainSc: 0-No pain                 Martha Clan

## 2018-04-24 NOTE — CV Procedure (Signed)
    Cardioversion Note  Lawrence Steele [id number] Dec 12, 1950  Procedure: DC Cardioversion Indications: Atrial flutter  Procedure Details Consent: Obtained Time Out: Verified patient identification, verified procedure, site/side was marked, verified correct patient position, special equipment/implants available, Radiology Safety Procedures followed,  medications/allergies/relevent history reviewed, required imaging and test results available.  Performed  The patient has been on adequate anticoagulation.  TEE immediately before the procedure showed no evidence of LA/RA thrombus.  The patient was sedated by anesthesia services. Synchronous cardioversion was performed at 200 joules.  The cardioversion was successful with restoration of sinus rhythm.  Complications: No apparent complications Patient did tolerate procedure well.  Nelva Bush., MD 04/24/2018, 4:07 PM

## 2018-04-24 NOTE — Anesthesia Preprocedure Evaluation (Signed)
Anesthesia Evaluation  Patient identified by MRN, date of birth, ID band Patient awake    Reviewed: Allergy & Precautions, H&P , NPO status , Patient's Chart, lab work & pertinent test results, reviewed documented beta blocker date and time   History of Anesthesia Complications Negative for: history of anesthetic complications  Airway Mallampati: I  TM Distance: >3 FB Neck ROM: full    Dental  (+) Dental Advidsory Given, Caps, Teeth Intact Permanent bridge on top right:   Pulmonary shortness of breath (with a fib) and with exertion, asthma , sleep apnea and Continuous Positive Airway Pressure Ventilation , COPD,  COPD inhaler, neg recent URI, Current Smoker,           Cardiovascular Exercise Tolerance: Good hypertension, + angina (with a fib) + CAD, + Past MI, + Cardiac Stents, + Peripheral Vascular Disease and +CHF  (-) CABG + dysrhythmias Atrial Fibrillation (-) Valvular Problems/Murmurs     Neuro/Psych PSYCHIATRIC DISORDERS Anxiety negative neurological ROS     GI/Hepatic negative GI ROS, Neg liver ROS,   Endo/Other  diabetes  Renal/GU CRF and ESRFRenal disease  negative genitourinary   Musculoskeletal   Abdominal   Peds  Hematology negative hematology ROS (+)   Anesthesia Other Findings Past Medical History: No date: AAA (abdominal aortic aneurysm) (HCC) No date: AAA (abdominal aortic aneurysm) (HCC) No date: Anxiety No date: Asthma No date: CHF (congestive heart failure) (HCC) No date: COPD (chronic obstructive pulmonary disease) (HCC) No date: Coronary artery disease No date: Diabetes mellitus without complication (HCC) No date: ESRD (end stage renal disease) (HCC) No date: Hyperlipidemia No date: Hypertension No date: Iliac artery aneurysm, bilateral (HCC) No date: Obstructive sleep apnea-hypopnea syndrome No date: Sleep apnea   Reproductive/Obstetrics negative OB ROS                              Anesthesia Physical  Anesthesia Plan  ASA: IV  Anesthesia Plan: General   Post-op Pain Management:    Induction: Intravenous  PONV Risk Score and Plan: 1 and TIVA  Airway Management Planned: Nasal Cannula  Additional Equipment:   Intra-op Plan:   Post-operative Plan:   Informed Consent: I have reviewed the patients History and Physical, chart, labs and discussed the procedure including the risks, benefits and alternatives for the proposed anesthesia with the patient or authorized representative who has indicated his/her understanding and acceptance.   Dental Advisory Given  Plan Discussed with: Anesthesiologist, CRNA and Surgeon  Anesthesia Plan Comments:         Anesthesia Quick Evaluation  

## 2018-04-24 NOTE — Brief Op Note (Signed)
Brief Procedure Note  04/24/2018  8:21 AM  PATIENT:  Lawrence Steele  67 y.o. male  PRE-OPERATIVE DIAGNOSIS:  Atrial flutter  POST-OPERATIVE DIAGNOSIS:  Atrial flutter  PROCEDURE:  Procedure(s): TRANSESOPHAGEAL ECHOCARDIOGRAM (TEE) (N/A) CARDIOVERSION (N/A)  SURGEON:  Surgeon(s) and Role:    Nelva Bush, MD - Primary  FINDINGS: 1. No LAA/LA or RA thrombus. 2. Successful DCCV with restoration of sinus rhythm.  RECOMMENDATIONS: 1. Discontinue digoxin. 2. Continue apixaban for at least 1 month. 3. F/u in office in ~1 week for EKG to ensure maintenance of NSR.  Nelva Bush, MD [facility] Pager: [phone]

## 2018-04-28 ENCOUNTER — Other Ambulatory Visit: Payer: Self-pay

## 2018-04-28 NOTE — Patient Outreach (Signed)
[place] [facility], Lawrence Steele [facility]) Care Management  04/28/2018  Lawrence Steele 10/20/1950 [id number]  Telephone Assessment: Successful telephone encounter to the above patient to follow up on recent TEE/DCCV. Patient states he is doing well. Daily HR range from 50s-102 per pulse oximeter. Patient states higher HR is immediately post exertion. Patient states he remains "short winded" with any exertion but feels much better now that his heart is not "pounding". Patient acknowledges discontinuation of Digoxin and continuation of all other medication on medication profile including Eliquis BID. Medication adherence reinforced. Patient continues to use his oxygen "most of time" to reduce his shortness of breath. Denies any lower extremity swelling or bloating. Denies productive cough or increasing congestion. O2 saturations remain at baseline 87-"high 90s". Patient confirmed follow up appointment next week with Cardiologist. Transportation will be provided by sister or public transportation.  Patient continues to do well with managing his DM. Recent PCP follow up indicates patients A1C has decreased from 9.2-6.4 in 3 months. Patient continues to check sugars daily, take medications as prescribed and follows a modified diabetic diet. He is eager to add walking back to his routine once his HR and back/leg pain are better managed.  Plan: Follow up home visit in 1 month  [facility] CM Care Plan Problem One     Most Recent Value  Care Plan Problem One  Diabetes- self management   Role Documenting the Problem One  Care Management [place] for Problem One  Active  [facility] Long Term Goal   Pt's AIC would decrease one point in the next 90 days   [facility] Long Term Goal Start Date  01/31/18  [facility] Long Term Goal Met Date  04/28/18  [facility] CM Short Term Goal #1   Pt would obtain his own glucometer, continue to check sugars within the next 30 days  [Addendum- late entry for short term goal #1 ]  [facility] CM Short Term Goal #1  Start Date  01/31/18  [facility] CM Short Term Goal #1 Met Date  02/26/18  [facility] CM Short Term Goal #2   Pt would have better understanding of diabetic diet within the next 30 days  [Addendum: late entry for short term goal #2 for 5/17 h/v]  [facility] CM Short Term Goal #2 Start Date  01/31/18  [facility] CM Short Term Goal #2 Met Date  02/26/18  [facility] CM Short Term Goal #3  patient will transition from loaner glucometer to his new glucometer over the next 30 days  [facility] CM Short Term Goal #3 Start Date  03/10/18    [facility] CM Care Plan Problem Two     Most Recent Value  Care Plan Problem Two  chronic back pain-self care  Role Documenting the Problem Two  Care Management Coordinator  Care Plan for Problem Two  Active  [facility] Long Term Goal  patient will be able to perform ADLs with less pain over the next 31 days  [facility] Long Term Goal Start Date  04/17/18 [date recet]  [facility] CM Short Term Goal #1   patient will communicate effectively with MD at next appointment effects of current pain on QOL  [facility] CM Short Term Goal #1 Start Date  03/10/18  [facility] CM Short Term Goal #1 Met Date   04/08/18    [facility] CM Care Plan Problem Three     Most Recent Value  Care Plan Problem Three  high risk for hospitalization related to new onset of Aflutter with RVR  Role Documenting the  Problem Three  Care Management Coordinator  Care Plan for Problem Three  Active  [facility] Long Term Goal   patient will not have acute hospital admission related to elevated HR over the next 31 days  [facility] Long Term Goal Start Date  04/17/18  Interventions for Problem Three Long Term Goal  reinforced importance of taking medications daily and when to notify cardiology of tachycardia symptoms  [facility]N CM Short Term Goal #1   patient will take all medications as prescribed over the next 30 days  [facility] CM Short Term Goal #1 Start Date  04/17/18  Interventions for Short Term Goal #1  reinforced medication adherance and discontinuation of digoxin  THN CM Short Term Goal #2   patient  will decrease amount of coffee intake from 6 cups daily to 2 cups daily over the next  [facility] CM Short Term Goal #2 Start Date  04/17/18  Interventions for Short Term Goal #2  reinforced decreasing caffine consumption      Lawrence Steele E. Rollene Rotunda RN, BSN [facility] Care Management Coordinator [phone]

## 2018-04-29 ENCOUNTER — Telehealth: Payer: Self-pay | Admitting: Internal Medicine

## 2018-04-29 ENCOUNTER — Other Ambulatory Visit: Payer: Self-pay | Admitting: Pharmacist

## 2018-04-29 NOTE — Telephone Encounter (Signed)
-----   Message from Emily Filbert, RN sent at 04/29/2018 10:56 AM EDT ----- Regarding: FW: Post DCCV EKG Please call patient to arrange for a follow up EKG this week per Dr. Saunders Revel- post cardioversion.   Thanks! ----- Message ----- From: Nelva Bush, MD Sent: 04/28/2018   9:16 AM EDT To: [facility] Triage Subject: Post DCCV EKG                                  Good morning,  Would it be possible to have Mr. Selbe come in sometime this week for an EKG following last week's cardioversion?  Please let me know if any issues arise.  Thanks.  Gerald Stabs

## 2018-04-29 NOTE — Patient Outreach (Signed)
Lawrence Steele, Lawrence Steele) Care Management  04/29/2018  Lawrence Steele Jun 19, 1951 [id number]   67 y.o. year old male referred to [place] for Medication Assistance (Eliquis) Referred by Lawrence Steele, [place] for medication assistance with Eliquis, prescribed by Lawrence Steele.   Patient has previously worked with Lawrence Steele [facility] PharmD) and Lawrence Steele ([place]) in May/June 2019  for medication assistance for Ventolin, Advair, and Incruse.   Patient with [facility] Advantage plan.   Patient confirms identity with HIPAA-identifiers x2 and gives verbal consent to speak over the phone about medications.   Scheduled home visit with patient to review medications. Will bring BMS application for Eliquis to home visit. Patient was educated on what paperwork is necessary for application and verbalized understanding.   Lawrence Steele, PharmD Clinical Pharmacist, [facility] [phone]

## 2018-04-29 NOTE — Telephone Encounter (Signed)
Patient scheduled for 8/16 at 2 .  He may have to cancel due to transportation .  He will call back if he has to cancel .

## 2018-05-01 ENCOUNTER — Other Ambulatory Visit: Payer: Self-pay | Admitting: Pharmacy Technician

## 2018-05-01 ENCOUNTER — Other Ambulatory Visit: Payer: Self-pay | Admitting: Pharmacist

## 2018-05-01 NOTE — Patient Outreach (Signed)
[place] [facility]) Care Management  05/01/2018  Lawrence Steele [birth year] [id number]   Received patient portion of [name] patient assistance application for Eliquis from [place]. Faxed provider portion to Dr. Rockey Situ.  Will submit completed application and required documents to company once items are received.  Maud Deed [place], [place] Management [phone]

## 2018-05-02 ENCOUNTER — Ambulatory Visit: Payer: Self-pay

## 2018-05-02 DIAGNOSIS — I4891 Unspecified atrial fibrillation: Secondary | ICD-10-CM | POA: Insufficient documentation

## 2018-05-02 NOTE — Patient Outreach (Signed)
[facility]) Care Management  [place]   05/02/2018  ZVI DUPLANTIS 1950-11-14 [id number]  Subjective: 67 y.o. year old male referred to [place] for Medication Assistance (Eliquis) Referred by Trish Fountain, [place] for medication assistance with Eliquis, prescribed by Dr. Rockey Situ.   Patient has previously worked with Harlow Asa [facility] PharmD) and [name] ([place]) in May/June 2019  for medication assistance for Ventolin, Advair, and Incruse.   Patient with [facility] Advantage plan.   Patient confirms identity with HIPAA identifiers x 2 and gives verbal consent to speak about medications. Visit conducted in patient's home.   Objective:   Encounter Medications: Outpatient Encounter Medications as of 05/01/2018  Medication Sig Note  . ACCU-CHEK SOFTCLIX LANCETS lancets Use as instructed  Once a daily E11.65   . acetaminophen (TYLENOL) 500 MG tablet Take 500 mg by mouth every 6 (six) hours as needed.    Marland Kitchen albuterol (PROAIR HFA) 108 (90 Base) MCG/ACT inhaler Inhale 2 puffs into the lungs every 6 (six) hours as needed.   Marland Kitchen allopurinol (ZYLOPRIM) 100 MG tablet Take 1 tablet (100 mg total) by mouth 2 (two) times daily.   Marland Kitchen atenolol (TENORMIN) 25 MG tablet Take 1 tablet (25 mg total) by mouth 2 (two) times daily. (Patient taking differently: Take 25 mg by mouth daily. )   . atorvastatin (LIPITOR) 20 MG tablet Take 1 tablet (20 mg total) by mouth daily.   . cyclobenzaprine (FLEXERIL) 10 MG tablet Take 1 tablet (10 mg total) by mouth 2 (two) times daily as needed for muscle spasms. (Patient taking differently: Take 10 mg by mouth 2 (two) times daily. )   . diclofenac sodium (VOLTAREN) 1 % GEL Apply 2 g topically 4 (four) times daily.    Marland Kitchen doxycycline (VIBRA-TABS) 100 MG tablet Take 1 tablet (100 mg total) by mouth daily. For acne. 05/01/2018: Prn when experiencing acne flare  . FLUoxetine (PROZAC) 10 MG capsule Take 1 capsule (10 mg  total) by mouth daily.   . Fluticasone-Salmeterol (ADVAIR DISKUS) 250-50 MCG/DOSE AEPB Inhale 1 puff into the lungs 2 (two) times daily. Frequency:BID   Dosage:0.0     Instructions:  Note:Dose: 250-50MCG   . gabapentin (NEURONTIN) 300 MG capsule 300 mg 2 (two) times daily.  05/01/2018: Increasing to 968m BID next week  . glimepiride (AMARYL) 1 MG tablet Take 2 tablets (2 mg total) by mouth daily with supper.   . Glucose Blood (ACCU-CHEK AVIVA PLUS VI) by In Vitro route. Pt needs accu check Aviva plus lancets, test strips and meter to check blood sugars once daily   . glucose blood (ACCU-CHEK AVIVA PLUS) test strip 1 each by Other route as needed for other. Use as instructed   . HYDROcodone-acetaminophen (NORCO) 5-325 MG tablet Take 1 tablet by mouth 2 (two) times daily.    .Marland Kitchenipratropium (ATROVENT) 0.02 % nebulizer solution Use every 6 hours for Shortness of breath and wheezing and as needed.  Prescription is for 90 days   . isosorbide mononitrate (IMDUR) 60 MG 24 hr tablet Take 60 mg by mouth daily.    .Marland Kitchenloratadine (CLARITIN) 10 MG tablet Take 10 mg by mouth daily.    . meloxicam (MOBIC) 7.5 MG tablet Take 1 tab twice a day   . metFORMIN (GLUCOPHAGE) 500 MG tablet Take 1 tablet (500 mg total) by mouth 2 (two) times daily with a meal.   . OXYGEN Inhale 2 L into the lungs continuous.    .Marland Kitchen  traZODone (DESYREL) 100 MG tablet Take 1 tablet (100 mg total) by mouth at bedtime.   Marland Kitchen umeclidinium bromide (INCRUSE ELLIPTA) 62.5 MCG/INH AEPB Inhale 1 puff into the lungs daily.   Marland Kitchen apixaban (ELIQUIS) 5 MG TABS tablet Take 1 tablet (5 mg total) by mouth 2 (two) times daily.   . Blood Glucose Monitoring Suppl (ACCU-CHEK AVIVA PLUS) w/Device KIT Use once daily to check blood sugar.   . busPIRone (BUSPAR) 10 MG tablet Take 1/2 to 1 tablet po bid prn anxiety (Patient not taking: Reported on 04/28/2018)   . hydroxypropyl methylcellulose / hypromellose (ISOPTO TEARS / GONIOVISC) 2.5 % ophthalmic solution Place 1 drop  into both eyes daily as needed for dry eyes.   . nitroGLYCERIN (NITROSTAT) 0.4 MG SL tablet Place 0.4 mg under the tongue. 01/31/2018: Available if needed.   . [DISCONTINUED] albuterol (PROVENTIL HFA;VENTOLIN HFA) 108 (90 Base) MCG/ACT inhaler Inhale 2 puffs into the lungs every 6 (six) hours as needed for wheezing or shortness of breath.   . [DISCONTINUED] traMADol (ULTRAM) 50 MG tablet Take 1 tablet (50 mg total) by mouth 2 (two) times daily as needed. for pain    No facility-administered encounter medications on file as of 05/01/2018.     Functional Status: In your present state of health, do you have any difficulty performing the following activities: 01/31/2018  Hearing? N  Vision? N  Difficulty concentrating or making decisions? N  Walking or climbing stairs? Y  Dressing or bathing? N  Doing errands, shopping? Y  Preparing Food and eating ? N  Using the Toilet? N  In the past six months, have you accidently leaked urine? N  Do you have problems with loss of bowel control? N  Managing your Medications? N  Managing your Finances? Y  Comment daughter assists   Housekeeping or managing your Housekeeping? Y  Some recent data might be hidden    Fall/Depression Screening: Fall Risk  04/22/2018 01/31/2018 01/30/2018  Falls in the past year? Yes Yes Yes  Number falls in past yr: 1 1 1   Injury with Fall? No No No  Risk for fall due to : - - -  Follow up - - -   PHQ 2/9 Scores 04/22/2018 01/31/2018 01/30/2018 01/09/2018 09/30/2017 09/19/2017 04/19/2017  PHQ - 2 Score 0 2 0 0 0 6 2  PHQ- 9 Score - 4 - - - 19 5   Assessment:  Drugs sorted by system:  Neurologic/Psychologic: fluoxetine,  Cardiovascular: apixaban, atenolol, atorvastatin, isosorbiide mononitrate, Nitrostat  Pulmonary/Allergy: albuterol, Advair, loratadine, Incruse,   Endocrine: glimepiride, metformin  Topical: diclofenac gel  Pain: cyclobenzaprine, gabapentin, Norco, meloxicam,   Infectious Diseases: doxycycline (acne),    Miscellaneous: allopurinol  Medications to avoid in the elderly:  Drug interactions: increase in bleeding (fluoxetine and apixaban and meloxicam)  Plan: Filled out application to BMS for Eliquis MAP.   Follow up in 1 week   Ruben Reason, PharmD Clinical Pharmacist, [place] [phone]

## 2018-05-06 NOTE — Progress Notes (Deleted)
Cardiology Office Note  Date:  05/06/2018   ID:  Lawrence Steele, DOB 11-07-1950, MRN [id number]  PCP:  Ronnell Freshwater, NP   No chief complaint on file.   HPI:  Lawrence Steele is a 67 year old gentleman with hx of   COPD,  coronary artery disease, catheterization 2000  50% left circumflex disease diabetes type 2, hemoglobin A1c 9.2 ETOH, vodka long history of smoking, continues to smoke diastolic CHF,  hypertension,  hyperlipidemia,  Morbid obesity Followed at [facility] for a moderately large abdominal aortic aneurysm, dilated iliac arteries. S/p EVAR Ascending aorta <4 cm cough syncope, none in several years  chronic oxygen at night, not on exertion who presents for follow-up of his coronary artery disease.  We received a phone call concerning tachycardia rate up to 140 bpm fromvisiting nurses and physical therapist Recommended he come in the next day, today Reports increased fatigue shortness of breath irritability Some balance issues, walks with a cane  Believes he has had tachycardia for 2 weeks Denies any gross worsening of his leg edema No significant PND orthopnea 2 weeks ago may have got very stressed on the phone,  bounced around on the phone concerning some insurance issues Wonders if miss that had something to do with it  EKG personally reviewed by myself on todays visit Shows suspected atrial flutter with ventricular rate 137 bpm  Other past medical history reviewed  stress test February 2018 No significant ischemia, inferoapical fixed perfusion defect small size  ejection fraction 62% Cardiac catheterization was offered if symptoms got worse  previous falls, low back pain, neuropathy Had MRI lumbar spine with mild degeneration of disks,  Presented to chest pain August 14, 2017  Hospital records reviewed with the patient in detail Notes indicating that he was drinking heavy vodka Diagnosed with alcohol intoxication level was 383  Previous cardiac  catheterization from 2010 reviewed with him showing diffuse mild disease, 50% mid left circumflex, 20% disease in the other vessels   CT ABd 09/2015 at [facility]  completed AAA endovascular stent at [facility] reports -- Stable aortobiiliac endograft with decreased excluded aneurysm size. The previously identified type II endoleak is not well seen on today's study. -- Stable bilateral common iliac artery aneurysms. -- Stable anterior abdominal wall hernia containing nonobstructed small bowel, anterior bladder, and penile prosthesis reservoir.  He is seen by Dr. Humphrey Rolls , pulmonary, did 6 min walk, 94% Only oxygen at night, discussed with him in detail  Was on lipitor for years, just decided to stop this on his own, possibly having stomach issues with Lipitor   previously reported having problems on Crestor , myalgias Myalgias on simvastatin  long history of shortness of breath, on several inhalers  He has been seen at [facility] for a moderately large abdominal aortic aneurysm as well as dilated iliac arteries.   cardiac catheterization 01/18/2009.  Notes indicate he has proximal 20% RCA disease, 10% left main disease, 30 follow by 50% mid left circumflex disease, 20% OM1 disease, 20% OM 2 disease, 20% disease of a ramus branch, 20% mid LAD disease and 20% D1 disease. No intervention was performed at that time He does report a cardiac catheterization prior to that. No further cardiac catheterization or stress testing since 2010  Previously reported chest pain over the past several years. with arm pain, possible numbness in his arm  in the hospital 04/01/2014 for chest pain, shortness of breath. Chest x-ray showed scarring at the left base, lab work reviewed with him showing  negative cardiac enzymes, normal renal function, elevated alcohol level 0.28, BNP 270, EKG with no significant ST or T-wave changes  Records below were researched and found on care anywhere, reviewed with the patient  today Echocardiogram done at [facility] 07/28/2014 showing normal LV systolic function, dilated thoracic aorta 4.0 cm at the sinus of Valsalva, 3.6 cm above the sinotubular junction, LVH otherwise normal study  CT scan of the abdomen details a infrarenal abdominal aortic aneurysm arising at the level of the IMA measuring up to 4.8 x 4.3 cm, iliac artery aneurysm 2.6 on the right, 2.2 on the left  PMH:   has a past medical history of AAA (abdominal aortic aneurysm) ([place]), AAA (abdominal aortic aneurysm) ([place]), Anxiety, Asthma, CHF (congestive heart failure) ([place]), COPD (chronic obstructive pulmonary disease) ([place]), Coronary artery disease, Diabetes mellitus without complication ([place]), ESRD (end stage renal disease) ([place]), Hyperlipidemia, Hypertension, Iliac artery aneurysm, bilateral ([place]), Obstructive sleep apnea-hypopnea syndrome, and Sleep apnea.  PSH:    Past Surgical History:  Procedure Laterality Date  . ABDOMINAL AORTIC ANEURYSM REPAIR  03/06/2015   [place]    . APPENDECTOMY    . CARDIAC CATHETERIZATION  2010,03/16/2003,11/04/2002  . CARDIOVERSION N/A 04/24/2018   Procedure: CARDIOVERSION;  Surgeon: Nelva Bush, MD;  Location: [facility] ORS;  Service: Cardiovascular;  Laterality: N/A;  . CORONARY ANGIOPLASTY  03/16/2003   stent to the RCA  & 2/18/2004stent mid circumflex  . ELBOW SURGERY    . HERNIA REPAIR    . NASAL SINUS SURGERY    . PENILE PROSTHESIS IMPLANT  1995  . PILONIDAL CYST EXCISION    . TEE WITHOUT CARDIOVERSION N/A 04/24/2018   Procedure: TRANSESOPHAGEAL ECHOCARDIOGRAM (TEE);  Surgeon: Nelva Bush, MD;  Location: [facility] ORS;  Service: Cardiovascular;  Laterality: N/A;  . TONSILECTOMY, ADENOIDECTOMY, BILATERAL MYRINGOTOMY AND TUBES    . UMBILICAL HERNIA REPAIR      Current Outpatient Medications  Medication Sig Dispense Refill  . ACCU-CHEK SOFTCLIX LANCETS lancets Use as instructed  Once a daily E11.65 100 each 1  . acetaminophen (TYLENOL) 500 MG tablet  Take 500 mg by mouth every 6 (six) hours as needed.     Marland Kitchen albuterol (PROAIR HFA) 108 (90 Base) MCG/ACT inhaler Inhale 2 puffs into the lungs every 6 (six) hours as needed. 3 Inhaler 3  . allopurinol (ZYLOPRIM) 100 MG tablet Take 1 tablet (100 mg total) by mouth 2 (two) times daily. 180 tablet 1  . apixaban (ELIQUIS) 5 MG TABS tablet Take 1 tablet (5 mg total) by mouth 2 (two) times daily. 60 tablet 3  . atenolol (TENORMIN) 25 MG tablet Take 1 tablet (25 mg total) by mouth 2 (two) times daily. (Patient taking differently: Take 25 mg by mouth daily. ) 60 tablet 11  . atorvastatin (LIPITOR) 20 MG tablet Take 1 tablet (20 mg total) by mouth daily. 30 tablet 11  . Blood Glucose Monitoring Suppl (ACCU-CHEK AVIVA PLUS) w/Device KIT Use once daily to check blood sugar. 1 kit 3  . busPIRone (BUSPAR) 10 MG tablet Take 1/2 to 1 tablet po bid prn anxiety (Patient not taking: Reported on 04/28/2018) 60 tablet 1  . cyclobenzaprine (FLEXERIL) 10 MG tablet Take 1 tablet (10 mg total) by mouth 2 (two) times daily as needed for muscle spasms. (Patient taking differently: Take 10 mg by mouth 2 (two) times daily. ) 60 tablet 2  . diclofenac sodium (VOLTAREN) 1 % GEL Apply 2 g topically 4 (four) times daily.     Marland Kitchen  doxycycline (VIBRA-TABS) 100 MG tablet Take 1 tablet (100 mg total) by mouth daily. For acne. 30 tablet 4  . FLUoxetine (PROZAC) 10 MG capsule Take 1 capsule (10 mg total) by mouth daily. 30 capsule 3  . Fluticasone-Salmeterol (ADVAIR DISKUS) 250-50 MCG/DOSE AEPB Inhale 1 puff into the lungs 2 (two) times daily. Frequency:BID   Dosage:0.0     Instructions:  Note:Dose: 250-50MCG 3 each 3  . gabapentin (NEURONTIN) 300 MG capsule 300 mg 2 (two) times daily.     Marland Kitchen glimepiride (AMARYL) 1 MG tablet Take 2 tablets (2 mg total) by mouth daily with supper. 180 tablet 1  . Glucose Blood (ACCU-CHEK AVIVA PLUS VI) by In Vitro route. Pt needs accu check Aviva plus lancets, test strips and meter to check blood sugars once  daily    . glucose blood (ACCU-CHEK AVIVA PLUS) test strip 1 each by Other route as needed for other. Use as instructed 100 each 3  . HYDROcodone-acetaminophen (NORCO) 5-325 MG tablet Take 1 tablet by mouth 2 (two) times daily.     . hydroxypropyl methylcellulose / hypromellose (ISOPTO TEARS / GONIOVISC) 2.5 % ophthalmic solution Place 1 drop into both eyes daily as needed for dry eyes.    Marland Kitchen ipratropium (ATROVENT) 0.02 % nebulizer solution Use every 6 hours for Shortness of breath and wheezing and as needed.  Prescription is for 90 days 900 mL 1  . isosorbide mononitrate (IMDUR) 60 MG 24 hr tablet Take 60 mg by mouth daily.     Marland Kitchen loratadine (CLARITIN) 10 MG tablet Take 10 mg by mouth daily.     . meloxicam (MOBIC) 7.5 MG tablet Take 1 tab twice a day 60 tablet 3  . metFORMIN (GLUCOPHAGE) 500 MG tablet Take 1 tablet (500 mg total) by mouth 2 (two) times daily with a meal. 180 tablet 1  . nitroGLYCERIN (NITROSTAT) 0.4 MG SL tablet Place 0.4 mg under the tongue.    . OXYGEN Inhale 2 L into the lungs continuous.     . traZODone (DESYREL) 100 MG tablet Take 1 tablet (100 mg total) by mouth at bedtime. 30 tablet 3  . umeclidinium bromide (INCRUSE ELLIPTA) 62.5 MCG/INH AEPB Inhale 1 puff into the lungs daily. 3 each 3   No current facility-administered medications for this visit.      Allergies:   Codeine   Social History:  The patient  reports that he has been smoking cigarettes. He has a 11.25 pack-year smoking history. He has quit using smokeless tobacco.  His smokeless tobacco use included chew. He reports that he drank alcohol. He reports that he does not use drugs.   Family History:   family history includes Depression in his mother; Heart attack (age of onset: 4) in his father; Heart disease in his father; Hyperlipidemia in his father and mother; Hypertension in his father and mother.    Review of Systems: Review of Systems  Constitutional: Negative.   Respiratory: Positive for shortness  of breath.   Cardiovascular: Positive for palpitations.  Gastrointestinal: Negative.   Musculoskeletal: Negative.   Neurological: Negative.   Psychiatric/Behavioral: Negative.   All other systems reviewed and are negative.    PHYSICAL EXAM: VS:  There were no vitals taken for this visit. , BMI There is no height or weight on file to calculate BMI. GEN: Well nourished, well developed, in no acute distress , obese, percent with a cane, gait instability HEENT: normal  Neck: no JVD, carotid bruits, or masses Cardiac: tachycardic, regularr,  no rubs, or gallops,no edema  Respiratory:  moderately decreased breath sounds throughout, scant wheezing , normal work of breathing GI: soft, nontender, nondistended, + BS MS: no deformity or atrophy  Skin: warm and dry, no rash Neuro:  Strength and sensation are intact Psych: euthymic mood, full affect   Recent Labs: 04/11/2018: ALT 15; BUN 12; Creatinine, Ser 0.78; Hemoglobin 14.8; Platelets 145; Potassium 3.7; Sodium 139    Lipid Panel Lab Results  Component Value Date   CHOL 199 10/25/2016   HDL 56 10/25/2016   LDLCALC 120 (H) 10/25/2016   TRIG 114 10/25/2016      Wt Readings from Last 3 Encounters:  04/24/18 260 lb (117.9 kg)  04/22/18 260 lb (117.9 kg)  04/11/18 254 lb (115.2 kg)       ASSESSMENT AND PLAN:  Coronary artery disease involving native coronary artery of native heart with angina pectoris with documented spasm (HCC) - Negative stress test 1 year ago February 2018 Currently with no symptoms of angina. No further workup at this time. Continue current medication regimen.  Atrial flutter Narrow complex tachycardia concerning for flutter Medications Limited by low blood pressure He is relatively asymptomatic Recommended he increase atenolol up to 25 twice a day Suggested he start digoxin 0.25 mg daily Recommended he call us in several days' time with heart rates Long discussion concerning need for cardioversion If  we can adequately rate control, would wait 3 weeks before cardioversion If he becomes more symptomatic may need transesophageal echo with cardioversion Started on eliquis 5 twice a day on today's visit samples provided and coupon for free month  Essential hypertension - Will increase atenolol as above  SOB (shortness of breath) - Severe underlying COPD, coronary artery disease, obesity, deconditioning Recommended regular exercise program He is completing physical therapy  Chest tightness -  Denies any significant symptoms even with heart rate 130 bpm  Stable angina ([place]) Recommended he continue with his Lipitor We will hold his aspirin, start eliquis  Centrilobular emphysema ([place]) Followed by pulmonary, on 3 inhalers Reports symptoms are stable No recent respiratory infections  Morbid obesity (HCC) Chronic back pain, joint pain  Difficulty losing weight, unable to exercise Recommended low carbohydrate diet  Smoker Continues to smoke We have encouraged him to continue to work on weaning his cigarettes and smoking cessation. He will continue to work on this and does not want any assistance with chantix.  Again discussed with him   Total encounter time more than 45 minutes  Greater than 50% was spent in counseling and coordination of care with the patient  Disposition:   F/U  3 weeks He will calls with heart rates in several days' time   No orders of the defined types were placed in this encounter.    Signed, Esmond Plants, M.D., Ph.D. 05/06/2018  [place], [place]

## 2018-05-07 ENCOUNTER — Ambulatory Visit: Payer: Self-pay | Admitting: Cardiovascular Disease

## 2018-05-09 ENCOUNTER — Telehealth: Payer: Self-pay

## 2018-05-09 ENCOUNTER — Other Ambulatory Visit: Payer: Self-pay | Admitting: Nurse Practitioner

## 2018-05-09 DIAGNOSIS — F411 Generalized anxiety disorder: Secondary | ICD-10-CM

## 2018-05-09 MED ORDER — ESCITALOPRAM OXALATE 5 MG PO TABS: 5.0000 mg | ORAL_TABLET | Freq: Every day | ORAL | 3 refills | Status: AC

## 2018-05-09 NOTE — Progress Notes (Signed)
D/c prozac due to negative symptoms. Start excitalopram 5mg  every evening. New rx sent to his pharmacy.

## 2018-05-09 NOTE — Telephone Encounter (Signed)
Pt advised we change prozac to escitalopram to your phar

## 2018-05-09 NOTE — Telephone Encounter (Signed)
D/c prozac due to negative symptoms. Start excitalopram 5mg  every evening. New rx sent to his pharmacy.

## 2018-05-14 ENCOUNTER — Other Ambulatory Visit: Payer: Self-pay

## 2018-05-14 NOTE — Patient Outreach (Signed)
[place] [facility]) Care Management  05/14/2018  VED MARTOS 1950/10/24 [id number]  Care Coordination: RN CM received request from Ina [employer] asking RN CM to verify patients insurance and insurance ID prior to scheduled visit on Tuesday September 3. RN CM able to verify with patient that he remains active with [employer].   Plan: Will provide insurance information to Ms. Jeneen Rinks as requested for verification of [facility] services eligibility  Kent Riendeau E. Rollene Rotunda RN, BSN [facility] Care Management Coordinator [phone].

## 2018-05-20 ENCOUNTER — Other Ambulatory Visit: Payer: Self-pay

## 2018-05-20 NOTE — Patient Outreach (Signed)
[facility]) Care Management   05/20/2018  Lawrence Steele 01-16-1951 [id number]  Lawrence Steele is an 67 y.o. male  Subjective: "I"m doing good from my heart and my diabetes but I they just can't get my back pain controlled". "I go back to [employer] and hopefully they can do something because my pain medicine is running out". "The PT supervisor is coming Thursday or Friday to see how I am doing and if they can continue to come out".  Objective:   BP 110/64 (BP Location: Right Arm, Patient Position: Sitting, Cuff Size: Normal)   Pulse 75   Resp 18   SpO2 96%   Physical Exam  Constitutional: He is oriented to person, place, and time. He appears well-developed and well-nourished.  Cardiovascular: Normal rate, regular rhythm, normal heart sounds and intact distal pulses.  Respiratory: He is in respiratory distress. He has wheezes.  Course rhonchi throughout. Mildly clears with cough. Patient has not used nebulizer/inhaler this am.  Musculoskeletal: He exhibits edema.  Decreased ROM right hip. Mild 2+ pitting edema noted to lower legs, R>L  Neurological: He is alert and oriented to person, place, and time.  Skin: Skin is warm and dry.  Psychiatric: He has a normal mood and affect. His behavior is normal. Judgment and thought content normal.    Encounter Medications:   Outpatient Encounter Medications as of 05/20/2018  Medication Sig Note  . ACCU-CHEK SOFTCLIX LANCETS lancets Use as instructed  Once a daily E11.65   . acetaminophen (TYLENOL) 500 MG tablet Take 500 mg by mouth every 6 (six) hours as needed.    Marland Kitchen albuterol (PROAIR HFA) 108 (90 Base) MCG/ACT inhaler Inhale 2 puffs into the lungs every 6 (six) hours as needed.   Marland Kitchen allopurinol (ZYLOPRIM) 100 MG tablet Take 1 tablet (100 mg total) by mouth 2 (two) times daily.   Marland Kitchen apixaban (ELIQUIS) 5 MG TABS tablet Take 1 tablet (5 mg total) by mouth 2 (two) times daily.   Marland Kitchen atenolol (TENORMIN) 25 MG tablet Take 1  tablet (25 mg total) by mouth 2 (two) times daily. (Patient taking differently: Take 25 mg by mouth daily. ) 05/20/2018: Patient states he is taking 2 tablet in the morning (50 mg total) daily  . atorvastatin (LIPITOR) 20 MG tablet Take 1 tablet (20 mg total) by mouth daily.   . Blood Glucose Monitoring Suppl (ACCU-CHEK AVIVA PLUS) w/Device KIT Use once daily to check blood sugar.   . cyclobenzaprine (FLEXERIL) 10 MG tablet Take 1 tablet (10 mg total) by mouth 2 (two) times daily as needed for muscle spasms. (Patient taking differently: Take 10 mg by mouth 2 (two) times daily. )   . Fluticasone-Salmeterol (ADVAIR DISKUS) 250-50 MCG/DOSE AEPB Inhale 1 puff into the lungs 2 (two) times daily. Frequency:BID   Dosage:0.0     Instructions:  Note:Dose: 250-50MCG   . gabapentin (NEURONTIN) 300 MG capsule 300 mg 2 (two) times daily.  05/20/2018: Patient taking differently-1 tablet in the am, one at noon, one in the pm  . glimepiride (AMARYL) 1 MG tablet Take 2 tablets (2 mg total) by mouth daily with supper.   . Glucose Blood (ACCU-CHEK AVIVA PLUS VI) by In Vitro route. Pt needs accu check Aviva plus lancets, test strips and meter to check blood sugars once daily   . glucose blood (ACCU-CHEK AVIVA PLUS) test strip 1 each by Other route as needed for other. Use as instructed   . HYDROcodone-acetaminophen (NORCO) 5-325 MG tablet  Take 1 tablet by mouth 2 (two) times daily.    . hydroxypropyl methylcellulose / hypromellose (ISOPTO TEARS / GONIOVISC) 2.5 % ophthalmic solution Place 1 drop into both eyes daily as needed for dry eyes.   Marland Kitchen ipratropium (ATROVENT) 0.02 % nebulizer solution Use every 6 hours for Shortness of breath and wheezing and as needed.  Prescription is for 90 days   . isosorbide mononitrate (IMDUR) 60 MG 24 hr tablet Take 60 mg by mouth daily.    Marland Kitchen loratadine (CLARITIN) 10 MG tablet Take 10 mg by mouth daily.    . metFORMIN (GLUCOPHAGE) 500 MG tablet Take 1 tablet (500 mg total) by mouth 2 (two) times  daily with a meal.   . traZODone (DESYREL) 100 MG tablet Take 1 tablet (100 mg total) by mouth at bedtime.   Marland Kitchen umeclidinium bromide (INCRUSE ELLIPTA) 62.5 MCG/INH AEPB Inhale 1 puff into the lungs daily.   . busPIRone (BUSPAR) 10 MG tablet Take 1/2 to 1 tablet po bid prn anxiety (Patient not taking: Reported on 04/28/2018)   . diclofenac sodium (VOLTAREN) 1 % GEL Apply 2 g topically 4 (four) times daily.    Marland Kitchen doxycycline (VIBRA-TABS) 100 MG tablet Take 1 tablet (100 mg total) by mouth daily. For acne. (Patient not taking: Reported on 05/20/2018) 05/01/2018: Prn when experiencing acne flare  . escitalopram (LEXAPRO) 5 MG tablet Take 1 tablet (5 mg total) by mouth daily. (Patient not taking: Reported on 05/20/2018)   . nitroGLYCERIN (NITROSTAT) 0.4 MG SL tablet Place 0.4 mg under the tongue. 01/31/2018: Available if needed.   . OXYGEN Inhale 2 L into the lungs continuous.     No facility-administered encounter medications on file as of 05/20/2018.     Functional Status:   In your present state of health, do you have any difficulty performing the following activities: 01/31/2018  Hearing? N  Vision? N  Difficulty concentrating or making decisions? N  Walking or climbing stairs? Y  Dressing or bathing? N  Doing errands, shopping? Y  Preparing Food and eating ? N  Using the Toilet? N  In the past six months, have you accidently leaked urine? N  Do you have problems with loss of bowel control? N  Managing your Medications? N  Managing your Finances? Y  Comment daughter assists   Housekeeping or managing your Housekeeping? Y  Some recent data might be hidden    Fall/Depression Screening:    Fall Risk  04/22/2018 01/31/2018 01/30/2018  Falls in the past year? Yes Yes Yes  Number falls in past yr: 1 1 1   Injury with Fall? No No No  Risk for fall due to : - - -  Follow up - - -   PHQ 2/9 Scores 04/22/2018 01/31/2018 01/30/2018 01/09/2018 09/30/2017 09/19/2017 04/19/2017  PHQ - 2 Score 0 2 0 0 0 6 2  PHQ- 9  Score - 4 - - - 19 5    Assessment:  Patient appears well. Alert and oriented. C/O back pain. Sleepy from pain medication. Engaged with RN CM and [employer] nursing student during entire visit. Receptive to review of education given while assigned to RN CM. Care Plan goals met. Patient verbalized understanding of ongoing need to follow changes established during previous education related to monitoring CBG, diabetic diet, and HR monitoring for new/recurring onset of Aflutter with RVR. Patient appreciative for all services provided by [facility]. Patient consents to transfer of services to [facility]    Patient continues to smoke. Not  interested in smoking cessation counseling at this time.  Plan: RN CM will close current program, send dicipline closure letter to PCP and transfer patient with his permission to [facility].  [facility] CM Care Plan Problem One     Most Recent Value  Care Plan Problem One  Diabetes-self care  Role Documenting the Problem One  Care Management Coordinator  Care Plan for Problem One  Active  [facility] Long Term Goal   Patient will not see an increase in his A1C over the next 60 days  [facility] Long Term Goal Start Date  05/20/18  [facility] Long Term Goal Met Date  04/28/18  Interventions for Problem One Long Term Goal  reviewed importance of maintaining a healthy diabetic diet and maintaining the changes patient has made since [facility] CM has been involved with patient, discussed importance in maintaining good communication with PCP and keeping all scheduled appointments  [facility] CM Short Term Goal #1   Pt would obtain his own glucometer, continue to check sugars within the next 30 days  [Addendum- late entry for short term goal #1 ]  [facility] CM Short Term Goal #1 Start Date  01/31/18  [facility] CM Short Term Goal #1 Met Date  02/26/18  [facility] CM Short Term Goal #2   Pt would have better understanding of diabetic diet within the next 30 days  [Addendum: late entry for short term goal #2 for 5/17 h/v]  [facility] CM Short  Term Goal #2 Start Date  01/31/18  [facility] CM Short Term Goal #2 Met Date  02/26/18  [facility] CM Short Term Goal #3  patient will transition from loaner glucometer to his new glucometer over the next 30 days  [facility] CM Short Term Goal #3 Start Date  03/10/18    [facility] CM Care Plan Problem Two     Most Recent Value  Care Plan Problem Two  chronic back pain-self care  Role Documenting the Problem Two  Care Management Coordinator  Care Plan for Problem Two  Not Active  [facility] Long Term Goal  patient will be able to perform ADLs with less pain over the next 31 days  THN Long Term Goal Start Date  04/17/18 [date recet]  [facility] CM Short Term Goal #1   patient will communicate effectively with MD at next appointment effects of current pain on QOL  [facility] CM Short Term Goal #1 Start Date  03/10/18  [facility] CM Short Term Goal #1 Met Date   04/08/18    [facility] CM Care Plan Problem Three     Most Recent Value  Care Plan Problem Three  high risk for hospitalization related to new onset of Aflutter with RVR  Role Documenting the Problem Three  Care Management [place] for Problem Three  Active  [facility] Long Term Goal   patient will not have acute hospital admission related to elevated HR over the next 31 days  [facility]N Long Term Goal Start Date  04/17/18  [facility] Long Term Goal Met Date  05/20/18  [facility] CM Short Term Goal #1   patient will take all medications as prescribed over the next 30 days  [facility] CM Short Term Goal #1 Start Date  04/17/18  [facility] CM Short Term Goal #1 Met Date  05/20/18  [facility] CM Short Term Goal #2   patient will decrease amount of coffee intake from 6 cups daily to 2 cups daily over the next  [facility] CM Short Term Goal #2 Start Date  04/17/18  [facility] CM Short Term Goal #2 Met Date  05/20/18     Sadiq Mccauley E. Rollene Rotunda RN, BSN [facility] Care Management Coordinator [phone]

## 2018-05-22 ENCOUNTER — Encounter: Payer: Self-pay | Admitting: *Deleted

## 2018-05-22 NOTE — Progress Notes (Signed)
Cardiology Office Note  Date:  05/23/2018   ID:  ZYAIR RUSSI, DOB [birth year], MRN [id number]  PCP:  Ronnell Freshwater, NP   Chief Complaint  Patient presents with  . OTHER    F/u hospital due to a-flutter pt c/o chest pain and right leg swelling. Discuss options Eliquis is too Expensive for pt to afford. Meds reviewed verbally with pt.    HPI:  Mr. Seal is a 67 year old gentleman with hx of   COPD,  coronary artery disease, catheterization 2000  50% left circumflex disease diabetes type 2, hemoglobin A1c 9.2 ETOH, vodka long history of smoking, continues to smoke diastolic CHF,  hypertension,  hyperlipidemia,  Morbid obesity Followed at [facility] for a moderately large abdominal aortic aneurysm, dilated iliac arteries. S/p EVAR Ascending aorta <4 cm cough syncope, none in several years  chronic oxygen at night, not on exertion who presents for follow-up of his coronary artery disease and atrial flutter  On his last clinic visit was in atrial flutter rate 137 bpm He underwent Transesophageal echo followed by Cardioversion on 04/24/2018 for atrial flutter n follow-up he reports having: Better energy Heart rate better, oxygen better, on 2 L, no desaturations Less irritable  Rare shooting pain in his left chest Comes and goes very quickly not related toexertion  HBA1c 6.4, down from 9.2 Made a radical change in his diet  Some balance issues, walks with a cane Working with PT, still wobbly  EKG personally reviewed by myself on todays visit Shows NSR rate 78 bpm no ST or T wave change  Other past medical history reviewed  stress test February 2018 No significant ischemia, inferoapical fixed perfusion defect small size  ejection fraction 62% Cardiac catheterization was offered if symptoms got worse  previous falls, low back pain, neuropathy Had MRI lumbar spine with mild degeneration of disks,  Presented to chest pain August 14, 2017  Hospital records reviewed  with the patient in detail Notes indicating that he was drinking heavy vodka Diagnosed with alcohol intoxication level was 383  Previous cardiac catheterization from 2010 reviewed with him showing diffuse mild disease, 50% mid left circumflex, 20% disease in the other vessels   CT [facility] 09/2015 at [facility]  completed AAA endovascular stent at [facility] reports -- Stable aortobiiliac endograft with decreased excluded aneurysm size. The previously identified type II endoleak is not well seen on today's study. -- Stable bilateral common iliac artery aneurysms. -- Stable anterior abdominal wall hernia containing nonobstructed small bowel, anterior bladder, and penile prosthesis reservoir.  He is seen by Dr. Humphrey Rolls , pulmonary, did 6 min walk, 94% Only oxygen at night, discussed with him in detail  Was on lipitor for years, just decided to stop this on his own, possibly having stomach issues with Lipitor   previously reported having problems on Crestor , myalgias Myalgias on simvastatin  long history of shortness of breath, on several inhalers  He has been seen at [facility] for a moderately large abdominal aortic aneurysm as well as dilated iliac arteries.   cardiac catheterization 01/18/2009.  Notes indicate he has proximal 20% RCA disease, 10% left main disease, 30 follow by 50% mid left circumflex disease, 20% OM1 disease, 20% OM 2 disease, 20% disease of a ramus branch, 20% mid LAD disease and 20% D1 disease. No intervention was performed at that time He does report a cardiac catheterization prior to that. No further cardiac catheterization or stress testing since 2010  Previously reported chest pain over the past  several years. with arm pain, possible numbness in his arm  in the hospital 04/01/2014 for chest pain, shortness of breath. Chest x-ray showed scarring at the left base, lab work reviewed with him showing negative cardiac enzymes, normal renal function, elevated alcohol level 0.28, BNP 270,  EKG with no significant ST or T-wave changes  Records below were researched and found on care anywhere, reviewed with the patient today Echocardiogram done at [facility] 07/28/2014 showing normal LV systolic function, dilated thoracic aorta 4.0 cm at the sinus of Valsalva, 3.6 cm above the sinotubular junction, LVH otherwise normal study  CT scan of the abdomen details a infrarenal abdominal aortic aneurysm arising at the level of the IMA measuring up to 4.8 x 4.3 cm, iliac artery aneurysm 2.6 on the right, 2.2 on the left  PMH:   has a past medical history of AAA (abdominal aortic aneurysm) ([place]), AAA (abdominal aortic aneurysm) ([place]), Anxiety, Asthma, CHF (congestive heart failure) ([place]), COPD (chronic obstructive pulmonary disease) ([place]), Coronary artery disease, Diabetes mellitus without complication ([place]), ESRD (end stage renal disease) ([place]), Hyperlipidemia, Hypertension, Iliac artery aneurysm, bilateral ([place]), Obstructive sleep apnea-hypopnea syndrome, and Sleep apnea.  PSH:    Past Surgical History:  Procedure Laterality Date  . ABDOMINAL AORTIC ANEURYSM REPAIR  03/06/2015   [place]    . APPENDECTOMY    . CARDIAC CATHETERIZATION  2010,03/16/2003,11/04/2002  . CARDIOVERSION N/A 04/24/2018   Procedure: CARDIOVERSION;  Surgeon: Nelva Bush, MD;  Location: [facility] ORS;  Service: Cardiovascular;  Laterality: N/A;  . CORONARY ANGIOPLASTY  03/16/2003   stent to the RCA  & 2/18/2004stent mid circumflex  . ELBOW SURGERY    . HERNIA REPAIR    . NASAL SINUS SURGERY    . PENILE PROSTHESIS IMPLANT  1995  . PILONIDAL CYST EXCISION    . TEE WITHOUT CARDIOVERSION N/A 04/24/2018   Procedure: TRANSESOPHAGEAL ECHOCARDIOGRAM (TEE);  Surgeon: Nelva Bush, MD;  Location: [facility] ORS;  Service: Cardiovascular;  Laterality: N/A;  . TONSILECTOMY, ADENOIDECTOMY, BILATERAL MYRINGOTOMY AND TUBES    . UMBILICAL HERNIA REPAIR      Current Outpatient Medications  Medication Sig Dispense  Refill  . ACCU-CHEK SOFTCLIX LANCETS lancets Use as instructed  Once a daily E11.65 100 each 1  . acetaminophen (TYLENOL) 500 MG tablet Take 500 mg by mouth every 6 (six) hours as needed.     Marland Kitchen albuterol (PROAIR HFA) 108 (90 Base) MCG/ACT inhaler Inhale 2 puffs into the lungs every 6 (six) hours as needed. 3 Inhaler 3  . allopurinol (ZYLOPRIM) 100 MG tablet Take 1 tablet (100 mg total) by mouth 2 (two) times daily. 180 tablet 1  . apixaban (ELIQUIS) 5 MG TABS tablet Take 1 tablet (5 mg total) by mouth 2 (two) times daily. 60 tablet 3  . atenolol (TENORMIN) 25 MG tablet Take 1 tablet (25 mg total) by mouth 2 (two) times daily. (Patient taking differently: Take 25 mg by mouth daily. ) 60 tablet 11  . atorvastatin (LIPITOR) 20 MG tablet Take 1 tablet (20 mg total) by mouth daily. 30 tablet 11  . Blood Glucose Monitoring Suppl (ACCU-CHEK AVIVA PLUS) w/Device KIT Use once daily to check blood sugar. 1 kit 3  . cyclobenzaprine (FLEXERIL) 10 MG tablet Take 1 tablet (10 mg total) by mouth 2 (two) times daily as needed for muscle spasms. (Patient taking differently: Take 10 mg by mouth 2 (two) times daily. ) 60 tablet 2  . diclofenac sodium (VOLTAREN) 1 % GEL Apply  2 g topically 4 (four) times daily.     Marland Kitchen doxycycline (VIBRA-TABS) 100 MG tablet Take 1 tablet (100 mg total) by mouth daily. For acne. 30 tablet 4  . escitalopram (LEXAPRO) 5 MG tablet Take 1 tablet (5 mg total) by mouth daily. 30 tablet 3  . Fluticasone-Salmeterol (ADVAIR DISKUS) 250-50 MCG/DOSE AEPB Inhale 1 puff into the lungs 2 (two) times daily. Frequency:BID   Dosage:0.0     Instructions:  Note:Dose: 250-50MCG 3 each 3  . gabapentin (NEURONTIN) 300 MG capsule 300 mg 2 (two) times daily.     Marland Kitchen glimepiride (AMARYL) 1 MG tablet Take 2 tablets (2 mg total) by mouth daily with supper. 180 tablet 1  . Glucose Blood (ACCU-CHEK AVIVA PLUS VI) by In Vitro route. Pt needs accu check Aviva plus lancets, test strips and meter to check blood sugars once  daily    . glucose blood (ACCU-CHEK AVIVA PLUS) test strip 1 each by Other route as needed for other. Use as instructed 100 each 3  . HYDROcodone-acetaminophen (NORCO) 5-325 MG tablet Take 1 tablet by mouth 2 (two) times daily.     . hydroxypropyl methylcellulose / hypromellose (ISOPTO TEARS / GONIOVISC) 2.5 % ophthalmic solution Place 1 drop into both eyes daily as needed for dry eyes.    Marland Kitchen ipratropium (ATROVENT) 0.02 % nebulizer solution Use every 6 hours for Shortness of breath and wheezing and as needed.  Prescription is for 90 days 900 mL 1  . isosorbide mononitrate (IMDUR) 60 MG 24 hr tablet Take 60 mg by mouth daily.     Marland Kitchen loratadine (CLARITIN) 10 MG tablet Take 10 mg by mouth daily.     . metFORMIN (GLUCOPHAGE) 500 MG tablet Take 1 tablet (500 mg total) by mouth 2 (two) times daily with a meal. 180 tablet 1  . nitroGLYCERIN (NITROSTAT) 0.4 MG SL tablet Place 0.4 mg under the tongue.    . OXYGEN Inhale 2 L into the lungs continuous.     . traZODone (DESYREL) 100 MG tablet Take 1 tablet (100 mg total) by mouth at bedtime. 30 tablet 3  . umeclidinium bromide (INCRUSE ELLIPTA) 62.5 MCG/INH AEPB Inhale 1 puff into the lungs daily. 3 each 3   No current facility-administered medications for this visit.      Allergies:   Codeine   Social History:  The patient  reports that he has been smoking cigarettes. He has a 11.25 pack-year smoking history. He has quit using smokeless tobacco.  His smokeless tobacco use included chew. He reports that he drank alcohol. He reports that he does not use drugs.   Family History:   family history includes Depression in his mother; Heart attack (age of onset: 33) in his father; Heart disease in his father; Hyperlipidemia in his father and mother; Hypertension in his father and mother.    Review of Systems: Review of Systems  Constitutional: Negative.   Respiratory: Positive for shortness of breath.   Cardiovascular: Negative.   Gastrointestinal: Negative.    Musculoskeletal: Negative.        Gait instability  Neurological: Negative.   Psychiatric/Behavioral: Negative.   All other systems reviewed and are negative.    PHYSICAL EXAM: VS:  BP 124/70 (BP Location: Left Arm, Patient Position: Sitting, Cuff Size: Normal)   Pulse 78   Ht 5' 10"  (1.778 m)   Wt 262 lb (118.8 kg)   BMI 37.59 kg/m  , BMI Body mass index is 37.59 kg/m. Constitutional:  oriented to  person, place, and time. No distress. On oxygen, some gait instability getting off the table and walking HENT:  Head: Normocephalic and atraumatic.  Eyes:  no discharge. No scleral icterus.  Neck: Normal range of motion. Neck supple. No JVD present.  Cardiovascular: Normal rate, regular rhythm, normal heart sounds and intact distal pulses. Exam reveals no gallop and no friction rub. No edema No murmur heard. Pulmonary/Chest: mildly decreased breath sounds bilaterally,  No stridor. No respiratory distress.  no wheezes.  no rales.  no tenderness.  Abdominal: Soft.  no distension.  no tenderness.  Musculoskeletal: Normal range of motion.  no  tenderness or deformity.  Neurological:  normal muscle tone. Coordination normal. No atrophy Skin: Skin is warm and dry. No rash noted. not diaphoretic.  Psychiatric:  normal mood and affect. behavior is normal. Thought content normal.    Recent Labs: 04/11/2018: ALT 15; BUN 12; Creatinine, Ser 0.78; Hemoglobin 14.8; Platelets 145; Potassium 3.7; Sodium 139    Lipid Panel Lab Results  Component Value Date   CHOL 199 10/25/2016   HDL 56 10/25/2016   LDLCALC 120 (H) 10/25/2016   TRIG 114 10/25/2016      Wt Readings from Last 3 Encounters:  05/23/18 262 lb (118.8 kg)  04/24/18 260 lb (117.9 kg)  04/22/18 260 lb (117.9 kg)     ASSESSMENT AND PLAN:  Coronary artery disease involving native coronary artery of native heart with angina pectoris with documented spasm (HCC) - Negative stress test 1 year ago February 2018 Currently with no  symptoms of angina. No further workup at this time. Continue current medication regimen.having some rare atypical pain. No further workup. Likely muscle skeletal  Atrial flutter recent transesophageal echo with cardioversion which was successful on eliquis 5 twice a day  Samples provided Stay on atenolol 50 daily  Essential hypertension - Stay on atenolol Blood pressure stable  SOB (shortness of breath) - Severe underlying COPD, coronary artery disease, obesity, deconditioning Recommended regular exercise program He is completing physical therapy. Overall feeling better in normal sinus rhythm  Stable angina (HCC)  continue with his Lipitor Stay on eliquis  Centrilobular emphysema ([place]) Followed by pulmonary, on 3 inhalers No recent respiratory infections. Overall stable  Morbid obesity (HCC) Chronic back pain, joint pain  Difficulty losing weight, unable to exercise He has lost weight and A1c dramatically improved  Smoker We have encouraged him to continue to work on weaning his cigarettes and smoking cessation. He will continue to work on this and does not want any assistance with chantix.    Total encounter time more than 25 minutes  Greater than 50% was spent in counseling and coordination of care with the patient  Disposition:   F/U  12 months   Orders Placed This Encounter  Procedures  . EKG 12-Lead     Signed, Esmond Plants, M.D., Ph.D. 05/23/2018  [place], [place]

## 2018-05-23 ENCOUNTER — Encounter: Payer: Self-pay | Admitting: Cardiovascular Disease

## 2018-05-23 ENCOUNTER — Ambulatory Visit (INDEPENDENT_AMBULATORY_CARE_PROVIDER_SITE_OTHER): Payer: Medicare HMO | Admitting: Cardiovascular Disease

## 2018-05-23 VITALS — BP 124/70 | HR 78 | Ht 70.0 in | Wt 262.0 lb

## 2018-05-23 DIAGNOSIS — I739 Peripheral vascular disease, unspecified: Secondary | ICD-10-CM

## 2018-05-23 DIAGNOSIS — I483 Typical atrial flutter: Secondary | ICD-10-CM

## 2018-05-23 DIAGNOSIS — F17200 Nicotine dependence, unspecified, uncomplicated: Secondary | ICD-10-CM

## 2018-05-23 DIAGNOSIS — I723 Aneurysm of iliac artery: Secondary | ICD-10-CM

## 2018-05-23 DIAGNOSIS — I714 Abdominal aortic aneurysm, without rupture, unspecified: Secondary | ICD-10-CM

## 2018-05-23 DIAGNOSIS — R0602 Shortness of breath: Secondary | ICD-10-CM

## 2018-05-23 DIAGNOSIS — I10 Essential (primary) hypertension: Secondary | ICD-10-CM

## 2018-05-23 DIAGNOSIS — J432 Centrilobular emphysema: Secondary | ICD-10-CM

## 2018-05-23 DIAGNOSIS — I25111 Atherosclerotic heart disease of native coronary artery with angina pectoris with documented spasm: Secondary | ICD-10-CM | POA: Diagnosis not present

## 2018-05-23 MED ORDER — ATENOLOL 50 MG PO TABS: 50.0000 mg | ORAL_TABLET | Freq: Every day | ORAL | 3 refills | Status: DC

## 2018-05-23 NOTE — Patient Instructions (Addendum)
Samples of eliquis Medication Samples have been provided to the patient.  Drug name: Eliquis       Strength: 5 mg        Qty: 2 boxes  LOT: [id number]  Exp.Date: 11/21   Medication Instructions:   No medication changes made  Labwork:  No new labs needed  Testing/Procedures:  No further testing at this time   Follow-Up: It was a pleasure seeing you in the office today. Please call us if you have new issues that need to be addressed before your next appt.  [phone]  Your physician wants you to follow-up in: 12 months.  You will receive a reminder letter in the mail two months in advance. If you don't receive a letter, please call our office to schedule the follow-up appointment.  If you need a refill on your cardiac medications before your next appointment, please call your pharmacy.  For educational health videos Log in to : [email or web address] Or : [email or web address], password : [email or web address]

## 2018-05-26 ENCOUNTER — Telehealth: Payer: Self-pay | Admitting: Cardiovascular Disease

## 2018-05-26 ENCOUNTER — Other Ambulatory Visit: Payer: Self-pay | Admitting: Pharmacist

## 2018-05-26 NOTE — Patient Outreach (Signed)
[facility]) Care Management  05/26/2018  TRAQUAN DUARTE 1950-10-21 [id number]    Placed call to [facility] (Dr. Ida Rogue) to confirm receipt of provider portion of Eliquis MAP ([employer]) faxed on 05/01/2018. Dr. Rockey Situ provided Mr. Rufino with Eliquis samples at office visit 05/23/2018.   Ruben Reason, PharmD Clinical Pharmacist, [place] [facility] [phone]

## 2018-05-26 NOTE — Telephone Encounter (Signed)
Left voicemail message.

## 2018-05-26 NOTE — Telephone Encounter (Signed)
Julile with [facility] calling stating she's been helping patient with Patient assistant forms for his Eliquis  She states they sent it over to [place] around 05/01/18 She is just checking in to see the status on this She is asking if we could please fax those paperwork back so patient can get medication   Please advise

## 2018-05-26 NOTE — Telephone Encounter (Signed)
Please review regarding the patient assistance forms.

## 2018-05-29 ENCOUNTER — Other Ambulatory Visit: Payer: Self-pay | Admitting: Pharmacy Technician

## 2018-05-29 NOTE — Patient Outreach (Signed)
[facility]) Care Management  05/29/2018  Lawrence Steele 20-Sep-1950 [id number]   Received provider portion of Eliquis patient assistance application. Faxed completed application and required documents into [employer].  Will follow up with company in 7-10 business days to check status of application.  [name], [place] [facility] [phone]

## 2018-05-30 ENCOUNTER — Encounter: Payer: Self-pay | Admitting: Cardiovascular Disease

## 2018-05-30 NOTE — Progress Notes (Signed)
Fax received from [place] on 05/29/18 that the patient's application for assistance for Eliquis has been received and is under review. We should be notified in 2 business days if he is approved.  Application case #: [id number]

## 2018-06-02 NOTE — Telephone Encounter (Signed)
Paperwork faxed back to [facility] office after physician signed.

## 2018-06-03 ENCOUNTER — Other Ambulatory Visit: Payer: Self-pay

## 2018-06-03 MED ORDER — METFORMIN HCL 500 MG PO TABS: 500.0000 mg | ORAL_TABLET | Freq: Two times a day (BID) | ORAL | 1 refills | Status: DC

## 2018-06-03 MED ORDER — ISOSORBIDE MONONITRATE ER 60 MG PO TB24: 60.0000 mg | ORAL_TABLET | Freq: Every day | ORAL | 1 refills | Status: DC

## 2018-06-03 NOTE — Telephone Encounter (Signed)
Received Fax from [employer] for patient's Eliquis.  Eliquis has been approved and free of charge from 06/03/2018 through 09/16/2018.   Spoke with Claris Pong from [employer] to verify patients date of birth due to it not being listed on the approval application. She stated to contact the patient and make sure they call Thursday (06/05/2018) afternoon to the speciality pharmacy [facility] at [phone] to conform the first shipment.   If patient does not call they will not ship the medication.   Called patient and make him aware of all of these details.

## 2018-06-04 ENCOUNTER — Other Ambulatory Visit: Payer: Self-pay | Admitting: Pharmacy Technician

## 2018-06-04 ENCOUNTER — Other Ambulatory Visit: Payer: Self-pay | Admitting: Pharmacist

## 2018-06-04 NOTE — Patient Outreach (Signed)
[facility]) Care Management  06/04/2018  Lawrence Steele January 13, 1951 [id number]   Follow up call to [name] to check status of patients application for Eliquis. [name] confirmed that patient has been approved as of 06/03/18 until 09/16/18. Patient will need to call in and set up account prior to 1st shipment going out.   Will follow up with patient in 2-3 business days to confirm he has been informed and that he has set his account up for shipment.  [name], [place] [facility] [phone]

## 2018-06-04 NOTE — Patient Outreach (Signed)
[facility]) Care Management  06/04/2018  [name] Apr 05, 1951 [id number]  Medication Assistance: Patient is has been approved for Eliquis made by [place] and prescribed by Dr. Ida Rogue. Approved 06/03/18 and expires 09/16/18.   Follow up: Patient should receive phone call from [employer] to set up patient account to receive medication shipments. Will follow up with patient in 10 business days to ensure he has received medication.   Ruben Reason, PharmD Clinical Pharmacist, [place] [facility] [phone]

## 2018-06-09 ENCOUNTER — Other Ambulatory Visit: Payer: Self-pay

## 2018-06-09 ENCOUNTER — Ambulatory Visit: Payer: Self-pay | Admitting: Internal Medicine

## 2018-06-09 MED ORDER — ISOSORBIDE MONONITRATE ER 60 MG PO TB24: 60.0000 mg | ORAL_TABLET | Freq: Every day | ORAL | 1 refills | Status: DC

## 2018-06-09 MED ORDER — METFORMIN HCL 500 MG PO TABS: 500.0000 mg | ORAL_TABLET | Freq: Two times a day (BID) | ORAL | 1 refills | Status: DC

## 2018-06-13 ENCOUNTER — Other Ambulatory Visit: Payer: Self-pay | Admitting: Pharmacist

## 2018-06-13 NOTE — Patient Outreach (Signed)
  [facility]) Care Management  06/13/2018  Lawrence Steele 27-Jul-1951 [id number]  67 y.o. year old male referred to [place] for medication assistance (Eliquis).   [facility] pharmacy case is being closed due to the following reasons:  Goals have been met. Patient has been approved for Eliquis MAP and has received medication.   Patient has been provided [facility] CM contact information if assistance needed in the future.  I will remove myself and [name] from the care team and close the pharmacy episode.   Ruben Reason, PharmD Clinical Pharmacist, [place] [facility] [phone]

## 2018-06-18 ENCOUNTER — Other Ambulatory Visit: Payer: Self-pay | Admitting: *Deleted

## 2018-06-18 NOTE — Patient Outreach (Signed)
[facility]) Care Management  06/18/2018  Lawrence Steele 1951-05-31 [id number]   RN Health Coach attempted #1 follow up outreach call to patient.  Patient was unavailable. HIPPA compliance voicemail message left with return callback number.  Plan: RN will call patient again within 10 business days.  [place] Care Management [phone]

## 2018-06-19 ENCOUNTER — Encounter: Payer: Self-pay | Admitting: Internal Medicine

## 2018-06-19 ENCOUNTER — Ambulatory Visit: Payer: Medicare HMO | Admitting: Internal Medicine

## 2018-06-19 VITALS — BP 112/80 | HR 47 | Resp 16 | Ht 70.0 in | Wt 263.4 lb

## 2018-06-19 DIAGNOSIS — R0602 Shortness of breath: Secondary | ICD-10-CM

## 2018-06-19 DIAGNOSIS — G4733 Obstructive sleep apnea (adult) (pediatric): Secondary | ICD-10-CM | POA: Diagnosis not present

## 2018-06-19 DIAGNOSIS — J9611 Chronic respiratory failure with hypoxia: Secondary | ICD-10-CM

## 2018-06-19 DIAGNOSIS — I482 Chronic atrial fibrillation, unspecified: Secondary | ICD-10-CM

## 2018-06-19 DIAGNOSIS — F17219 Nicotine dependence, cigarettes, with unspecified nicotine-induced disorders: Secondary | ICD-10-CM

## 2018-06-19 DIAGNOSIS — Z9989 Dependence on other enabling machines and devices: Secondary | ICD-10-CM

## 2018-06-19 DIAGNOSIS — J0110 Acute frontal sinusitis, unspecified: Secondary | ICD-10-CM

## 2018-06-19 DIAGNOSIS — Z23 Encounter for immunization: Secondary | ICD-10-CM | POA: Diagnosis not present

## 2018-06-19 MED ORDER — AMOXICILLIN-POT CLAVULANATE 875-125 MG PO TABS: 1.0000 | ORAL_TABLET | Freq: Two times a day (BID) | ORAL | 0 refills | Status: DC

## 2018-06-19 NOTE — Progress Notes (Signed)
[facility] [place],  [address]  Pulmonary Sleep Medicine   Office Visit Note  Patient Name: Lawrence Steele DOB: 06-23-1951 MRN [id number]  Date of Service: 06/19/2018  Complaints/HPI: Pt her for follow up for copd, OSA, and chronic respiration failure on oxygen.  He reports today that he is having sinus drainage, PND, and cough. He reports he had a cardioversion a few months ago and denies issues with his heart rate since then.  He reports wearing his CPAP with oxygen every night. He reports the CPAP resolves any symptoms he was having. He denies fever, or chest pain.  Denies recent hospitalizations.   ROS  General: (-) fever, (-) chills, (-) night sweats, (-) weakness Skin: (-) rashes, (-) itching,. Eyes: (-) visual changes, (-) redness, (-) itching. Nose and Sinuses: (-) nasal stuffiness or itchiness, (-) postnasal drip, (-) nosebleeds, (-) sinus trouble. Mouth and Throat: (-) sore throat, (-) hoarseness. Neck: (-) swollen glands, (-) enlarged thyroid, (-) neck pain. Respiratory: + cough, (-) bloody sputum, + shortness of breath, - wheezing. Cardiovascular: - ankle swelling, (-) chest pain. Lymphatic: (-) lymph node enlargement. Neurologic: (-) numbness, (-) tingling. Psychiatric: (-) anxiety, (-) depression   Current Medication: Outpatient Encounter Medications as of 06/19/2018  Medication Sig Note  . ACCU-CHEK SOFTCLIX LANCETS lancets Use as instructed  Once a daily E11.65   . acetaminophen (TYLENOL) 500 MG tablet Take 500 mg by mouth every 6 (six) hours as needed.    Marland Kitchen albuterol (PROAIR HFA) 108 (90 Base) MCG/ACT inhaler Inhale 2 puffs into the lungs every 6 (six) hours as needed.   Marland Kitchen allopurinol (ZYLOPRIM) 100 MG tablet Take 1 tablet (100 mg total) by mouth 2 (two) times daily.   Marland Kitchen apixaban (ELIQUIS) 5 MG TABS tablet Take 1 tablet (5 mg total) by mouth 2 (two) times daily.   Marland Kitchen atenolol (TENORMIN) 50 MG tablet Take 1 tablet (50 mg total) by  mouth daily.   Marland Kitchen atorvastatin (LIPITOR) 20 MG tablet Take 1 tablet (20 mg total) by mouth daily.   . Blood Glucose Monitoring Suppl (ACCU-CHEK AVIVA PLUS) w/Device KIT Use once daily to check blood sugar.   . cyclobenzaprine (FLEXERIL) 10 MG tablet Take 1 tablet (10 mg total) by mouth 2 (two) times daily as needed for muscle spasms. (Patient taking differently: Take 10 mg by mouth 2 (two) times daily. )   . diclofenac sodium (VOLTAREN) 1 % GEL Apply 2 g topically 4 (four) times daily.    Marland Kitchen doxycycline (VIBRA-TABS) 100 MG tablet Take 1 tablet (100 mg total) by mouth daily. For acne. 05/01/2018: Prn when experiencing acne flare  . escitalopram (LEXAPRO) 5 MG tablet Take 1 tablet (5 mg total) by mouth daily.   . Fluticasone-Salmeterol (ADVAIR DISKUS) 250-50 MCG/DOSE AEPB Inhale 1 puff into the lungs 2 (two) times daily. Frequency:BID   Dosage:0.0     Instructions:  Note:Dose: 250-50MCG   . gabapentin (NEURONTIN) 300 MG capsule 400 mg 2 (two) times daily.  05/20/2018: Patient taking differently-1 tablet in the am, one at noon, one in the pm  . glimepiride (AMARYL) 1 MG tablet Take 2 tablets (2 mg total) by mouth daily with supper.   . Glucose Blood (ACCU-CHEK AVIVA PLUS VI) by In Vitro route. Pt needs accu check Aviva plus lancets, test strips and meter to check blood sugars once daily   . glucose blood (ACCU-CHEK AVIVA PLUS) test strip 1 each by Other route as needed for other. Use as  instructed   . HYDROcodone-acetaminophen (NORCO) 5-325 MG tablet Take 1 tablet by mouth 2 (two) times daily.    . hydroxypropyl methylcellulose / hypromellose (ISOPTO TEARS / GONIOVISC) 2.5 % ophthalmic solution Place 1 drop into both eyes daily as needed for dry eyes.   Marland Kitchen ipratropium (ATROVENT) 0.02 % nebulizer solution Use every 6 hours for Shortness of breath and wheezing and as needed.  Prescription is for 90 days   . isosorbide mononitrate (IMDUR) 60 MG 24 hr tablet Take 1 tablet (60 mg total) by mouth daily.   Marland Kitchen  loratadine (CLARITIN) 10 MG tablet Take 10 mg by mouth daily.    . metFORMIN (GLUCOPHAGE) 500 MG tablet Take 1 tablet (500 mg total) by mouth 2 (two) times daily with a meal.   . nitroGLYCERIN (NITROSTAT) 0.4 MG SL tablet Place 0.4 mg under the tongue. 01/31/2018: Available if needed.   . OXYGEN Inhale 2 L into the lungs continuous.    . traZODone (DESYREL) 100 MG tablet Take 1 tablet (100 mg total) by mouth at bedtime.   Marland Kitchen umeclidinium bromide (INCRUSE ELLIPTA) 62.5 MCG/INH AEPB Inhale 1 puff into the lungs daily.    No facility-administered encounter medications on file as of 06/19/2018.     Surgical History: Past Surgical History:  Procedure Laterality Date  . ABDOMINAL AORTIC ANEURYSM REPAIR  03/06/2015   [place]    . APPENDECTOMY    . CARDIAC CATHETERIZATION  2010,03/16/2003,11/04/2002  . CARDIOVERSION N/A 04/24/2018   Procedure: CARDIOVERSION;  Surgeon: Nelva Bush, MD;  Location: [facility] ORS;  Service: Cardiovascular;  Laterality: N/A;  . CORONARY ANGIOPLASTY  03/16/2003   stent to the RCA  & 2/18/2004stent mid circumflex  . ELBOW SURGERY    . HERNIA REPAIR    . NASAL SINUS SURGERY    . PENILE PROSTHESIS IMPLANT  1995  . PILONIDAL CYST EXCISION    . TEE WITHOUT CARDIOVERSION N/A 04/24/2018   Procedure: TRANSESOPHAGEAL ECHOCARDIOGRAM (TEE);  Surgeon: Nelva Bush, MD;  Location: [facility] ORS;  Service: Cardiovascular;  Laterality: N/A;  . TONSILECTOMY, ADENOIDECTOMY, BILATERAL MYRINGOTOMY AND TUBES    . UMBILICAL HERNIA REPAIR      Medical History: Past Medical History:  Diagnosis Date  . AAA (abdominal aortic aneurysm) ([place])   . AAA (abdominal aortic aneurysm) ([place])   . Anxiety   . Asthma   . CHF (congestive heart failure) ([place])   . COPD (chronic obstructive pulmonary disease) ([place])   . Coronary artery disease   . Diabetes mellitus without complication ([place])   . ESRD (end stage renal disease) ([place])   . Hyperlipidemia   . Hypertension   . Iliac  artery aneurysm, bilateral ([place])   . Obstructive sleep apnea-hypopnea syndrome   . Sleep apnea     Family History: Family History  Problem Relation Age of Onset  . Hypertension Mother   . Hyperlipidemia Mother   . Depression Mother   . Heart attack Father 17  . Heart disease Father   . Hypertension Father   . Hyperlipidemia Father     Social History: Social History   Socioeconomic History  . Marital status: Divorced    Spouse name: Not on file  . Number of children: 2  . Years of education: 10   . Highest education level: 10th grade  Occupational History  . Occupation: disabled   Social Needs  . Financial resource strain: Somewhat hard  . Food insecurity:    Worry: Never true  Inability: Never true  . Transportation needs:    Medical: No    Non-medical: No  Tobacco Use  . Smoking status: Current Every Day Smoker    Packs/day: 0.25    Years: 45.00    Pack years: 11.25    Types: Cigarettes  . Smokeless tobacco: Former Systems developer    Types: Chew  . Tobacco comment: patient refused couseling information  Substance and Sexual Activity  . Alcohol use: Not Currently  . Drug use: No  . Sexual activity: Not on file  Lifestyle  . Physical activity:    Days per week: 7 days    Minutes per session: 10 min  . Stress: Only a little  Relationships  . Social connections:    Talks on phone: More than three times a week    Gets together: More than three times a week    Attends religious service: Never    Active member of club or organization: No    Attends meetings of clubs or organizations: Not on file    Relationship status: Divorced  . Intimate partner violence:    Fear of current or ex partner: Not on file    Emotionally abused: Not on file    Physically abused: Not on file    Forced sexual activity: Not on file  Other Topics Concern  . Not on file  Social History Narrative  . Not on file    Vital Signs: Blood pressure 112/80, pulse (!) 47, resp. rate 16, height  5' 10"  (1.778 m), weight 263 lb 6.4 oz (119.5 kg), SpO2 91 %.  Examination: General Appearance: The patient is well-developed, well-nourished, and in no distress. Skin: Gross inspection of skin unremarkable. Head: normocephalic, no gross deformities. Eyes: no gross deformities noted. ENT: ears appear grossly normal no exudates. Neck: Supple. No thyromegaly. No LAD. Respiratory: clear to auscultation, no rales or ronchi noted.. Cardiovascular: Normal S1 and S2 without murmur or rub. Extremities: No cyanosis. pulses are equal. Neurologic: Alert and oriented. No involuntary movements.  LABS: Recent Results (from the past 2160 hour(s))  Basic metabolic panel     Status: Abnormal   Collection Time: 04/11/18  5:14 PM  Result Value Ref Range   Sodium 139 135 - 145 mmol/L   Potassium 3.7 3.5 - 5.1 mmol/L   Chloride 104 98 - 111 mmol/L   CO2 25 22 - 32 mmol/L   Glucose, Bld 171 (H) 70 - 99 mg/dL   BUN 12 8 - 23 mg/dL   Creatinine, Ser 0.78 0.61 - 1.24 mg/dL   Calcium 8.7 (L) 8.9 - 10.3 mg/dL   GFR calc non Af Amer >60 >60 mL/min   GFR calc Af Amer >60 >60 mL/min    Comment: (NOTE) The eGFR has been calculated using the CKD EPI equation. This calculation has not been validated in all clinical situations. eGFR's persistently <60 mL/min signify possible Chronic Kidney Disease.    Anion gap 10 5 - 15    Comment: Performed at [facility], [address] [place], [place] [address]  CBC     Status: Abnormal   Collection Time: 04/11/18  5:14 PM  Result Value Ref Range   WBC 9.7 3.8 - 10.6 K/uL   RBC 4.06 (L) 4.40 - 5.90 MIL/uL   Hemoglobin 14.8 13.0 - 18.0 g/dL   HCT 41.6 40.0 - 52.0 %   MCV 102.5 (H) 80.0 - 100.0 fL   MCH 36.5 (H) 26.0 - 34.0 pg   MCHC 35.6  32.0 - 36.0 g/dL   RDW 15.5 (H) 11.5 - 14.5 %   Platelets 145 (L) 150 - 440 K/uL    Comment: Performed at [facility], [address] [place], [place] [address]  Troponin I     Status: None   Collection  Time: 04/11/18  5:14 PM  Result Value Ref Range   Troponin I <0.03 <0.03 ng/mL    Comment: Performed at [facility], [address] [place], [place] [address]  Protime-INR     Status: None   Collection Time: 04/11/18  5:14 PM  Result Value Ref Range   Prothrombin Time 13.4 11.4 - 15.2 seconds   INR 1.03     Comment: Performed at [facility], [address] [place], [place] [address]  APTT     Status: None   Collection Time: 04/11/18  5:14 PM  Result Value Ref Range   aPTT 32 24 - 36 seconds    Comment: Performed at [facility], [address] [place], [place] [address]  Digoxin level     Status: Abnormal   Collection Time: 04/11/18  5:14 PM  Result Value Ref Range   Digoxin Level 0.5 (L) 0.8 - 2.0 ng/mL    Comment: Performed at [facility], [address] [place], [place] [address]  Hepatic function panel     Status: Abnormal   Collection Time: 04/11/18  5:14 PM  Result Value Ref Range   Total Protein 7.4 6.5 - 8.1 g/dL   Albumin 3.4 (L) 3.5 - 5.0 g/dL   AST 27 15 - 41 U/L   ALT 15 0 - 44 U/L   Alkaline Phosphatase 57 38 - 126 U/L   Total Bilirubin 0.4 0.3 - 1.2 mg/dL   Bilirubin, Direct <0.1 0.0 - 0.2 mg/dL   Indirect Bilirubin NOT CALCULATED 0.3 - 0.9 mg/dL    Comment: Performed at [facility], [address] [place],  [address]  POCT HgB A1C     Status: Abnormal   Collection Time: 04/22/18  8:58 AM  Result Value Ref Range   Hemoglobin A1C 6.4 (A) 4.0 - 5.6 %   HbA1c POC (<> result, manual entry)  4.0 - 5.6 %   HbA1c, POC (prediabetic range)  5.7 - 6.4 %   HbA1c, POC (controlled diabetic range)  0.0 - 7.0 %  Glucose, capillary     Status: Abnormal   Collection Time: 04/24/18  7:22 AM  Result Value Ref Range   Glucose-Capillary 168 (H) 70 - 99 mg/dL    Radiology: No results found.  No results found.  No results found.    Assessment and Plan: Patient Active Problem List   Diagnosis Date Noted   . Chronic a-fib 05/02/2018  . Atrial flutter ([place]) 04/09/2018  . Uncontrolled type 2 diabetes mellitus with hyperglycemia ([place]) 01/09/2018  . Nicotine dependence with current use 01/09/2018  . Diabetes mellitus without complication ([place]) 09/32/3557  . Myalgia 09/19/2017  . Vitamin B12 deficiency anemia 09/19/2017  . Common migraine with intractable migraine 09/19/2017  . Chronic hypoxemic respiratory failure ([place]) 09/19/2017  . Peripheral vascular disease, unspecified ([place]) 09/19/2017  . Dysuria 09/19/2017  . Osteoarthritis 09/19/2017  . Diverticulitis of intestine 09/19/2017  . Cervicalgia 09/19/2017  . Unspecified abdominal hernia without obstruction or gangrene 09/19/2017  . Vasomotor rhinitis 09/19/2017  . Occlusion and stenosis of bilateral carotid arteries 09/19/2017  . Generalized anxiety disorder 09/19/2017  . Malaise 09/19/2017  . Chronic bronchitis ([place])  09/19/2017  . Obstructive sleep apnea (adult) (pediatric) 09/19/2017  . Cardiac arrhythmia 09/19/2017  . Smoker 04/02/2016  . Pain in the chest 08/20/2014  . SOB (shortness of breath) 08/20/2014  . Coronary artery disease involving native coronary artery of native heart with angina pectoris with documented spasm ([place]) 08/20/2014  . Emphysema of lung ([place]) 08/20/2014  . Coronary artery disease involving native coronary artery of native heart with unstable angina pectoris ([place]) 08/20/2014  . AAA (abdominal aortic aneurysm) without rupture ([place]) 08/20/2014  . Iliac artery aneurysm, bilateral ([place]) 08/20/2014  . Hyperlipidemia 08/20/2014  . Essential hypertension 08/20/2014  . Morbid obesity ([place]) 08/20/2014    1. OSA on CPAP Continue to use CPAP with oxygen every night.  Discussed importance of compliance as well as cleaning machine.   2. Chronic a-fib Recently cardiovered in hospital.  HR today on repeat was 59bpm.  Well controlled On Imdur.  3. SOB (shortness of breath) FEV1 71% predicted FEV/FVC 109%  predicted - Spirometry with Graph  4. Chronic hypoxemic respiratory failure (HCC) Continue using oxygen during the day and at night with cpap.  Continue at 2 LPM as ordered.   5. Flu vaccine need - Flu Vaccine MDCK QUAD PF  6. Cigarette nicotine dependence with nicotine-induced disorder Smoking cessation counseling: 1. Pt acknowledges the risks of long term smoking, she will try to quite smoking. 2. Options for different medications including nicotine products, chewing gum, patch etc, Wellbutrin and Chantix is discussed 3. Goal and date of compete cessation is discussed 4. Total time spent in smoking cessation is 15 min.   7. Morbid obesity ([place]) Obesity Counseling: Risk Assessment: An assessment of behavioral risk factors was made today and includes lack of exercise sedentary lifestyle, lack of portion control and poor dietary habits.  Risk Modification Advice: She was counseled on portion control guidelines. Restricting daily caloric intake to. . The detrimental long term effects of obesity on her health and ongoing poor compliance was also discussed with the patient.  8. Acute non-recurrent frontal sinusitis Return to clinic if symptoms fail to improve, or new or worsening symptoms develop. - amoxicillin-clavulanate (AUGMENTIN) 875-125 MG tablet; Take 1 tablet by mouth 2 (two) times daily.  Dispense: 20 tablet; Refill: 0  General Counseling: I have discussed the findings of the evaluation and examination with Lawrence Steele.  I have also discussed any further diagnostic evaluation thatmay be needed or ordered today. Dillin verbalizes understanding of the findings of todays visit. We also reviewed his medications today and discussed drug interactions and side effects including but not limited excessive drowsiness and altered mental states. We also discussed that there is always a risk not just to him but also people around him. he has been encouraged to call the office with any questions or  concerns that should arise related to todays visit.    Time spent: 20 This patient was seen by Orson Gear AGNP-C in Collaboration with Dr. Devona Konig as a part of collaborative care agreement.  I have personally obtained a history, examined the patient, evaluated laboratory and imaging results, formulated the assessment and plan and placed orders.    [name], MD [facility] Pulmonary and Critical Care Sleep medicine

## 2018-06-23 ENCOUNTER — Other Ambulatory Visit: Payer: Self-pay | Admitting: *Deleted

## 2018-06-23 ENCOUNTER — Ambulatory Visit: Payer: Self-pay | Admitting: *Deleted

## 2018-06-23 NOTE — Patient Outreach (Signed)
[facility]) Care Management  06/23/2018  Lawrence Steele 1950-12-24 [id number]   RN Health Coach attempted #2 follow up outreach call to patient.  Patient was unavailable. HIPPA compliance voicemail message left with return callback number.  Plan: RN will call patient again within 10 business days.  [place] Care Management [phone]

## 2018-07-01 ENCOUNTER — Other Ambulatory Visit: Payer: Self-pay

## 2018-07-01 MED ORDER — TRAZODONE HCL 100 MG PO TABS: 100.0000 mg | ORAL_TABLET | Freq: Every day | ORAL | 3 refills | Status: DC

## 2018-07-09 ENCOUNTER — Other Ambulatory Visit: Payer: Self-pay | Admitting: *Deleted

## 2018-07-09 NOTE — Patient Outreach (Addendum)
[facility]) Care Management  07/09/2018   Lawrence Steele Jul 29, 1951 [id number]  RN Health Coach telephone call to patient.  [employer] compliance verified. Per patient his fasting blood sugar was 127. Patient stated that he is checking his blood sugar but had stopped writing down. Per patient he did have an episode where his blood sugar was over 300. He slept for 3 days. Per patient he knew why because he had eaten some cookies. RN discussed eating healthy snacks,  Patient stated that physical therapy comes and works with him once a week to help with his balance. Per patient he uses a cane. Patient goes once a week and grocery shops. Per patient he use to wear compression hose but they have  Worn out. RN informed patient about calling and getting the [facility] catalog. Patient has never ordered from the catalog. Patient has agreed to further outreach calls.    Current Medications:  Current Outpatient Medications  Medication Sig Dispense Refill  . ACCU-CHEK SOFTCLIX LANCETS lancets Use as instructed  Once a daily E11.65 100 each 1  . acetaminophen (TYLENOL) 500 MG tablet Take 500 mg by mouth every 6 (six) hours as needed.     Marland Kitchen albuterol (PROAIR HFA) 108 (90 Base) MCG/ACT inhaler Inhale 2 puffs into the lungs every 6 (six) hours as needed. 3 Inhaler 3  . allopurinol (ZYLOPRIM) 100 MG tablet Take 1 tablet (100 mg total) by mouth 2 (two) times daily. 180 tablet 1  . amoxicillin-clavulanate (AUGMENTIN) 875-125 MG tablet Take 1 tablet by mouth 2 (two) times daily. 20 tablet 0  . apixaban (ELIQUIS) 5 MG TABS tablet Take 1 tablet (5 mg total) by mouth 2 (two) times daily. 60 tablet 3  . atenolol (TENORMIN) 50 MG tablet Take 1 tablet (50 mg total) by mouth daily. 90 tablet 3  . atorvastatin (LIPITOR) 20 MG tablet Take 1 tablet (20 mg total) by mouth daily. 30 tablet 11  . Blood Glucose Monitoring Suppl (ACCU-CHEK AVIVA PLUS) w/Device KIT Use once daily to check blood sugar. 1 kit 3  .  cyclobenzaprine (FLEXERIL) 10 MG tablet Take 1 tablet (10 mg total) by mouth 2 (two) times daily as needed for muscle spasms. (Patient taking differently: Take 10 mg by mouth 2 (two) times daily. ) 60 tablet 2  . diclofenac sodium (VOLTAREN) 1 % GEL Apply 2 g topically 4 (four) times daily.     Marland Kitchen doxycycline (VIBRA-TABS) 100 MG tablet Take 1 tablet (100 mg total) by mouth daily. For acne. 30 tablet 4  . escitalopram (LEXAPRO) 5 MG tablet Take 1 tablet (5 mg total) by mouth daily. 30 tablet 3  . Fluticasone-Salmeterol (ADVAIR DISKUS) 250-50 MCG/DOSE AEPB Inhale 1 puff into the lungs 2 (two) times daily. Frequency:BID   Dosage:0.0     Instructions:  Note:Dose: 250-50MCG 3 each 3  . gabapentin (NEURONTIN) 300 MG capsule 400 mg 2 (two) times daily.     Marland Kitchen glimepiride (AMARYL) 1 MG tablet Take 2 tablets (2 mg total) by mouth daily with supper. 180 tablet 1  . Glucose Blood (ACCU-CHEK AVIVA PLUS VI) by In Vitro route. Pt needs accu check Aviva plus lancets, test strips and meter to check blood sugars once daily    . glucose blood (ACCU-CHEK AVIVA PLUS) test strip 1 each by Other route as needed for other. Use as instructed 100 each 3  . HYDROcodone-acetaminophen (NORCO) 5-325 MG tablet Take 1 tablet by mouth 2 (two) times daily.     Marland Kitchen  hydroxypropyl methylcellulose / hypromellose (ISOPTO TEARS / GONIOVISC) 2.5 % ophthalmic solution Place 1 drop into both eyes daily as needed for dry eyes.    Marland Kitchen ipratropium (ATROVENT) 0.02 % nebulizer solution Use every 6 hours for Shortness of breath and wheezing and as needed.  Prescription is for 90 days 900 mL 1  . isosorbide mononitrate (IMDUR) 60 MG 24 hr tablet Take 1 tablet (60 mg total) by mouth daily. 90 tablet 1  . loratadine (CLARITIN) 10 MG tablet Take 10 mg by mouth daily.     . metFORMIN (GLUCOPHAGE) 500 MG tablet Take 1 tablet (500 mg total) by mouth 2 (two) times daily with a meal. 180 tablet 1  . nitroGLYCERIN (NITROSTAT) 0.4 MG SL tablet Place 0.4 mg under  the tongue.    . OXYGEN Inhale 2 L into the lungs continuous.     . traZODone (DESYREL) 100 MG tablet Take 1 tablet (100 mg total) by mouth at bedtime. 30 tablet 3  . umeclidinium bromide (INCRUSE ELLIPTA) 62.5 MCG/INH AEPB Inhale 1 puff into the lungs daily. 3 each 3   No current facility-administered medications for this visit.     Functional Status:  In your present state of health, do you have any difficulty performing the following activities: 07/09/2018 01/31/2018  Hearing? N N  Vision? N N  Difficulty concentrating or making decisions? N N  Walking or climbing stairs? Y Y  Dressing or bathing? N N  Doing errands, shopping? Tempie Donning  Preparing Food and eating ? N N  Using the Toilet? N N  In the past six months, have you accidently leaked urine? N N  Do you have problems with loss of bowel control? N N  Managing your Medications? N N  Managing your Finances? Tempie Donning  Comment - daughter assists   Housekeeping or managing your Housekeeping? Y Y  Some recent data might be hidden    Fall/Depression Screening: Fall Risk  07/09/2018 06/19/2018 04/22/2018  Falls in the past year? Yes Yes Yes  Number falls in past yr: _0 Injury with Fall? No No No  Risk for fall due to : History of fall(s);Impaired balance/gait;Impaired mobility - -  Follow up Falls evaluation completed - -   PHQ 2/9 Scores 07/09/2018 06/19/2018 04/22/2018 01/31/2018 01/30/2018 01/09/2018 09/30/2017  PHQ - 2 Score 0 0 0 2 0 0 0  PHQ- 9 Score - - - 4 - - -   THN CM Care Plan Problem One     Most Recent Value  Care Plan Problem One  Knowledge Deficit in Self Management of Diabetes  Role Documenting the Problem One  [place] for Problem One  Active  THN Long Term Goal   Patient will not see an increase in his A1C  from 6.4  within the next 90 days  THN Long Term Goal Start Date  07/09/18  Interventions for Problem One Long Term Goal  RN discussed what the fasting blood sugar should be to see the A1C less than  7. RN discussed healthy eating and medication adherence. RN will follow up with further discussion  THN CM Short Term Goal #1   Patient will be able to verbalize symptoms of hypo and hyperglycemia within the next 30 days  THN CM Short Term Goal #1 Start Date  07/09/18  Interventions for Short Term Goal #1  RN discussed the patient symptoms of hyper and hypoglycemia. RN sent a picture sheet of hypo  and hyperglycemia and action plan. RN willfollow up with further discussion  THN CM Short Term Goal #2   Patient will be able to identify foods that make up a healthy snack within the next 30 days  THN CM Short Term Goal #2 Start Date  07/09/18  Interventions for Short Term Goal #2  RN discussed eating healthy snacks. RN sent a list of healthy low carb snacks for patient to eat. RN will follow up with further discussion and teach back  THN CM Short Term Goal #3  Patient will check and document blood sugars as per physician order  [facility] CM Short Term Goal #3 Start Date  07/09/18  Interventions for Short Tern Goal #3  RN discussed with patient about checking blood sugars and documenting. RN texplained to patient to take to physician with office visits. RN sent a 2019/2020 calendar book for documentation. RN will follow up for compliance       Assessment:  Fasting blood sugar 127 A1C 6.4 Patient will benefit from [place] telephonic outreach for education and support for diabetes self management.  Plan:  RN discussed healthy snacks RN discussed documenting blood sugars and taking to physician RN discussed Holiday eating RN sent a list of healthy low carb snacks RN discussed symptoms of hypo and hyperglycemia RN sent a picture sheet of hypo and hyperglycemia and action plan RN sent a 2019/2020 calendar book RN sent barriers letter and assessment to physician RN will follow up within the month of November Patient will call [employer] benefits and order a catalog  [facility] [phone]

## 2018-07-22 ENCOUNTER — Ambulatory Visit (INDEPENDENT_AMBULATORY_CARE_PROVIDER_SITE_OTHER): Payer: Medicare HMO | Admitting: Adult Health

## 2018-07-22 ENCOUNTER — Encounter: Payer: Self-pay | Admitting: Adult Health

## 2018-07-22 VITALS — BP 116/72 | HR 70 | Resp 16 | Ht 69.0 in | Wt 271.0 lb

## 2018-07-22 DIAGNOSIS — E1165 Type 2 diabetes mellitus with hyperglycemia: Secondary | ICD-10-CM

## 2018-07-22 DIAGNOSIS — E782 Mixed hyperlipidemia: Secondary | ICD-10-CM

## 2018-07-22 DIAGNOSIS — F17200 Nicotine dependence, unspecified, uncomplicated: Secondary | ICD-10-CM

## 2018-07-22 DIAGNOSIS — I482 Chronic atrial fibrillation, unspecified: Secondary | ICD-10-CM | POA: Diagnosis not present

## 2018-07-22 DIAGNOSIS — Z125 Encounter for screening for malignant neoplasm of prostate: Secondary | ICD-10-CM

## 2018-07-22 DIAGNOSIS — Z0001 Encounter for general adult medical examination with abnormal findings: Secondary | ICD-10-CM | POA: Diagnosis not present

## 2018-07-22 DIAGNOSIS — R300 Dysuria: Secondary | ICD-10-CM

## 2018-07-22 DIAGNOSIS — G4733 Obstructive sleep apnea (adult) (pediatric): Secondary | ICD-10-CM

## 2018-07-22 DIAGNOSIS — I10 Essential (primary) hypertension: Secondary | ICD-10-CM

## 2018-07-22 DIAGNOSIS — S6010XS Contusion of unspecified finger with damage to nail, sequela: Secondary | ICD-10-CM

## 2018-07-22 DIAGNOSIS — Z9989 Dependence on other enabling machines and devices: Secondary | ICD-10-CM

## 2018-07-22 LAB — POCT GLYCOSYLATED HEMOGLOBIN (HGB A1C): Hemoglobin A1C: 7.2 % — AB (ref 4.0–5.6)

## 2018-07-22 NOTE — Progress Notes (Signed)
[facility] [place], [place] [address]  Internal MEDICINE  Office Visit Note  Patient Name: Lawrence Steele  [id number]  [id number]  Date of Service: 07/22/2018  Chief Complaint  Patient presents with  . Annual Exam    medicare well visit   . Diabetes  . Hypertension  . Hyperlipidemia  . Quality Metric Gaps    foot exam      HPI Pt is here for routine health maintenance examination.  Patient is well-appearing 67 year old Caucasian male.  He reports today for his annual wellness visit.  He has a history of afib, osa, diabetes, hypertension, and hyperlipidemia.  He denies any issues currently.  Generally he has been doing well.  He is a native foot exam to close his quality metric gaps which will be provided today.  He reports that he smokes approximately 10 cigarettes a day.  He denies any alcohol or illicit drug use.  He is also complaining of his fingernail on his left first digit.  He states that he smashed his finger while working a few years ago and has had trouble with this fingernail.  It appears to be eroded and he reports any time the nail tries to grow back will get pulled off very easily.  Current Medication: Outpatient Encounter Medications as of 07/22/2018  Medication Sig Note  . ACCU-CHEK SOFTCLIX LANCETS lancets Use as instructed  Once a daily E11.65   . acetaminophen (TYLENOL) 500 MG tablet Take 500 mg by mouth every 6 (six) hours as needed.    Marland Kitchen albuterol (PROAIR HFA) 108 (90 Base) MCG/ACT inhaler Inhale 2 puffs into the lungs every 6 (six) hours as needed.   Marland Kitchen allopurinol (ZYLOPRIM) 100 MG tablet Take 1 tablet (100 mg total) by mouth 2 (two) times daily.   Marland Kitchen amoxicillin-clavulanate (AUGMENTIN) 875-125 MG tablet Take 1 tablet by mouth 2 (two) times daily.   Marland Kitchen apixaban (ELIQUIS) 5 MG TABS tablet Take 1 tablet (5 mg total) by mouth 2 (two) times daily.   Marland Kitchen atenolol (TENORMIN) 50 MG tablet Take 1 tablet (50 mg total) by mouth daily.   Marland Kitchen  atorvastatin (LIPITOR) 20 MG tablet Take 1 tablet (20 mg total) by mouth daily.   . Blood Glucose Monitoring Suppl (ACCU-CHEK AVIVA PLUS) w/Device KIT Use once daily to check blood sugar.   . cyclobenzaprine (FLEXERIL) 10 MG tablet Take 1 tablet (10 mg total) by mouth 2 (two) times daily as needed for muscle spasms. (Patient taking differently: Take 10 mg by mouth 2 (two) times daily. )   . diclofenac sodium (VOLTAREN) 1 % GEL Apply 2 g topically 4 (four) times daily.    Marland Kitchen doxycycline (VIBRA-TABS) 100 MG tablet Take 1 tablet (100 mg total) by mouth daily. For acne. 05/01/2018: Prn when experiencing acne flare  . escitalopram (LEXAPRO) 5 MG tablet Take 1 tablet (5 mg total) by mouth daily.   . Fluticasone-Salmeterol (ADVAIR DISKUS) 250-50 MCG/DOSE AEPB Inhale 1 puff into the lungs 2 (two) times daily. Frequency:BID   Dosage:0.0     Instructions:  Note:Dose: 250-50MCG   . gabapentin (NEURONTIN) 300 MG capsule 400 mg 2 (two) times daily.  05/20/2018: Patient taking differently-1 tablet in the am, one at noon, one in the pm  . glimepiride (AMARYL) 1 MG tablet Take 2 tablets (2 mg total) by mouth daily with supper.   . Glucose Blood (ACCU-CHEK AVIVA PLUS VI) by In Vitro route. Pt needs accu check Aviva plus lancets, test strips and  meter to check blood sugars once daily   . glucose blood (ACCU-CHEK AVIVA PLUS) test strip 1 each by Other route as needed for other. Use as instructed   . HYDROcodone-acetaminophen (NORCO) 5-325 MG tablet Take 1 tablet by mouth 2 (two) times daily.    . hydroxypropyl methylcellulose / hypromellose (ISOPTO TEARS / GONIOVISC) 2.5 % ophthalmic solution Place 1 drop into both eyes daily as needed for dry eyes.   Marland Kitchen ipratropium (ATROVENT) 0.02 % nebulizer solution Use every 6 hours for Shortness of breath and wheezing and as needed.  Prescription is for 90 days   . isosorbide mononitrate (IMDUR) 60 MG 24 hr tablet Take 1 tablet (60 mg total) by mouth daily.   Marland Kitchen loratadine (CLARITIN) 10  MG tablet Take 10 mg by mouth daily.    . metFORMIN (GLUCOPHAGE) 500 MG tablet Take 1 tablet (500 mg total) by mouth 2 (two) times daily with a meal.   . nitroGLYCERIN (NITROSTAT) 0.4 MG SL tablet Place 0.4 mg under the tongue. 01/31/2018: Available if needed.   . OXYGEN Inhale 2 L into the lungs continuous.    . traZODone (DESYREL) 100 MG tablet Take 1 tablet (100 mg total) by mouth at bedtime.   Marland Kitchen umeclidinium bromide (INCRUSE ELLIPTA) 62.5 MCG/INH AEPB Inhale 1 puff into the lungs daily.    No facility-administered encounter medications on file as of 07/22/2018.     Surgical History: Past Surgical History:  Procedure Laterality Date  . ABDOMINAL AORTIC ANEURYSM REPAIR  03/06/2015   [facility]    . APPENDECTOMY    . CARDIAC CATHETERIZATION  2010,03/16/2003,11/04/2002  . CARDIOVERSION N/A 04/24/2018   Procedure: CARDIOVERSION;  Surgeon: Nelva Bush, MD;  Location: [facility] ORS;  Service: Cardiovascular;  Laterality: N/A;  . CORONARY ANGIOPLASTY  03/16/2003   stent to the RCA  & 2/18/2004stent mid circumflex  . ELBOW SURGERY    . HERNIA REPAIR    . NASAL SINUS SURGERY    . PENILE PROSTHESIS IMPLANT  1995  . PILONIDAL CYST EXCISION    . TEE WITHOUT CARDIOVERSION N/A 04/24/2018   Procedure: TRANSESOPHAGEAL ECHOCARDIOGRAM (TEE);  Surgeon: Nelva Bush, MD;  Location: [facility] ORS;  Service: Cardiovascular;  Laterality: N/A;  . TONSILECTOMY, ADENOIDECTOMY, BILATERAL MYRINGOTOMY AND TUBES    . UMBILICAL HERNIA REPAIR      Medical History: Past Medical History:  Diagnosis Date  . AAA (abdominal aortic aneurysm) ([place])   . AAA (abdominal aortic aneurysm) ([place])   . Anxiety   . Asthma   . CHF (congestive heart failure) ([place])   . COPD (chronic obstructive pulmonary disease) ([place])   . Coronary artery disease   . Diabetes mellitus without complication ([place])   . ESRD (end stage renal disease) ([place])   . Hyperlipidemia   . Hypertension   . Iliac artery aneurysm, bilateral  ([place])   . Obstructive sleep apnea-hypopnea syndrome   . Sleep apnea     Family History: Family History  Problem Relation Age of Onset  . Hypertension Mother   . Hyperlipidemia Mother   . Depression Mother   . Heart attack Father 35  . Heart disease Father   . Hypertension Father   . Hyperlipidemia Father     Depression screen [facility] 2/9 07/22/2018 07/09/2018 06/19/2018 04/22/2018 01/31/2018  Decreased Interest 0 0 0 0 1  Down, Depressed, Hopeless 0 0 0 0 1  PHQ - 2 Score 0 0 0 0 2  Altered sleeping - - - -  0  Tired, decreased energy - - - - 1  Change in appetite - - - - 0  Feeling bad or failure about yourself  - - - - 0  Trouble concentrating - - - - 0  Moving slowly or fidgety/restless - - - - 1  Suicidal thoughts - - - - 0  PHQ-9 Score - - - - 4  Difficult doing work/chores - - - - Not difficult at all    Functional Status Survey: Is the patient deaf or have difficulty hearing?: No Does the patient have difficulty seeing, even when wearing glasses/contacts?: No Does the patient have difficulty concentrating, remembering, or making decisions?: No Does the patient have difficulty walking or climbing stairs?: No Does the patient have difficulty dressing or bathing?: No Does the patient have difficulty doing errands alone such as visiting a doctor's office or shopping?: No  MMSE - Mini Mental State Exam 07/22/2018  Orientation to time 5  Orientation to Place 5  Registration 3  Attention/ Calculation 5  Recall 3  Language- name 2 objects 2  Language- repeat 1  Language- follow 3 step command 3  Language- read & follow direction 1  Write a sentence 1  Copy design 1  Total score 30    Fall Risk  07/22/2018 07/09/2018 06/19/2018 04/22/2018 01/31/2018  Falls in the past year? 0 Yes Yes Yes Yes  Number falls in past yr: - _0 Injury with Fall? - No No No No  Risk for fall due to : - History of fall(s);Impaired balance/gait;Impaired mobility - - -  Follow up - Falls  evaluation completed - - -     Review of Systems  Constitutional: Negative.  Negative for chills, fatigue and unexpected weight change.  HENT: Negative.  Negative for congestion, rhinorrhea, sneezing and sore throat.   Eyes: Negative for redness.  Respiratory: Negative.  Negative for cough, chest tightness and shortness of breath.   Cardiovascular: Negative.  Negative for chest pain and palpitations.  Gastrointestinal: Negative.  Negative for abdominal pain, constipation, diarrhea, nausea and vomiting.  Endocrine: Negative.   Genitourinary: Negative.  Negative for dysuria and frequency.  Musculoskeletal: Negative.  Negative for arthralgias, back pain, joint swelling and neck pain.  Skin: Negative.  Negative for rash.  Allergic/Immunologic: Negative.   Neurological: Negative.  Negative for tremors and numbness.  Hematological: Negative for adenopathy. Does not bruise/bleed easily.  Psychiatric/Behavioral: Negative.  Negative for behavioral problems, sleep disturbance and suicidal ideas. The patient is not nervous/anxious.      Vital Signs: BP 116/72 (BP Location: Left Arm, Patient Position: Sitting, Cuff Size: Normal)   Pulse 70   Resp 16   Ht _1  (1.753 m)   Wt 271 lb (122.9 kg)   SpO2 92%   BMI 40.02 kg/m    Physical Exam  Constitutional: He is oriented to person, place, and time. He appears well-developed and well-nourished. No distress.  HENT:  Head: Normocephalic and atraumatic.  Mouth/Throat: Oropharynx is clear and moist. No oropharyngeal exudate.  Eyes: Pupils are equal, round, and reactive to light. EOM are normal.  Neck: Normal range of motion. Neck supple. No JVD present. No tracheal deviation present. No thyromegaly present.  Cardiovascular: Normal rate, regular rhythm and normal heart sounds. Exam reveals no gallop and no friction rub.  No murmur heard. Pulmonary/Chest: Effort normal and breath sounds normal. No respiratory distress. He has no wheezes. He has  no rales. He exhibits no tenderness.  Abdominal:  Soft. There is no tenderness. There is no guarding.  Musculoskeletal: Normal range of motion.  Left hand first finger, nail erosion greater than one year.   Lymphadenopathy:    He has no cervical adenopathy.  Neurological: He is alert and oriented to person, place, and time. No cranial nerve deficit.  Skin: Skin is warm and dry. He is not diaphoretic.  Psychiatric: He has a normal mood and affect. His behavior is normal. Judgment and thought content normal.  Nursing note and vitals reviewed.    LABS: Recent Results (from the past 2160 hour(s))  Glucose, capillary     Status: Abnormal   Collection Time: 04/24/18  7:22 AM  Result Value Ref Range   Glucose-Capillary 168 (H) 70 - 99 mg/dL  POCT HgB A1C     Status: Abnormal   Collection Time: 07/22/18 10:54 AM  Result Value Ref Range   Hemoglobin A1C 7.2 (A) 4.0 - 5.6 %   HbA1c POC (<> result, manual entry)     HbA1c, POC (prediabetic range)     HbA1c, POC (controlled diabetic range)      Assessment/Plan: 1. Encounter for general adult medical examination with abnormal findings Patient up-to-date on preventative health maintenance. - CBC with Differential/Platelet - Lipid Panel With LDL/HDL Ratio - TSH - T4, free - Comprehensive metabolic panel  2. Uncontrolled type 2 diabetes mellitus with hyperglycemia (HCC) Patient's A1c elevated today at 7.2.  Patient reports he has been eating something that he was not supposed to.  He does report he has been taking his medicine as prescribed. - Microalbumin, urine - POCT HgB A1C  3. Morbid obesity (HCC) Obesity Counseling: Risk Assessment: An assessment of behavioral risk factors was made today and includes lack of exercise sedentary lifestyle, lack of portion control and poor dietary habits.  Risk Modification Advice: She was counseled on portion control guidelines. Restricting daily caloric intake to. . The detrimental long term effects  of obesity on her health and ongoing poor compliance was also discussed with the patient.  4. Chronic a-fib Patient sees cardiology for chronic A. fib.  Continue to follow-up as indicated.  5. Essential hypertension Patient's blood pressure stable at this time.  Continue medications as directed.  6. Mixed hyperlipidemia Orders for lipid panel sent today, patient will have lipid panel drawn in the next week or so.  7. Contusion of fingernail, sequela Patient has been dealing with this erosion/contusion to his fingernail for quite some time.  He reports having to wear a Band-Aid all the time because of the way it looks.  He states he would love to just have the end of his finger cut off so they did not to deal with it anymore.  This erosion is in the center of his nail and has moved completely distal to that now what slept of his fingernail was horseshoe shaped.  He states that it will bleed and it hurts with anything touching it.  He will see Ortho tomorrow about separate issue and I encouraged him to ask them who he should see for his fingernail.  8. Nicotine dependence with current use Smoking cessation counseling: 1. Pt acknowledges the risks of long term smoking, she will try to quite smoking. 2. Options for different medications including nicotine products, chewing gum, patch etc, Wellbutrin and Chantix is discussed 3. Goal and date of compete cessation is discussed 4. Total time spent in smoking cessation is 15 min.  9. OSA on CPAP Continue wearing CPAP as directed.  Patient reports he sleeps well with his CPAP.  10. Dysuria - UA/M w/rflx Culture, Routine  11. Screening for prostate cancer - PSA General Counseling: Kani verbalizes understanding of the findings of todays visit and agrees with plan of treatment. I have discussed any further diagnostic evaluation that may be needed or ordered today. We also reviewed his medications today. he has been encouraged to call the office with  any questions or concerns that should arise related to todays visit.   Orders Placed This Encounter  Procedures  . UA/M w/rflx Culture, Routine  . Microalbumin, urine  . CBC with Differential/Platelet  . Lipid Panel With LDL/HDL Ratio  . TSH  . T4, free  . Comprehensive metabolic panel  . PSA  . POCT HgB A1C    No orders of the defined types were placed in this encounter.   Time spent: 35 Minutes   This patient was seen by Orson Gear AGNP-C in Collaboration with Dr Lavera Guise as a part of collaborative care agreement    Kendell Bane AGNP-C Internal Medicine

## 2018-07-22 NOTE — Patient Instructions (Signed)
Diabetes Mellitus and Nutrition When you have diabetes (diabetes mellitus), it is very important to have healthy eating habits because your blood sugar (glucose) levels are greatly affected by what you eat and drink. Eating healthy foods in the appropriate amounts, at about the same times every day, can help you:  Control your blood glucose.  Lower your risk of heart disease.  Improve your blood pressure.  Reach or maintain a healthy weight.  Every person with diabetes is different, and each person has different needs for a meal plan. Your health care provider may recommend that you work with a diet and nutrition specialist (dietitian) to make a meal plan that is best for you. Your meal plan may vary depending on factors such as:  The calories you need.  The medicines you take.  Your weight.  Your blood glucose, blood pressure, and cholesterol levels.  Your activity level.  Other health conditions you have, such as heart or kidney disease.  How do carbohydrates affect me? Carbohydrates affect your blood glucose level more than any other type of food. Eating carbohydrates naturally increases the amount of glucose in your blood. Carbohydrate counting is a method for keeping track of how many carbohydrates you eat. Counting carbohydrates is important to keep your blood glucose at a healthy level, especially if you use insulin or take certain oral diabetes medicines. It is important to know how many carbohydrates you can safely have in each meal. This is different for every person. Your dietitian can help you calculate how many carbohydrates you should have at each meal and for snack. Foods that contain carbohydrates include:  Bread, cereal, rice, pasta, and crackers.  Potatoes and corn.  Peas, beans, and lentils.  Milk and yogurt.  Fruit and juice.  Desserts, such as cakes, cookies, ice cream, and candy.  How does alcohol affect me? Alcohol can cause a sudden decrease in blood  glucose (hypoglycemia), especially if you use insulin or take certain oral diabetes medicines. Hypoglycemia can be a life-threatening condition. Symptoms of hypoglycemia (sleepiness, dizziness, and confusion) are similar to symptoms of having too much alcohol. If your health care provider says that alcohol is safe for you, follow these guidelines:  Limit alcohol intake to no more than 1 drink per day for nonpregnant women and 2 drinks per day for men. One drink equals 12 oz of beer, 5 oz of wine, or 1 oz of hard liquor.  Do not drink on an empty stomach.  Keep yourself hydrated with water, diet soda, or unsweetened iced tea.  Keep in mind that regular soda, juice, and other mixers may contain a lot of sugar and must be counted as carbohydrates.  What are tips for following this plan? Reading food labels  Start by checking the serving size on the label. The amount of calories, carbohydrates, fats, and other nutrients listed on the label are based on one serving of the food. Many foods contain more than one serving per package.  Check the total grams (g) of carbohydrates in one serving. You can calculate the number of servings of carbohydrates in one serving by dividing the total carbohydrates by 15. For example, if a food has 30 g of total carbohydrates, it would be equal to 2 servings of carbohydrates.  Check the number of grams (g) of saturated and trans fats in one serving. Choose foods that have low or no amount of these fats.  Check the number of milligrams (mg) of sodium in one serving. Most people   should limit total sodium intake to less than 2,300 mg per day.  Always check the nutrition information of foods labeled as "low-fat" or "nonfat". These foods may be higher in added sugar or refined carbohydrates and should be avoided.  Talk to your dietitian to identify your daily goals for nutrients listed on the label. Shopping  Avoid buying canned, premade, or processed foods. These  foods tend to be high in fat, sodium, and added sugar.  Shop around the outside edge of the grocery store. This includes fresh fruits and vegetables, bulk grains, fresh meats, and fresh dairy. Cooking  Use low-heat cooking methods, such as baking, instead of high-heat cooking methods like deep frying.  Cook using healthy oils, such as olive, canola, or sunflower oil.  Avoid cooking with butter, cream, or high-fat meats. Meal planning  Eat meals and snacks regularly, preferably at the same times every day. Avoid going long periods of time without eating.  Eat foods high in fiber, such as fresh fruits, vegetables, beans, and whole grains. Talk to your dietitian about how many servings of carbohydrates you can eat at each meal.  Eat 4-6 ounces of lean protein each day, such as lean meat, chicken, fish, eggs, or tofu. 1 ounce is equal to 1 ounce of meat, chicken, or fish, 1 egg, or 1/4 cup of tofu.  Eat some foods each day that contain healthy fats, such as avocado, nuts, seeds, and fish. Lifestyle   Check your blood glucose regularly.  Exercise at least 30 minutes 5 or more days each week, or as told by your health care provider.  Take medicines as told by your health care provider.  Do not use any products that contain nicotine or tobacco, such as cigarettes and e-cigarettes. If you need help quitting, ask your health care provider.  Work with a counselor or diabetes educator to identify strategies to manage stress and any emotional and social challenges. What are some questions to ask my health care provider?  Do I need to meet with a diabetes educator?  Do I need to meet with a dietitian?  What number can I call if I have questions?  When are the best times to check my blood glucose? Where to find more information:  American Diabetes Association: diabetes.org/food-and-fitness/food  Academy of Nutrition and Dietetics:  www.eatright.org/resources/health/diseases-and-conditions/diabetes  National Institute of Diabetes and Digestive and Kidney Diseases (NIH): www.niddk.nih.gov/health-information/diabetes/overview/diet-eating-physical-activity Summary  A healthy meal plan will help you control your blood glucose and maintain a healthy lifestyle.  Working with a diet and nutrition specialist (dietitian) can help you make a meal plan that is best for you.  Keep in mind that carbohydrates and alcohol have immediate effects on your blood glucose levels. It is important to count carbohydrates and to use alcohol carefully. This information is not intended to replace advice given to you by your health care provider. Make sure you discuss any questions you have with your health care provider. Document Released: 05/31/2005 Document Revised: 10/08/2016 Document Reviewed: 10/08/2016 Elsevier Interactive Patient Education  2018 Elsevier Inc.  

## 2018-07-23 ENCOUNTER — Ambulatory Visit: Payer: Self-pay | Admitting: Adult Health

## 2018-07-23 LAB — UA/M W/RFLX CULTURE, ROUTINE
Bilirubin, UA: NEGATIVE
Glucose, UA: NEGATIVE
Ketones, UA: NEGATIVE
LEUKOCYTES UA: NEGATIVE
NITRITE UA: NEGATIVE
Protein, UA: NEGATIVE
RBC UA: NEGATIVE
Specific Gravity, UA: 1.016 (ref 1.005–1.030)
Urobilinogen, Ur: 0.2 mg/dL (ref 0.2–1.0)
pH, UA: 5.5 (ref 5.0–7.5)

## 2018-07-23 LAB — MICROALBUMIN, URINE: Microalbumin, Urine: 47.1 ug/mL

## 2018-07-23 LAB — MICROSCOPIC EXAMINATION
Bacteria, UA: NONE SEEN
Casts: NONE SEEN /lpf
Epithelial Cells (non renal): NONE SEEN /hpf (ref 0–10)
RBC, UA: NONE SEEN /hpf (ref 0–2)

## 2018-08-04 ENCOUNTER — Telehealth: Payer: Self-pay | Admitting: Nurse Practitioner

## 2018-08-04 NOTE — Telephone Encounter (Signed)
MAILED LETTER TO Kaj Arreaga CONCERNING TRASH [email or web address]

## 2018-08-08 ENCOUNTER — Other Ambulatory Visit: Payer: Self-pay | Admitting: *Deleted

## 2018-08-08 NOTE — Patient Outreach (Signed)
[facility]) Care Management  08/08/2018   Lawrence Steele [birth year] [id number]  RN Health Coach telephone call to patient.  [employer] compliance verified. Per patient his fasting blood sugar was 202. Per patient he has been a little off his diet. Patient stated that he has been eating sweets that his daughter has cooked. Patient stated that he is having a problem eating small portions of desserts. Patient is checking blood sugars 2x/day  Instead of 3-4x/day. Per patient he runs out of strips. Patient has a routine exercise program of walking 30 minutes each morning. He has gotten his flu shot for the year. Patient has agreed to further outreach calls.   Current Medications:  Current Outpatient Medications  Medication Sig Dispense Refill  . ACCU-CHEK SOFTCLIX LANCETS lancets Use as instructed  Once a daily E11.65 100 each 1  . acetaminophen (TYLENOL) 500 MG tablet Take 500 mg by mouth every 6 (six) hours as needed.     Marland Kitchen albuterol (PROAIR HFA) 108 (90 Base) MCG/ACT inhaler Inhale 2 puffs into the lungs every 6 (six) hours as needed. 3 Inhaler 3  . allopurinol (ZYLOPRIM) 100 MG tablet Take 1 tablet (100 mg total) by mouth 2 (two) times daily. 180 tablet 1  . amoxicillin-clavulanate (AUGMENTIN) 875-125 MG tablet Take 1 tablet by mouth 2 (two) times daily. 20 tablet 0  . apixaban (ELIQUIS) 5 MG TABS tablet Take 1 tablet (5 mg total) by mouth 2 (two) times daily. 60 tablet 3  . atenolol (TENORMIN) 50 MG tablet Take 1 tablet (50 mg total) by mouth daily. 90 tablet 3  . atorvastatin (LIPITOR) 20 MG tablet Take 1 tablet (20 mg total) by mouth daily. 30 tablet 11  . Blood Glucose Monitoring Suppl (ACCU-CHEK AVIVA PLUS) w/Device KIT Use once daily to check blood sugar. 1 kit 3  . cyclobenzaprine (FLEXERIL) 10 MG tablet Take 1 tablet (10 mg total) by mouth 2 (two) times daily as needed for muscle spasms. (Patient taking differently: Take 10 mg by mouth 2 (two) times daily. ) 60 tablet 2  .  diclofenac sodium (VOLTAREN) 1 % GEL Apply 2 g topically 4 (four) times daily.     Marland Kitchen doxycycline (VIBRA-TABS) 100 MG tablet Take 1 tablet (100 mg total) by mouth daily. For acne. 30 tablet 4  . escitalopram (LEXAPRO) 5 MG tablet Take 1 tablet (5 mg total) by mouth daily. 30 tablet 3  . Fluticasone-Salmeterol (ADVAIR DISKUS) 250-50 MCG/DOSE AEPB Inhale 1 puff into the lungs 2 (two) times daily. Frequency:BID   Dosage:0.0     Instructions:  Note:Dose: 250-50MCG 3 each 3  . gabapentin (NEURONTIN) 300 MG capsule 400 mg 2 (two) times daily.     Marland Kitchen glimepiride (AMARYL) 1 MG tablet Take 2 tablets (2 mg total) by mouth daily with supper. 180 tablet 1  . Glucose Blood (ACCU-CHEK AVIVA PLUS VI) by In Vitro route. Pt needs accu check Aviva plus lancets, test strips and meter to check blood sugars once daily    . glucose blood (ACCU-CHEK AVIVA PLUS) test strip 1 each by Other route as needed for other. Use as instructed 100 each 3  . HYDROcodone-acetaminophen (NORCO) 5-325 MG tablet Take 1 tablet by mouth 2 (two) times daily.     . hydroxypropyl methylcellulose / hypromellose (ISOPTO TEARS / GONIOVISC) 2.5 % ophthalmic solution Place 1 drop into both eyes daily as needed for dry eyes.    Marland Kitchen ipratropium (ATROVENT) 0.02 % nebulizer solution Use every 6 hours for  Shortness of breath and wheezing and as needed.  Prescription is for 90 days 900 mL 1  . isosorbide mononitrate (IMDUR) 60 MG 24 hr tablet Take 1 tablet (60 mg total) by mouth daily. 90 tablet 1  . loratadine (CLARITIN) 10 MG tablet Take 10 mg by mouth daily.     . metFORMIN (GLUCOPHAGE) 500 MG tablet Take 1 tablet (500 mg total) by mouth 2 (two) times daily with a meal. 180 tablet 1  . nitroGLYCERIN (NITROSTAT) 0.4 MG SL tablet Place 0.4 mg under the tongue.    . OXYGEN Inhale 2 L into the lungs continuous.     . traZODone (DESYREL) 100 MG tablet Take 1 tablet (100 mg total) by mouth at bedtime. 30 tablet 3  . umeclidinium bromide (INCRUSE ELLIPTA) 62.5  MCG/INH AEPB Inhale 1 puff into the lungs daily. 3 each 3   No current facility-administered medications for this visit.     Functional Status:  In your present state of health, do you have any difficulty performing the following activities: 07/22/2018 07/09/2018  Hearing? N N  Vision? N N  Difficulty concentrating or making decisions? N N  Walking or climbing stairs? N Y  Dressing or bathing? N N  Doing errands, shopping? N Y  Conservation officer, nature and eating ? N N  Using the Toilet? N N  In the past six months, have you accidently leaked urine? N N  Do you have problems with loss of bowel control? N N  Managing your Medications? N N  Managing your Finances? N Y  Comment - -  Housekeeping or managing your Housekeeping? N Y  Some recent data might be hidden    Fall/Depression Screening: Fall Risk  07/22/2018 07/09/2018 06/19/2018  Falls in the past year? 0 Yes Yes  Number falls in past yr: - 1 1  Injury with Fall? - No No  Risk for fall due to : - History of fall(s);Impaired balance/gait;Impaired mobility -  Follow up - Falls evaluation completed -   PHQ 2/9 Scores 07/22/2018 07/09/2018 06/19/2018 04/22/2018 01/31/2018 01/30/2018 01/09/2018  PHQ - 2 Score 0 0 0 0 2 0 0  PHQ- 9 Score - - - - 4 - -   [facility] CM Care Plan Problem One     Most Recent Value  Care Plan Problem One  Knowledge Deficit in Self Management of Diabetes  Role Documenting the Problem One  [place] for Problem One  Active  [facility]N Long Term Goal   Patient will  see an increase in his from 7.2 within the next 90 days within the next 90 days  [facility]N Long Term Goal Start Date  08/08/18  [facility] CM Short Term Goal #1   Patient will be able to verbalize symptoms of hypo and hyperglycemia within the next 30 days  [facility] CM Short Term Goal #1 Start Date  08/08/18  Interventions for Short Term Goal #1  RN reiterate symptoms. RN willfollow up with further discussion  [facility] CM Short Term Goal #2   Patient will be able to identify  foods that make up a healthy snack within the next 30 days  [facility] CM Short Term Goal #2 Start Date  08/08/18  Interventions for Short Term Goal #2  RN reiterated healthy snacks. Patient has been eating cakes and sweets that daughter has been baking in moderation. Patient is seeing spike in blood sugar. RN will continue with further discussion and teach back  [facility] CM Short Term  Goal #3  Patient will check and document blood sugars as per physician order  [facility] CM Short Term Goal #3 Start Date  08/08/18  [facility] CM Short Term Goal #4  Patient will have a better understanding of serving size and portion control within the next 30 days  [facility] CM Short Term Goal #4 Start Date  08/08/18  Interventions for Short Term Goal #4  RN discussed with patient about eating smaller serving of desserts. Per patient he has a sweet tooth for desserts. RN will send additional educational material on serving sizes. RN will follow up with further discussion and teach back        Assessment:  Fasting blood sugar is 202 Patient is checking blood sugars 2x/day Patient is not adhering to diet Received Flu shot  Plan:  RN discussed fasting blood sugars RN discussed diet adherence RN discussed serving sizes RN sent educational material on serving sizes RN discussed maintaining exercise program. RN will follow up within the month of January    [employer] [place] Management [phone]

## 2018-08-11 ENCOUNTER — Other Ambulatory Visit: Payer: Self-pay

## 2018-08-12 ENCOUNTER — Other Ambulatory Visit: Payer: Self-pay | Admitting: Nurse Practitioner

## 2018-08-12 DIAGNOSIS — B370 Candidal stomatitis: Secondary | ICD-10-CM

## 2018-08-12 MED ORDER — NYSTATIN 100000 UNIT/ML MT SUSP: 5.0000 mL | Freq: Four times a day (QID) | OROMUCOSAL | 2 refills | Status: DC

## 2018-08-12 NOTE — Progress Notes (Signed)
Renewed oral nystatin rinse to use four times daily as needed. New rx sent to [place]

## 2018-09-03 ENCOUNTER — Other Ambulatory Visit: Payer: Self-pay

## 2018-09-03 ENCOUNTER — Inpatient Hospital Stay
Admission: EM | Admit: 2018-09-03 | Discharge: 2018-09-05 | DRG: 191 | Disposition: A | Discharge status: Home / Self Care | Payer: Medicare HMO | Attending: Internal Medicine | Admitting: Internal Medicine

## 2018-09-03 ENCOUNTER — Encounter: Payer: Self-pay | Admitting: Emergency Medicine

## 2018-09-03 ENCOUNTER — Emergency Department: Payer: Medicare HMO

## 2018-09-03 ENCOUNTER — Encounter: Payer: Self-pay | Admitting: Adult Health

## 2018-09-03 ENCOUNTER — Telehealth: Payer: Self-pay | Admitting: Cardiovascular Disease

## 2018-09-03 ENCOUNTER — Ambulatory Visit: Payer: Medicare HMO | Admitting: Adult Health

## 2018-09-03 VITALS — BP 108/80 | HR 141 | Temp 98.7°F | Resp 16 | Ht 70.0 in | Wt 261.0 lb

## 2018-09-03 DIAGNOSIS — E785 Hyperlipidemia, unspecified: Secondary | ICD-10-CM | POA: Diagnosis present

## 2018-09-03 DIAGNOSIS — R0902 Hypoxemia: Secondary | ICD-10-CM | POA: Diagnosis present

## 2018-09-03 DIAGNOSIS — I5030 Unspecified diastolic (congestive) heart failure: Secondary | ICD-10-CM | POA: Diagnosis present

## 2018-09-03 DIAGNOSIS — E1165 Type 2 diabetes mellitus with hyperglycemia: Secondary | ICD-10-CM | POA: Diagnosis present

## 2018-09-03 DIAGNOSIS — F17200 Nicotine dependence, unspecified, uncomplicated: Secondary | ICD-10-CM | POA: Diagnosis not present

## 2018-09-03 DIAGNOSIS — E1140 Type 2 diabetes mellitus with diabetic neuropathy, unspecified: Secondary | ICD-10-CM | POA: Diagnosis present

## 2018-09-03 DIAGNOSIS — Z9981 Dependence on supplemental oxygen: Secondary | ICD-10-CM | POA: Diagnosis not present

## 2018-09-03 DIAGNOSIS — J40 Bronchitis, not specified as acute or chronic: Secondary | ICD-10-CM | POA: Diagnosis present

## 2018-09-03 DIAGNOSIS — Z7901 Long term (current) use of anticoagulants: Secondary | ICD-10-CM

## 2018-09-03 DIAGNOSIS — I25118 Atherosclerotic heart disease of native coronary artery with other forms of angina pectoris: Secondary | ICD-10-CM | POA: Diagnosis present

## 2018-09-03 DIAGNOSIS — G4733 Obstructive sleep apnea (adult) (pediatric): Secondary | ICD-10-CM | POA: Diagnosis present

## 2018-09-03 DIAGNOSIS — I4892 Unspecified atrial flutter: Secondary | ICD-10-CM | POA: Diagnosis present

## 2018-09-03 DIAGNOSIS — F17210 Nicotine dependence, cigarettes, uncomplicated: Secondary | ICD-10-CM | POA: Diagnosis present

## 2018-09-03 DIAGNOSIS — I723 Aneurysm of iliac artery: Secondary | ICD-10-CM | POA: Diagnosis present

## 2018-09-03 DIAGNOSIS — Z792 Long term (current) use of antibiotics: Secondary | ICD-10-CM

## 2018-09-03 DIAGNOSIS — I714 Abdominal aortic aneurysm, without rupture: Secondary | ICD-10-CM | POA: Diagnosis present

## 2018-09-03 DIAGNOSIS — Z791 Long term (current) use of non-steroidal anti-inflammatories (NSAID): Secondary | ICD-10-CM

## 2018-09-03 DIAGNOSIS — Z7984 Long term (current) use of oral hypoglycemic drugs: Secondary | ICD-10-CM

## 2018-09-03 DIAGNOSIS — Z79891 Long term (current) use of opiate analgesic: Secondary | ICD-10-CM

## 2018-09-03 DIAGNOSIS — M545 Low back pain: Secondary | ICD-10-CM | POA: Diagnosis present

## 2018-09-03 DIAGNOSIS — J440 Chronic obstructive pulmonary disease with acute lower respiratory infection: Secondary | ICD-10-CM | POA: Diagnosis present

## 2018-09-03 DIAGNOSIS — I483 Typical atrial flutter: Secondary | ICD-10-CM | POA: Diagnosis not present

## 2018-09-03 DIAGNOSIS — Z9181 History of falling: Secondary | ICD-10-CM | POA: Diagnosis not present

## 2018-09-03 DIAGNOSIS — Z9861 Coronary angioplasty status: Secondary | ICD-10-CM

## 2018-09-03 DIAGNOSIS — Z79899 Other long term (current) drug therapy: Secondary | ICD-10-CM

## 2018-09-03 DIAGNOSIS — J069 Acute upper respiratory infection, unspecified: Secondary | ICD-10-CM

## 2018-09-03 DIAGNOSIS — R0603 Acute respiratory distress: Secondary | ICD-10-CM | POA: Diagnosis not present

## 2018-09-03 DIAGNOSIS — I482 Chronic atrial fibrillation, unspecified: Secondary | ICD-10-CM

## 2018-09-03 DIAGNOSIS — I110 Hypertensive heart disease with heart failure: Secondary | ICD-10-CM | POA: Diagnosis present

## 2018-09-03 DIAGNOSIS — Z885 Allergy status to narcotic agent status: Secondary | ICD-10-CM

## 2018-09-03 DIAGNOSIS — J441 Chronic obstructive pulmonary disease with (acute) exacerbation: Secondary | ICD-10-CM | POA: Diagnosis present

## 2018-09-03 DIAGNOSIS — Z7951 Long term (current) use of inhaled steroids: Secondary | ICD-10-CM

## 2018-09-03 DIAGNOSIS — Z6837 Body mass index (BMI) 37.0-37.9, adult: Secondary | ICD-10-CM | POA: Diagnosis not present

## 2018-09-03 LAB — BASIC METABOLIC PANEL
Anion gap: 10 (ref 5–15)
BUN: 23 mg/dL (ref 8–23)
CALCIUM: 8.7 mg/dL — AB (ref 8.9–10.3)
CO2: 23 mmol/L (ref 22–32)
Chloride: 102 mmol/L (ref 98–111)
Creatinine, Ser: 0.97 mg/dL (ref 0.61–1.24)
GFR calc Af Amer: >60 mL/min (ref >60)
GFR calc non Af Amer: >60 mL/min (ref >60)
Glucose, Bld: 376 mg/dL — ABNORMAL HIGH (ref 70–99)
Potassium: 4.3 mmol/L (ref 3.5–5.1)
Sodium: 135 mmol/L (ref 135–145)

## 2018-09-03 LAB — CBC
HEMATOCRIT: 41.3 % (ref 39.0–52.0)
Hemoglobin: 13.8 g/dL (ref 13.0–17.0)
MCH: 34.9 pg — ABNORMAL HIGH (ref 26.0–34.0)
MCHC: 33.4 g/dL (ref 30.0–36.0)
MCV: 104.6 fL — AB (ref 80.0–100.0)
Platelets: 122 10*3/uL — ABNORMAL LOW (ref 150–400)
RBC: 3.95 MIL/uL — ABNORMAL LOW (ref 4.22–5.81)
RDW: 15.8 % — ABNORMAL HIGH (ref 11.5–15.5)
WBC: 9 10*3/uL (ref 4.0–10.5)
nRBC: 0 % (ref 0.0–0.2)

## 2018-09-03 LAB — TROPONIN I: Troponin I: <0.03 ng/mL (ref <0.03)

## 2018-09-03 LAB — GLUCOSE, CAPILLARY: Glucose-Capillary: 330 mg/dL — ABNORMAL HIGH (ref 70–99)

## 2018-09-03 MED ORDER — METHYLPREDNISOLONE SODIUM SUCC 125 MG IJ SOLR
60.0000 mg | Freq: Three times a day (TID) | INTRAMUSCULAR | Status: DC
  Administered 2018-09-03 – 2018-09-05 (×6): 60 mg via INTRAVENOUS
  Filled 2018-09-03 (×6): qty 2

## 2018-09-03 MED ORDER — DILTIAZEM HCL 25 MG/5ML IV SOLN
5.0000 mg | Freq: Once | INTRAVENOUS | Status: AC
  Administered 2018-09-03: 5 mg via INTRAVENOUS
  Filled 2018-09-03: qty 5

## 2018-09-03 MED ORDER — TRAZODONE HCL 100 MG PO TABS
100.0000 mg | ORAL_TABLET | Freq: Every day | ORAL | Status: DC
  Administered 2018-09-03 – 2018-09-04 (×2): 100 mg via ORAL
  Filled 2018-09-03 (×2): qty 1

## 2018-09-03 MED ORDER — INSULIN ASPART 100 UNIT/ML ~~LOC~~ SOLN
0.0000 [IU] | Freq: Three times a day (TID) | SUBCUTANEOUS | Status: DC
  Administered 2018-09-04 (×2): 8 [IU] via SUBCUTANEOUS
  Filled 2018-09-03 (×2): qty 1

## 2018-09-03 MED ORDER — BUDESONIDE 0.25 MG/2ML IN SUSP
0.2500 mg | Freq: Two times a day (BID) | RESPIRATORY_TRACT | Status: DC
  Administered 2018-09-03 – 2018-09-05 (×4): 0.25 mg via RESPIRATORY_TRACT
  Filled 2018-09-03 (×4): qty 2

## 2018-09-03 MED ORDER — ALLOPURINOL 100 MG PO TABS
100.0000 mg | ORAL_TABLET | Freq: Two times a day (BID) | ORAL | Status: DC
  Administered 2018-09-03 – 2018-09-05 (×3): 100 mg via ORAL
  Filled 2018-09-03 (×5): qty 1

## 2018-09-03 MED ORDER — PREDNISONE 20 MG PO TABS
40.0000 mg | ORAL_TABLET | Freq: Once | ORAL | Status: AC
  Administered 2018-09-03: 40 mg via ORAL
  Filled 2018-09-03: qty 2

## 2018-09-03 MED ORDER — LORATADINE 10 MG PO TABS
10.0000 mg | ORAL_TABLET | Freq: Every day | ORAL | Status: DC
  Administered 2018-09-04 – 2018-09-05 (×2): 10 mg via ORAL
  Filled 2018-09-03 (×2): qty 1

## 2018-09-03 MED ORDER — ONDANSETRON HCL 4 MG PO TABS: 4.0000 mg | ORAL_TABLET | Freq: Four times a day (QID) | ORAL | Status: DC | PRN

## 2018-09-03 MED ORDER — PREDNISONE 10 MG PO TABS: ORAL_TABLET | ORAL | 0 refills | Status: DC

## 2018-09-03 MED ORDER — LEVALBUTEROL HCL 1.25 MG/0.5ML IN NEBU
1.2500 mg | INHALATION_SOLUTION | Freq: Once | RESPIRATORY_TRACT | Status: AC
  Administered 2018-09-03: 1.25 mg via RESPIRATORY_TRACT
  Filled 2018-09-03: qty 0.5

## 2018-09-03 MED ORDER — ATORVASTATIN CALCIUM 20 MG PO TABS
20.0000 mg | ORAL_TABLET | Freq: Every day | ORAL | Status: DC
  Administered 2018-09-04 – 2018-09-05 (×2): 20 mg via ORAL
  Filled 2018-09-03 (×2): qty 1

## 2018-09-03 MED ORDER — LEVALBUTEROL HCL 0.63 MG/3ML IN NEBU
0.6300 mg | INHALATION_SOLUTION | Freq: Four times a day (QID) | RESPIRATORY_TRACT | Status: DC
  Administered 2018-09-03 – 2018-09-05 (×5): 0.63 mg via RESPIRATORY_TRACT
  Filled 2018-09-03 (×6): qty 3

## 2018-09-03 MED ORDER — APIXABAN 5 MG PO TABS
5.0000 mg | ORAL_TABLET | Freq: Two times a day (BID) | ORAL | Status: DC
  Administered 2018-09-04 – 2018-09-05 (×2): 5 mg via ORAL
  Filled 2018-09-03 (×2): qty 1

## 2018-09-03 MED ORDER — HYDROCODONE-ACETAMINOPHEN 5-325 MG PO TABS
1.0000 | ORAL_TABLET | ORAL | Status: DC | PRN
  Administered 2018-09-04: 2 via ORAL
  Administered 2018-09-05: 1 via ORAL
  Filled 2018-09-03: qty 2
  Filled 2018-09-03: qty 1

## 2018-09-03 MED ORDER — SODIUM CHLORIDE 0.9% FLUSH
3.0000 mL | Freq: Two times a day (BID) | INTRAVENOUS | Status: DC
  Administered 2018-09-03 – 2018-09-05 (×3): 3 mL via INTRAVENOUS

## 2018-09-03 MED ORDER — DOXYCYCLINE HYCLATE 100 MG PO TABS
100.0000 mg | ORAL_TABLET | Freq: Two times a day (BID) | ORAL | Status: DC
  Administered 2018-09-03 – 2018-09-05 (×3): 100 mg via ORAL
  Filled 2018-09-03 (×3): qty 1

## 2018-09-03 MED ORDER — IPRATROPIUM BROMIDE 0.02 % IN SOLN
0.5000 mg | Freq: Four times a day (QID) | RESPIRATORY_TRACT | Status: DC
  Administered 2018-09-03 – 2018-09-05 (×6): 0.5 mg via RESPIRATORY_TRACT
  Filled 2018-09-03 (×6): qty 2.5

## 2018-09-03 MED ORDER — ATENOLOL 50 MG PO TABS
50.0000 mg | ORAL_TABLET | Freq: Every day | ORAL | Status: DC
  Administered 2018-09-04 – 2018-09-05 (×2): 50 mg via ORAL
  Filled 2018-09-03 (×2): qty 2

## 2018-09-03 MED ORDER — SODIUM CHLORIDE 0.9 % IV BOLUS
250.0000 mL | Freq: Once | INTRAVENOUS | Status: AC
  Administered 2018-09-03: 250 mL via INTRAVENOUS

## 2018-09-03 MED ORDER — CYCLOBENZAPRINE HCL 10 MG PO TABS
10.0000 mg | ORAL_TABLET | Freq: Two times a day (BID) | ORAL | Status: DC | PRN
  Administered 2018-09-03: 10 mg via ORAL
  Filled 2018-09-03: qty 1

## 2018-09-03 MED ORDER — GABAPENTIN 400 MG PO CAPS
400.0000 mg | ORAL_CAPSULE | Freq: Three times a day (TID) | ORAL | Status: DC
  Administered 2018-09-03 – 2018-09-05 (×5): 400 mg via ORAL
  Filled 2018-09-03 (×5): qty 1

## 2018-09-03 MED ORDER — TRAZODONE HCL 50 MG PO TABS: 25.0000 mg | ORAL_TABLET | Freq: Every evening | ORAL | Status: DC | PRN

## 2018-09-03 MED ORDER — OXYCODONE-ACETAMINOPHEN 7.5-325 MG PO TABS
1.0000 | ORAL_TABLET | Freq: Three times a day (TID) | ORAL | Status: DC | PRN
  Administered 2018-09-04: 1 via ORAL
  Filled 2018-09-03: qty 1

## 2018-09-03 MED ORDER — APIXABAN 5 MG PO TABS: 5.0000 mg | ORAL_TABLET | Freq: Two times a day (BID) | ORAL | Status: DC

## 2018-09-03 MED ORDER — BISACODYL 5 MG PO TBEC: 5.0000 mg | DELAYED_RELEASE_TABLET | Freq: Every day | ORAL | Status: DC | PRN

## 2018-09-03 MED ORDER — ISOSORBIDE MONONITRATE ER 60 MG PO TB24
60.0000 mg | ORAL_TABLET | Freq: Every day | ORAL | Status: DC
  Administered 2018-09-04 – 2018-09-05 (×2): 60 mg via ORAL
  Filled 2018-09-03 (×2): qty 1

## 2018-09-03 MED ORDER — LEVALBUTEROL HCL 0.63 MG/3ML IN NEBU: 0.6300 mg | INHALATION_SOLUTION | RESPIRATORY_TRACT | Status: DC | PRN

## 2018-09-03 MED ORDER — ACETAMINOPHEN 500 MG PO TABS: 500.0000 mg | ORAL_TABLET | Freq: Four times a day (QID) | ORAL | Status: DC | PRN

## 2018-09-03 MED ORDER — ONDANSETRON HCL 4 MG/2ML IJ SOLN: 4.0000 mg | Freq: Four times a day (QID) | INTRAMUSCULAR | Status: DC | PRN

## 2018-09-03 MED ORDER — ACETAMINOPHEN 325 MG PO TABS: 650.0000 mg | ORAL_TABLET | Freq: Four times a day (QID) | ORAL | Status: DC | PRN

## 2018-09-03 MED ORDER — AZITHROMYCIN 250 MG PO TABS: ORAL_TABLET | ORAL | 0 refills | Status: DC

## 2018-09-03 MED ORDER — DOXYCYCLINE HYCLATE 100 MG PO TABS: 100.0000 mg | ORAL_TABLET | Freq: Every day | ORAL | 4 refills | Status: DC

## 2018-09-03 MED ORDER — IPRATROPIUM BROMIDE 0.02 % IN SOLN: 0.5000 mg | RESPIRATORY_TRACT | Status: DC | PRN

## 2018-09-03 MED ORDER — NITROGLYCERIN 0.4 MG SL SUBL: 0.4000 mg | SUBLINGUAL_TABLET | SUBLINGUAL | Status: DC | PRN

## 2018-09-03 MED ORDER — ESCITALOPRAM OXALATE 10 MG PO TABS
5.0000 mg | ORAL_TABLET | Freq: Every day | ORAL | Status: DC
  Administered 2018-09-04 – 2018-09-05 (×2): 5 mg via ORAL
  Filled 2018-09-03 (×3): qty 0.5

## 2018-09-03 MED ORDER — ACETAMINOPHEN 650 MG RE SUPP: 650.0000 mg | Freq: Four times a day (QID) | RECTAL | Status: DC | PRN

## 2018-09-03 MED ORDER — DOCUSATE SODIUM 100 MG PO CAPS
100.0000 mg | ORAL_CAPSULE | Freq: Two times a day (BID) | ORAL | Status: DC
  Administered 2018-09-03 – 2018-09-05 (×3): 100 mg via ORAL
  Filled 2018-09-03 (×3): qty 1

## 2018-09-03 NOTE — Telephone Encounter (Signed)
STAT if HR is under 50 or over 120 (normal HR is 60-100 beats per minute)  1) What is your heart rate? 140, last Saturday it was 160 - been high all week   2) Do you have a log of your heart rate readings (document readings)?   3) Do you have any other symptoms? Been sick with a cold - cold chills, hot chills.  Just came from lung doctor, was prescribed some medication and was told to get in touch with cardio.

## 2018-09-03 NOTE — Telephone Encounter (Signed)
Pt reports tachycardia 140s-160s since sat night, he denies SOB, chest pain, or weakness. Is on home o2 of 2 L. Has cold like sx that started over the weekend.  He took thera flu 1 dose on Monday, but hasn't taken any since or any other cold medications.  He saw Pulm this morning and was prescribed abx and steroids which he hasn't started yet.  His vitals in clinic were BP 108/80,  Pulse 141.    Has hx of aflutter with conversion on 04/24/18.  I took information to provider on duty, Ignacia Bayley NP; his advice was for pt to been seen in the ED for further work up.    I called pt back to discuss POC. He verbalized understanding.  He said he has a ride to the hospital.    Advised pt to call for any further questions or concerns  Call routed to primary Cardiologist Rockey Situ, as well as provider on duty Ignacia Bayley, NP.

## 2018-09-03 NOTE — H&P (Signed)
[facility] NAME: Lawrence Steele    MR#:  [id number]  DATE OF BIRTH:  1950-11-25  DATE OF ADMISSION:  09/03/2018  PRIMARY CARE PHYSICIAN: Ronnell Freshwater, NP   REQUESTING/REFERRING PHYSICIAN: ER  CHIEF COMPLAINT:   Chief Complaint  Patient presents with  . Tachycardia    HISTORY OF PRESENT ILLNESS:  Lawrence Steele  is a 67 y.o. male with a known history listed below presented to emergency room for evaluation of palpitation and shortness of breath.  Patient has palpitation on and off since last 5 days.  Patient also noticed shortness of breath with a dry cough.  He denies chest pain or tightness.  No dizziness or syncope.  He feels congested and wheezy.  He went to pulmonary clinic today and heart rate was elevated.  Patient transferred to emergency room for further evaluation and treatment.  Patient denies fever or chills.  No other complaints.  In emergency room patient heart rate was in 140s.  Patient is in a flutter.  ER physician has discussed case with cardiology who recommended n.p.o. after midnight for possible cardioversion in the morning.  Patient received prednisone and breathing treatment in the emergency room.  Hospitalist team requested for admission.  PAST MEDICAL HISTORY:   Past Medical History:  Diagnosis Date  . AAA (abdominal aortic aneurysm) ([place])   . AAA (abdominal aortic aneurysm) ([place])   . Anxiety   . Asthma   . CHF (congestive heart failure) ([place])   . COPD (chronic obstructive pulmonary disease) ([place])   . Coronary artery disease   . Diabetes mellitus without complication ([place])   . ESRD (end stage renal disease) ([place])   . Hyperlipidemia   . Hypertension   . Iliac artery aneurysm, bilateral ([place])   . Obstructive sleep apnea-hypopnea syndrome   . Sleep apnea     PAST SURGICAL HISTORY:   Past Surgical History:  Procedure Laterality Date  . ABDOMINAL AORTIC ANEURYSM REPAIR  03/06/2015   [place]    . APPENDECTOMY    . CARDIAC CATHETERIZATION  2010,03/16/2003,11/04/2002  . CARDIOVERSION N/A 04/24/2018   Procedure: CARDIOVERSION;  Surgeon: Nelva Bush, MD;  Location: [facility] ORS;  Service: Cardiovascular;  Laterality: N/A;  . CORONARY ANGIOPLASTY  03/16/2003   stent to the RCA  & 2/18/2004stent mid circumflex  . ELBOW SURGERY    . HERNIA REPAIR    . NASAL SINUS SURGERY    . PENILE PROSTHESIS IMPLANT  1995  . PILONIDAL CYST EXCISION    . TEE WITHOUT CARDIOVERSION N/A 04/24/2018   Procedure: TRANSESOPHAGEAL ECHOCARDIOGRAM (TEE);  Surgeon: Nelva Bush, MD;  Location: [facility] ORS;  Service: Cardiovascular;  Laterality: N/A;  . TONSILECTOMY, ADENOIDECTOMY, BILATERAL MYRINGOTOMY AND TUBES    . UMBILICAL HERNIA REPAIR      SOCIAL HISTORY:   Social History   Tobacco Use  . Smoking status: Current Every Day Smoker    Packs/day: 0.25    Years: 45.00    Pack years: 11.25    Types: Cigarettes  . Smokeless tobacco: Former Systems developer    Types: Chew  . Tobacco comment: patient refused couseling information  Substance Use Topics  . Alcohol use: Not Currently    FAMILY HISTORY:   Family History  Problem Relation Age of Onset  . Hypertension Mother   . Hyperlipidemia Mother   . Depression Mother   . Heart attack Father 36  . Heart disease Father   .  Hypertension Father   . Hyperlipidemia Father     DRUG ALLERGIES:   Allergies  Allergen Reactions  . Codeine Itching    Pt reports tolerating oral opioids.    REVIEW OF SYSTEMS:   ROS  MEDICATIONS AT HOME:   Prior to Admission medications   Medication Sig Start Date End Date Taking? Authorizing Provider  acetaminophen (TYLENOL) 500 MG tablet Take 500 mg by mouth every 6 (six) hours as needed.    Yes [provider]  albuterol (PROAIR HFA) 108 (90 Base) MCG/ACT inhaler Inhale 2 puffs into the lungs every 6 (six) hours as needed. 02/05/18  Yes Lavera Guise, MD  allopurinol (ZYLOPRIM) 100 MG tablet  Take 1 tablet (100 mg total) by mouth 2 (two) times daily. 12/02/17  Yes Boscia, Greer Ee, NP  apixaban (ELIQUIS) 5 MG TABS tablet Take 1 tablet (5 mg total) by mouth 2 (two) times daily. 04/09/18  Yes Minna Merritts, MD  atenolol (TENORMIN) 50 MG tablet Take 1 tablet (50 mg total) by mouth daily. 05/23/18  Yes Minna Merritts, MD  atorvastatin (LIPITOR) 20 MG tablet Take 1 tablet (20 mg total) by mouth daily. 10/15/17  Yes Minna Merritts, MD  azithromycin (ZITHROMAX) 250 MG tablet Take as directed. 09/03/18  Yes Scarboro, Audie Clear, NP  Blood Glucose Monitoring Suppl (ACCU-CHEK AVIVA PLUS) w/Device KIT Use once daily to check blood sugar. 01/24/18  Yes Boscia, Greer Ee, NP  cyclobenzaprine (FLEXERIL) 10 MG tablet Take 1 tablet (10 mg total) by mouth 2 (two) times daily as needed for muscle spasms. Patient taking differently: 10 mg 2 (two) times daily.  04/11/18  Yes Boscia, Greer Ee, NP  diclofenac sodium (VOLTAREN) 1 % GEL Apply 2 g topically 4 (four) times daily.    Yes [provider]  doxycycline (VIBRA-TABS) 100 MG tablet Take 1 tablet (100 mg total) by mouth daily. For acne. 09/03/18  Yes Boscia, Heather E, NP  Fluticasone-Salmeterol (ADVAIR DISKUS) 250-50 MCG/DOSE AEPB Inhale 1 puff into the lungs 2 (two) times daily. Frequency:BID   Dosage:0.0     Instructions:  Note:Dose: 250-50MCG 02/05/18  Yes Lavera Guise, MD  gabapentin (NEURONTIN) 400 MG capsule Take 400 mg by mouth 3 (three) times daily.    Yes [provider]  glimepiride (AMARYL) 1 MG tablet Take 2 tablets (2 mg total) by mouth daily with supper. Patient taking differently: Take 2 mg by mouth 2 (two) times daily with a meal.  02/03/18  Yes Boscia, Heather E, NP  Glucose Blood (ACCU-CHEK AVIVA PLUS VI) by In Vitro route. Pt needs accu check Aviva plus lancets, test strips and meter to check blood sugars once daily   Yes [provider]  glucose blood (ACCU-CHEK AVIVA PLUS) test strip 1 each by Other route as  needed for other. Use as instructed 01/24/18  Yes Boscia, Heather E, NP  hydroxypropyl methylcellulose / hypromellose (ISOPTO TEARS / GONIOVISC) 2.5 % ophthalmic solution Place 1 drop into both eyes daily as needed for dry eyes.   Yes [provider]  ipratropium (ATROVENT) 0.02 % nebulizer solution Use every 6 hours for Shortness of breath and wheezing and as needed.  Prescription is for 90 days 10/02/17  Yes Ronnell Freshwater, NP  isosorbide mononitrate (IMDUR) 60 MG 24 hr tablet Take 1 tablet (60 mg total) by mouth daily. 06/09/18  Yes Boscia, Greer Ee, NP  loratadine (CLARITIN) 10 MG tablet Take 10 mg by mouth daily.    Yes  [provider]  meloxicam (MOBIC) 7.5 MG tablet Take 7.5 mg by mouth 2 (two) times daily. 08/01/18  Yes [provider]  metFORMIN (GLUCOPHAGE) 500 MG tablet Take 1 tablet (500 mg total) by mouth 2 (two) times daily with a meal. 06/09/18  Yes Boscia, Heather E, NP  nitroGLYCERIN (NITROSTAT) 0.4 MG SL tablet Place 0.4 mg under the tongue.   Yes [provider]  nystatin (MYCOSTATIN) 100000 UNIT/ML suspension Take 5 mLs (500,000 Units total) by mouth 4 (four) times daily. 08/12/18  Yes Boscia, Greer Ee, NP  oxyCODONE-acetaminophen (PERCOCET) 7.5-325 MG tablet Take 1 tablet by mouth 3 (three) times daily.   Yes [provider]  OXYGEN Inhale 2 L into the lungs continuous.    Yes [provider]  traZODone (DESYREL) 100 MG tablet Take 1 tablet (100 mg total) by mouth at bedtime. 07/01/18  Yes Boscia, Heather E, NP  umeclidinium bromide (INCRUSE ELLIPTA) 62.5 MCG/INH AEPB Inhale 1 puff into the lungs daily. 02/05/18  Yes Lavera Guise, MD  ACCU-CHEK [facility] LANCETS lancets Use as instructed  Once a daily E11.65 01/30/18   Ronnell Freshwater, NP  escitalopram (LEXAPRO) 5 MG tablet Take 1 tablet (5 mg total) by mouth daily. Patient not taking: Reported on 09/03/2018 05/09/18   Ronnell Freshwater, NP  HYDROcodone-acetaminophen (NORCO)  5-325 MG tablet Take 1 tablet by mouth 2 (two) times daily.     [provider]  predniSONE (DELTASONE) 10 MG tablet Use per dose pack 09/03/18   Scarboro, Audie Clear, NP      VITAL SIGNS:  Blood pressure 108/74, pulse (!) 138, temperature 98.5 F (36.9 C), temperature source Oral, resp. rate 19, height _0  (1.778 m), weight 117.9 kg, SpO2 95 %.  PHYSICAL EXAMINATION:  Physical Exam  GENERAL:  67 y.o.-year-old patient lying in the bed with no acute distress.  EYES: Pupils equal, round, reactive to light and accommodation. No scleral icterus. Extraocular muscles intact.  HEENT: Head atraumatic, normocephalic. Oropharynx and nasopharynx clear.  NECK:  Supple, no jugular venous distention. No thyroid enlargement, no tenderness.  LUNGS: Bilateral expiratory wheezing CARDIOVASCULAR: S1, S2 normal.  Irregularly irregular tachycardia. no murmurs, rubs, or gallops.  ABDOMEN: Soft, nontender, nondistended. Bowel sounds present. No organomegaly or mass.  EXTREMITIES: No pedal edema, cyanosis, or clubbing.  NEUROLOGIC: Cranial nerves II through XII are intact. Muscle strength 5/5 in all extremities. Sensation intact. Gait not checked.  PSYCHIATRIC: The patient is alert and oriented x 3.  SKIN: No obvious rash, lesion, or ulcer.   LABORATORY PANEL:   CBC Recent Labs  Lab 09/03/18 1707  WBC 9.0  HGB 13.8  HCT 41.3  PLT 122*   ------------------------------------------------------------------------------------------------------------------  Chemistries  Recent Labs  Lab 09/03/18 1707  NA 135  K 4.3  CL 102  CO2 23  GLUCOSE 376*  BUN 23  CREATININE 0.97  CALCIUM 8.7*   ------------------------------------------------------------------------------------------------------------------  Cardiac Enzymes Recent Labs  Lab 09/03/18 1707  TROPONINI <0.03    ------------------------------------------------------------------------------------------------------------------  RADIOLOGY:  Dg Chest 2 View  Result Date: 09/03/2018 CLINICAL DATA:  Dry cough and dyspnea x5 days EXAM: CHEST - 2 VIEW COMPARISON:  CXR 04/11/2018 FINDINGS: Stable cardiomegaly with mild uncoiling of the thoracic aorta. No thoracic aortic aneurysm. There is atelectasis at the left lung base. Remote left-sided rib fractures are noted. No acute osseous abnormality is seen. Mild degenerative change is noted along the dorsal spine. Pulmonary vasculature is unremarkable. IMPRESSION: No active cardiopulmonary disease.  Stable mild  cardiomegaly. Electronically Signed   By: Ashley Royalty M.D.   On: 09/03/2018 18:09      IMPRESSION AND PLAN:   1.  Acute on chronic COPD exacerbation with bronchitis: Patient started on IV Solu-Medrol, doxycycline, breathing treatment and oxygen.  Adjust medication based on clinical course.  2.  A flutter: Rapid ventricular rate.  Cardiology team notified.  Plan to continue anticoagulation and possible cardioversion in the morning.  Keep patient n.p.o. after midnight.  In emergency room patient has received diltiazem that has caused blood pressure to drop.  Cardiology has not recommended any further rate controlling agents.  3.  Diabetes with hyperglycemia: Patient will be n.p.o. after midnight.  Sliding scale insulin.  Monitor CBGs closely.  Adjust insulin as indicated.  4.  Chronic other medical problems: Monitor.  Continue home medication as ordered  DVT prophylaxis: On full dose of anticoagulation with apixaban  Estimated length of stay more than 2 midnight  Moderate to high risk secondary to above.  Case discussed with patient in detail.  Further treatment based on clinical course.    All the records are reviewed and case discussed with ED provider.      CODE STATUS: Full      Sedalia Muta M.D on 09/03/2018 at 8:16 PM  Between 7am  to 6pm - Pager - [phone]  After 6pm go to [email or web address] - [employer] [place] [facility]  Office  [phone]  CC: Primary care physician; Ronnell Freshwater, NP

## 2018-09-03 NOTE — Consult Note (Signed)
ANTICOAGULATION CONSULT NOTE - Follow Up Consult  Pharmacy Consult for apixaban Indication: atrial fibrillation  Allergies  Allergen Reactions  . Codeine Itching    Pt reports tolerating oral opioids.    Patient Measurements: Height: 5\' 10"  (177.8 cm) Weight: 260 lb (117.9 kg) IBW/kg (Calculated) : 73 Heparin Dosing Weight:   Vital Signs: Temp: 98.5 F (36.9 C) (12/18 1644) Temp Source: Oral (12/18 1644) BP: 99/63 (12/18 1833) Pulse Rate: 139 (12/18 1834)  Labs: Recent Labs    09/03/18 1707  HGB 13.8  HCT 41.3  PLT 122*  CREATININE 0.97  TROPONINI <0.03    Estimated Creatinine Clearance: 95.1 mL/min (by C-G formula based on SCr of 0.97 mg/dL).   Medications:  (Not in a hospital admission)   Assessment: 67 y.o. male history of COPD and multiple other medical conditions including atrial flutter. Patient reports for about a week now he had a cough some slight congestion and some wheezing, feels like he has had his COPD causing trouble for him when he went to his pulmonologist today for evaluation.  He noticed his heart rate was quite high and referred him here for evaluation after discussion with cardiology.  PTA patient on apixaban 5 mg twice a day for a.fib.  Patient stated they took their first dose about 0600 and then then their second dose around 1300 today.  Patient dose not meet any criteria for renal dose adjustment.  Goal of Therapy:  Monitor platelets by anticoagulation protocol: Yes   Plan:  Will order apixaban 5 mg twice daily starting on 12/19. Will monitor CBC every 3 days as per protocol.  Forrest Moron, PharmD Clinical Pharmacist 09/03/2018,6:41 PM

## 2018-09-03 NOTE — Progress Notes (Signed)
[facility] [place],  [id number]  Internal MEDICINE  Office Visit Note  Patient Name: Lawrence Steele  [id number]  [id number]  Date of Service: 09/06/2018  Chief Complaint  Patient presents with  . Sleep Apnea    needs new cpap,   . Cough  . Sinusitis     HPI Pt is here for a sick visit. Pt reports sinus pain and pressure with postnasal drip.  He also reports cough and generally feeling bad for approximately 1 week.  He has been using over-the-counter medications with mild relief.  He is unsure if he had a fever however he did have chills periodically.  Patient is also requesting a written prescription for CPAP machine as he has found a company where he can obtain a CPAP machine that is affordable for him.    Current Medication:  Outpatient Encounter Medications as of 09/03/2018  Medication Sig Note  . ACCU-CHEK SOFTCLIX LANCETS lancets Use as instructed  Once a daily E11.65   . acetaminophen (TYLENOL) 500 MG tablet Take 500 mg by mouth every 6 (six) hours as needed.    Marland Kitchen albuterol (PROAIR HFA) 108 (90 Base) MCG/ACT inhaler Inhale 2 puffs into the lungs every 6 (six) hours as needed.   Marland Kitchen allopurinol (ZYLOPRIM) 100 MG tablet Take 1 tablet (100 mg total) by mouth 2 (two) times daily.   Marland Kitchen apixaban (ELIQUIS) 5 MG TABS tablet Take 1 tablet (5 mg total) by mouth 2 (two) times daily.   Marland Kitchen atenolol (TENORMIN) 50 MG tablet Take 1 tablet (50 mg total) by mouth daily.   Marland Kitchen atorvastatin (LIPITOR) 20 MG tablet Take 1 tablet (20 mg total) by mouth daily.   . Blood Glucose Monitoring Suppl (ACCU-CHEK AVIVA PLUS) w/Device KIT Use once daily to check blood sugar.   . cyclobenzaprine (FLEXERIL) 10 MG tablet Take 1 tablet (10 mg total) by mouth 2 (two) times daily as needed for muscle spasms. (Patient taking differently: 10 mg 2 (two) times daily. )   . diclofenac sodium (VOLTAREN) 1 % GEL Apply 2 g topically 4 (four) times daily.    Marland Kitchen escitalopram (LEXAPRO) 5 MG  tablet Take 1 tablet (5 mg total) by mouth daily. (Patient not taking: Reported on 09/03/2018)   . Fluticasone-Salmeterol (ADVAIR DISKUS) 250-50 MCG/DOSE AEPB Inhale 1 puff into the lungs 2 (two) times daily. Frequency:BID   Dosage:0.0     Instructions:  Note:Dose: 250-50MCG   . gabapentin (NEURONTIN) 400 MG capsule Take 400 mg by mouth 3 (three) times daily.    Marland Kitchen glimepiride (AMARYL) 1 MG tablet Take 2 tablets (2 mg total) by mouth daily with supper. (Patient taking differently: Take 2 mg by mouth 2 (two) times daily with a meal. )   . Glucose Blood (ACCU-CHEK AVIVA PLUS VI) by In Vitro route. Pt needs accu check Aviva plus lancets, test strips and meter to check blood sugars once daily   . glucose blood (ACCU-CHEK AVIVA PLUS) test strip 1 each by Other route as needed for other. Use as instructed   . HYDROcodone-acetaminophen (NORCO) 5-325 MG tablet Take 1 tablet by mouth 2 (two) times daily.    . hydroxypropyl methylcellulose / hypromellose (ISOPTO TEARS / GONIOVISC) 2.5 % ophthalmic solution Place 1 drop into both eyes daily as needed for dry eyes.   Marland Kitchen ipratropium (ATROVENT) 0.02 % nebulizer solution Use every 6 hours for Shortness of breath and wheezing and as needed.  Prescription is for 90 days   .  isosorbide mononitrate (IMDUR) 60 MG 24 hr tablet Take 1 tablet (60 mg total) by mouth daily.   Marland Kitchen loratadine (CLARITIN) 10 MG tablet Take 10 mg by mouth daily.    . metFORMIN (GLUCOPHAGE) 500 MG tablet Take 1 tablet (500 mg total) by mouth 2 (two) times daily with a meal.   . nitroGLYCERIN (NITROSTAT) 0.4 MG SL tablet Place 0.4 mg under the tongue. 09/03/2018: Pt may need a refill because home medication is expired.   . nystatin (MYCOSTATIN) 100000 UNIT/ML suspension Take 5 mLs (500,000 Units total) by mouth 4 (four) times daily.   . OXYGEN Inhale 2 L into the lungs continuous.    . traZODone (DESYREL) 100 MG tablet Take 1 tablet (100 mg total) by mouth at bedtime.   Marland Kitchen umeclidinium bromide (INCRUSE  ELLIPTA) 62.5 MCG/INH AEPB Inhale 1 puff into the lungs daily.   . [DISCONTINUED] doxycycline (VIBRA-TABS) 100 MG tablet Take 1 tablet (100 mg total) by mouth daily. For acne. 05/01/2018: Prn when experiencing acne flare  . azithromycin (ZITHROMAX) 250 MG tablet Take as directed. 09/03/2018: Pt has prescription but has not yet picked up yet.  . [DISCONTINUED] amoxicillin-clavulanate (AUGMENTIN) 875-125 MG tablet Take 1 tablet by mouth 2 (two) times daily. (Patient not taking: Reported on 09/03/2018)   . [DISCONTINUED] predniSONE (DELTASONE) 10 MG tablet Use per dose pack 09/03/2018: Pt has prescription but has not yet started taking.   No facility-administered encounter medications on file as of 09/03/2018.       Medical History: Past Medical History:  Diagnosis Date  . AAA (abdominal aortic aneurysm) ([place])   . AAA (abdominal aortic aneurysm) ([place])   . Anxiety   . Asthma   . CHF (congestive heart failure) ([place])   . COPD (chronic obstructive pulmonary disease) ([place])   . Coronary artery disease   . Diabetes mellitus without complication ([place])   . ESRD (end stage renal disease) ([place])   . Hyperlipidemia   . Hypertension   . Iliac artery aneurysm, bilateral ([place])   . Obstructive sleep apnea-hypopnea syndrome   . Sleep apnea      Vital Signs: BP 108/80 (BP Location: Left Arm, Patient Position: Sitting, Cuff Size: Normal)   Pulse (!) 141   Temp 98.7 F (37.1 C) (Oral)   Resp 16   Ht 5' 10"  (1.778 m)   Wt 261 lb (118.4 kg)   SpO2 90%   BMI 37.45 kg/m    Review of Systems  Constitutional: Negative.  Negative for chills, fatigue and unexpected weight change.  HENT: Negative.  Negative for congestion, rhinorrhea, sneezing and sore throat.   Eyes: Negative for redness.  Respiratory: Negative.  Negative for cough, chest tightness and shortness of breath.   Cardiovascular: Negative.  Negative for chest pain and palpitations.  Gastrointestinal: Negative.  Negative for abdominal  pain, constipation, diarrhea, nausea and vomiting.  Endocrine: Negative.   Genitourinary: Negative.  Negative for dysuria and frequency.  Musculoskeletal: Negative.  Negative for arthralgias, back pain, joint swelling and neck pain.  Skin: Negative.  Negative for rash.  Allergic/Immunologic: Negative.   Neurological: Negative.  Negative for tremors and numbness.  Hematological: Negative for adenopathy. Does not bruise/bleed easily.  Psychiatric/Behavioral: Negative.  Negative for behavioral problems, sleep disturbance and suicidal ideas. The patient is not nervous/anxious.     Physical Exam Vitals signs and nursing note reviewed.  Constitutional:      General: He is not in acute distress.    Appearance: He is well-developed. He is  not diaphoretic.  HENT:     Head: Normocephalic and atraumatic.     Mouth/Throat:     Pharynx: No oropharyngeal exudate.  Eyes:     Pupils: Pupils are equal, round, and reactive to light.  Neck:     Musculoskeletal: Normal range of motion and neck supple.     Thyroid: No thyromegaly.     Vascular: No JVD.     Trachea: No tracheal deviation.  Cardiovascular:     Rate and Rhythm: Normal rate and regular rhythm.     Heart sounds: Normal heart sounds. No murmur. No friction rub. No gallop.   Pulmonary:     Effort: Pulmonary effort is normal. No respiratory distress.     Breath sounds: Normal breath sounds. No wheezing or rales.  Chest:     Chest wall: No tenderness.  Abdominal:     Palpations: Abdomen is soft.     Tenderness: There is no abdominal tenderness. There is no guarding.  Musculoskeletal: Normal range of motion.  Lymphadenopathy:     Cervical: No cervical adenopathy.  Skin:    General: Skin is warm and dry.  Neurological:     Mental Status: He is alert and oriented to person, place, and time.     Cranial Nerves: No cranial nerve deficit.  Psychiatric:        Behavior: Behavior normal.        Thought Content: Thought content normal.         Judgment: Judgment normal.    Assessment/Plan: 1. Upper respiratory tract infection, unspecified type Patient provided with Z-Pak.  He will need to follow-up in clinic if symptoms fail to improve in 7 to 10 days.  I encouraged him to continue to take over-the-counter medications for symptom management and drink plenty of fluids. - azithromycin (ZITHROMAX) 250 MG tablet; Take as directed.  Dispense: 6 tablet; Refill: 0  2. Nicotine dependence with current use Smoking cessation counseling: 1. Pt acknowledges the risks of long term smoking, she will try to quite smoking. 2. Options for different medications including nicotine products, chewing gum, patch etc, Wellbutrin and Chantix is discussed 3. Goal and date of compete cessation is discussed 4. Total time spent in smoking cessation is 15 min.   3. Chronic a-fib Stable, continue current medication regimen follow-up with cardiology as prescribed.  General Counseling: Aysen verbalizes understanding of the findings of todays visit and agrees with plan of treatment. I have discussed any further diagnostic evaluation that may be needed or ordered today. We also reviewed his medications today. he has been encouraged to call the office with any questions or concerns that should arise related to todays visit.   No orders of the defined types were placed in this encounter.   Meds ordered this encounter  Medications  . azithromycin (ZITHROMAX) 250 MG tablet    Sig: Take as directed.    Dispense:  6 tablet    Refill:  0  . DISCONTD: predniSONE (DELTASONE) 10 MG tablet    Sig: Use per dose pack    Dispense:  21 tablet    Refill:  0    Time spent: 25 Minutes  This patient was seen by Orson Gear AGNP-C in Collaboration with Dr Lavera Guise as a part of collaborative care agreement.  Kendell Bane AGNP-C Internal Medicine

## 2018-09-03 NOTE — ED Triage Notes (Signed)
Pt stating he saw his pulmonologist today and was told to see his cardiologist for a HR of 140s. Pt stating in August he had "all kinds of medicine," for the same sx. Pt stating SOB.

## 2018-09-03 NOTE — ED Notes (Signed)
ED Provider at bedside. 

## 2018-09-03 NOTE — Progress Notes (Signed)
Svn given. Patient tolerated well. States he will have rn to rt if he wants tx during the night. He has been up since 1am

## 2018-09-03 NOTE — ED Provider Notes (Signed)
  [facility] Emergency Department Provider Note  ____________________________________________   First MD Initiated Contact with Patient 09/03/18 1653     (approximate)  I have reviewed the triage vital signs and the nursing notes.   HISTORY  Chief Complaint Tachycardia    HPI Lawrence Steele is a 67 y.o. male history of COPD and multiple other medical conditions including atrial flutter  Patient reports for about a week now he had a cough some slight congestion and some wheezing, feels like he has had his COPD causing trouble for him when he went to his pulmonologist today for evaluation.  He noticed his heart rate was quite high and referred him here for evaluation after discussion with cardiology  Reports he had about the same where Dr. Gollan had to evaluate and treat him for elevated heart rate in the past several months ago.  He has noticed his hearts been racing consistently for the last few days as well, no chest pain.  No shortness of breath except for some coughing and occasional wheezing.  No nausea or vomiting.  Feeling occasionally fevers but no chills.  No runny nose.   Past Medical History:  Diagnosis Date  . AAA (abdominal aortic aneurysm) (HCC)   . AAA (abdominal aortic aneurysm) (HCC)   . Anxiety   . Asthma   . CHF (congestive heart failure) (HCC)   . COPD (chronic obstructive pulmonary disease) (HCC)   . Coronary artery disease   . Diabetes mellitus without complication (HCC)   . ESRD (end stage renal disease) (HCC)   . Hyperlipidemia   . Hypertension   . Iliac artery aneurysm, bilateral (HCC)   . Obstructive sleep apnea-hypopnea syndrome   . Sleep apnea     Patient Active Problem List   Diagnosis Date Noted  . Chronic a-fib 05/02/2018  . Atrial flutter (HCC) 04/09/2018  . Uncontrolled type 2 diabetes mellitus with hyperglycemia (HCC) 01/09/2018  . Nicotine dependence with current use 01/09/2018  . Diabetes mellitus without  complication (HCC) 09/19/2017  . Myalgia 09/19/2017  . Vitamin B12 deficiency anemia 09/19/2017  . Common migraine with intractable migraine 09/19/2017  . Chronic hypoxemic respiratory failure (HCC) 09/19/2017  . Peripheral vascular disease, unspecified (HCC) 09/19/2017  . Dysuria 09/19/2017  . Osteoarthritis 09/19/2017  . Diverticulitis of intestine 09/19/2017  . Cervicalgia 09/19/2017  . Unspecified abdominal hernia without obstruction or gangrene 09/19/2017  . Vasomotor rhinitis 09/19/2017  . Occlusion and stenosis of bilateral carotid arteries 09/19/2017  . Generalized anxiety disorder 09/19/2017  . Malaise 09/19/2017  . Chronic bronchitis (HCC) 09/19/2017  . Obstructive sleep apnea (adult) (pediatric) 09/19/2017  . Cardiac arrhythmia 09/19/2017  . Smoker 04/02/2016  . Pain in the chest 08/20/2014  . SOB (shortness of breath) 08/20/2014  . Coronary artery disease involving native coronary artery of native heart with angina pectoris with documented spasm (HCC) 08/20/2014  . Emphysema of lung (HCC) 08/20/2014  . Coronary artery disease involving native coronary artery of native heart with unstable angina pectoris (HCC) 08/20/2014  . AAA (abdominal aortic aneurysm) without rupture (HCC) 08/20/2014  . Iliac artery aneurysm, bilateral (HCC) 08/20/2014  . Hyperlipidemia 08/20/2014  . Essential hypertension 08/20/2014  . Morbid obesity (HCC) 08/20/2014    Past Surgical History:  Procedure Laterality Date  . ABDOMINAL AORTIC ANEURYSM REPAIR  03/06/2015   [facility]   . ADENOIDECTOMY    . APPENDECTOMY    . CARDIAC CATHETERIZATION  2010,03/16/2003,11/04/2002  . CARDIOVERSION N/A 04/24/2018   Procedure: CARDIOVERSION;    Surgeon: Nelva Bush, MD;  Location: [facility] ORS;  Service: Cardiovascular;  Laterality: N/A;  . CORONARY ANGIOPLASTY  03/16/2003   stent to the RCA  & 2/18/2004stent mid circumflex  . ELBOW SURGERY    . HERNIA REPAIR    . NASAL SINUS SURGERY    . PENILE PROSTHESIS  IMPLANT  1995  . PILONIDAL CYST EXCISION    . TEE WITHOUT CARDIOVERSION N/A 04/24/2018   Procedure: TRANSESOPHAGEAL ECHOCARDIOGRAM (TEE);  Surgeon: Nelva Bush, MD;  Location: [facility] ORS;  Service: Cardiovascular;  Laterality: N/A;  . TONSILECTOMY, ADENOIDECTOMY, BILATERAL MYRINGOTOMY AND TUBES    . UMBILICAL HERNIA REPAIR      Prior to Admission medications   Medication Sig Start Date End Date Taking? Authorizing Provider  acetaminophen (TYLENOL) 500 MG tablet Take 500 mg by mouth every 6 (six) hours as needed.    Yes [provider]  albuterol (PROAIR HFA) 108 (90 Base) MCG/ACT inhaler Inhale 2 puffs into the lungs every 6 (six) hours as needed. 02/05/18  Yes Lavera Guise, MD  allopurinol (ZYLOPRIM) 100 MG tablet Take 1 tablet (100 mg total) by mouth 2 (two) times daily. 12/02/17  Yes Boscia, Greer Ee, NP  apixaban (ELIQUIS) 5 MG TABS tablet Take 1 tablet (5 mg total) by mouth 2 (two) times daily. 04/09/18  Yes Minna Merritts, MD  atenolol (TENORMIN) 50 MG tablet Take 1 tablet (50 mg total) by mouth daily. 05/23/18  Yes Minna Merritts, MD  atorvastatin (LIPITOR) 20 MG tablet Take 1 tablet (20 mg total) by mouth daily. 10/15/17  Yes Minna Merritts, MD  azithromycin (ZITHROMAX) 250 MG tablet Take as directed. 09/03/18  Yes Scarboro, Audie Clear, NP  Blood Glucose Monitoring Suppl (ACCU-CHEK AVIVA PLUS) w/Device KIT Use once daily to check blood sugar. 01/24/18  Yes Boscia, Greer Ee, NP  cyclobenzaprine (FLEXERIL) 10 MG tablet Take 1 tablet (10 mg total) by mouth 2 (two) times daily as needed for muscle spasms. Patient taking differently: 10 mg 2 (two) times daily.  04/11/18  Yes Boscia, Greer Ee, NP  diclofenac sodium (VOLTAREN) 1 % GEL Apply 2 g topically 4 (four) times daily.    Yes [provider]  doxycycline (VIBRA-TABS) 100 MG tablet Take 1 tablet (100 mg total) by mouth daily. For acne. 09/03/18  Yes Boscia, Heather E, NP  Fluticasone-Salmeterol (ADVAIR DISKUS) 250-50  MCG/DOSE AEPB Inhale 1 puff into the lungs 2 (two) times daily. Frequency:BID   Dosage:0.0     Instructions:  Note:Dose: 250-50MCG 02/05/18  Yes Lavera Guise, MD  gabapentin (NEURONTIN) 400 MG capsule Take 400 mg by mouth 3 (three) times daily.    Yes [provider]  glimepiride (AMARYL) 1 MG tablet Take 2 tablets (2 mg total) by mouth daily with supper. Patient taking differently: Take 2 mg by mouth 2 (two) times daily with a meal.  02/03/18  Yes Boscia, Heather E, NP  Glucose Blood (ACCU-CHEK AVIVA PLUS VI) by In Vitro route. Pt needs accu check Aviva plus lancets, test strips and meter to check blood sugars once daily   Yes [provider]  glucose blood (ACCU-CHEK AVIVA PLUS) test strip 1 each by Other route as needed for other. Use as instructed 01/24/18  Yes Boscia, Heather E, NP  hydroxypropyl methylcellulose / hypromellose (ISOPTO TEARS / GONIOVISC) 2.5 % ophthalmic solution Place 1 drop into both eyes daily as needed for dry eyes.   Yes [provider]  ipratropium (ATROVENT) 0.02 % nebulizer solution Use  every 6 hours for Shortness of breath and wheezing and as needed.  Prescription is for 90 days 10/02/17  Yes Boscia, Heather E, NP  isosorbide mononitrate (IMDUR) 60 MG 24 hr tablet Take 1 tablet (60 mg total) by mouth daily. 06/09/18  Yes Boscia, Heather E, NP  loratadine (CLARITIN) 10 MG tablet Take 10 mg by mouth daily.    Yes [provider]  meloxicam (MOBIC) 7.5 MG tablet Take 7.5 mg by mouth 2 (two) times daily. 08/01/18  Yes [provider]  metFORMIN (GLUCOPHAGE) 500 MG tablet Take 1 tablet (500 mg total) by mouth 2 (two) times daily with a meal. 06/09/18  Yes Boscia, Heather E, NP  nitroGLYCERIN (NITROSTAT) 0.4 MG SL tablet Place 0.4 mg under the tongue.   Yes [provider]  nystatin (MYCOSTATIN) 100000 UNIT/ML suspension Take 5 mLs (500,000 Units total) by mouth 4 (four) times daily. 08/12/18  Yes Boscia, Heather E, NP    oxyCODONE-acetaminophen (PERCOCET) 7.5-325 MG tablet Take 1 tablet by mouth 3 (three) times daily.   Yes [provider]  OXYGEN Inhale 2 L into the lungs continuous.    Yes [provider]  traZODone (DESYREL) 100 MG tablet Take 1 tablet (100 mg total) by mouth at bedtime. 07/01/18  Yes Boscia, Heather E, NP  umeclidinium bromide (INCRUSE ELLIPTA) 62.5 MCG/INH AEPB Inhale 1 puff into the lungs daily. 02/05/18  Yes Khan, Fozia M, MD  ACCU-CHEK SOFTCLIX LANCETS lancets Use as instructed  Once a daily E11.65 01/30/18   Boscia, Heather E, NP  escitalopram (LEXAPRO) 5 MG tablet Take 1 tablet (5 mg total) by mouth daily. Patient not taking: Reported on 09/03/2018 05/09/18   Boscia, Heather E, NP  HYDROcodone-acetaminophen (NORCO) 5-325 MG tablet Take 1 tablet by mouth 2 (two) times daily.     [provider]  predniSONE (DELTASONE) 10 MG tablet Use per dose pack 09/03/18   Scarboro, Adam J, NP    Allergies Codeine  Family History  Problem Relation Age of Onset  . Hypertension Mother   . Hyperlipidemia Mother   . Depression Mother   . Heart attack Father 55  . Heart disease Father   . Hypertension Father   . Hyperlipidemia Father     Social History Social History   Tobacco Use  . Smoking status: Current Every Day Smoker    Packs/day: 0.25    Years: 45.00    Pack years: 11.25    Types: Cigarettes  . Smokeless tobacco: Former User    Types: Chew  . Tobacco comment: patient refused couseling information  Substance Use Topics  . Alcohol use: Not Currently  . Drug use: No    Review of Systems Constitutional: No fever seasonal chills eyes: No visual changes. ENT: No sore throat. Cardiovascular: Denies chest pain. Respiratory: See HPI gastrointestinal: No abdominal pain.   Genitourinary: Negative for dysuria. Musculoskeletal: Negative for back pain. Skin: Negative for rash. Neurological: Negative for headaches, areas of focal weakness or  numbness.    ____________________________________________   PHYSICAL EXAM:  VITAL SIGNS: ED Triage Vitals [09/03/18 1644]  Enc Vitals Group     BP 112/78     Pulse Rate (!) 142     Resp 20     Temp 98.5 F (36.9 C)     Temp Source Oral     SpO2 95 %     Weight 260 lb (117.9 kg)     Height 5' 10" (1.778 m)     Head   Circumference      Peak Flow      Pain Score 0     Pain Loc      Pain Edu?      Excl. in GC?     Constitutional: Alert and oriented. Well appearing and in no acute distress. Eyes: Conjunctivae are normal. Head: Atraumatic. Nose: No congestion/rhinnorhea. Mouth/Throat: Mucous membranes are moist. Neck: No stridor.  Cardiovascular: Irregularly tachycardic rate, regular rhythm. Grossly normal heart sounds.  Good peripheral circulation. Respiratory: Normal respiratory effort.  No retractions.  Mild end expiratory wheezing.  Dry cough with breathing Gastrointestinal: Soft and nontender. No distention. Musculoskeletal: No lower extremity tenderness nor edema. Neurologic:  Normal speech and language. No gross focal neurologic deficits are appreciated.  Skin:  Skin is warm, dry and intact. No rash noted. Psychiatric: Mood and affect are normal. Speech and behavior are normal.  ____________________________________________   LABS (all labs ordered are listed, but only abnormal results are displayed)  Labs Reviewed  CBC - Abnormal; Notable for the following components:      Result Value   RBC 3.95 (*)    MCV 104.6 (*)    MCH 34.9 (*)    RDW 15.8 (*)    Platelets 122 (*)    All other components within normal limits  BASIC METABOLIC PANEL - Abnormal; Notable for the following components:   Glucose, Bld 376 (*)    Calcium 8.7 (*)    All other components within normal limits  TROPONIN I   ____________________________________________  EKG  Reviewed entered by me at 1650 Heart rate 140 QRS 99 QTc 500 Atrial flutter, minimal nonspecific T wave  abnormality.  No STE  Atrial flutter with RVR ____________________________________________  RADIOLOGY    Chest x-ray reviewed by me negative for acute ____________________________________________   PROCEDURES  Procedure(s) performed: None  Procedures  Critical Care performed: No  ____________________________________________   INITIAL IMPRESSION / ASSESSMENT AND PLAN / ED COURSE  Pertinent labs & imaging results that were available during my care of the patient were reviewed by me and considered in my medical decision making (see chart for details).   Patient presents for concerns of tachycardia.  Atrial flutter with RVR.  Given small dose of diltiazem which did not improve rate but dropped his blood pressure just slightly.  Discussed case with Dr. Taylor of cardiology, he advises keeping patient n.p.o. at midnight which I discussed with the hospitalist, no further rate control as the patient is tolerating it well at this time and they may want to consider cardioversion tomorrow.  Think this is reasonable, we did discuss also other options such as amiodarone or digoxin, but cardiology advised against at this time.  Also appears consistent with COPD exacerbation, treated with steroids and Xopenex at this time.  Patient does report improvement in his wheezing with Xopenex treatments.  ----------------------------------------- 7:25 PM on 09/03/2018 -----------------------------------------  No pleuritic pain.  No signs or symptoms suggest PE or DVT.  Chest x-ray reassuring without infiltrate.  No chest pain, troponin is normal and doubt ACS is present.        ____________________________________________   FINAL CLINICAL IMPRESSION(S) / ED DIAGNOSES  Final diagnoses:  Atrial flutter with rapid ventricular response (HCC)  COPD with acute exacerbation (HCC)        Note:  This document was prepared using Dragon voice recognition software and may include unintentional  dictation errors       Quale, Mark, MD 09/03/18 1925  

## 2018-09-04 ENCOUNTER — Encounter: Payer: Self-pay | Admitting: Anesthesiology

## 2018-09-04 DIAGNOSIS — I483 Typical atrial flutter: Secondary | ICD-10-CM

## 2018-09-04 DIAGNOSIS — R0603 Acute respiratory distress: Secondary | ICD-10-CM

## 2018-09-04 DIAGNOSIS — I25118 Atherosclerotic heart disease of native coronary artery with other forms of angina pectoris: Secondary | ICD-10-CM

## 2018-09-04 DIAGNOSIS — J441 Chronic obstructive pulmonary disease with (acute) exacerbation: Principal | ICD-10-CM

## 2018-09-04 LAB — CBC
HCT: 42.9 % (ref 39.0–52.0)
Hemoglobin: 13.9 g/dL (ref 13.0–17.0)
MCH: 34.3 pg — ABNORMAL HIGH (ref 26.0–34.0)
MCHC: 32.4 g/dL (ref 30.0–36.0)
MCV: 105.9 fL — ABNORMAL HIGH (ref 80.0–100.0)
PLATELETS: 107 10*3/uL — AB (ref 150–400)
RBC: 4.05 MIL/uL — ABNORMAL LOW (ref 4.22–5.81)
RDW: 15.3 % (ref 11.5–15.5)
WBC: 8.8 10*3/uL (ref 4.0–10.5)
nRBC: 0 % (ref 0.0–0.2)

## 2018-09-04 LAB — BASIC METABOLIC PANEL
Anion gap: 8 (ref 5–15)
BUN: 20 mg/dL (ref 8–23)
CO2: 24 mmol/L (ref 22–32)
CREATININE: 0.82 mg/dL (ref 0.61–1.24)
Calcium: 8.6 mg/dL — ABNORMAL LOW (ref 8.9–10.3)
Chloride: 103 mmol/L (ref 98–111)
GFR calc Af Amer: >60 mL/min (ref >60)
GFR calc non Af Amer: >60 mL/min (ref >60)
Glucose, Bld: 342 mg/dL — ABNORMAL HIGH (ref 70–99)
Potassium: 5.1 mmol/L (ref 3.5–5.1)
Sodium: 135 mmol/L (ref 135–145)

## 2018-09-04 LAB — GLUCOSE, CAPILLARY
GLUCOSE-CAPILLARY: 264 mg/dL — AB (ref 70–99)
Glucose-Capillary: 297 mg/dL — ABNORMAL HIGH (ref 70–99)
Glucose-Capillary: 264 mg/dL — ABNORMAL HIGH (ref 70–99)
Glucose-Capillary: 323 mg/dL — ABNORMAL HIGH (ref 70–99)

## 2018-09-04 MED ORDER — ALBUTEROL SULFATE (2.5 MG/3ML) 0.083% IN NEBU
2.5000 mg | INHALATION_SOLUTION | Freq: Once | RESPIRATORY_TRACT | Status: AC
  Administered 2018-09-04: 2.5 mg via RESPIRATORY_TRACT
  Filled 2018-09-04: qty 3

## 2018-09-04 MED ORDER — SODIUM CHLORIDE 0.9% FLUSH
3.0000 mL | Freq: Two times a day (BID) | INTRAVENOUS | Status: DC
  Administered 2018-09-04: 3 mL via INTRAVENOUS

## 2018-09-04 MED ORDER — SODIUM CHLORIDE 0.9% FLUSH: 3.0000 mL | INTRAVENOUS | Status: DC | PRN

## 2018-09-04 MED ORDER — INSULIN ASPART 100 UNIT/ML ~~LOC~~ SOLN
0.0000 [IU] | SUBCUTANEOUS | Status: DC
  Administered 2018-09-04: 11 [IU] via SUBCUTANEOUS
  Administered 2018-09-04 – 2018-09-05 (×2): 8 [IU] via SUBCUTANEOUS
  Administered 2018-09-05: 5 [IU] via SUBCUTANEOUS
  Administered 2018-09-05 (×2): 8 [IU] via SUBCUTANEOUS
  Filled 2018-09-04 (×6): qty 1

## 2018-09-04 MED ORDER — BENZONATATE 100 MG PO CAPS
200.0000 mg | ORAL_CAPSULE | Freq: Three times a day (TID) | ORAL | Status: DC
  Administered 2018-09-04 – 2018-09-05 (×3): 200 mg via ORAL
  Filled 2018-09-04 (×3): qty 2

## 2018-09-04 MED ORDER — INSULIN DETEMIR 100 UNIT/ML ~~LOC~~ SOLN
10.0000 [IU] | Freq: Two times a day (BID) | SUBCUTANEOUS | Status: DC
  Administered 2018-09-04 – 2018-09-05 (×2): 10 [IU] via SUBCUTANEOUS
  Filled 2018-09-04 (×4): qty 0.1

## 2018-09-04 MED ORDER — MOMETASONE FURO-FORMOTEROL FUM 200-5 MCG/ACT IN AERO
2.0000 | INHALATION_SPRAY | Freq: Two times a day (BID) | RESPIRATORY_TRACT | Status: DC
  Administered 2018-09-04 – 2018-09-05 (×2): 2 via RESPIRATORY_TRACT
  Filled 2018-09-04: qty 8.8

## 2018-09-04 MED ORDER — SODIUM CHLORIDE 0.9 % IV SOLN
250.0000 mL | INTRAVENOUS | Status: DC
  Administered 2018-09-04: 250 mL via INTRAVENOUS

## 2018-09-04 NOTE — Progress Notes (Signed)
[place] [facility] [place] NAME: Lawrence Steele    MR#:  [id number]  DATE OF BIRTH:  1951-05-08  SUBJECTIVE:  CHIEF COMPLAINT:  -pt with sob and palpitations   REVIEW OF SYSTEMS:  CONSTITUTIONAL: No fever, fatigue or weakness.  EYES: No blurred or double vision.  EARS, NOSE, AND THROAT: No tinnitus or ear pain.  RESPIRATORY: No cough, reporting palpitations, shortness of breath, denies wheezing or hemoptysis.  CARDIOVASCULAR: No chest pain, orthopnea, edema.  GASTROINTESTINAL: No nausea, vomiting, diarrhea or abdominal pain.  GENITOURINARY: No dysuria, hematuria.  ENDOCRINE: No polyuria, nocturia,  HEMATOLOGY: No anemia, easy bruising or bleeding SKIN: No rash or lesion. MUSCULOSKELETAL: No joint pain or arthritis.   NEUROLOGIC: No tingling, numbness, weakness.  PSYCHIATRY: No anxiety or depression.   DRUG ALLERGIES:   Allergies  Allergen Reactions  . Codeine Itching    Pt reports tolerating oral opioids.    VITALS:  Blood pressure (!) 132/95, pulse (!) 130, temperature (!) 97.4 F (36.3 C), temperature source Oral, resp. rate 18, height 5\' 10"  (1.778 m), weight 119.4 kg, SpO2 97 %.  PHYSICAL EXAMINATION:  GENERAL:  67 y.o.-year-old patient lying in the bed with no acute distress.  EYES: Pupils equal, round, reactive to light and accommodation. No scleral icterus. Extraocular muscles intact.  HEENT: Head atraumatic, normocephalic. Oropharynx and nasopharynx clear.  NECK:  Supple, no jugular venous distention. No thyroid enlargement, no tenderness.  LUNGS: Normal breath sounds bilaterally, no wheezing, rales,rhonchi or crepitation. No use of accessory muscles of respiration.  CARDIOVASCULAR: Irregularly irregular no murmurs, rubs, or gallops.  ABDOMEN: Soft, nontender, nondistended. Bowel sounds present. No organomegaly or mass.  EXTREMITIES: No pedal edema, cyanosis, or clubbing.  NEUROLOGIC: Awake, alert and oriented x3  sensation intact. Gait not checked.  PSYCHIATRIC: The patient is alert and oriented x 3.  SKIN: No obvious rash, lesion, or ulcer.    LABORATORY PANEL:   CBC Recent Labs  Lab 09/04/18 0312  WBC 8.8  HGB 13.9  HCT 42.9  PLT 107*   ------------------------------------------------------------------------------------------------------------------  Chemistries  Recent Labs  Lab 09/04/18 0312  NA 135  K 5.1  CL 103  CO2 24  GLUCOSE 342*  BUN 20  CREATININE 0.82  CALCIUM 8.6*   ------------------------------------------------------------------------------------------------------------------  Cardiac Enzymes Recent Labs  Lab 09/03/18 1707  TROPONINI <0.03   ------------------------------------------------------------------------------------------------------------------  RADIOLOGY:  Dg Chest 2 View  Result Date: 09/03/2018 CLINICAL DATA:  Dry cough and dyspnea x5 days EXAM: CHEST - 2 VIEW COMPARISON:  CXR 04/11/2018 FINDINGS: Stable cardiomegaly with mild uncoiling of the thoracic aorta. No thoracic aortic aneurysm. There is atelectasis at the left lung base. Remote left-sided rib fractures are noted. No acute osseous abnormality is seen. Mild degenerative change is noted along the dorsal spine. Pulmonary vasculature is unremarkable. IMPRESSION: No active cardiopulmonary disease.  Stable mild cardiomegaly. Electronically Signed   By: Ashley Royalty M.D.   On: 09/03/2018 18:09    EKG:   Orders placed or performed during the hospital encounter of 09/03/18  . EKG 12-Lead  . EKG 12-Lead  . EKG 12-Lead  . EKG    ASSESSMENT AND PLAN:    1.  Acute on chronic COPD exacerbation with bronchitis:  Clinically improving  Continue and taper IV Solu-Medrol  Doxycycline  Bronchodilator treatments  Wean off oxygen as tolerated   2.  A flutter: Rapid ventricular rate.  Cardiology team notified.   Plan to continue anticoagulation and possible cardioversion in the  morning.  Keep  patient n.p.o. after midnight.  Discussed with Dr. Rockey Situ.  3.  Diabetes with hyperglycemia: Patient will be n.p.o. after midnight.  Sliding scale insulin.  Monitor CBGs closely.  Adjust insulin as indicated.  4.  Chronic other medical problems: Monitor.  Continue home medication as ordered  5.  Tobacco abuse disorder counseled patient to quit smoking for 5 minutes.  He verbalized understanding but refusing nicotine patch  No history of end-stage renal disease as per patient's report  DVT prophylaxis: On full dose of anticoagulation with apixaban  Estimated length of stay more than 2 midnight     All the records are reviewed and case discussed with Care Management/Social Workerr. Management plans discussed with the patient, family and they are in agreement.  CODE STATUS: fc  TOTAL TIME TAKING CARE OF THIS PATIENT: 71minutes.   POSSIBLE D/C IN 1-2 DAYS, DEPENDING ON CLINICAL CONDITION.  Note: This dictation was prepared with Dragon dictation along with smaller phrase technology. Any transcriptional errors that result from this process are unintentional.   Nicholes Mango M.D on 09/04/2018 at 3:55 PM  Between 7am to 6pm - Pager - [phone] After 6pm go to [email or web address] - password [email or web address] [facility]  Office  [phone]  CC: Primary care physician; Ronnell Freshwater, NP

## 2018-09-04 NOTE — Consult Note (Signed)
Cardiology Consultation:   Patient ID: Lawrence Steele MRN: [id number]; DOB: 1950/12/24  Admit date: 09/03/2018 Date of Consult: 09/04/2018  Primary Care Provider: Ronnell Freshwater, NP Primary [place], Dr. Rockey Situ Primary Electrophysiologist:  None    Patient Profile:   [name] is a 67 y.o. male with a hx of atrial flutter, coronary artery disease, diastolic CHF, hypertension, hyperlipidemia, COPD, current smoker, alcohol use, DM 2, AAA (followed at [facility], s/p [facility]), bilateral iliac artery aneurysm, OSA on CPAP and nighttime oxygen, and morbid obesity who is being seen today for the evaluation of atrial flutter at the request of Dr. Posey Pronto.  History of Present Illness:   Lawrence Steele is a 67 year old male with PMH as above.  He has a history of previous falls, low back pain, and neuropathy.  He had an MRI of his lumbar spine showing mild degeneration of disks.  He has also has reported chest pain over the past several years that he has described as radiating into the left arm with associated numbness.  He was reportedly on Lipitor for years but then decided to stop statin therapy on his own. It was suspected this was possibly due to stomach issues as he had previously reported stomach issues and myalgias on Crestor, as well as myalgias on simvastatin.  He is reportedly followed by pulmonology with a long history of shortness of breath and on several inhalers.    -2010: Cardiac catheterization showed diffuse and mild disease with 50% mid left circumflex and 20% disease present in other vessels.  No intervention was performed at that time.   -2015: Stress test was performed that showed normal ejection fraction, no ischemia, and no suggestion of blockage. There was normal wall motion with normal EKG and ruled low risk scan.  He underwent cardiac catheterization shortly thereafter that showed insignificant left main, LAD, and RCA disease but significant left circumflex disease.   Ventricular ejection fraction was estimated at 51%.  FFR at rest was 0.99, after 2 minutes of adenosine IV infusion it was 0.89. -09/2015: CT performed at [facility] s/p completed AAA endovascular stent at [facility] showed stable aortailiac endograft with decreased aneurysm size, stable bilateral common iliac artery aneurysms, and stable anterior abdominal wall hernia.   -10/2016 stress test showed no significant ischemia with an inferior apical fixed perfusion defect of small size.  Ejection fraction 62%. Additional cardiac catheterization was offered if symptoms progressed.   -04/2018: He underwent transesophageal echocardiogram followed by cardioversion on 04/24/2018 for atrial flutter and reportedly remained in sinus with better energy, improved rates, on 2 L oxygen with no desaturations.  He did report rare shooting pain in his left chest.   Yesterday 09/03/2018, he called into the office with complaint of his heart rate escalating into the 140s.  He reported it had escalated to the 160s on 12/14 and had remained high all week.  He had recently been sick with a cold and congestion, with associated wheezing and chills/ feeling feverish.  He reported cough and congestion with wheezing but no shortness of breath.  He had therefore had a visit with his pulmonologist, as he thought it was a COPD exacerbation, and was prescribed abx and steroids.  Due to his elevated heart rate, he was also told by pulmonology to get in touch with cardiology.  His vitals in the clinic were documented as BP 108/80, HR 141.  He remained on 2 L home oxygen.  He reportedly took 1 TheraFlu dose on Monday 12/16, but he  had not taken any additional doses or any other cold medication since then.  He reported a similar episode back in August.  He was advised to report to the ED.  In the ED Vitals: BP 112/78, HR 142, RR 20, T 98.72F, SPO2 95% Labs: RBC 3.95, platelets 122, glucose 376, calcium 8.7, troponin negative EKG: Atrial flutter, rate 140 CXR:  No acute cardiopulmonary changes Meds: He was given a small dose of diltiazem, which did not alleviate his heart rate but instead dropped his blood pressure.  The case was therefore discussed with Dr. Lovena Le of cardiology, who advised to keep the patient n.p.o. at midnight.  This was discussed with the hospitalist and no further rate control was administered as the patient was tolerating it well at the time.  Amiodarone and digoxin were discussed with cardiology but advised against at that time.  It also appeared that the patient was suffering from COPD exacerbation, and he was treated with steroids and Xopenex.  The patient did not report improvement in his wheezing with Xopenex.  Past Medical History:  Diagnosis Date  . AAA (abdominal aortic aneurysm) ([place])   . AAA (abdominal aortic aneurysm) ([place])   . Anxiety   . Asthma   . CHF (congestive heart failure) ([place])   . COPD (chronic obstructive pulmonary disease) ([place])   . Coronary artery disease   . Diabetes mellitus without complication ([place])   . ESRD (end stage renal disease) ([place])   . Hyperlipidemia   . Hypertension   . Iliac artery aneurysm, bilateral ([place])   . Obstructive sleep apnea-hypopnea syndrome   . Sleep apnea     Past Surgical History:  Procedure Laterality Date  . ABDOMINAL AORTIC ANEURYSM REPAIR  03/06/2015   [place]    . APPENDECTOMY    . CARDIAC CATHETERIZATION  2010,03/16/2003,11/04/2002  . CARDIOVERSION N/A 04/24/2018   Procedure: CARDIOVERSION;  Surgeon: Nelva Bush, MD;  Location: [facility] ORS;  Service: Cardiovascular;  Laterality: N/A;  . CORONARY ANGIOPLASTY  03/16/2003   stent to the RCA  & 2/18/2004stent mid circumflex  . ELBOW SURGERY    . HERNIA REPAIR    . NASAL SINUS SURGERY    . PENILE PROSTHESIS IMPLANT  1995  . PILONIDAL CYST EXCISION    . TEE WITHOUT CARDIOVERSION N/A 04/24/2018   Procedure: TRANSESOPHAGEAL ECHOCARDIOGRAM (TEE);  Surgeon: Nelva Bush, MD;  Location: [facility] ORS;   Service: Cardiovascular;  Laterality: N/A;  . TONSILECTOMY, ADENOIDECTOMY, BILATERAL MYRINGOTOMY AND TUBES    . UMBILICAL HERNIA REPAIR       Home Medications:  Prior to Admission medications   Medication Sig Start Date End Date Taking? Authorizing Provider  acetaminophen (TYLENOL) 500 MG tablet Take 500 mg by mouth every 6 (six) hours as needed.    Yes [provider]  albuterol (PROAIR HFA) 108 (90 Base) MCG/ACT inhaler Inhale 2 puffs into the lungs every 6 (six) hours as needed. 02/05/18  Yes Lavera Guise, MD  allopurinol (ZYLOPRIM) 100 MG tablet Take 1 tablet (100 mg total) by mouth 2 (two) times daily. 12/02/17  Yes Boscia, Greer Ee, NP  apixaban (ELIQUIS) 5 MG TABS tablet Take 1 tablet (5 mg total) by mouth 2 (two) times daily. 04/09/18  Yes Minna Merritts, MD  atenolol (TENORMIN) 50 MG tablet Take 1 tablet (50 mg total) by mouth daily. 05/23/18  Yes Minna Merritts, MD  atorvastatin (LIPITOR) 20 MG tablet Take 1 tablet (20 mg total) by mouth daily.  10/15/17  Yes Minna Merritts, MD  azithromycin (ZITHROMAX) 250 MG tablet Take as directed. 09/03/18  Yes [place], Audie Clear, NP  Blood Glucose Monitoring Suppl (ACCU-CHEK AVIVA PLUS) w/Device KIT Use once daily to check blood sugar. 01/24/18  Yes Boscia, Greer Ee, NP  cyclobenzaprine (FLEXERIL) 10 MG tablet Take 1 tablet (10 mg total) by mouth 2 (two) times daily as needed for muscle spasms. Patient taking differently: 10 mg 2 (two) times daily.  04/11/18  Yes Boscia, Greer Ee, NP  diclofenac sodium (VOLTAREN) 1 % GEL Apply 2 g topically 4 (four) times daily.    Yes [provider]  doxycycline (VIBRA-TABS) 100 MG tablet Take 1 tablet (100 mg total) by mouth daily. For acne. 09/03/18  Yes Boscia, Heather E, NP  Fluticasone-Salmeterol (ADVAIR DISKUS) 250-50 MCG/DOSE AEPB Inhale 1 puff into the lungs 2 (two) times daily. Frequency:BID   Dosage:0.0     Instructions:  Note:Dose: 250-50MCG 02/05/18  Yes Lavera Guise, MD    gabapentin (NEURONTIN) 400 MG capsule Take 400 mg by mouth 3 (three) times daily.    Yes [provider]  glimepiride (AMARYL) 1 MG tablet Take 2 tablets (2 mg total) by mouth daily with supper. Patient taking differently: Take 2 mg by mouth 2 (two) times daily with a meal.  02/03/18  Yes Boscia, Heather E, NP  Glucose Blood (ACCU-CHEK AVIVA PLUS VI) by In Vitro route. Pt needs accu check Aviva plus lancets, test strips and meter to check blood sugars once daily   Yes [provider]  glucose blood (ACCU-CHEK AVIVA PLUS) test strip 1 each by Other route as needed for other. Use as instructed 01/24/18  Yes Boscia, Heather E, NP  hydroxypropyl methylcellulose / hypromellose (ISOPTO TEARS / GONIOVISC) 2.5 % ophthalmic solution Place 1 drop into both eyes daily as needed for dry eyes.   Yes [provider]  ipratropium (ATROVENT) 0.02 % nebulizer solution Use every 6 hours for Shortness of breath and wheezing and as needed.  Prescription is for 90 days 10/02/17  Yes Lawrence Freshwater, NP  isosorbide mononitrate (IMDUR) 60 MG 24 hr tablet Take 1 tablet (60 mg total) by mouth daily. 06/09/18  Yes Boscia, Greer Ee, NP  loratadine (CLARITIN) 10 MG tablet Take 10 mg by mouth daily.    Yes [provider]  meloxicam (MOBIC) 7.5 MG tablet Take 7.5 mg by mouth 2 (two) times daily. 08/01/18  Yes [provider]  metFORMIN (GLUCOPHAGE) 500 MG tablet Take 1 tablet (500 mg total) by mouth 2 (two) times daily with a meal. 06/09/18  Yes Boscia, Heather E, NP  nitroGLYCERIN (NITROSTAT) 0.4 MG SL tablet Place 0.4 mg under the tongue.   Yes [provider]  nystatin (MYCOSTATIN) 100000 UNIT/ML suspension Take 5 mLs (500,000 Units total) by mouth 4 (four) times daily. 08/12/18  Yes Boscia, Greer Ee, NP  oxyCODONE-acetaminophen (PERCOCET) 7.5-325 MG tablet Take 1 tablet by mouth 3 (three) times daily.   Yes [provider]  OXYGEN Inhale 2 L into the lungs  continuous.    Yes [provider]  traZODone (DESYREL) 100 MG tablet Take 1 tablet (100 mg total) by mouth at bedtime. 07/01/18  Yes Boscia, Heather E, NP  umeclidinium bromide (INCRUSE ELLIPTA) 62.5 MCG/INH AEPB Inhale 1 puff into the lungs daily. 02/05/18  Yes Lavera Guise, MD  ACCU-CHEK [facility] LANCETS lancets Use as instructed  Once a daily E11.65 01/30/18   Lawrence Freshwater, NP  escitalopram (  LEXAPRO) 5 MG tablet Take 1 tablet (5 mg total) by mouth daily. Patient not taking: Reported on 09/03/2018 05/09/18   Lawrence Freshwater, NP  HYDROcodone-acetaminophen (NORCO) 5-325 MG tablet Take 1 tablet by mouth 2 (two) times daily.     [provider]  predniSONE (DELTASONE) 10 MG tablet Use per dose pack 09/03/18   Kendell Bane, NP    Inpatient Medications: Scheduled Meds: . allopurinol  100 mg Oral BID  . apixaban  5 mg Oral BID  . atenolol  50 mg Oral Daily  . atorvastatin  20 mg Oral Daily  . budesonide (PULMICORT) nebulizer solution  0.25 mg Nebulization BID  . docusate sodium  100 mg Oral BID  . doxycycline  100 mg Oral Q12H  . escitalopram  5 mg Oral Daily  . gabapentin  400 mg Oral TID  . insulin aspart  0-15 Units Subcutaneous TID WC  . ipratropium  0.5 mg Nebulization Q6H  . isosorbide mononitrate  60 mg Oral Daily  . levalbuterol  0.63 mg Nebulization Q6H  . loratadine  10 mg Oral Daily  . methylPREDNISolone (SOLU-MEDROL) injection  60 mg Intravenous Q8H  . sodium chloride flush  3 mL Intravenous Q12H  . traZODone  100 mg Oral QHS   Continuous Infusions:  PRN Meds: acetaminophen **OR** acetaminophen, bisacodyl, cyclobenzaprine, HYDROcodone-acetaminophen, ipratropium, levalbuterol, nitroGLYCERIN, ondansetron **OR** ondansetron (ZOFRAN) IV, oxyCODONE-acetaminophen  Allergies:    Allergies  Allergen Reactions  . Codeine Itching    Pt reports tolerating oral opioids.    Social History:   Social History   Socioeconomic History  . Marital status:  Divorced    Spouse name: Not on file  . Number of children: 2  . Years of education: 10   . Highest education level: 10th grade  Occupational History  . Occupation: disabled   Social Needs  . Financial resource strain: Somewhat hard  . Food insecurity:    Worry: Never true    Inability: Never true  . Transportation needs:    Medical: No    Non-medical: No  Tobacco Use  . Smoking status: Current Every Day Smoker    Packs/day: 0.25    Years: 45.00    Pack years: 11.25    Types: Cigarettes  . Smokeless tobacco: Former Systems developer    Types: Chew  . Tobacco comment: patient refused couseling information  Substance and Sexual Activity  . Alcohol use: Not Currently  . Drug use: No  . Sexual activity: Not on file  Lifestyle  . Physical activity:    Days per week: 7 days    Minutes per session: 10 min  . Stress: Only a little  Relationships  . Social connections:    Talks on phone: More than three times a week    Gets together: More than three times a week    Attends religious service: Never    Active member of club or organization: No    Attends meetings of clubs or organizations: Not on file    Relationship status: Divorced  . Intimate partner violence:    Fear of current or ex partner: Not on file    Emotionally abused: Not on file    Physically abused: Not on file    Forced sexual activity: Not on file  Other Topics Concern  . Not on file  Social History Narrative  . Not on file    Family History:    Family History  Problem Relation Age of Onset  .  Hypertension Mother   . Hyperlipidemia Mother   . Depression Mother   . Heart attack Father 90  . Heart disease Father   . Hypertension Father   . Hyperlipidemia Father      ROS:  Please see the history of present illness.  Review of Systems  Constitutional: Positive for chills, fever and malaise/fatigue.  HENT: Positive for congestion.   Respiratory: Positive for cough, shortness of breath and wheezing. Negative  for sputum production.   Cardiovascular: Positive for orthopnea. Negative for chest pain.  Neurological: Positive for weakness.    All other ROS reviewed and negative.     Physical Exam/Data:   Vitals:   09/03/18 2051 09/04/18 0317 09/04/18 0742 09/04/18 0811  BP: 123/78 122/90 (!) 132/95   Pulse: (!) 135 (!) 125 (!) 130   Resp: 18 18 18    Temp: 98 F (36.7 C) 98.6 F (37 C) (!) 97.4 F (36.3 C)   TempSrc: Oral  Oral   SpO2: 98% 97% 98% 97%  Weight: 119.4 kg     Height:       No intake or output data in the 24 hours ending 09/04/18 0925 Filed Weights   09/03/18 1644 09/03/18 2051  Weight: 117.9 kg 119.4 kg   Body mass index is 37.77 kg/m.  General:  Well nourished, well developed, in no acute distress HEENT: normal  Neck: seated in bed and JVD not assessed d/t angle  Vascular: No carotid bruits;  Cardiac:  IRIR, tachycardic rate Lungs:  Bilateral wheezing Abd: soft, nontender, no hepatomegaly  Ext: Minimal bilateral edema Musculoskeletal:  No deformities, BUE and BLE strength normal and equal Skin: warm and dry  Neuro:   no focal abnormalities noted Psych:  Normal affect   EKG:  The EKG was personally reviewed and demonstrates: As above in HPI Telemetry:  Telemetry was personally reviewed and demonstrates: Atrial flutter, rate 140-150s  Relevant CV Studies:  10/2016:  NM Pharmacological myocardial perfusion imaging study with no significant  Ischemia Small region of fixed perfusion defect noted in the inferoapical region on attenuation corrected images Nonattenuation corrected images with moderate sized region of mild ischemia noted in the inferior wall Normal wall motion, EF estimated at 62% No EKG changes concerning for ischemia at peak stress or in recovery. EKG showing frequent APCs Low risk scan  08/2014 Cath insignificant left main, LAD, and RCA disease but significant left circumflex disease.  Ventricular ejection fraction was estimated at 51%.  FFR at  rest was 0.99, after 2 minutes of adenosine IV infusion it was 0.89.   08/24/14 Stress test result Normal ejection fraction, no ischemia, no suggestion of blockage No wall motion abnormality, normal EKG Low risk scan, overall good study  Laboratory Data:  Chemistry Recent Labs  Lab 09/03/18 1707 09/04/18 0312  NA 135 135  K 4.3 5.1  CL 102 103  CO2 23 24  GLUCOSE 376* 342*  BUN 23 20  CREATININE 0.97 0.82  CALCIUM 8.7* 8.6*  GFRNONAA >60 >60  GFRAA >60 >60  ANIONGAP 10 8    No results for input(s): PROT, ALBUMIN, AST, ALT, ALKPHOS, BILITOT in the last 168 hours. Hematology Recent Labs  Lab 09/03/18 1707 09/04/18 0312  WBC 9.0 8.8  RBC 3.95* 4.05*  HGB 13.8 13.9  HCT 41.3 42.9  MCV 104.6* 105.9*  MCH 34.9* 34.3*  MCHC 33.4 32.4  RDW 15.8* 15.3  PLT 122* 107*   Cardiac Enzymes Recent Labs  Lab 09/03/18 1707  TROPONINI <0.03  No results for input(s): TROPIPOC in the last 168 hours.  BNPNo results for input(s): BNP, PROBNP in the last 168 hours.  DDimer No results for input(s): DDIMER in the last 168 hours.  Radiology/Studies:  Dg Chest 2 View  Result Date: 09/03/2018 CLINICAL DATA:  Dry cough and dyspnea x5 days EXAM: CHEST - 2 VIEW COMPARISON:  CXR 04/11/2018 FINDINGS: Stable cardiomegaly with mild uncoiling of the thoracic aorta. No thoracic aortic aneurysm. There is atelectasis at the left lung base. Remote left-sided rib fractures are noted. No acute osseous abnormality is seen. Mild degenerative change is noted along the dorsal spine. Pulmonary vasculature is unremarkable. IMPRESSION: No active cardiopulmonary disease.  Stable mild cardiomegaly. Electronically Signed   By: Ashley Royalty M.D.   On: 09/03/2018 18:09    Assessment and Plan:   Atrial flutter with rapid ventricular rate -Significantly short of breath with etiology of SOB and trigger for atrial flutter with elevated rates likely multifactorial and including pulmonary infection with underlying  COPD exacerbation all contributing to his symptoms.  He also continues to smoke. -CHA2DS2VASc score of at least 5 (CHF, HTN,, age, DM2, vascular). Patient is anticoagulated with PTA medication 5 mg Eliquis twice daily -He received diltiazem in the ED which dropped his blood pressure but did not help his rates. --Scheduled for DCCV tomorrow 12/20 as he is reportedly compliant with anticoagulation on Eliquis.  Continue medical management with Eliquis, Imdur, and nitro as needed for chest pain.  Patient will need to continue anticoagulation S/P cardioversion with follow-up CBC and be met in the a.m.  Continue Xopenex as preferred in the setting of elevated heart rate. - Further recommendations following cardioversion 12/20  Acute on chronic COPD exacerbation with bronchitis - Continue steroids, antibiotics, bronchodilator treatments -Oxygen as needed -Per IM  Diastolic CHF -Monitor blood pressure, volume status, and rates and titrate medication as needed -BMET to monitor renal function and electrolytes. Monitor fluids with I/O, daily weights.  CAD -No reported chest pain.  Troponin negative.  Continue medical management  Hypertension -Controlled.  Hypotensive this admission.  Continue medical management.  Hyperlipidemia - Recheck LDL.  Last checked 10/2016 and 120, not at goal of <70 --Continue atorvastatin 20 mg.   Update lipid labs and titrate atorvastatin as needed.  DM 2 -SSI - Per IM  Current tobacco abuse -Counseled against -Refusing nicotine patch at this time    For questions or updates, please contact [place] Please consult [email or web address] for contact info under     Signed, Arvil Chaco, PA-C  09/04/2018 9:25 AM

## 2018-09-04 NOTE — Progress Notes (Signed)
Family Meeting Note  Advance Directive:yes  Today a meeting took place with the Patient.    The following clinical team members were present during this meeting:MD  The following were discussed:Patient's diagnosis: Atrial flutter, rapid ventricular rate , cute on chronic COPD exacerbation with bronchitis, diabetes mellitus, end-stage renal disease, hyperlipidemia , hypertension Treatment plan of care discussed in detail with the patient.    Patient's progosis: Unable to determine and Goals for treatment: Full Code,  Daughter Zenith Lamphier  Additional follow-up to be provided: Hospitalist, cardiology  Time spent during discussion:30min  Nicholes Mango, MD

## 2018-09-04 NOTE — Plan of Care (Signed)
  Problem: Health Behavior/Discharge Planning: Goal: Ability to manage health-related needs will improve Outcome: Progressing Note:  Patient for cardioversion tomorrow. Consent obtained. Orders released. Will go NPO at midnight. Will continue to monitor. Wenda Low [facility]

## 2018-09-04 NOTE — Progress Notes (Signed)
Inpatient Diabetes Program Recommendations  AACE/ADA: New Consensus Statement on Inpatient Glycemic Control (2015)  Target Ranges:  Prepandial:   less than 140 mg/dL      Peak postprandial:   less than 180 mg/dL (1-2 hours)      Critically ill patients:  140 - 180 mg/dL   Lab Results  Component Value Date   GLUCAP 297 (H) 09/04/2018   HGBA1C 7.2 (A) 07/22/2018    Review of Glycemic Control Results for SYLAS, TWOMBLY (MRN [id number]) as of 09/04/2018 10:48  Ref. Range 09/03/2018 21:36 09/04/2018 07:39  Glucose-Capillary Latest Ref Range: 70 - 99 mg/dL 330 (H) 297 (H)   Diabetes history: DM 2 Outpatient Diabetes medications:  Amaryl 2 mg q supper, Metformin 500 mg bid Current orders for Inpatient glycemic control:  Novolog moderate tid with meals and HS, Solumedrol 60 mg IV q 8 hours  Inpatient Diabetes Program Recommendations:    Note that patient is NPO.  Consider increasing Novolog correction to q 4 hours while NPO.  Also blood sugars are elevated with steroids.  May consider adding Levemir 10 units bid while in the hospital and on steroids.   Thanks  Adah Perl, RN, BC-ADM Inpatient Diabetes Coordinator Pager [phone] (8a-5p)

## 2018-09-05 ENCOUNTER — Encounter
Admission: EM | Disposition: A | Discharge status: Home / Self Care | Payer: Self-pay | Source: Home / Self Care | Attending: Internal Medicine

## 2018-09-05 ENCOUNTER — Encounter: Payer: Self-pay | Admitting: Anesthesiology

## 2018-09-05 ENCOUNTER — Inpatient Hospital Stay: Payer: Medicare HMO | Admitting: Anesthesiology

## 2018-09-05 DIAGNOSIS — I4892 Unspecified atrial flutter: Secondary | ICD-10-CM

## 2018-09-05 HISTORY — PX: CARDIOVERSION: EP1203

## 2018-09-05 LAB — CBC WITH DIFFERENTIAL/PLATELET
Abs Immature Granulocytes: 0.08 10*3/uL — ABNORMAL HIGH (ref 0.00–0.07)
Basophils Absolute: 0 10*3/uL (ref 0.0–0.1)
Basophils Relative: 0 %
Eosinophils Absolute: 0 10*3/uL (ref 0.0–0.5)
Eosinophils Relative: 0 %
HCT: 43.7 % (ref 39.0–52.0)
Hemoglobin: 13.9 g/dL (ref 13.0–17.0)
Immature Granulocytes: 1 %
Lymphocytes Relative: 7 %
Lymphs Abs: 0.8 10*3/uL (ref 0.7–4.0)
MCH: 34 pg (ref 26.0–34.0)
MCHC: 31.8 g/dL (ref 30.0–36.0)
MCV: 106.8 fL — ABNORMAL HIGH (ref 80.0–100.0)
Monocytes Absolute: 0.4 10*3/uL (ref 0.1–1.0)
Monocytes Relative: 3 %
Neutro Abs: 10.3 10*3/uL — ABNORMAL HIGH (ref 1.7–7.7)
Neutrophils Relative %: 89 %
Platelets: 123 10*3/uL — ABNORMAL LOW (ref 150–400)
RBC: 4.09 MIL/uL — ABNORMAL LOW (ref 4.22–5.81)
RDW: 15.1 % (ref 11.5–15.5)
WBC: 11.5 10*3/uL — ABNORMAL HIGH (ref 4.0–10.5)
nRBC: 0 % (ref 0.0–0.2)

## 2018-09-05 LAB — BASIC METABOLIC PANEL
Anion gap: 9 (ref 5–15)
BUN: 27 mg/dL — ABNORMAL HIGH (ref 8–23)
CO2: 24 mmol/L (ref 22–32)
Calcium: 8.8 mg/dL — ABNORMAL LOW (ref 8.9–10.3)
Chloride: 103 mmol/L (ref 98–111)
Creatinine, Ser: 0.82 mg/dL (ref 0.61–1.24)
GFR calc Af Amer: >60 mL/min (ref >60)
Glucose, Bld: 298 mg/dL — ABNORMAL HIGH (ref 70–99)
POTASSIUM: 4.8 mmol/L (ref 3.5–5.1)
Sodium: 136 mmol/L (ref 135–145)

## 2018-09-05 LAB — HIV ANTIBODY (ROUTINE TESTING W REFLEX): HIV Screen 4th Generation wRfx: NONREACTIVE

## 2018-09-05 LAB — GLUCOSE, CAPILLARY
Glucose-Capillary: 270 mg/dL — ABNORMAL HIGH (ref 70–99)
Glucose-Capillary: 253 mg/dL — ABNORMAL HIGH (ref 70–99)
Glucose-Capillary: 256 mg/dL — ABNORMAL HIGH (ref 70–99)
Glucose-Capillary: 214 mg/dL — ABNORMAL HIGH (ref 70–99)

## 2018-09-05 SURGERY — CARDIOVERSION (CATH LAB)
Anesthesia: General

## 2018-09-05 MED ORDER — BISACODYL 5 MG PO TBEC: 5.0000 mg | DELAYED_RELEASE_TABLET | Freq: Every day | ORAL | 0 refills | Status: DC | PRN

## 2018-09-05 MED ORDER — PROPOFOL 10 MG/ML IV BOLUS
INTRAVENOUS | Status: DC | PRN
  Administered 2018-09-05: 60 mg via INTRAVENOUS
  Administered 2018-09-05: 20 mg via INTRAVENOUS

## 2018-09-05 MED ORDER — OXYCODONE-ACETAMINOPHEN 7.5-325 MG PO TABS: 1.0000 | ORAL_TABLET | Freq: Three times a day (TID) | ORAL | 0 refills | Status: DC | PRN

## 2018-09-05 MED ORDER — PROPOFOL 10 MG/ML IV BOLUS: INTRAVENOUS | Status: DC | PRN

## 2018-09-05 MED ORDER — PREDNISONE 10 MG (21) PO TBPK: 10.0000 mg | ORAL_TABLET | Freq: Every day | ORAL | 0 refills | Status: DC

## 2018-09-05 NOTE — Transfer of Care (Signed)
Immediate Anesthesia Transfer of Care Note  Patient: Lawrence Steele  Procedure(s) Performed: CARDIOVERSION (N/A )  Patient Location: PACU and Cath Lab  Anesthesia Type:General  Level of Consciousness: awake, alert  and oriented  Airway & Oxygen Therapy: Patient Spontanous Breathing and Patient connected to nasal cannula oxygen  Post-op Assessment: Report given to RN and Post -op Vital signs reviewed and stable  Post vital signs: Reviewed and stable  Last Vitals:  Vitals Value Taken Time  BP 109/57 09/05/2018 11:13 AM  Temp    Pulse    Resp    SpO2      Last Pain:  Vitals:   09/05/18 0952  TempSrc: Oral  PainSc: 0-No pain      Patients Stated Pain Goal: 3 (71/59/53 [id number])  Complications: No apparent anesthesia complications

## 2018-09-05 NOTE — Progress Notes (Signed)
Progress Note  Patient Name: Lawrence Steele Date of Encounter: 09/05/2018  Primary Cardiologist: [facility]  Subjective   "no better on the cough and congestion" Still with SOB "I am going home after cardioversion, I have ABX at home"  Inpatient Medications    Scheduled Meds: . [MAR Hold] allopurinol  100 mg Oral BID  . [MAR Hold] apixaban  5 mg Oral BID  . [MAR Hold] atenolol  50 mg Oral Daily  . [MAR Hold] atorvastatin  20 mg Oral Daily  . [MAR Hold] benzonatate  200 mg Oral TID  . [MAR Hold] budesonide (PULMICORT) nebulizer solution  0.25 mg Nebulization BID  . [MAR Hold] docusate sodium  100 mg Oral BID  . [MAR Hold] doxycycline  100 mg Oral Q12H  . [MAR Hold] escitalopram  5 mg Oral Daily  . [MAR Hold] gabapentin  400 mg Oral TID  . [MAR Hold] insulin aspart  0-15 Units Subcutaneous Q4H  . [MAR Hold] insulin detemir  10 Units Subcutaneous BID  . [MAR Hold] ipratropium  0.5 mg Nebulization Q6H  . [MAR Hold] isosorbide mononitrate  60 mg Oral Daily  . [MAR Hold] levalbuterol  0.63 mg Nebulization Q6H  . [MAR Hold] loratadine  10 mg Oral Daily  . [MAR Hold] methylPREDNISolone (SOLU-MEDROL) injection  60 mg Intravenous Q8H  . [MAR Hold] mometasone-formoterol  2 puff Inhalation BID  . [MAR Hold] sodium chloride flush  3 mL Intravenous Q12H  . [MAR Hold] sodium chloride flush  3 mL Intravenous Q12H  . [MAR Hold] traZODone  100 mg Oral QHS   Continuous Infusions: . sodium chloride 250 mL (09/04/18 1115)   PRN Meds: [MAR Hold] acetaminophen **OR** [MAR Hold] acetaminophen, [MAR Hold] bisacodyl, [MAR Hold] cyclobenzaprine, [MAR Hold] HYDROcodone-acetaminophen, [MAR Hold] ipratropium, [MAR Hold] levalbuterol, [MAR Hold] nitroGLYCERIN, [MAR Hold] ondansetron **OR** [MAR Hold] ondansetron (ZOFRAN) IV, [MAR Hold] oxyCODONE-acetaminophen, [MAR Hold] sodium chloride flush   Vital Signs    Vitals:   09/05/18 0526 09/05/18 0801 09/05/18 0809 09/05/18 0952  BP: 134/84  122/90  114/79  Pulse: (!) 111  (!) 123 (!) 134  Resp:    20  Temp: 97.8 F (36.6 C)   98.3 F (36.8 C)  TempSrc: Oral   Oral  SpO2: 99% 97% 100% 98%  Weight: 117 kg     Height:        Intake/Output Summary (Last 24 hours) at 09/05/2018 1115 Last data filed at 09/05/2018 1101 Gross per 24 hour  Intake 50 ml  Output 1275 ml  Net -1225 ml   Filed Weights   09/03/18 1644 09/03/18 2051 09/05/18 0526  Weight: 117.9 kg 119.4 kg 117 kg    Telemetry    Atrial flutter, converted to NSR with cardioversion - Personally Reviewed  ECG     - Personally Reviewed  Physical Exam   GEN: No acute distress.   Neck: No JVD Cardiac: RRR, no murmurs, rubs, or gallops.  Respiratory: Coarse breath sounds GI: Soft, nontender, non-distended  MS: No edema; No deformity. Neuro:  Nonfocal  Psych: Normal affect   Labs    Chemistry Recent Labs  Lab 09/03/18 1707 09/04/18 0312 09/05/18 0321  NA 135 135 136  K 4.3 5.1 4.8  CL 102 103 103  CO2 23 24 24   GLUCOSE 376* 342* 298*  BUN 23 20 27*  CREATININE 0.97 0.82 0.82  CALCIUM 8.7* 8.6* 8.8*  GFRNONAA >60 >60 >60  GFRAA >60 >60 >60  ANIONGAP 10 8 9  Hematology Recent Labs  Lab 09/03/18 1707 09/04/18 0312 09/05/18 0321  WBC 9.0 8.8 11.5*  RBC 3.95* 4.05* 4.09*  HGB 13.8 13.9 13.9  HCT 41.3 42.9 43.7  MCV 104.6* 105.9* 106.8*  MCH 34.9* 34.3* 34.0  MCHC 33.4 32.4 31.8  RDW 15.8* 15.3 15.1  PLT 122* 107* 123*    Cardiac Enzymes Recent Labs  Lab 09/03/18 1707  TROPONINI <0.03   No results for input(s): TROPIPOC in the last 168 hours.   BNPNo results for input(s): BNP, PROBNP in the last 168 hours.   DDimer No results for input(s): DDIMER in the last 168 hours.   Radiology    Dg Chest 2 View  Result Date: 09/03/2018 CLINICAL DATA:  Dry cough and dyspnea x5 days EXAM: CHEST - 2 VIEW COMPARISON:  CXR 04/11/2018 FINDINGS: Stable cardiomegaly with mild uncoiling of the thoracic aorta. No thoracic aortic aneurysm. There  is atelectasis at the left lung base. Remote left-sided rib fractures are noted. No acute osseous abnormality is seen. Mild degenerative change is noted along the dorsal spine. Pulmonary vasculature is unremarkable. IMPRESSION: No active cardiopulmonary disease.  Stable mild cardiomegaly. Electronically Signed   By: Ashley Royalty M.D.   On: 09/03/2018 18:09    Cardiac Studies     Patient Profile     67 year old gentleman with history of atrial flutter prior cardioversion, coronary artery disease, diastolic CHF, COPD, active smoker,AAA with graft stent placement, diabetes type 2, morbid obesity Presenting by her referral to the ER from his pulmonologist after measurements of heart rates in the 140 range Reports developing upper respiratory infection over the past week  Assessment & Plan    A/P: Atrial flutter with rapid ventricular rate Cardioversion this AM,  NSR restored Stay on eliquis 5 BID Eliquis Trigger for the arrhythmia likely recent upper respiratory infection in the past week Started on antibiotics --High risk of recurrent arrhythmia given continued URI  Acute on chronic COPD exacerbation/bronchitis Would continue antibiotics, bronchodilators, steroids Smoking cessation recommended May feel better in NSR  CAD with stable angina Denies chest pain concerning for ischemia  Diastolic CHF: High risk given atrial flutter On nasal canula oxygen Will try to wean on the floor. For hypoxia, may need home oxygen   Total encounter time more than 25 minutes  Greater than 50% was spent in counseling and coordination of care with the patient    For questions or updates, please contact [facility] Please consult [email or web address] for contact info under        Signed, Ida Rogue, MD  09/05/2018, 11:15 AM

## 2018-09-05 NOTE — CV Procedure (Signed)
Cardioversion procedure note For atrial flutter, typical  Procedure Details:  Consent: Risks of procedure as well as the alternatives and risks of each were explained to the (patient/caregiver). Consent for procedure obtained.  Time Out: Verified patient identification, verified procedure, site/side was marked, verified correct patient position, special equipment/implants available, medications/allergies/relevent history reviewed, required imaging and test results available. Performed  Patient placed on cardiac monitor, pulse oximetry, supplemental oxygen as necessary.  Sedation given: propofol IV, Dr. Kayleen Memos Pacer pads placed anterior and posterior chest.   Cardioverted 1 time(s).  Cardioverted at  150 J. Synchronized biphasic Converted to NSR   Evaluation: Findings: Post procedure EKG shows: NSR Complications: None Patient did tolerate procedure well.  Time Spent Directly with the Patient:  50 minutes   Esmond Plants, M.D., Ph.D.

## 2018-09-05 NOTE — Care Management Important Message (Signed)
Copy of signed Medicare IM left in patient's room (out for procedure).  

## 2018-09-05 NOTE — Discharge Instructions (Signed)
Follow-up with primary care physician in 5 days Follow-up with Dr. Rockey Situ in 1 week

## 2018-09-05 NOTE — Progress Notes (Signed)
Pt adamant about going home today, MD notified, MD [facility] OK to discharge patient, VSS, IV removed, follow up information including prescriptions and medication regimen information provided. Pt taken downstairs for ride home with sister via wheelchair.

## 2018-09-05 NOTE — Anesthesia Postprocedure Evaluation (Signed)
Anesthesia Post Note  Patient: Lawrence Steele  Procedure(s) Performed: CARDIOVERSION (N/A )  Patient location during evaluation: Cath Lab Anesthesia Type: General Level of consciousness: awake and alert and oriented Pain management: pain level controlled Vital Signs Assessment: post-procedure vital signs reviewed and stable Respiratory status: spontaneous breathing Cardiovascular status: blood pressure returned to baseline Anesthetic complications: no     Last Vitals:  Vitals:   09/05/18 1104 09/05/18 1113  BP: 116/71 (!) 109/57  Pulse:    Resp:    Temp:    SpO2:      Last Pain:  Vitals:   09/05/18 0952  TempSrc: Oral  PainSc: 0-No pain                 Rawlins Stuard

## 2018-09-05 NOTE — Anesthesia Preprocedure Evaluation (Signed)
Anesthesia Evaluation  Patient identified by MRN, date of birth, ID band Patient awake    Reviewed: Allergy & Precautions, H&P , NPO status , Patient's Chart, lab work & pertinent test results, reviewed documented beta blocker date and time   History of Anesthesia Complications Negative for: history of anesthetic complications  Airway Mallampati: I  TM Distance: >3 FB Neck ROM: full    Dental  (+) Dental Advidsory Given, Caps, Teeth Intact Permanent bridge on top right:   Pulmonary shortness of breath (with a fib) and with exertion, asthma , sleep apnea and Continuous Positive Airway Pressure Ventilation , COPD,  COPD inhaler, neg recent URI, Current Smoker,           Cardiovascular Exercise Tolerance: Good hypertension, + angina (with a fib) + CAD, + Past MI, + Cardiac Stents, + Peripheral Vascular Disease and +CHF  (-) CABG + dysrhythmias Atrial Fibrillation (-) Valvular Problems/Murmurs     Neuro/Psych PSYCHIATRIC DISORDERS Anxiety negative neurological ROS     GI/Hepatic negative GI ROS, Neg liver ROS,   Endo/Other  diabetes  Renal/GU CRF and ESRFRenal disease  negative genitourinary   Musculoskeletal   Abdominal   Peds  Hematology negative hematology ROS (+)   Anesthesia Other Findings Past Medical History: No date: AAA (abdominal aortic aneurysm) ([facility]) No date: AAA (abdominal aortic aneurysm) ([place]) No date: Anxiety No date: Asthma No date: CHF (congestive heart failure) (HCC) No date: COPD (chronic obstructive pulmonary disease) (H[facility]) No date: Coronary artery disease No date: Diabetes mellitus without complication (HCC) No date: ESRD (end stage renal disease) ([place]) No date: Hyperlipidemia No date: Hypertension No date: Iliac artery aneurysm, bilateral (HCC) No date: Obstructive sleep apnea-hypopnea syndrome No date: Sleep apnea   Reproductive/Obstetrics negative OB ROS                              Anesthesia Physical  Anesthesia Plan  ASA: IV  Anesthesia Plan: General   Post-op Pain Management:    Induction: Intravenous  PONV Risk Score and Plan: 1 and TIVA  Airway Management Planned: Nasal Cannula  Additional Equipment:   Intra-op Plan:   Post-operative Plan:   Informed Consent: I have reviewed the patients History and Physical, chart, labs and discussed the procedure including the risks, benefits and alternatives for the proposed anesthesia with the patient or authorized representative who has indicated his/her understanding and acceptance.   Dental Advisory Given  Plan Discussed with: Anesthesiologist, CRNA and Surgeon  Anesthesia Plan Comments:         Anesthesia Quick Evaluation

## 2018-09-05 NOTE — Anesthesia Post-op Follow-up Note (Signed)
Anesthesia QCDR form completed.        

## 2018-09-05 NOTE — Discharge Summary (Signed)
[facility] NAME: Lawrence Steele    MR#:  [id number]  DATE OF BIRTH:  Oct 10, 1950  DATE OF ADMISSION:  09/03/2018 ADMITTING PHYSICIAN: Sedalia Muta, MD  DATE OF DISCHARGE: 09/05/18  PRIMARY CARE PHYSICIAN: Ronnell Freshwater, NP    ADMISSION DIAGNOSIS:  Atrial flutter with rapid ventricular response ([place]) [I48.92] COPD with acute exacerbation ([facility]) [J44.1]  DISCHARGE DIAGNOSIS:  Active Problems:   Atrial flutter ([place]) Acute COPD  SECONDARY DIAGNOSIS:   Past Medical History:  Diagnosis Date  . AAA (abdominal aortic aneurysm) ([place])   . AAA (abdominal aortic aneurysm) ([place])   . Anxiety   . Asthma   . CHF (congestive heart failure) ([place])   . COPD (chronic obstructive pulmonary disease) ([place])   . Coronary artery disease   . Diabetes mellitus without complication ([place])   . ESRD (end stage renal disease) ([place])   . Hyperlipidemia   . Hypertension   . Iliac artery aneurysm, bilateral ([place])   . Obstructive sleep apnea-hypopnea syndrome   . Sleep apnea     HOSPITAL COURSE:   HPI  Lawrence Steele  is a 67 y.o. male with a known history listed below presented to emergency room for evaluation of palpitation and shortness of breath.  Patient has palpitation on and off since last 5 days.  Patient also noticed shortness of breath with a dry cough.  He denies chest pain or tightness.  No dizziness or syncope.  He feels congested and wheezy.  He went to pulmonary clinic today and heart rate was elevated.  Patient transferred to emergency room for further evaluation and treatment.  Patient denies fever or chills.  No other complaints.  In emergency room patient heart rate was in 140s.  Patient is in a flutter.  ER physician has discussed case with cardiology who recommended n.p.o. after midnight for possible cardioversion in the morning.  Patient received prednisone and breathing treatment in the emergency room.  Hospitalist team  requested for admission.  1.Acute on chronic COPD exacerbation with bronchitis: Clinically improving  Taper IV Solu-Medrol to p.o. prednisone Doxycycline  Bronchodilator treatments  Chronically lives on 2 L of oxygen at home will continue the same  2.A flutter:Rapid ventricular rate. Seen by Dr. Rockey Situ status post cardioversion today successful  Okay to discharge patient from cardiology standpoint  Continue Eliquis his home medication   3.Diabetes with hyperglycemia:resume home meds   4.Chronic other medical problems:Monitor. Continue home medication as ordered  5.  Tobacco abuse disorder counseled patient to quit smoking for 5 minutes.  He verbalized understanding but refusing nicotine patch  No history of end-stage renal disease as per patient's report  DVT prophylaxis:On full dose of anticoagulation with apixaban  DISCHARGE CONDITIONS:   Stable   CONSULTS OBTAINED:  Treatment Team:  Minna Merritts, MD   PROCEDURES cardioversion  DRUG ALLERGIES:   Allergies  Allergen Reactions  . Codeine Itching    Pt reports tolerating oral opioids.    DISCHARGE MEDICATIONS:   Allergies as of 09/05/2018      Reactions   Codeine Itching   Pt reports tolerating oral opioids.      Medication List    STOP taking these medications   predniSONE 10 MG tablet Commonly known as:  DELTASONE Replaced by:  predniSONE 10 MG (21) Tbpk tablet     TAKE these medications   ACCU-CHEK AVIVA PLUS VI by In Vitro route. Pt needs accu check Aviva plus lancets,  test strips and meter to check blood sugars once daily   glucose blood test strip Commonly known as:  ACCU-CHEK AVIVA PLUS 1 each by Other route as needed for other. Use as instructed   ACCU-CHEK AVIVA PLUS w/Device Kit Use once daily to check blood sugar.   ACCU-CHEK SOFTCLIX LANCETS lancets Use as instructed  Once a daily E11.65   acetaminophen 500 MG tablet Commonly known as:  TYLENOL Take 500  mg by mouth every 6 (six) hours as needed.   albuterol 108 (90 Base) MCG/ACT inhaler Commonly known as:  PROAIR HFA Inhale 2 puffs into the lungs every 6 (six) hours as needed.   allopurinol 100 MG tablet Commonly known as:  ZYLOPRIM Take 1 tablet (100 mg total) by mouth 2 (two) times daily.   apixaban 5 MG Tabs tablet Commonly known as:  ELIQUIS Take 1 tablet (5 mg total) by mouth 2 (two) times daily.   atenolol 50 MG tablet Commonly known as:  TENORMIN Take 1 tablet (50 mg total) by mouth daily.   atorvastatin 20 MG tablet Commonly known as:  LIPITOR Take 1 tablet (20 mg total) by mouth daily.   azithromycin 250 MG tablet Commonly known as:  ZITHROMAX Take as directed.   bisacodyl 5 MG EC tablet Commonly known as:  DULCOLAX Take 1 tablet (5 mg total) by mouth daily as needed for moderate constipation.   cyclobenzaprine 10 MG tablet Commonly known as:  FLEXERIL Take 1 tablet (10 mg total) by mouth 2 (two) times daily as needed for muscle spasms. What changed:    how to take this  when to take this   diclofenac sodium 1 % Gel Commonly known as:  VOLTAREN Apply 2 g topically 4 (four) times daily.   doxycycline 100 MG tablet Commonly known as:  VIBRA-TABS Take 1 tablet (100 mg total) by mouth daily. For acne.   escitalopram 5 MG tablet Commonly known as:  LEXAPRO Take 1 tablet (5 mg total) by mouth daily.   Fluticasone-Salmeterol 250-50 MCG/DOSE Aepb Commonly known as:  ADVAIR DISKUS Inhale 1 puff into the lungs 2 (two) times daily. Frequency:BID   Dosage:0.0     Instructions:  Note:Dose: 250-50MCG   gabapentin 400 MG capsule Commonly known as:  NEURONTIN Take 400 mg by mouth 3 (three) times daily.   glimepiride 1 MG tablet Commonly known as:  AMARYL Take 2 tablets (2 mg total) by mouth daily with supper. What changed:  when to take this   hydroxypropyl methylcellulose / hypromellose 2.5 % ophthalmic solution Commonly known as:  ISOPTO TEARS /  GONIOVISC Place 1 drop into both eyes daily as needed for dry eyes.   ipratropium 0.02 % nebulizer solution Commonly known as:  ATROVENT Use every 6 hours for Shortness of breath and wheezing and as needed.  Prescription is for 90 days   isosorbide mononitrate 60 MG 24 hr tablet Commonly known as:  IMDUR Take 1 tablet (60 mg total) by mouth daily.   loratadine 10 MG tablet Commonly known as:  CLARITIN Take 10 mg by mouth daily.   meloxicam 7.5 MG tablet Commonly known as:  MOBIC Take 7.5 mg by mouth 2 (two) times daily.   metFORMIN 500 MG tablet Commonly known as:  GLUCOPHAGE Take 1 tablet (500 mg total) by mouth 2 (two) times daily with a meal.   nitroGLYCERIN 0.4 MG SL tablet Commonly known as:  NITROSTAT Place 0.4 mg under the tongue.   NORCO 5-325 MG tablet Generic drug:  HYDROcodone-acetaminophen Take 1 tablet by mouth 2 (two) times daily.   nystatin 100000 UNIT/ML suspension Commonly known as:  MYCOSTATIN Take 5 mLs (500,000 Units total) by mouth 4 (four) times daily.   oxyCODONE-acetaminophen 7.5-325 MG tablet Commonly known as:  PERCOCET Take 1 tablet by mouth every 8 (eight) hours as needed for severe pain. What changed:    when to take this  reasons to take this   OXYGEN Inhale 2 L into the lungs continuous.   predniSONE 10 MG (21) Tbpk tablet Commonly known as:  STERAPRED UNI-PAK 21 TAB Take 1 tablet (10 mg total) by mouth daily. Take 6 tablets by mouth for 1 day followed by  5 tablets by mouth for 1 day followed by  4 tablets by mouth for 1 day followed by  3 tablets by mouth for 1 day followed by  2 tablets by mouth for 1 day followed by  1 tablet by mouth for a day and stop Replaces:  predniSONE 10 MG tablet   traZODone 100 MG tablet Commonly known as:  DESYREL Take 1 tablet (100 mg total) by mouth at bedtime.   umeclidinium bromide 62.5 MCG/INH Aepb Commonly known as:  INCRUSE ELLIPTA Inhale 1 puff into the lungs daily.         DISCHARGE INSTRUCTIONS:   Follow-up with primary care physician in 5 days Follow-up with Dr. Rockey Situ in 1 week  DIET:  Cardiac diet and Diabetic diet  DISCHARGE CONDITION:  Stable  ACTIVITY:  Activity as tolerated  OXYGEN:  Home Oxygen: Yes.     Oxygen Delivery: 2 liters/min via Patient connected to nasal cannula oxygen  DISCHARGE LOCATION:  home   If you experience worsening of your admission symptoms, develop shortness of breath, life threatening emergency, suicidal or homicidal thoughts you must seek medical attention immediately by calling 911 or calling your MD immediately  if symptoms less severe.  You Must read complete instructions/literature along with all the possible adverse reactions/side effects for all the Medicines you take and that have been prescribed to you. Take any new Medicines after you have completely understood and accpet all the possible adverse reactions/side effects.   Please note  You were cared for by a hospitalist during your hospital stay. If you have any questions about your discharge medications or the care you received while you were in the hospital after you are discharged, you can call the unit and asked to speak with the hospitalist on call if the hospitalist that took care of you is not available. Once you are discharged, your primary care physician will handle any further medical issues. Please note that NO REFILLS for any discharge medications will be authorized once you are discharged, as it is imperative that you return to your primary care physician (or establish a relationship with a primary care physician if you do not have one) for your aftercare needs so that they can reassess your need for medications and monitor your lab values.     Today  Chief Complaint  Patient presents with  . Tachycardia   Pt is s/p cardioversion, successful procedure   ROS:   CONSTITUTIONAL: Denies fevers, chills. Denies any fatigue, weakness.   EYES: Denies blurry vision, double vision, eye pain. EARS, NOSE, THROAT: Denies tinnitus, ear pain, hearing loss. RESPIRATORY: Denies cough, wheeze, shortness of breath.  CARDIOVASCULAR: Denies chest pain, palpitations, edema.  GASTROINTESTINAL: Denies nausea, vomiting, diarrhea, abdominal pain. Denies bright red blood per rectum. GENITOURINARY: Denies dysuria, hematuria. ENDOCRINE: Denies nocturia  or thyroid problems. HEMATOLOGIC AND LYMPHATIC: Denies easy bruising or bleeding. SKIN: Denies rash or lesion. MUSCULOSKELETAL: Denies pain in neck, back, shoulder, knees, hips or arthritic symptoms.  NEUROLOGIC: Denies paralysis, paresthesias.  PSYCHIATRIC: Denies anxiety or depressive symptoms.   VITAL SIGNS:  Blood pressure 103/70, pulse 78, temperature 98.3 F (36.8 C), temperature source Oral, resp. rate 18, height 5' 10"  (1.778 m), weight 117 kg, SpO2 97 %.  I/O:    Intake/Output Summary (Last 24 hours) at 09/05/2018 1423 Last data filed at 09/05/2018 1101 Gross per 24 hour  Intake 50 ml  Output 1275 ml  Net -1225 ml    PHYSICAL EXAMINATION:  GENERAL:  67 y.o.-year-old patient lying in the bed with no acute distress.  EYES: Pupils equal, round, reactive to light and accommodation. No scleral icterus. Extraocular muscles intact.  HEENT: Head atraumatic, normocephalic. Oropharynx and nasopharynx clear.  NECK:  Supple, no jugular venous distention. No thyroid enlargement, no tenderness.  LUNGS: Normal breath sounds bilaterally, no wheezing, rales,rhonchi or crepitation. No use of accessory muscles of respiration.  CARDIOVASCULAR: S1, S2 normal. No murmurs, rubs, or gallops.  ABDOMEN: Soft, non-tender, non-distended. Bowel sounds present. No organomegaly or mass.  EXTREMITIES: No pedal edema, cyanosis, or clubbing.  NEUROLOGIC: Cranial nerves II through XII are intact. Muscle strength 5/5 in all extremities. Sensation intact. Gait not checked.  PSYCHIATRIC: The patient is alert  and oriented x 3.  SKIN: No obvious rash, lesion, or ulcer.   DATA REVIEW:   CBC Recent Labs  Lab 09/05/18 0321  WBC 11.5*  HGB 13.9  HCT 43.7  PLT 123*    Chemistries  Recent Labs  Lab 09/05/18 0321  NA 136  K 4.8  CL 103  CO2 24  GLUCOSE 298*  BUN 27*  CREATININE 0.82  CALCIUM 8.8*    Cardiac Enzymes Recent Labs  Lab 09/03/18 1707  TROPONINI <0.03    Microbiology Results  Results for orders placed or performed in visit on 07/22/18  Microscopic Examination     Status: None   Collection Time: 07/22/18 10:37 AM  Result Value Ref Range Status   WBC, UA 0-5 0 - 5 /hpf Final   RBC, UA None seen 0 - 2 /hpf Final   Epithelial Cells (non renal) None seen 0 - 10 /hpf Final   Casts None seen None seen /lpf Final   Mucus, UA Present Not Estab. Final   Bacteria, UA None seen None seen/Few Final    RADIOLOGY:  Dg Chest 2 View  Result Date: 09/03/2018 CLINICAL DATA:  Dry cough and dyspnea x5 days EXAM: CHEST - 2 VIEW COMPARISON:  CXR 04/11/2018 FINDINGS: Stable cardiomegaly with mild uncoiling of the thoracic aorta. No thoracic aortic aneurysm. There is atelectasis at the left lung base. Remote left-sided rib fractures are noted. No acute osseous abnormality is seen. Mild degenerative change is noted along the dorsal spine. Pulmonary vasculature is unremarkable. IMPRESSION: No active cardiopulmonary disease.  Stable mild cardiomegaly. Electronically Signed   By: Ashley Royalty M.D.   On: 09/03/2018 18:09    EKG:   Orders placed or performed during the hospital encounter of 09/03/18  . EKG 12-Lead  . EKG 12-Lead  . EKG 12-Lead  . EKG  . EKG 12-Lead  . EKG 12-Lead  . EKG 12-Lead  . EKG 12-Lead  . EKG 12-Lead  . EKG 12-Lead  . EKG 12-Lead  . EKG 12-Lead  . EKG 12-Lead      Management plans discussed with  the patient, family and they are in agreement.  CODE STATUS:     Code Status Orders  (From admission, onward)         Start     Ordered   09/03/18  2040  Full code  Continuous     09/03/18 2039        Code Status History    This patient has a current code status but no historical code status.      TOTAL TIME TAKING CARE OF THIS PATIENT: 43  minutes.   Note: This dictation was prepared with Dragon dictation along with smaller phrase technology. Any transcriptional errors that result from this process are unintentional.   @[facility] @  on 09/05/2018 at 2:23 PM  Between 7am to 6pm - Pager - [phone]  After 6pm go to [email or web address] - password [email or web address] [facility]  Office  [phone]  CC: Primary care physician; Ronnell Freshwater, NP

## 2018-09-06 ENCOUNTER — Encounter: Payer: Self-pay | Admitting: Adult Health

## 2018-09-08 ENCOUNTER — Encounter: Payer: Self-pay | Admitting: Cardiovascular Disease

## 2018-09-08 ENCOUNTER — Telehealth: Payer: Self-pay

## 2018-09-08 NOTE — Telephone Encounter (Signed)
Flagged on EMMI report for having unfilled prescriptions.  Called and spoke with patient.  He mentioned he was able to fill all his medications.  No questions or issues concerning his discharge, though had a question regarding paying his bill.  Instructed him to call the number listed on his statement once received for Patient Financial Services for different assistance options available.  I thanked him for his time and informed him he would receive one more automated call later this week.

## 2018-09-11 ENCOUNTER — Telehealth: Payer: Self-pay | Admitting: Cardiovascular Disease

## 2018-09-11 ENCOUNTER — Encounter: Payer: Self-pay | Admitting: Adult Health

## 2018-09-11 ENCOUNTER — Ambulatory Visit (INDEPENDENT_AMBULATORY_CARE_PROVIDER_SITE_OTHER): Payer: Medicare HMO | Admitting: Adult Health

## 2018-09-11 VITALS — BP 134/78 | HR 84 | Resp 16 | Ht 70.0 in | Wt 275.0 lb

## 2018-09-11 DIAGNOSIS — J069 Acute upper respiratory infection, unspecified: Secondary | ICD-10-CM

## 2018-09-11 DIAGNOSIS — F17200 Nicotine dependence, unspecified, uncomplicated: Secondary | ICD-10-CM

## 2018-09-11 DIAGNOSIS — E119 Type 2 diabetes mellitus without complications: Secondary | ICD-10-CM | POA: Diagnosis not present

## 2018-09-11 DIAGNOSIS — J441 Chronic obstructive pulmonary disease with (acute) exacerbation: Secondary | ICD-10-CM

## 2018-09-11 MED ORDER — FUROSEMIDE 20 MG PO TABS: 20.0000 mg | ORAL_TABLET | Freq: Every day | ORAL | 3 refills | Status: DC

## 2018-09-11 NOTE — Telephone Encounter (Signed)
Pt c/o swelling: STAT is pt has developed SOB within 24 hours  1) How much weight have you gained and in what time span? Has gained weight but does not have a record of how much.  Patient was 275 at office visit today with PCP which was up about 10 pounds from last PCP visit on 09/03/18   2) If swelling, where is the swelling located? Swelling is located in legs and feet, also stomach is pushing into chest   3) Are you currently taking a fluid pill? no  4) Are you currently SOB? yes  5) Do you have a log of your daily weights (if so, list)? no  6) Have you gained 3 pounds in a day or 5 pounds in a week?   7) Have you traveled recently?    Patient would like to know if there is something that can be prescribed to help keep the fluid down. Please call to discuss

## 2018-09-11 NOTE — Telephone Encounter (Signed)
Spoke with patient and he reports increased weight, swelling to lower legs, bloating, and just overall not feeling well. He requested some sort of pill to help the fluid come off. He did have appt with PCP today and his weight was up. Advised that I would send this to provider for review and confirmed his upcoming appointment next Friday. He states that he does not want to wait until then to get some help for this. Advised that I would call him back with any recommendations. He verbalized understanding with no further questions at this time.

## 2018-09-11 NOTE — Telephone Encounter (Signed)
Recommend patient obtain a scale and weight every morning, write down those weights, and bring to his appointment. I would like to verify he is not back in atrial flutter as well. Heart rate of 84 bpm at the pulmonologist today, so this is less likely, but would still like to see a 12-lead confirming this. We can send in Lasix 20 mg daily with plans to draw labs and assess his fluid status at his appointment next week. I would like for his first dose of Lasix to be 40 mg x 1, then 20 mg daily thereafter starting the next day. Please provide him with ED precautions. I will not send in KCl at this time as his potassium lately has been high-end of normal. Please discuss CHF education, fluid/salt restriction with him.

## 2018-09-11 NOTE — Patient Instructions (Signed)
Chronic Obstructive Pulmonary Disease Chronic obstructive pulmonary disease (COPD) is a long-term (chronic) lung problem. When you have COPD, it is hard for air to get in and out of your lungs. Usually the condition gets worse over time, and your lungs will never return to normal. There are things you can do to keep yourself as healthy as possible.  Your doctor may treat your condition with: ? Medicines. ? Oxygen. ? Lung surgery.  Your doctor may also recommend: ? Rehabilitation. This includes steps to make your body work better. It may involve a team of specialists. ? Quitting smoking, if you smoke. ? Exercise and changes to your diet. ? Comfort measures (palliative care). Follow these instructions at home: Medicines  Take over-the-counter and prescription medicines only as told by your doctor.  Talk to your doctor before taking any cough or allergy medicines. You may need to avoid medicines that cause your lungs to be dry. Lifestyle  If you smoke, stop. Smoking makes the problem worse. If you need help quitting, ask your doctor.  Avoid being around things that make your breathing worse. This may include smoke, chemicals, and fumes.  Stay active, but remember to rest as well.  Learn and use tips on how to relax.  Make sure you get enough sleep. Most adults need at least 7 hours of sleep every night.  Eat healthy foods. Eat smaller meals more often. Rest before meals. Controlled breathing Learn and use tips on how to control your breathing as told by your doctor. Try:  Breathing in (inhaling) through your nose for 1 second. Then, pucker your lips and breath out (exhale) through your lips for 2 seconds.  Putting one hand on your belly (abdomen). Breathe in slowly through your nose for 1 second. Your hand on your belly should move out. Pucker your lips and breathe out slowly through your lips. Your hand on your belly should move in as you breathe out.  Controlled coughing Learn  and use controlled coughing to clear mucus from your lungs. Follow these steps: 1. Lean your head a little forward. 2. Breathe in deeply. 3. Try to hold your breath for 3 seconds. 4. Keep your mouth slightly open while coughing 2 times. 5. Spit any mucus out into a tissue. 6. Rest and do the steps again 1 or 2 times as needed. General instructions  Make sure you get all the shots (vaccines) that your doctor recommends. Ask your doctor about a flu shot and a pneumonia shot.  Use oxygen therapy and pulmonary rehabilitation if told by your doctor. If you need home oxygen therapy, ask your doctor if you should buy a tool to measure your oxygen level (oximeter).  Make a COPD action plan with your doctor. This helps you to know what to do if you feel worse than usual.  Manage any other conditions you have as told by your doctor.  Avoid going outside when it is very hot, cold, or humid.  Avoid people who have a sickness you can catch (contagious).  Keep all follow-up visits as told by your doctor. This is important. Contact a doctor if:  You cough up more mucus than usual.  There is a change in the color or thickness of the mucus.  It is harder to breathe than usual.  Your breathing is faster than usual.  You have trouble sleeping.  You need to use your medicines more often than usual.  You have trouble doing your normal activities such as getting dressed   or walking around the house. Get help right away if:  You have shortness of breath while resting.  You have shortness of breath that stops you from: ? Being able to talk. ? Doing normal activities.  Your chest hurts for longer than 5 minutes.  Your skin color is more blue than usual.  Your pulse oximeter shows that you have low oxygen for longer than 5 minutes.  You have a fever.  You feel too tired to breathe normally. Summary  Chronic obstructive pulmonary disease (COPD) is a long-term lung problem.  The way your  lungs work will never return to normal. Usually the condition gets worse over time. There are things you can do to keep yourself as healthy as possible.  Take over-the-counter and prescription medicines only as told by your doctor.  If you smoke, stop. Smoking makes the problem worse. This information is not intended to replace advice given to you by your health care provider. Make sure you discuss any questions you have with your health care provider. Document Released: 02/20/2008 Document Revised: 10/08/2016 Document Reviewed: 10/08/2016 Elsevier Interactive Patient Education  2019 [employer]  

## 2018-09-11 NOTE — Progress Notes (Signed)
[facility],  [address]  Internal MEDICINE  Office Visit Note  Patient Name: Lawrence Steele  [id number]  [id number]  Date of Service: 09/11/2018     Chief Complaint  Patient presents with  . URI    hospital follow up     HPI Pt is here for recent hospital follow up.  He was admitted to [facility] on December 18 for acute on chronic COPD exacerbation with bronchitis.  Also a flutter with rapid ventricular rate.  While in the hospital he was cardioverted by Dr. Rockey Situ.  They were successful with this cardioversion he was released by cardiology to go home, continuing his Eliquis.  For his COPD exacerbation bronchitis he was given IV Solu-Medrol which caused his blood sugars to increase and he was also sent home on prednisone.  He reports his blood sugar got so high that he had to stop taking his prednisone however he does state that he is feeling better than he was at this time.  He continues to use 2 L of oxygen at home and while he says he still does not feel 100% he feels like he is headed in a good direction.  He is using his inhalers and other medications as prescribed and keeping a close eye on his blood sugar as it returns to normal from the steroid use.  Current Medication: Outpatient Encounter Medications as of 09/11/2018  Medication Sig Note  . ACCU-CHEK SOFTCLIX LANCETS lancets Use as instructed  Once a daily E11.65   . acetaminophen (TYLENOL) 500 MG tablet Take 500 mg by mouth every 6 (six) hours as needed.    Marland Kitchen albuterol (PROAIR HFA) 108 (90 Base) MCG/ACT inhaler Inhale 2 puffs into the lungs every 6 (six) hours as needed.   Marland Kitchen allopurinol (ZYLOPRIM) 100 MG tablet Take 1 tablet (100 mg total) by mouth 2 (two) times daily.   Marland Kitchen apixaban (ELIQUIS) 5 MG TABS tablet Take 1 tablet (5 mg total) by mouth 2 (two) times daily.   Marland Kitchen atenolol (TENORMIN) 50 MG tablet Take 1 tablet (50 mg total) by mouth daily.   Marland Kitchen atorvastatin (LIPITOR) 20 MG tablet Take  1 tablet (20 mg total) by mouth daily.   Marland Kitchen azithromycin (ZITHROMAX) 250 MG tablet Take as directed. 09/03/2018: Pt has prescription but has not yet picked up yet.  . bisacodyl (DULCOLAX) 5 MG EC tablet Take 1 tablet (5 mg total) by mouth daily as needed for moderate constipation.   . Blood Glucose Monitoring Suppl (ACCU-CHEK AVIVA PLUS) w/Device KIT Use once daily to check blood sugar.   . cyclobenzaprine (FLEXERIL) 10 MG tablet Take 1 tablet (10 mg total) by mouth 2 (two) times daily as needed for muscle spasms. (Patient taking differently: 10 mg 2 (two) times daily. )   . diclofenac sodium (VOLTAREN) 1 % GEL Apply 2 g topically 4 (four) times daily.    Marland Kitchen doxycycline (VIBRA-TABS) 100 MG tablet Take 1 tablet (100 mg total) by mouth daily. For acne.   . escitalopram (LEXAPRO) 5 MG tablet Take 1 tablet (5 mg total) by mouth daily.   . Fluticasone-Salmeterol (ADVAIR DISKUS) 250-50 MCG/DOSE AEPB Inhale 1 puff into the lungs 2 (two) times daily. Frequency:BID   Dosage:0.0     Instructions:  Note:Dose: 250-50MCG   . gabapentin (NEURONTIN) 400 MG capsule Take 400 mg by mouth 3 (three) times daily.    Marland Kitchen glimepiride (AMARYL) 1 MG tablet Take 2 tablets (2 mg total) by mouth daily  with supper. (Patient taking differently: Take 2 mg by mouth 2 (two) times daily with a meal. )   . Glucose Blood (ACCU-CHEK AVIVA PLUS VI) by In Vitro route. Pt needs accu check Aviva plus lancets, test strips and meter to check blood sugars once daily   . glucose blood (ACCU-CHEK AVIVA PLUS) test strip 1 each by Other route as needed for other. Use as instructed   . HYDROcodone-acetaminophen (NORCO) 5-325 MG tablet Take 1 tablet by mouth 2 (two) times daily.    . hydroxypropyl methylcellulose / hypromellose (ISOPTO TEARS / GONIOVISC) 2.5 % ophthalmic solution Place 1 drop into both eyes daily as needed for dry eyes.   Marland Kitchen ipratropium (ATROVENT) 0.02 % nebulizer solution Use every 6 hours for Shortness of breath and wheezing and as  needed.  Prescription is for 90 days   . isosorbide mononitrate (IMDUR) 60 MG 24 hr tablet Take 1 tablet (60 mg total) by mouth daily.   Marland Kitchen loratadine (CLARITIN) 10 MG tablet Take 10 mg by mouth daily.    . meloxicam (MOBIC) 7.5 MG tablet Take 7.5 mg by mouth 2 (two) times daily.   . metFORMIN (GLUCOPHAGE) 500 MG tablet Take 1 tablet (500 mg total) by mouth 2 (two) times daily with a meal.   . nitroGLYCERIN (NITROSTAT) 0.4 MG SL tablet Place 0.4 mg under the tongue. 09/03/2018: Pt may need a refill because home medication is expired.   . nystatin (MYCOSTATIN) 100000 UNIT/ML suspension Take 5 mLs (500,000 Units total) by mouth 4 (four) times daily.   Marland Kitchen oxyCODONE-acetaminophen (PERCOCET) 7.5-325 MG tablet Take 1 tablet by mouth every 8 (eight) hours as needed for severe pain.   . OXYGEN Inhale 2 L into the lungs continuous.    . predniSONE (STERAPRED UNI-PAK 21 TAB) 10 MG (21) TBPK tablet Take 1 tablet (10 mg total) by mouth daily. Take 6 tablets by mouth for 1 day followed by  5 tablets by mouth for 1 day followed by  4 tablets by mouth for 1 day followed by  3 tablets by mouth for 1 day followed by  2 tablets by mouth for 1 day followed by  1 tablet by mouth for a day and stop   . traZODone (DESYREL) 100 MG tablet Take 1 tablet (100 mg total) by mouth at bedtime.   Marland Kitchen umeclidinium bromide (INCRUSE ELLIPTA) 62.5 MCG/INH AEPB Inhale 1 puff into the lungs daily.    No facility-administered encounter medications on file as of 09/11/2018.     Surgical History: Past Surgical History:  Procedure Laterality Date  . ABDOMINAL AORTIC ANEURYSM REPAIR  03/06/2015   [place]    . APPENDECTOMY    . CARDIAC CATHETERIZATION  2010,03/16/2003,11/04/2002  . CARDIOVERSION N/A 04/24/2018   Procedure: CARDIOVERSION;  Surgeon: Nelva Bush, MD;  Location: [place] [facility];  Service: Cardiovascular;  Laterality: N/A;  . CARDIOVERSION N/A 09/05/2018   Procedure: CARDIOVERSION;  Surgeon: Minna Merritts, MD;  Location: [facility];  Service: Cardiovascular;  Laterality: N/A;  . CORONARY ANGIOPLASTY  03/16/2003   stent to the RCA  & 2/18/2004stent mid circumflex  . ELBOW SURGERY    . HERNIA REPAIR    . NASAL SINUS SURGERY    . PENILE PROSTHESIS IMPLANT  1995  . PILONIDAL CYST EXCISION    . TEE WITHOUT CARDIOVERSION N/A 04/24/2018   Procedure: TRANSESOPHAGEAL ECHOCARDIOGRAM (TEE);  Surgeon: Nelva Bush, MD;  Location: [facility];  Service: Cardiovascular;  Laterality: N/A;  .  TONSILECTOMY, ADENOIDECTOMY, BILATERAL MYRINGOTOMY AND TUBES    . UMBILICAL HERNIA REPAIR      Medical History: Past Medical History:  Diagnosis Date  . AAA (abdominal aortic aneurysm) ([place])   . AAA (abdominal aortic aneurysm) ([place])   . Anxiety   . Asthma   . CHF (congestive heart failure) ([place])   . COPD (chronic obstructive pulmonary disease) ([place])   . Coronary artery disease   . Diabetes mellitus without complication ([place])   . ESRD (end stage renal disease) ([place])   . Hyperlipidemia   . Hypertension   . Iliac artery aneurysm, bilateral ([place])   . Obstructive sleep apnea-hypopnea syndrome   . Sleep apnea     Family History: Family History  Problem Relation Age of Onset  . Hypertension Mother   . Hyperlipidemia Mother   . Depression Mother   . Heart attack Father 66  . Heart disease Father   . Hypertension Father   . Hyperlipidemia Father     Social History   Socioeconomic History  . Marital status: Divorced    Spouse name: Not on file  . Number of children: 2  . Years of education: 10   . Highest education level: 10th grade  Occupational History  . Occupation: disabled   Social Needs  . Financial resource strain: Somewhat hard  . Food insecurity:    Worry: Never true    Inability: Never true  . Transportation needs:    Medical: No    Non-medical: No  Tobacco Use  . Smoking status: Current Every Day Smoker    Packs/day: 0.25    Years: 45.00    Pack years: 11.25    Types:  Cigarettes  . Smokeless tobacco: Former Systems developer    Types: Chew  . Tobacco comment: pt hasnt smoked lately due to health but states he still smokes  Substance and Sexual Activity  . Alcohol use: Not Currently  . Drug use: No  . Sexual activity: Not on file  Lifestyle  . Physical activity:    Days per week: 7 days    Minutes per session: 10 min  . Stress: Only a little  Relationships  . Social connections:    Talks on phone: More than three times a week    Gets together: More than three times a week    Attends religious service: Never    Active member of club or organization: No    Attends meetings of clubs or organizations: Not on file    Relationship status: Divorced  . Intimate partner violence:    Fear of current or ex partner: Not on file    Emotionally abused: Not on file    Physically abused: Not on file    Forced sexual activity: Not on file  Other Topics Concern  . Not on file  Social History Narrative  . Not on file      Review of Systems  Constitutional: Negative.  Negative for chills, fatigue and unexpected weight change.  HENT: Negative.  Negative for congestion, rhinorrhea, sneezing and sore throat.   Eyes: Negative for redness.  Respiratory: Negative.  Negative for cough, chest tightness and shortness of breath.   Cardiovascular: Negative.  Negative for chest pain and palpitations.  Gastrointestinal: Negative.  Negative for abdominal pain, constipation, diarrhea, nausea and vomiting.  Endocrine: Negative.   Genitourinary: Negative.  Negative for dysuria and frequency.  Musculoskeletal: Negative.  Negative for arthralgias, back pain, joint swelling and neck pain.  Skin: Negative.  Negative  for rash.  Allergic/Immunologic: Negative.   Neurological: Negative.  Negative for tremors and numbness.  Hematological: Negative for adenopathy. Does not bruise/bleed easily.  Psychiatric/Behavioral: Negative.  Negative for behavioral problems, sleep disturbance and  suicidal ideas. The patient is not nervous/anxious.     Vital Signs: BP 134/78 (BP Location: Left Arm, Patient Position: Sitting, Cuff Size: Large)   Pulse 84   Resp 16   Ht _0  (1.778 m)   Wt 275 lb (124.7 kg)   SpO2 90%   BMI 39.46 kg/m    Physical Exam Vitals signs and nursing note reviewed.  Constitutional:      General: He is not in acute distress.    Appearance: He is well-developed. He is not diaphoretic.  HENT:     Head: Normocephalic and atraumatic.     Mouth/Throat:     Pharynx: No oropharyngeal exudate.  Eyes:     Pupils: Pupils are equal, round, and reactive to light.  Neck:     Musculoskeletal: Normal range of motion and neck supple.     Thyroid: No thyromegaly.     Vascular: No JVD.     Trachea: No tracheal deviation.  Cardiovascular:     Rate and Rhythm: Normal rate and regular rhythm.     Heart sounds: Normal heart sounds. No murmur. No friction rub. No gallop.   Pulmonary:     Effort: Pulmonary effort is normal. No respiratory distress.     Breath sounds: Normal breath sounds. No wheezing or rales.  Chest:     Chest wall: No tenderness.  Abdominal:     Palpations: Abdomen is soft.     Tenderness: There is no abdominal tenderness. There is no guarding.  Musculoskeletal: Normal range of motion.  Lymphadenopathy:     Cervical: No cervical adenopathy.  Skin:    General: Skin is warm and dry.  Neurological:     Mental Status: He is alert and oriented to person, place, and time.     Cranial Nerves: No cranial nerve deficit.  Psychiatric:        Behavior: Behavior normal.        Thought Content: Thought content normal.        Judgment: Judgment normal.    Assessment/Plan: 1. COPD exacerbation ([place]) Continue using oxygen as before.  Patient should take all medications as prescribed.  Return to clinic if symptoms fail to improve over the next 4 days.  Patient may need follow-up chest x-ray.  2. Upper respiratory tract infection, unspecified  type Appears to be resolving however still has some wheezing noted.  Patient will continue taking his medication using his inhalers and will follow-up in office if symptoms fail to improve.  3. Diabetes mellitus without complication (HCC) Stable, patient's pressure is elevated due to steroids he is keeping a close eye on him and will return to clinic if they do not normalize.  4. Nicotine dependence with current use Smoking cessation counseling: 1. Pt acknowledges the risks of long term smoking, she will try to quite smoking. 2. Options for different medications including nicotine products, chewing gum, patch etc, Wellbutrin and Chantix is discussed 3. Goal and date of compete cessation is discussed 4. Total time spent in smoking cessation is 15 min.   General Counseling: Jacorey verbalizes understanding of the findings of todays visit and agrees with plan of treatment. I have discussed any further diagnostic evaluation that may be needed or ordered today. We also reviewed his medications today. he has been  encouraged to call the office with any questions or concerns that should arise related to todays visit.    No orders of the defined types were placed in this encounter.     I have reviewed all medical records from hospital follow up including radiology reports and consults from other physicians. Appropriate follow up diagnostics will be scheduled as needed. Patient/ Family understands the plan of treatment.  Time spent 25 minutes.   Orson Gear AGNP-C Internal Medicine

## 2018-09-11 NOTE — Telephone Encounter (Signed)
Spoke with patient and reviewed provider recommendations. Patient was agreeable and sent in furosemide 20 mg once daily with instructions to take 2 tablets (40 mg) on day one tomorrow and then go to 1 tablet (20 mg) to once daily. Reviewed fluid/salt restrictions, instructions to weigh daily and record for upcoming appointment, and confirmed upcoming appointment. He states that he does not have a scale at home and recommended that he please obtain one and keep list of readings for his upcoming appointment. He verbalized understanding of instructions, agreement with plan, and had no further questions at this time.

## 2018-09-13 ENCOUNTER — Other Ambulatory Visit: Payer: Self-pay | Admitting: Nurse Practitioner

## 2018-09-13 DIAGNOSIS — R197 Diarrhea, unspecified: Secondary | ICD-10-CM

## 2018-09-13 DIAGNOSIS — K580 Irritable bowel syndrome with diarrhea: Secondary | ICD-10-CM

## 2018-09-13 MED ORDER — CIPROFLOXACIN HCL 500 MG PO TABS: 500.0000 mg | ORAL_TABLET | Freq: Two times a day (BID) | ORAL | 0 refills | Status: DC

## 2018-09-13 MED ORDER — DICYCLOMINE HCL 10 MG PO CAPS: 10.0000 mg | ORAL_CAPSULE | Freq: Three times a day (TID) | ORAL | 3 refills | Status: DC

## 2018-09-13 NOTE — Progress Notes (Signed)
For complaint of diverticulitis, sent in prescription for cipro 500mg  twice daily for 10 days and added dicyclomine 10mg  to take three times daily as needed and as indicated for intestinal cramping. Both were sent to [facility] on [address].

## 2018-09-18 ENCOUNTER — Encounter: Payer: Self-pay | Admitting: Physician Assistant

## 2018-09-18 NOTE — Progress Notes (Signed)
Cardiology Office Note Date:  09/19/2018  Patient ID:  Lawrence, Steele April 10, 1951, MRN [id number] PCP:  Lawrence Freshwater, NP  Cardiologist:  Dr. Rockey Situ, MD    Chief Complaint: Hospital follow-up  History of Present Illness: Lawrence Steele is a 68 y.o. male with history of CAD as outlined below, atrial flutter on Eliquis status post TEE/DCCV 04/2018 with repeat DCCV in 08/2018, AAA status post endovascular repair at [facility] in 2016, HFpEF, COPD with ongoing tobacco abuse on chronic oxygen at nighttime, DM2, HTN, HLD with statin intolerance, alcohol abuse, low back pain with peripheral neuropathy with previous falls, and morbid obesity with OSA and possible OHS who presents for hospital follow-up from his recent admission to [facility] from 12/18 through 12/20 for atrial flutter with RVR in the setting of COPD exacerbation with bronchitis.  Past surgical history indicates the patient has previously undergone PCI to the RCA and left circumflex in 2004.  Details of this are unclear.  Patient most recently underwent cardiac cath on 01/18/2009 that showed left main 10% stenosis, mid LAD-1 lesion 20% stenosis, mid LAD-2 lesion 20% stenosis, D1 20% stenosis, mid LCx-1 lesion 30% stenosis, mid LCx-2 lesion 50% stenosis, OM1 20% stenosis, OM to 20% stenosis, LPL 20% stenosis, proximal RCA-1 lesion 20% stenosis, proximal RCA-2 lesion 20% stenosis.  EF 51% with mild inferior hypokinesis.  Echo at [facility] from 2015 showed normal LV systolic function, dilated thoracic aorta measuring 4 cm at the sinus of Valsalva and 3.6 cm above the sinotubular junction, LVH, otherwise unrevealing study.  Patient most recently underwent ischemic evaluation via nuclear stress test in 10/2016 that showed no significant ischemia with a small region of fixed perfusion defect noted in the inferior apical region on attenuation corrected images.  EF estimated at 62%.  He was noted to have frequent PACs.  This was a low risk scan.  Patient was diagnosed  with atrial flutter with RVR in 03/2018.  He was started on Eliquis at that time.  He underwent TEE guided cardioversion on 04/24/2018 that was successful.  TEE showed an EF of 55 to 60%.  Most recent CT abdomen done at [facility] and 09/2015 showed stable aortoiliac endograft with decreased excluded aneurysm size.  Stable bilateral common iliac artery aneurysms, stable anterior abdominal wall hernia containing nonobstructed small bowel, anterior bladder, and penile prosthesis reservoir.  Patient was admitted on 12/18 after developing an upper respiratory infection the week prior and was seen by his pulmonologist with a heart rate in the 140s beats per minute.  He was sent to the ED where he was noted to be in atrial flutter with RVR.  He reported compliance with Eliquis.  Cardiac enzymes negative.  Labs were largely unrevealing outside of hyperglycemia and a mildly elevated WBC on day of discharge.  Chest x-ray showed no active cardiopulmonary disease with stable cardiomegaly.  The patient underwent successful cardioversion on 09/05/2018.  Patient called our office on 12/26 noting significant lower extremity swelling and weight gain.  Weight at outside office was noted to be 275 at that time.  Patient was uncertain what his weight was at home as he did not have a scale at that time.  Upon reviewing his prior office visit with [place] it appears his weight is anywhere between 250 and 260 pounds.  He was started on Lasix and advised to pick up a scale.  When patient obtain a scale on 12/27 his home weight was 284 pounds.  Following outpatient diuresis with Lasix  his weight has improved to 262 pounds as of 09/19/2018.  He continues to note some lower extremity swelling though significantly improved.  He also notes some abdominal fullness and shortness of breath.  He denies any recent dietary changes.  He reports good urine output with the Lasix.  He has a stable 2 pillow orthopnea and denies any PND or early satiety.  He has not  felt any further symptoms concerning for atrial flutter.  He has noted some chest pain that occurred at rest and lasted 1 to 2 minutes and spontaneously resolved.  He is currently chest pain-free.  He reports compliance with his nocturnal oxygen.  He continues to smoke tobacco and is uninterested in quitting.  Past Medical History:  Diagnosis Date  . AAA (abdominal aortic aneurysm) ([place])    a.  Status post endovascular repair at [facility] in 2016  . Anxiety   . Asthma   . Atrial flutter ([place])    a. diagnosed 7/19; b. CHADS2VASc => 5 (CHF, HTN, age x 1, DM, vascular disease); c. on Eliquis; d. s/p TEE/DCCV 8/19 with repeat DCCV 12/19  . Chronic diastolic CHF (congestive heart failure) ([place])   . COPD (chronic obstructive pulmonary disease) ([place])   . Coronary artery disease    a. LHC 5/10: left main 10%, mLAD-1 lesion 20%, mLAD-2 lesion 20%, D1 20%, mLCx-1 lesion 30%, mLCx-2 lesion 50%, OM1 20%, OM to 20%, LPL 20%, pRCA-1 lesion 20%, pRCA-2 lesion 20%.  EF 51% w/ mild inf HK  . Diabetes mellitus with complication ([place])   . Hyperlipidemia   . Hypertension   . Iliac artery aneurysm, bilateral ([place])   . Obstructive sleep apnea-hypopnea syndrome     Past Surgical History:  Procedure Laterality Date  . ABDOMINAL AORTIC ANEURYSM REPAIR  03/06/2015   [place]    . APPENDECTOMY    . CARDIAC CATHETERIZATION  2010,03/16/2003,11/04/2002  . CARDIOVERSION N/A 04/24/2018   Procedure: CARDIOVERSION;  Surgeon: Lawrence Bush, MD;  Location: [place] [facility];  Service: Cardiovascular;  Laterality: N/A;  . CARDIOVERSION N/A 09/05/2018   Procedure: CARDIOVERSION;  Surgeon: Lawrence Merritts, MD;  Location: [facility];  Service: Cardiovascular;  Laterality: N/A;  . CORONARY ANGIOPLASTY  03/16/2003   stent to the RCA  & 2/18/2004stent mid circumflex  . ELBOW SURGERY    . HERNIA REPAIR    . NASAL SINUS SURGERY    . PENILE PROSTHESIS IMPLANT  1995  . PILONIDAL CYST EXCISION    . TEE WITHOUT  CARDIOVERSION N/A 04/24/2018   Procedure: TRANSESOPHAGEAL ECHOCARDIOGRAM (TEE);  Surgeon: Lawrence Bush, MD;  Location: [facility];  Service: Cardiovascular;  Laterality: N/A;  . TONSILECTOMY, ADENOIDECTOMY, BILATERAL MYRINGOTOMY AND TUBES    . UMBILICAL HERNIA REPAIR      Current Meds  Medication Sig  . ACCU-CHEK SOFTCLIX LANCETS lancets Use as instructed  Once a daily E11.65  . acetaminophen (TYLENOL) 500 MG tablet Take 500 mg by mouth every 6 (six) hours as needed.   Marland Kitchen albuterol (PROAIR HFA) 108 (90 Base) MCG/ACT inhaler Inhale 2 puffs into the lungs every 6 (six) hours as needed.  Marland Kitchen allopurinol (ZYLOPRIM) 100 MG tablet Take 1 tablet (100 mg total) by mouth 2 (two) times daily.  Marland Kitchen apixaban (ELIQUIS) 5 MG TABS tablet Take 1 tablet (5 mg total) by mouth 2 (two) times daily.  Marland Kitchen atenolol (TENORMIN) 50 MG tablet Take 1 tablet (50 mg total) by mouth daily.  Marland Kitchen atorvastatin (LIPITOR) 20 MG tablet Take 1  tablet (20 mg total) by mouth daily.  . bisacodyl (DULCOLAX) 5 MG EC tablet Take 1 tablet (5 mg total) by mouth daily as needed for moderate constipation.  . Blood Glucose Monitoring Suppl (ACCU-CHEK AVIVA PLUS) w/Device KIT Use once daily to check blood sugar.  . ciprofloxacin (CIPRO) 500 MG tablet Take 1 tablet (500 mg total) by mouth 2 (two) times daily.  . cyclobenzaprine (FLEXERIL) 10 MG tablet Take 1 tablet (10 mg total) by mouth 2 (two) times daily as needed for muscle spasms. (Patient taking differently: 10 mg 2 (two) times daily. )  . diclofenac sodium (VOLTAREN) 1 % GEL Apply 2 g topically 4 (four) times daily.   Marland Kitchen dicyclomine (BENTYL) 10 MG capsule Take 1 capsule (10 mg total) by mouth 4 (four) times daily -  before meals and at bedtime.  Marland Kitchen doxycycline (VIBRA-TABS) 100 MG tablet Take 1 tablet (100 mg total) by mouth daily. For acne.  . escitalopram (LEXAPRO) 5 MG tablet Take 1 tablet (5 mg total) by mouth daily.  . Fluticasone-Salmeterol (ADVAIR DISKUS) 250-50 MCG/DOSE AEPB Inhale 1 puff  into the lungs 2 (two) times daily. Frequency:BID   Dosage:0.0     Instructions:  Note:Dose: 250-50MCG  . furosemide (LASIX) 20 MG tablet Take 1 tablet (20 mg total) by mouth daily.  Marland Kitchen gabapentin (NEURONTIN) 400 MG capsule Take 400 mg by mouth 3 (three) times daily.   Marland Kitchen glimepiride (AMARYL) 1 MG tablet Take 2 tablets (2 mg total) by mouth daily with supper. (Patient taking differently: Take 2 mg by mouth 2 (two) times daily with a meal. )  . Glucose Blood (ACCU-CHEK AVIVA PLUS VI) by In Vitro route. Pt needs accu check Aviva plus lancets, test strips and meter to check blood sugars once daily  . glucose blood (ACCU-CHEK AVIVA PLUS) test strip 1 each by Other route as needed for other. Use as instructed  . HYDROcodone-acetaminophen (NORCO) 5-325 MG tablet Take 1 tablet by mouth 2 (two) times daily.   . hydroxypropyl methylcellulose / hypromellose (ISOPTO TEARS / GONIOVISC) 2.5 % ophthalmic solution Place 1 drop into both eyes daily as needed for dry eyes.  Marland Kitchen ipratropium (ATROVENT) 0.02 % nebulizer solution Use every 6 hours for Shortness of breath and wheezing and as needed.  Prescription is for 90 days  . isosorbide mononitrate (IMDUR) 60 MG 24 hr tablet Take 1 tablet (60 mg total) by mouth daily.  Marland Kitchen loratadine (CLARITIN) 10 MG tablet Take 10 mg by mouth daily.   . meloxicam (MOBIC) 7.5 MG tablet Take 7.5 mg by mouth 2 (two) times daily.  . metFORMIN (GLUCOPHAGE) 500 MG tablet Take 1 tablet (500 mg total) by mouth 2 (two) times daily with a meal.  . nitroGLYCERIN (NITROSTAT) 0.4 MG SL tablet Place 0.4 mg under the tongue.  . nystatin (MYCOSTATIN) 100000 UNIT/ML suspension Take 5 mLs (500,000 Units total) by mouth 4 (four) times daily.  Marland Kitchen oxyCODONE-acetaminophen (PERCOCET) 7.5-325 MG tablet Take 1 tablet by mouth every 8 (eight) hours as needed for severe pain.  . OXYGEN Inhale 2 L into the lungs continuous.   . predniSONE (STERAPRED UNI-PAK 21 TAB) 10 MG (21) TBPK tablet Take 1 tablet (10 mg total)  by mouth daily. Take 6 tablets by mouth for 1 day followed by  5 tablets by mouth for 1 day followed by  4 tablets by mouth for 1 day followed by  3 tablets by mouth for 1 day followed by  2 tablets by mouth for 1  day followed by  1 tablet by mouth for a day and stop  . traZODone (DESYREL) 100 MG tablet Take 1 tablet (100 mg total) by mouth at bedtime.  Marland Kitchen umeclidinium bromide (INCRUSE ELLIPTA) 62.5 MCG/INH AEPB Inhale 1 puff into the lungs daily.    Allergies:   Codeine   Social History:  The patient  reports that he has been smoking cigarettes. He has a 11.25 pack-year smoking history. He has quit using smokeless tobacco.  His smokeless tobacco use included chew. He reports previous alcohol use. He reports that he does not use drugs.   Family History:  The patient's family history includes Depression in his mother; Heart attack (age of onset: 5) in his father; Heart disease in his father; Hyperlipidemia in his father and mother; Hypertension in his father and mother.  ROS:   Review of Systems  Constitutional: Positive for malaise/fatigue. Negative for chills, diaphoresis, fever and weight loss.  HENT: Negative for congestion.   Eyes: Negative for discharge and redness.  Respiratory: Positive for shortness of breath. Negative for cough, hemoptysis, sputum production and wheezing.   Cardiovascular: Positive for chest pain and leg swelling. Negative for palpitations, orthopnea, claudication and PND.  Gastrointestinal: Positive for abdominal pain. Negative for blood in stool, heartburn, melena, nausea and vomiting.  Genitourinary: Negative for hematuria.  Musculoskeletal: Negative for falls and myalgias.  Skin: Negative for rash.  Neurological: Positive for weakness. Negative for dizziness, tingling, tremors, sensory change, speech change, focal weakness and loss of consciousness.  Endo/Heme/Allergies: Does not bruise/bleed easily.  Psychiatric/Behavioral: Negative for substance abuse. The  patient is not nervous/anxious.   All other systems reviewed and are negative.    PHYSICAL EXAM:  VS:  BP 100/60 (BP Location: Left Arm, Patient Position: Sitting, Cuff Size: Normal)   Pulse 66   Ht 5' 10"  (1.778 m)   Wt 265 lb 12 oz (120.5 kg)   BMI 38.13 kg/m  BMI: Body mass index is 38.13 kg/m.  Physical Exam  Constitutional: He is oriented to person, place, and time. He appears well-developed and well-nourished.  HENT:  Head: Normocephalic and atraumatic.  Eyes: Right eye exhibits no discharge. Left eye exhibits no discharge.  Neck: Normal range of motion. No JVD present.  JVD difficult to assess secondary to body habitus.  Cardiovascular: Normal rate, regular rhythm, S1 normal, S2 normal and normal heart sounds. Exam reveals no distant heart sounds, no friction rub, no midsystolic click and no opening snap.  No murmur heard. Pulses:      Posterior tibial pulses are 2+ on the right side and 2+ on the left side.  Pulmonary/Chest: Effort normal. No respiratory distress. He has decreased breath sounds. He has wheezes. He has no rales. He exhibits no tenderness.  Abdominal: Soft. He exhibits no distension. There is no abdominal tenderness.  Musculoskeletal:        General: Edema present.     Comments: Trace bilateral lower extremity pitting edema to the mid shins  Neurological: He is alert and oriented to person, place, and time.  Skin: Skin is warm and dry. No cyanosis. Nails show no clubbing.  Psychiatric: He has a normal mood and affect. His speech is normal and behavior is normal. Judgment and thought content normal.     EKG:  Was ordered and interpreted by me today. Shows NSR, 67 bpm, first-degree AV block, no acute ST-T changes  Recent Labs: 04/11/2018: ALT 15 09/05/2018: BUN 27; Creatinine, Ser 0.82; Hemoglobin 13.9; Platelets 123; Potassium 4.8;  Sodium 136  No results found for requested labs within last 8760 hours.   Estimated Creatinine Clearance: 113.8 mL/min (by  C-G formula based on SCr of 0.82 mg/dL).   Wt Readings from Last 3 Encounters:  09/19/18 265 lb 12 oz (120.5 kg)  09/11/18 275 lb (124.7 kg)  09/05/18 257 lb 14.4 oz (117 kg)     Other studies reviewed: Additional studies/records reviewed today include: summarized above  ASSESSMENT AND PLAN:  1. Acute on chronic diastolic CHF: Likely exacerbated by recent atrial flutter with RVR as well as acute COPD exacerbation with recent bronchitis.  He has demonstrated good outpatient diuresis with Lasix as outlined above with a weight decrease of 22 pounds over the past 1 week.  He does still appear somewhat volume up though significantly improved.  We will increase his Lasix to 20 mg twice daily for the next 3 days followed by resumption of 20 mg daily thereafter.  Check CMP and CBC today.  Replete potassium as indicated.  Check echocardiogram.  CHF education.   2. Atrial flutter: Maintaining sinus rhythm following recent DCCV in 08/2018.  Continue atenolol for rate control and Eliquis given his CHADS2VASc of at least 5 (CHF, HTN, age x1, DM, vascular disease).  If he has recurrence of atrial flutter consider EP evaluation for atrial flutter ablation.  3. CAD involving the native coronary arteries with stable angina: Has had an isolated episode of atypical chest pain.  Plan for diuresis as above and if symptoms persist would pursue ischemic evaluation and follow-up.  Remains on Eliquis in place of aspirin.  Continue Imdur, atenolol, Lipitor, and sublingual nitroglycerin as needed.  4. COPD with ongoing tobacco abuse: Cannot exclude exacerbation.  Recommend he follow-up with PCP or pulmonologist.  Complete cessation of tobacco abuse is recommended.  5. AAA: Status post endovascular repair.  Followed by [facility].  Follow-up as directed.  6. Hypertension: Blood pressure is on the soft side today.  Remains on atenolol and Lasix as above.  7. Hyperlipidemia: Tolerating Lipitor.  Most recent LDL of 120 from  10/2016.  Recommend fasting lipid panel in follow-up.  8. Morbid obesity: Weight loss is advised.   Disposition: F/u with Dr. Rockey Steele or APP in 2 weeks.  Current medicines are reviewed at length with the patient today.  The patient did not have any concerns regarding medicines.  Signed, Lawrence Faith, PA-C 09/19/2018 10:16 AM     [facility] [address] [place], [place] [address] [phone]

## 2018-09-19 ENCOUNTER — Telehealth: Payer: Self-pay | Admitting: Cardiovascular Disease

## 2018-09-19 ENCOUNTER — Ambulatory Visit (INDEPENDENT_AMBULATORY_CARE_PROVIDER_SITE_OTHER): Payer: Medicare HMO | Admitting: Physician Assistant

## 2018-09-19 ENCOUNTER — Encounter: Payer: Self-pay | Admitting: Physician Assistant

## 2018-09-19 VITALS — BP 100/60 | HR 66 | Ht 70.0 in | Wt 265.8 lb

## 2018-09-19 DIAGNOSIS — I483 Typical atrial flutter: Secondary | ICD-10-CM | POA: Diagnosis not present

## 2018-09-19 DIAGNOSIS — E782 Mixed hyperlipidemia: Secondary | ICD-10-CM

## 2018-09-19 DIAGNOSIS — I714 Abdominal aortic aneurysm, without rupture, unspecified: Secondary | ICD-10-CM

## 2018-09-19 DIAGNOSIS — J449 Chronic obstructive pulmonary disease, unspecified: Secondary | ICD-10-CM

## 2018-09-19 DIAGNOSIS — I10 Essential (primary) hypertension: Secondary | ICD-10-CM

## 2018-09-19 DIAGNOSIS — I5033 Acute on chronic diastolic (congestive) heart failure: Secondary | ICD-10-CM | POA: Diagnosis not present

## 2018-09-19 DIAGNOSIS — I25111 Atherosclerotic heart disease of native coronary artery with angina pectoris with documented spasm: Secondary | ICD-10-CM

## 2018-09-19 DIAGNOSIS — I25118 Atherosclerotic heart disease of native coronary artery with other forms of angina pectoris: Secondary | ICD-10-CM

## 2018-09-19 NOTE — Telephone Encounter (Signed)
Per Check out today patient needs 2 wk fu echo .  Scheduled for echo 1/23 next available.  Patient wants to know if it is ok to see Thurmond Butts after the echo but over 2 wks.  Please advise if ov can be moved to a later s/p echo date.

## 2018-09-19 NOTE — Patient Instructions (Signed)
Medication Instructions:  Your physician has recommended you make the following change in your medication:  1- INCREASE Lasix 1 tablet (20 mg) twice daily for 3 days, then resume 1 tablet (20 mg).  If you need a refill on your cardiac medications before your next appointment, please call your pharmacy.   Lab work: Your physician recommends that you return for lab work today (CMET, CBC)   If you have labs (blood work) drawn today and your tests are completely normal, you will receive your results only by: Marland Kitchen MyChart Message (if you have MyChart) OR . A paper copy in the mail If you have any lab test that is abnormal or we need to change your treatment, we will call you to review the results.  Testing/Procedures: 1- ECHO  Your physician has requested that you have an echocardiogram. Echocardiography is a painless test that uses sound waves to create images of your heart. It provides your doctor with information about the size and shape of your heart and how well your heart's chambers and valves are working. This procedure takes approximately one hour. There are no restrictions for this procedure.    Follow-Up: At [facility], you and your health needs are our priority.  As part of our continuing mission to provide you with exceptional heart care, we have created designated Provider Care Teams.  These Care Teams include your primary Cardiologist (physician) and Advanced Practice Providers (APPs -  Physician Assistants and Nurse Practitioners) who all work together to provide you with the care you need, when you need it. You will need a follow up appointment in 2 weeks. You may see Dr. Rockey Situ or one of the following Advanced Practice Providers on your designated Care Team:   Murray Hodgkins, NP Christell Faith, PA-C . Marrianne Mood, PA-C

## 2018-09-19 NOTE — Telephone Encounter (Signed)
Message to scheduling regarding OV can follow Echo. Verbal from [employer], [place].

## 2018-09-20 LAB — COMPREHENSIVE METABOLIC PANEL
ALT: 19 IU/L (ref 0–44)
AST: 16 IU/L (ref 0–40)
Albumin/Globulin Ratio: 1.1 — ABNORMAL LOW (ref 1.2–2.2)
Albumin: 3.7 g/dL (ref 3.6–4.8)
Alkaline Phosphatase: 75 IU/L (ref 39–117)
BILIRUBIN TOTAL: 0.3 mg/dL (ref 0.0–1.2)
BUN/Creatinine Ratio: 10 (ref 10–24)
BUN: 9 mg/dL (ref 8–27)
CO2: 23 mmol/L (ref 20–29)
Calcium: 8.8 mg/dL (ref 8.6–10.2)
Chloride: 102 mmol/L (ref 96–106)
Creatinine, Ser: 0.93 mg/dL (ref 0.76–1.27)
GFR calc non Af Amer: 85 mL/min/{1.73_m2} (ref >59)
GFR, EST AFRICAN AMERICAN: 98 mL/min/{1.73_m2} (ref >59)
Globulin, Total: 3.3 g/dL (ref 1.5–4.5)
Glucose: 235 mg/dL — ABNORMAL HIGH (ref 65–99)
Potassium: 4.7 mmol/L (ref 3.5–5.2)
Sodium: 139 mmol/L (ref 134–144)
Total Protein: 7 g/dL (ref 6.0–8.5)

## 2018-09-20 LAB — CBC
Hematocrit: 37 % — ABNORMAL LOW (ref 37.5–51.0)
Hemoglobin: 12.9 g/dL — ABNORMAL LOW (ref 13.0–17.7)
MCH: 35.1 pg — ABNORMAL HIGH (ref 26.6–33.0)
MCHC: 34.9 g/dL (ref 31.5–35.7)
MCV: 101 fL — ABNORMAL HIGH (ref 79–97)
Platelets: 179 10*3/uL (ref 150–450)
RBC: 3.67 x10E6/uL — ABNORMAL LOW (ref 4.14–5.80)
RDW: 14.4 % (ref 12.3–15.4)
WBC: 9.2 10*3/uL (ref 3.4–10.8)

## 2018-09-22 ENCOUNTER — Telehealth: Payer: Self-pay

## 2018-09-22 NOTE — Telephone Encounter (Signed)
Call attempted, VM not avail

## 2018-09-22 NOTE — Telephone Encounter (Signed)
-----   Message from Rise Mu, PA-C sent at 09/20/2018  3:48 PM EST ----- Glucose is poorly controlled. Follow up with PCP.  Renal function is normal with outpatient diuresis. Potassium remains at goal.  Liver function is normal.  Blood count is slightly low. He should follow up with his PCP for this.

## 2018-09-22 NOTE — Telephone Encounter (Signed)
I spoke with the patient regarding his CMET/ CBC results. He verbalized understanding. Copy of labs were forwarded to his PCP via Epic.

## 2018-09-29 ENCOUNTER — Other Ambulatory Visit: Payer: Self-pay

## 2018-09-29 MED ORDER — CYCLOBENZAPRINE HCL 10 MG PO TABS: 10.0000 mg | ORAL_TABLET | Freq: Two times a day (BID) | ORAL | 2 refills | Status: DC | PRN

## 2018-09-29 MED ORDER — MELOXICAM 7.5 MG PO TABS: 7.5000 mg | ORAL_TABLET | Freq: Two times a day (BID) | ORAL | 3 refills | Status: DC

## 2018-09-30 ENCOUNTER — Other Ambulatory Visit: Payer: Self-pay | Admitting: *Deleted

## 2018-09-30 NOTE — Patient Outreach (Signed)
[facility]) Care Management  09/30/2018  Lawrence Steele [birth year] [id number]   RN Health Coach attempted follow up outreach call to patient.  Patient was unavailable. No voicemail message pick up. Plan: RN will call patient again within 30 days.  [facility] Care Management [phone]

## 2018-09-30 NOTE — Patient Outreach (Signed)
[facility]) Care Management  09/30/2018   Lawrence Steele 05-Nov-1950 [id number]  RN Health Coach telephone call to patient.  [employer] compliance verified. Per patient he is doing fair. Patient is getting meals on wheels. Per patient he is trying to watch the carbohydrates from the meals and not eat the noodles. Patient stated he eats out when he is going to the Dr office..  Patient had been in the hospital for Atrial fib conversion in December. Patient also has COPD with episode of bronchitis. Per patient he had been on steroids and his blood sugars have been elevated, Patient has CHF and have been monitoring his weight and taking lasix bid. Per patient he does not drive. He uses the transit transportation system. Patient is on O2 and uses an old CPAP that was his brother. Patient in trying to get another CPAP. Patient stated the Dr has written a prescription and he will be able to order one he can afford tomorrow, Per patient the housing conditions he is living in has mold and bats in the attic. RN Health Coach referred to Education officer, museum. Patient stated that he is unable to read small print and needs it written large. RN will increase the size on all educational material sent and go over it. Patient has agreed to follow up outreach calls.   Current Medications:  Current Outpatient Medications  Medication Sig Dispense Refill  . ACCU-CHEK SOFTCLIX LANCETS lancets Use as instructed  Once a daily E11.65 100 each 1  . acetaminophen (TYLENOL) 500 MG tablet Take 500 mg by mouth every 6 (six) hours as needed.     Marland Kitchen albuterol (PROAIR HFA) 108 (90 Base) MCG/ACT inhaler Inhale 2 puffs into the lungs every 6 (six) hours as needed. 3 Inhaler 3  . allopurinol (ZYLOPRIM) 100 MG tablet Take 1 tablet (100 mg total) by mouth 2 (two) times daily. 180 tablet 1  . apixaban (ELIQUIS) 5 MG TABS tablet Take 1 tablet (5 mg total) by mouth 2 (two) times daily. 60 tablet 3  . atenolol (TENORMIN) 50 MG tablet  Take 1 tablet (50 mg total) by mouth daily. 90 tablet 3  . atorvastatin (LIPITOR) 20 MG tablet Take 1 tablet (20 mg total) by mouth daily. 30 tablet 11  . bisacodyl (DULCOLAX) 5 MG EC tablet Take 1 tablet (5 mg total) by mouth daily as needed for moderate constipation. 30 tablet 0  . Blood Glucose Monitoring Suppl (ACCU-CHEK AVIVA PLUS) w/Device KIT Use once daily to check blood sugar. 1 kit 3  . ciprofloxacin (CIPRO) 500 MG tablet Take 1 tablet (500 mg total) by mouth 2 (two) times daily. 20 tablet 0  . cyclobenzaprine (FLEXERIL) 10 MG tablet Take 1 tablet (10 mg total) by mouth 2 (two) times daily as needed for muscle spasms. 60 tablet 2  . diclofenac sodium (VOLTAREN) 1 % GEL Apply 2 g topically 4 (four) times daily.     Marland Kitchen dicyclomine (BENTYL) 10 MG capsule Take 1 capsule (10 mg total) by mouth 4 (four) times daily -  before meals and at bedtime. 45 capsule 3  . doxycycline (VIBRA-TABS) 100 MG tablet Take 1 tablet (100 mg total) by mouth daily. For acne. 30 tablet 4  . escitalopram (LEXAPRO) 5 MG tablet Take 1 tablet (5 mg total) by mouth daily. 30 tablet 3  . Fluticasone-Salmeterol (ADVAIR DISKUS) 250-50 MCG/DOSE AEPB Inhale 1 puff into the lungs 2 (two) times daily. Frequency:BID   Dosage:0.0     Instructions:  Note:Dose: 250-50MCG 3 each 3  . furosemide (LASIX) 20 MG tablet Take 1 tablet (20 mg total) by mouth daily. 90 tablet 3  . gabapentin (NEURONTIN) 400 MG capsule Take 400 mg by mouth 3 (three) times daily.     Marland Kitchen glimepiride (AMARYL) 1 MG tablet Take 2 tablets (2 mg total) by mouth daily with supper. (Patient taking differently: Take 2 mg by mouth 2 (two) times daily with a meal. ) 180 tablet 1  . Glucose Blood (ACCU-CHEK AVIVA PLUS VI) by In Vitro route. Pt needs accu check Aviva plus lancets, test strips and meter to check blood sugars once daily    . glucose blood (ACCU-CHEK AVIVA PLUS) test strip 1 each by Other route as needed for other. Use as instructed 100 each 3  .  HYDROcodone-acetaminophen (NORCO) 5-325 MG tablet Take 1 tablet by mouth 2 (two) times daily.     . hydroxypropyl methylcellulose / hypromellose (ISOPTO TEARS / GONIOVISC) 2.5 % ophthalmic solution Place 1 drop into both eyes daily as needed for dry eyes.    Marland Kitchen ipratropium (ATROVENT) 0.02 % nebulizer solution Use every 6 hours for Shortness of breath and wheezing and as needed.  Prescription is for 90 days 900 mL 1  . isosorbide mononitrate (IMDUR) 60 MG 24 hr tablet Take 1 tablet (60 mg total) by mouth daily. 90 tablet 1  . loratadine (CLARITIN) 10 MG tablet Take 10 mg by mouth daily.     . meloxicam (MOBIC) 7.5 MG tablet Take 1 tablet (7.5 mg total) by mouth 2 (two) times daily. 60 tablet 3  . metFORMIN (GLUCOPHAGE) 500 MG tablet Take 1 tablet (500 mg total) by mouth 2 (two) times daily with a meal. 180 tablet 1  . nitroGLYCERIN (NITROSTAT) 0.4 MG SL tablet Place 0.4 mg under the tongue.    . nystatin (MYCOSTATIN) 100000 UNIT/ML suspension Take 5 mLs (500,000 Units total) by mouth 4 (four) times daily. 200 mL 2  . oxyCODONE-acetaminophen (PERCOCET) 7.5-325 MG tablet Take 1 tablet by mouth every 8 (eight) hours as needed for severe pain. 30 tablet 0  . OXYGEN Inhale 2 L into the lungs continuous.     . predniSONE (STERAPRED UNI-PAK 21 TAB) 10 MG (21) TBPK tablet Take 1 tablet (10 mg total) by mouth daily. Take 6 tablets by mouth for 1 day followed by  5 tablets by mouth for 1 day followed by  4 tablets by mouth for 1 day followed by  3 tablets by mouth for 1 day followed by  2 tablets by mouth for 1 day followed by  1 tablet by mouth for a day and stop 21 tablet 0  . traZODone (DESYREL) 100 MG tablet Take 1 tablet (100 mg total) by mouth at bedtime. 30 tablet 3  . umeclidinium bromide (INCRUSE ELLIPTA) 62.5 MCG/INH AEPB Inhale 1 puff into the lungs daily. 3 each 3   No current facility-administered medications for this visit.     Functional Status:  In your present state of health, do you  have any difficulty performing the following activities: 09/03/2018 09/03/2018  Hearing? - N  Vision? - N  Difficulty concentrating or making decisions? - N  Walking or climbing stairs? - N  Dressing or bathing? - N  Doing errands, shopping? N -  Preparing Food and eating ? - -  Using the Toilet? - -  In the past six months, have you accidently leaked urine? - -  Do you have problems with loss of  bowel control? - -  Managing your Medications? - -  Managing your Finances? - -  Comment - -  Housekeeping or managing your Housekeeping? - -  Some recent data might be hidden    Fall/Depression Screening: Fall Risk  09/30/2018 09/11/2018 07/22/2018  Falls in the past year? 1 1 0  Number falls in past yr: 0 0 -  Injury with Fall? 1 1 -  Comment - bruising on the arm -  Risk for fall due to : History of fall(s);Impaired balance/gait;Impaired mobility - -  Follow up Falls evaluation completed - -   PHQ 2/9 Scores 09/30/2018 09/11/2018 07/22/2018 07/09/2018 06/19/2018 04/22/2018 01/31/2018  PHQ - 2 Score 0 0 0 0 0 0 2  PHQ- 9 Score - - - - - - 4    Assessment:  Patient has a non productive cough Patient is on oxygen Patient is using a CPAP that was given to him many years ago Patient has difficulty reading and needs it written in extra large print Per patient his living conditions are poor Patient will benefit from [place] telephonic outreach for education and support for diabetes self management.  Plan:  Referred to social worker RN discussed what his A1C is RN discussed checking blood sugars and weights RN discussed eating healthy and sticking to diet RN sent patient educational material on CHF RN sent patient educational material on COPD exacerbation RN will follow up within the month of February  [place] [facility] Management [phone]

## 2018-09-30 NOTE — Addendum Note (Signed)
Addended by: Verlin Grills on: 09/30/2018 04:35 PM   Modules accepted: Orders

## 2018-10-01 ENCOUNTER — Other Ambulatory Visit: Payer: Self-pay | Admitting: *Deleted

## 2018-10-01 ENCOUNTER — Other Ambulatory Visit: Payer: Self-pay | Admitting: Physician Assistant

## 2018-10-01 DIAGNOSIS — I714 Abdominal aortic aneurysm, without rupture, unspecified: Secondary | ICD-10-CM

## 2018-10-01 DIAGNOSIS — R0600 Dyspnea, unspecified: Secondary | ICD-10-CM

## 2018-10-01 NOTE — Patient Outreach (Signed)
[name]) Care Management  10/01/2018  Lawrence Steele 09/16/1951 [id number]  Initial outreach attempt  Patient referred to this social worker to provide [employer] information for housing. Patient's home number was disconnected. Patient did not answer. Voicemail message left on his mobile phone.   Plan: Unsuccessful outreach letter mailed to patient's home. Second outreach to take place within 3-4 days.   Sheralyn Boatman [facility] Care Management [phone]

## 2018-10-01 NOTE — Patient Outreach (Signed)
[facility]) Care Management  10/01/2018  TAB RYLEE 25-Oct-1950 [id number]  RN Health Coach received  telephone call from patient.  Per patient he is having difficulty breathing. Patient stated that he had taken too many breathing  treatments today. Per patient he has taken at least  5 treatments. Patient is also a diabetic. Per patient he went to get a battery for his meter and didn't get it. He has not checked his blood sugar. Patient stated that he has not eaten today. He went to put a pizza in the oven and dropped it on the floor. RN discussed with patient if he is having difficulty breathing  he needs to go to the hospital. Also he has no idea what his blood sugar is. Patient declined RN Health Coach calling the paramedic. Patient stated that he had called his son that lives 5 miles away. Son is on his way to take patient to the hospital. Patient is sitting by the window watching for his son to come. RN stayed on phone until patient felt better and son almost there and patient stated he was ready to hang up.    [place] [facility] Management [phone]

## 2018-10-02 NOTE — Patient Outreach (Signed)
[facility]) Care Management  10/02/2018  Lawrence Steele Sep 18, 1950 [id number]   RN Health Coach telephone call to patient.  Hipaa compliance verified. RN checking to see how patient is doing. Per patient his son came last night and stayed with him for a while. He stated he did not go to the emergency room or urgent care. He took the rest of his medications and went to bed. Per patient his son is coming back today and take him to get a battery for his glucose meter. Patient stated he is feeling better. Patient stated that he has an appointment with his Dr next week. RN suggested calling Dr and moving the date up and tell them what happened last night. Patient refused. Per patient he is feeling better so he will leave it as it is.  [place] Care Management [phone]

## 2018-10-03 ENCOUNTER — Ambulatory Visit: Payer: Self-pay | Admitting: Physician Assistant

## 2018-10-07 ENCOUNTER — Other Ambulatory Visit: Payer: Self-pay | Admitting: *Deleted

## 2018-10-07 NOTE — Patient Outreach (Signed)
[facility]) Care Management  10/07/2018  Lawrence Steele June 11, 1951 [id number]   Patient referred to this social worker to provide resources for housing. Per patient , he would really like to move sating that the landlord does not take care of the property. Per patient, it takes weeks for the landlord to make minimal repairs. Patient is concerned that there may be mold in his home as well as bats in the attic, he is not sure.  Per patient, instead of reporting the landlord he would rather move.  This Education officer, museum discussed the ability to provide patient with resources related to housing, including the contact number for [employer], low income housing lists, and realty companies for his review. Per patient, his daughter is assisting him with this as well.  Patient is a recent discharge from the hospital and would like the [place] mailed to his home.   Plan: This social worker will send patient the housing Futures trader. This social worker will arrange for [employer] to be mailed to patient's home.   Sheralyn Boatman [facility] Care Management [phone]

## 2018-10-08 ENCOUNTER — Other Ambulatory Visit: Payer: Self-pay | Admitting: *Deleted

## 2018-10-08 NOTE — Progress Notes (Deleted)
Cardiology Office Note Date:  10/08/2018  Patient ID:  Lawrence, Steele [birth year], MRN [id number] PCP:  Ronnell Freshwater, NP  Cardiologist:  Dr. Rockey Situ, MD  ***refresh   Chief Complaint: Follow up  History of Present Illness: Lawrence Steele is a 68 y.o. male with history of CAD as outlined below, atrial flutter on Eliquis status post TEE/DCCV 04/2018 with repeat DCCV in 08/2018, AAA status post endovascular repair at [facility] in 2016, HFpEF, COPD with ongoing tobacco abuse on chronic oxygen at nighttime, DM2, HTN, HLD with statin intolerance, alcohol abuse, low back pain with peripheral neuropathy with previous falls, and morbid obesity with OSA and possible OHS who presents for follow-up of ***.  Past surgical history indicates the patient has previously undergone PCI to the RCA and left circumflex in 2004.  Details of this are unclear.  Patient most recently underwent cardiac cath on 01/18/2009 that showed left main 10% stenosis, mid LAD-1 lesion 20% stenosis, mid LAD-2 lesion 20% stenosis, D1 20% stenosis, mid LCx-1 lesion 30% stenosis, mid LCx-2 lesion 50% stenosis, OM1 20% stenosis, OM to 20% stenosis, LPL 20% stenosis, proximal RCA-1 lesion 20% stenosis, proximal RCA-2 lesion 20% stenosis.  EF 51% with mild inferior hypokinesis.  Echo at [facility] from 2015 showed normal LV systolic function, dilated thoracic aorta measuring 4 cm at the sinus of Valsalva and 3.6 cm above the sinotubular junction, LVH, otherwise unrevealing study.  Patient most recently underwent ischemic evaluation via nuclear stress test in 10/2016 that showed no significant ischemia with a small region of fixed perfusion defect noted in the inferior apical region on attenuation corrected images.  EF estimated at 62%.  He was noted to have frequent PACs.  This was a low risk scan.  Patient was diagnosed with atrial flutter with RVR in 03/2018.  He was started on Eliquis at that time.  He underwent TEE guided cardioversion on 04/24/2018  that was successful.  TEE showed an EF of 55 to 60%.  Most recent CT abdomen done at [facility] and 09/2015 showed stable aortoiliac endograft with decreased excluded aneurysm size.  Stable bilateral common iliac artery aneurysms, stable anterior abdominal wall hernia containing nonobstructed small bowel, anterior bladder, and penile prosthesis reservoir.  Patient was admitted on 12/18 after developing an upper respiratory infection the week prior and was seen by his pulmonologist with a heart rate in the 140s beats per minute.  He was sent to the ED where he was noted to be in atrial flutter with RVR.  He reported compliance with Eliquis.  Cardiac enzymes negative.  Labs were largely unrevealing outside of hyperglycemia and a mildly elevated WBC on day of discharge.  Chest x-ray showed no active cardiopulmonary disease with stable cardiomegaly.  The patient underwent successful cardioversion on 09/05/2018.  Patient called our office on 12/26 noting significant lower extremity swelling and weight gain.  Weight at outside office was noted to be 275 at that time.  Patient was uncertain what his weight was at home as he did not have a scale at that time.  Upon reviewing his prior office visit with [place] it appears his weight is anywhere between 250 and 260 pounds.  He was started on Lasix and advised to pick up a scale.  When patient obtain a scale on 12/27 his home weight was 284 pounds.  Following outpatient diuresis with Lasix his weight had improved to significantly to 262 pounds as of his most recent visit with cardiology on 09/19/2018.  He  continued to note some lower extremity swelling though this was significantly improved.    There was also some abdominal fullness and shortness of breath.    His Lasix was increased to 20 mg twice daily for 3 days followed by resumption of 20 mg daily.  Labs checked at that time showed a stable serum creatinine of 0.93, potassium 4.7, albumin 3.7, AST/ALT normal, WBC 9.2, Hgb 12.9.   He was scheduled for an echocardiogram which is scheduled for 10/09/2018.  ***  Past Medical History:  Diagnosis Date  . AAA (abdominal aortic aneurysm) ([place])    a.  Status post endovascular repair at [facility] in 2016  . Anxiety   . Asthma   . Atrial flutter ([place])    a. diagnosed 7/19; b. CHADS2VASc => 5 (CHF, HTN, age x 1, DM, vascular disease); c. on Eliquis; d. s/p TEE/DCCV 8/19 with repeat DCCV 12/19  . Chronic diastolic CHF (congestive heart failure) ([employer])   . COPD (chronic obstructive pulmonary disease) ([place])   . Coronary artery disease    a. LHC 5/10: left main 10%, mLAD-1 lesion 20%, mLAD-2 lesion 20%, D1 20%, mLCx-1 lesion 30%, mLCx-2 lesion 50%, OM1 20%, OM to 20%, LPL 20%, pRCA-1 lesion 20%, pRCA-2 lesion 20%.  EF 51% w/ mild inf HK  . Diabetes mellitus with complication ([place])   . Hyperlipidemia   . Hypertension   . Iliac artery aneurysm, bilateral ([place])   . Obstructive sleep apnea-hypopnea syndrome     Past Surgical History:  Procedure Laterality Date  . ABDOMINAL AORTIC ANEURYSM REPAIR  03/06/2015   [place]    . APPENDECTOMY    . CARDIAC CATHETERIZATION  2010,03/16/2003,11/04/2002  . CARDIOVERSION N/A 04/24/2018   Procedure: CARDIOVERSION;  Surgeon: Nelva Bush, MD;  Location: [place] [facility];  Service: Cardiovascular;  Laterality: N/A;  . CARDIOVERSION N/A 09/05/2018   Procedure: CARDIOVERSION;  Surgeon: Minna Merritts, MD;  Location: [facility] ORS;  Service: Cardiovascular;  Laterality: N/A;  . CORONARY ANGIOPLASTY  03/16/2003   stent to the RCA  & 2/18/2004stent mid circumflex  . ELBOW SURGERY    . HERNIA REPAIR    . NASAL SINUS SURGERY    . PENILE PROSTHESIS IMPLANT  1995  . PILONIDAL CYST EXCISION    . TEE WITHOUT CARDIOVERSION N/A 04/24/2018   Procedure: TRANSESOPHAGEAL ECHOCARDIOGRAM (TEE);  Surgeon: Nelva Bush, MD;  Location: [facility] ORS;  Service: Cardiovascular;  Laterality: N/A;  . TONSILECTOMY, ADENOIDECTOMY, BILATERAL MYRINGOTOMY AND TUBES     . UMBILICAL HERNIA REPAIR      No outpatient medications have been marked as taking for the 10/09/18 encounter (Appointment) with Rise Mu, PA-C.    Allergies:   Codeine   Social History:  The patient  reports that he has been smoking cigarettes. He has a 11.25 pack-year smoking history. He has quit using smokeless tobacco.  His smokeless tobacco use included chew. He reports previous alcohol use. He reports that he does not use drugs.   Family History:  The patient's family history includes Depression in his mother; Heart attack (age of onset: 67) in his father; Heart disease in his father; Hyperlipidemia in his father and mother; Hypertension in his father and mother.  ROS:   ROS   PHYSICAL EXAM: *** VS:  There were no vitals taken for this visit. BMI: There is no height or weight on file to calculate BMI.  Physical Exam   EKG:  Was ordered and interpreted by me today. Shows ***  Recent Labs: 09/19/2018: ALT 19; BUN 9; Creatinine, Ser 0.93; Hemoglobin 12.9; Platelets 179; Potassium 4.7; Sodium 139  No results found for requested labs within last 8760 hours.   CrCl cannot be calculated (Unknown ideal weight.).   Wt Readings from Last 3 Encounters:  09/19/18 265 lb 12 oz (120.5 kg)  09/11/18 275 lb (124.7 kg)  09/05/18 257 lb 14.4 oz (117 kg)     Other studies reviewed: Additional studies/records reviewed today include: summarized above  ASSESSMENT AND PLAN:  1. ***  Disposition: F/u with Dr. Rockey Situ or an APP in ***  Current medicines are reviewed at length with the patient today.  The patient did not have any concerns regarding medicines.  Signed, Christell Faith, PA-C 10/08/2018 9:45 AM     [place], [place] [address] [phone]

## 2018-10-08 NOTE — Patient Outreach (Signed)
[facility]) Care Management  10/08/2018  Lawrence Steele August 20, 1951 [id number]   Phone call to the [facility] to order patient's meals, however meals were not able to be ordered as patient has been discharged over 30 days.    This Education officer, museum, however was informed that he receives Meals on [employer].    Sheralyn Boatman [facility] Care Management [phone]

## 2018-10-09 ENCOUNTER — Ambulatory Visit: Payer: Self-pay | Admitting: Physician Assistant

## 2018-10-09 ENCOUNTER — Other Ambulatory Visit: Payer: Self-pay

## 2018-10-09 ENCOUNTER — Ambulatory Visit (INDEPENDENT_AMBULATORY_CARE_PROVIDER_SITE_OTHER): Payer: Medicare HMO

## 2018-10-09 DIAGNOSIS — I714 Abdominal aortic aneurysm, without rupture, unspecified: Secondary | ICD-10-CM

## 2018-10-09 DIAGNOSIS — R0600 Dyspnea, unspecified: Secondary | ICD-10-CM | POA: Diagnosis not present

## 2018-10-10 ENCOUNTER — Encounter: Payer: Self-pay | Admitting: *Deleted

## 2018-10-10 ENCOUNTER — Other Ambulatory Visit: Payer: Self-pay | Admitting: *Deleted

## 2018-10-10 ENCOUNTER — Encounter: Payer: Self-pay | Admitting: Physician Assistant

## 2018-10-10 NOTE — Patient Outreach (Signed)
[facility]) Care Management  10/10/2018  Lawrence Steele Mar 04, 1951 [id number]   Housing resources mailed to patient's home.   This Education officer, museum will follow up within 2 weeks to ensure patient has received the resources mailed to him.    Sheralyn Boatman [facility] Care Management [phone]

## 2018-10-13 ENCOUNTER — Telehealth: Payer: Self-pay

## 2018-10-13 NOTE — Telephone Encounter (Signed)
Attempted to call patient. LMTCB 1/27 

## 2018-10-13 NOTE — Telephone Encounter (Signed)
-----   Message from Theora Gianotti, NP sent at 10/11/2018  6:44 AM EST ----- Nl heart squeezing fxn.  Heart is moderately stiff, so we need to make sure that we are doing our best to manage HR/BP (both fine @ office visit).

## 2018-10-14 NOTE — Telephone Encounter (Signed)
Reviewed results and recommendations with patient and he verbalized understanding with no further questions at this time.  

## 2018-10-15 ENCOUNTER — Telehealth: Payer: Self-pay

## 2018-10-15 NOTE — Telephone Encounter (Signed)
Faxed patient order RX for new cpap to patient choice in DME company [place]

## 2018-10-24 ENCOUNTER — Other Ambulatory Visit: Payer: Self-pay | Admitting: *Deleted

## 2018-10-24 NOTE — Patient Outreach (Signed)
[facility]) Care Management  10/24/2018  Lawrence Steele 06-Sep-1951 [id number]   Phone call to patient to confirm that he received the housing resources mailed to him. Per patient, he did receive the resources and will have his daughter assist him with making some calls next week. Per patient, he also received his [employer], possibly from a previous referral.  Per patient, he has no additional community resource needs at this time.   This Education officer, museum will sin off at this time.Patient to call if there are any concerns ro questions in the future.   Sheralyn Boatman [facility] Care Management [phone]

## 2018-10-27 ENCOUNTER — Ambulatory Visit: Payer: Self-pay | Admitting: *Deleted

## 2018-10-31 ENCOUNTER — Other Ambulatory Visit: Payer: Self-pay | Admitting: *Deleted

## 2018-10-31 NOTE — Patient Outreach (Signed)
[facility]) Care Management  10/31/2018   Lawrence Steele 1951/09/02 [id number]  RN Health Coach telephone call to patient.  [employer] compliance verified. Per patient he is doing fair. Patient stated that his fasting blood sugar is 210. Per patient he had eaten some ice cream last night. Patient has a chronic cough that is productive in the morning and after nebulizer treatments of thick yellow sputum.He stated has not gotten any better since coming out of the hospital. Patient has not made his Dr aware. RN made patient aware that he needs to do this. Patient said he has an appointment next week and will go then.    Current Medications:  Current Outpatient Medications  Medication Sig Dispense Refill  . ACCU-CHEK SOFTCLIX LANCETS lancets Use as instructed  Once a daily E11.65 100 each 1  . acetaminophen (TYLENOL) 500 MG tablet Take 500 mg by mouth every 6 (six) hours as needed.     Marland Kitchen albuterol (PROAIR HFA) 108 (90 Base) MCG/ACT inhaler Inhale 2 puffs into the lungs every 6 (six) hours as needed. 3 Inhaler 3  . allopurinol (ZYLOPRIM) 100 MG tablet Take 1 tablet (100 mg total) by mouth 2 (two) times daily. 180 tablet 1  . apixaban (ELIQUIS) 5 MG TABS tablet Take 1 tablet (5 mg total) by mouth 2 (two) times daily. 60 tablet 3  . atenolol (TENORMIN) 50 MG tablet Take 1 tablet (50 mg total) by mouth daily. 90 tablet 3  . atorvastatin (LIPITOR) 20 MG tablet Take 1 tablet (20 mg total) by mouth daily. 30 tablet 11  . bisacodyl (DULCOLAX) 5 MG EC tablet Take 1 tablet (5 mg total) by mouth daily as needed for moderate constipation. 30 tablet 0  . Blood Glucose Monitoring Suppl (ACCU-CHEK AVIVA PLUS) w/Device KIT Use once daily to check blood sugar. 1 kit 3  . ciprofloxacin (CIPRO) 500 MG tablet Take 1 tablet (500 mg total) by mouth 2 (two) times daily. 20 tablet 0  . cyclobenzaprine (FLEXERIL) 10 MG tablet Take 1 tablet (10 mg total) by mouth 2 (two) times daily as needed for muscle  spasms. 60 tablet 2  . diclofenac sodium (VOLTAREN) 1 % GEL Apply 2 g topically 4 (four) times daily.     Marland Kitchen dicyclomine (BENTYL) 10 MG capsule Take 1 capsule (10 mg total) by mouth 4 (four) times daily -  before meals and at bedtime. 45 capsule 3  . doxycycline (VIBRA-TABS) 100 MG tablet Take 1 tablet (100 mg total) by mouth daily. For acne. 30 tablet 4  . escitalopram (LEXAPRO) 5 MG tablet Take 1 tablet (5 mg total) by mouth daily. 30 tablet 3  . Fluticasone-Salmeterol (ADVAIR DISKUS) 250-50 MCG/DOSE AEPB Inhale 1 puff into the lungs 2 (two) times daily. Frequency:BID   Dosage:0.0     Instructions:  Note:Dose: 250-50MCG 3 each 3  . furosemide (LASIX) 20 MG tablet Take 1 tablet (20 mg total) by mouth daily. 90 tablet 3  . gabapentin (NEURONTIN) 400 MG capsule Take 400 mg by mouth 3 (three) times daily.     Marland Kitchen glimepiride (AMARYL) 1 MG tablet Take 2 tablets (2 mg total) by mouth daily with supper. (Patient taking differently: Take 2 mg by mouth 2 (two) times daily with a meal. ) 180 tablet 1  . Glucose Blood (ACCU-CHEK AVIVA PLUS VI) by In Vitro route. Pt needs accu check Aviva plus lancets, test strips and meter to check blood sugars once daily    . glucose blood (ACCU-CHEK  AVIVA PLUS) test strip 1 each by Other route as needed for other. Use as instructed 100 each 3  . HYDROcodone-acetaminophen (NORCO) 5-325 MG tablet Take 1 tablet by mouth 2 (two) times daily.     . hydroxypropyl methylcellulose / hypromellose (ISOPTO TEARS / GONIOVISC) 2.5 % ophthalmic solution Place 1 drop into both eyes daily as needed for dry eyes.    Marland Kitchen ipratropium (ATROVENT) 0.02 % nebulizer solution Use every 6 hours for Shortness of breath and wheezing and as needed.  Prescription is for 90 days 900 mL 1  . isosorbide mononitrate (IMDUR) 60 MG 24 hr tablet Take 1 tablet (60 mg total) by mouth daily. 90 tablet 1  . loratadine (CLARITIN) 10 MG tablet Take 10 mg by mouth daily.     . meloxicam (MOBIC) 7.5 MG tablet Take 1 tablet  (7.5 mg total) by mouth 2 (two) times daily. 60 tablet 3  . metFORMIN (GLUCOPHAGE) 500 MG tablet Take 1 tablet (500 mg total) by mouth 2 (two) times daily with a meal. 180 tablet 1  . nitroGLYCERIN (NITROSTAT) 0.4 MG SL tablet Place 0.4 mg under the tongue.    . nystatin (MYCOSTATIN) 100000 UNIT/ML suspension Take 5 mLs (500,000 Units total) by mouth 4 (four) times daily. 200 mL 2  . oxyCODONE-acetaminophen (PERCOCET) 7.5-325 MG tablet Take 1 tablet by mouth every 8 (eight) hours as needed for severe pain. 30 tablet 0  . OXYGEN Inhale 2 L into the lungs continuous.     . predniSONE (STERAPRED UNI-PAK 21 TAB) 10 MG (21) TBPK tablet Take 1 tablet (10 mg total) by mouth daily. Take 6 tablets by mouth for 1 day followed by  5 tablets by mouth for 1 day followed by  4 tablets by mouth for 1 day followed by  3 tablets by mouth for 1 day followed by  2 tablets by mouth for 1 day followed by  1 tablet by mouth for a day and stop 21 tablet 0  . traZODone (DESYREL) 100 MG tablet Take 1 tablet (100 mg total) by mouth at bedtime. 30 tablet 3  . umeclidinium bromide (INCRUSE ELLIPTA) 62.5 MCG/INH AEPB Inhale 1 puff into the lungs daily. 3 each 3   No current facility-administered medications for this visit.     Functional Status:  In your present state of health, do you have any difficulty performing the following activities: 10/31/2018 09/03/2018  Hearing? N -  Vision? N -  Difficulty concentrating or making decisions? N -  Walking or climbing stairs? Y -  Comment patient has COPD and shortness of breathon min exertion -  Dressing or bathing? N -  Doing errands, shopping? Y N  Comment patient does not drive and daughter and son has to take him everywhere -  Conservation officer, nature and eating ? N -  Using the Toilet? N -  In the past six months, have you accidently leaked urine? N -  Do you have problems with loss of bowel control? N -  Managing your Medications? N -  Managing your Finances? Y -  Comment  son and daughter help patient -  Housekeeping or managing your Housekeeping? Y -  Comment daughter helps patient -  Some recent data might be hidden    Fall/Depression Screening: Fall Risk  10/31/2018 09/30/2018 09/11/2018  Falls in the past year? _0 Comment Per patient his balance is off and he has fallen - -  Number falls in past yr: 0 0 0  Injury with Fall? 1 1 1  Comment bruised hi sarm - bruising on the arm  Risk for fall due to : History of fall(s);Impaired balance/gait;Impaired mobility History of fall(s);Impaired balance/gait;Impaired mobility -  Follow up - Falls evaluation completed -   PHQ 2/9 Scores 10/31/2018 09/30/2018 09/11/2018 07/22/2018 07/09/2018 06/19/2018 04/22/2018  PHQ - 2 Score 0 0 0 0 0 0 0  PHQ- 9 Score - - - - - - -   THN CM Care Plan Problem One     Most Recent Value  Care Plan Problem One  Knowledge Deficit in Self Management of Diabetes  Role Documenting the Problem One  Health Coach  Care Plan for Problem One  Active  THN Long Term Goal   Patient will  see a decrease in his A1C from 7.2 within the next 90 days within the next 90 days  Interventions for Problem One Long Term Goal  RN reiterated to patient that he needs to try to stick with diet. RN discussed substituting some fioods for others to decrease the sugar and carb content. RN will follow up for compliance  THN CM Short Term Goal #1 Met Date  10/31/18  THN CM Short Term Goal #4  Patient will have a better understanding of serving size and portion control within the next 30 days  Interventions for Short Term Goal #4  Patient is trying to adjust sizes of meal but because he doesn't cook eats all when it is brought to him. RN discusses eat 1/2 save the rest for next meal       Assessment:  Fasting blood sugar 210 Patient has meals on wheels and eat what they bring and he can afford Patient will continue to benefit from Health Coach telephonic outreach for education and support for diabetes self  management.  Plan: RN discussed adhering to diet RN discussed substituting yogurt or sherbet in place of ice cream Patient will see PCP for respiratory issues Patient is going to contact a realtor to help him get a rental house RN will follow u pwithin the month of April  Frances Pleasant BSN RN [facility] Care Management [phone]          

## 2018-11-04 ENCOUNTER — Other Ambulatory Visit: Payer: Self-pay | Admitting: Nurse Practitioner

## 2018-11-04 DIAGNOSIS — F411 Generalized anxiety disorder: Secondary | ICD-10-CM

## 2018-11-04 MED ORDER — TRAZODONE HCL 100 MG PO TABS: 100.0000 mg | ORAL_TABLET | Freq: Every day | ORAL | 3 refills | Status: DC

## 2018-11-04 NOTE — Progress Notes (Signed)
Renewed prescription for trazodone 100mg  at bedtime as needed for insomnia per pharmacy request. Next appointment 11/24/2018

## 2018-11-05 ENCOUNTER — Ambulatory Visit: Payer: Self-pay | Admitting: Adult Health

## 2018-11-17 ENCOUNTER — Telehealth: Payer: Self-pay | Admitting: Cardiovascular Disease

## 2018-11-17 NOTE — Telephone Encounter (Signed)
Spoke with patient.  He reports his SOB is not any worse than his usual (he's on O2 24 hours a day). Mainly, he's noticed his HR elevated. Starting last week, he saw his HR increased to 135. Then it went down and has been lingering around 120 at times. He denies chest pain, fluttering or dizziness. He also asked if he went to the ED if they could "shock" his heart. Advised that if symptoms got worse and he was evaluated and ED provider felt appropriate that could be done. Patient scheduled to see Thurmond Butts on 3/31 for now. Routing to [place] for advice as he saw him first of January.

## 2018-11-17 NOTE — Telephone Encounter (Signed)
Patient c/o Palpitations:  High priority if patient c/o lightheadedness, shortness of breath, or chest pain  1) How long have you had palpitations/irregular HR/ Afib? Are you having the symptoms now? Since last year  .  Denies current symptoms but states he has frequent sob   2) Are you currently experiencing lightheadedness, SOB or CP? SOB always lightheaded   3) Do you have a history of afib (atrial fibrillation) or irregular heart rhythm? yes  4) Have you checked your BP or HR? (document readings if available): HR trending 50's-130's per digital reader  5) Are you experiencing any other symptoms?  No   Patient wants to know if they have the ability to cardiovert in ER if this gets worse   Patient sheduled 3/31 with Thurmond Butts

## 2018-11-18 NOTE — Telephone Encounter (Signed)
Patient calling back and said he will have transportation for 11/21/18 at 10 am. He was appreciative.

## 2018-11-18 NOTE — Telephone Encounter (Signed)
Have patient come in for EKG. If needed, perhaps we could expedite a DCCV, rather than having him wait until the end of March.

## 2018-11-18 NOTE — Telephone Encounter (Signed)
Called patient. He is agreeable to coming in for EKG. He has issues with transportation. He uses [employer] or [employer] and has to give them a notice. He has doctor's appointments already scheduled for tomorrow and Thursday. He is available to come in Friday morning around 10 am. He needs to call transportation to make sure he can get a ride. He will call me back to let me know for sure this will work for him.

## 2018-11-20 ENCOUNTER — Ambulatory Visit: Payer: Self-pay | Admitting: Internal Medicine

## 2018-11-21 ENCOUNTER — Ambulatory Visit (INDEPENDENT_AMBULATORY_CARE_PROVIDER_SITE_OTHER): Payer: Medicare HMO | Admitting: Physician Assistant

## 2018-11-21 ENCOUNTER — Other Ambulatory Visit: Payer: Self-pay | Admitting: Physician Assistant

## 2018-11-21 VITALS — BP 108/60 | HR 126 | Ht 70.0 in | Wt 263.5 lb

## 2018-11-21 DIAGNOSIS — I483 Typical atrial flutter: Secondary | ICD-10-CM | POA: Diagnosis not present

## 2018-11-21 DIAGNOSIS — Z01812 Encounter for preprocedural laboratory examination: Secondary | ICD-10-CM

## 2018-11-21 MED ORDER — DIGOXIN 125 MCG PO TABS: 0.1250 mg | ORAL_TABLET | Freq: Every day | ORAL | 1 refills | Status: DC

## 2018-11-21 NOTE — Patient Instructions (Addendum)
Medication Instructions:  Your physician has recommended you make the following change in your medication:   1) START digoxin 0.125 mg- take 1 tablet by mouth once daily   2) TAKE 1 extra dose of lasix (furosemide) 20 mg today  If you need a refill on your cardiac medications before your next appointment, please call your pharmacy.   Lab work: - Your physician recommends that you have lab work today: BMP/ CBC/ TSH  If you have labs (blood work) drawn today and your tests are completely normal, you will receive your results only by: Marland Kitchen MyChart Message (if you have MyChart) OR . A paper copy in the mail If you have any lab test that is abnormal or we need to change your treatment, we will call you to review the results.  Testing/Procedures: - Your physician has recommended that you have a Cardioversion (DCCV). Electrical Cardioversion uses a jolt of electricity to your heart either through paddles or wired patches attached to your chest. This is a controlled, usually prescheduled, procedure. Defibrillation is done under light anesthesia in the hospital, and you usually go home the day of the procedure. This is done to get your heart back into a normal rhythm. You are not awake for the procedure. Please see the instruction sheet given to you today.  You are scheduled for a Cardioversion on Monday 11/24/18 with Dr. Fletcher Anon.  Please arrive at the [place] [facility] at 7:00 a.m. on the day of your procedure- 1st desk on the right to register.  DIET INSTRUCTIONS:  Nothing to eat or drink after midnight the night prior         1) Labs: today- BMP/ CBC  2) Medications:  You may take all of your regular medications with enough water to get them down safely the morning of your procedure unless listed below:  - HOLD lasix (furosemide) the morning of your procedure - HOLD amaryl (glimeperide) the morning of your procedure - HOLD glucophage (metformin) the morning of your procedure  3) Must have  a responsible person to drive you home.  4) Bring a current list of your medications and current insurance cards.    If you have any questions after you get home, please call the office at [phone]  Follow-Up: At [facility], you and your health needs are our priority.  As part of our continuing mission to provide you with exceptional heart care, we have created designated Provider Care Teams.  These Care Teams include your primary Cardiologist (physician) and Advanced Practice Providers (APPs -  Physician Assistants and Nurse Practitioners) who all work together to provide you with the care you need, when you need it. . in 1-2 weeks (from 11/24/18) with Dr. Louisa Second, [place]   . You have been referred to : Dr. [name] (Electrophysiology) for consideration of atrial flutter ablation  Any Other Special Instructions Will Be Listed Below (If Applicable). - N/A   Electrical Cardioversion  Electrical cardioversion is the delivery of a jolt of electricity to restore a normal rhythm to the heart. A rhythm that is too fast or is not regular keeps the heart from pumping well. In this procedure, sticky patches or metal paddles are placed on the chest to deliver electricity to the heart from a device. This procedure may be done in an emergency if:  There is low or no blood pressure as a result of the heart rhythm.  Normal rhythm must be restored as fast as possible to  protect the brain and heart from further damage.  It may save a life. This procedure may also be done for irregular or fast heart rhythms that are not immediately life-threatening. Tell a health care provider about:  Any allergies you have.  All medicines you are taking, including vitamins, herbs, eye drops, creams, and over-the-counter medicines.  Any problems you or family members have had with anesthetic medicines.  Any blood disorders you have.  Any surgeries you have had.  Any medical conditions you  have.  Whether you are pregnant or may be pregnant. What are the risks? Generally, this is a safe procedure. However, problems may occur, including:  Allergic reactions to medicines.  A blood clot that breaks free and travels to other parts of your body.  The possible return of an abnormal heart rhythm within hours or days after the procedure.  Your heart stopping (cardiac arrest). This is rare. What happens before the procedure? Medicines  Your health care provider may have you start taking: ? Blood-thinning medicines (anticoagulants) so your blood does not clot as easily. ? Medicines may be given to help stabilize your heart rate and rhythm.  Ask your health care provider about changing or stopping your regular medicines. This is especially important if you are taking diabetes medicines or blood thinners. General instructions  Plan to have someone take you home from the hospital or clinic.  If you will be going home right after the procedure, plan to have someone with you for 24 hours.  Follow instructions from your health care provider about eating or drinking restrictions. What happens during the procedure?  To lower your risk of infection: ? Your health care team will wash or sanitize their hands. ? Your skin will be washed with soap.  An IV tube will be inserted into one of your veins.  You will be given a medicine to help you relax (sedative).  Sticky patches (electrodes) or metal paddles may be placed on your chest.  An electrical shock will be delivered. The procedure may vary among health care providers and hospitals. What happens after the procedure?   Your blood pressure, heart rate, breathing rate, and blood oxygen level will be monitored until the medicines you were given have worn off.  Do not drive for 24 hours if you were given a sedative.  Your heart rhythm will be watched to make sure it does not change. This information is not intended to replace  advice given to you by your health care provider. Make sure you discuss any questions you have with your health care provider. Document Released: 08/24/2002 Document Revised: 05/02/2016 Document Reviewed: 03/09/2016 Elsevier Interactive Patient Education  2019 [employer].

## 2018-11-21 NOTE — Progress Notes (Signed)
    Patient presented for RN visit today secondary to tachypalpitations.  Twelve-lead EKG showed atrial flutter with RVR with ventricular rate 126 bpm.  BP 108/60.  Weight 262 pounds.  Patient continues to note intermittent tachypalpitations with mild lower extremity swelling.  No chest pain, shortness of breath, dizziness, presyncope, or syncope.  No orthopnea, PND, or early satiety.  He has been compliant with all medications including anticoagulation and denies missing any doses.  Case was discussed with Dr. Rockey Situ.  We have recommended the patient be scheduled for cardioversion on Monday, 11/24/2018 for recurrent atrial flutter with RVR.  We have placed the patient on low-dose digoxin 0.125 mg daily.  He has also been advised to take 1 extra Lasix 20 mg with KCl this afternoon.  He has been referred to EP for consideration of atrial flutter ablation.

## 2018-11-21 NOTE — H&P (View-Only) (Signed)
    Patient presented for RN visit today secondary to tachypalpitations.  Twelve-lead EKG showed atrial flutter with RVR with ventricular rate 126 bpm.  BP 108/60.  Weight 262 pounds.  Patient continues to note intermittent tachypalpitations with mild lower extremity swelling.  No chest pain, shortness of breath, dizziness, presyncope, or syncope.  No orthopnea, PND, or early satiety.  He has been compliant with all medications including anticoagulation and denies missing any doses.  Case was discussed with Dr. Rockey Situ.  We have recommended the patient be scheduled for cardioversion on Monday, 11/24/2018 for recurrent atrial flutter with RVR.  We have placed the patient on low-dose digoxin 0.125 mg daily.  He has also been advised to take 1 extra Lasix 20 mg with KCl this afternoon.  He has been referred to EP for consideration of atrial flutter ablation.

## 2018-11-22 LAB — BASIC METABOLIC PANEL
BUN/Creatinine Ratio: 12 (ref 10–24)
BUN: 10 mg/dL (ref 8–27)
CO2: 21 mmol/L (ref 20–29)
Calcium: 8.9 mg/dL (ref 8.6–10.2)
Chloride: 100 mmol/L (ref 96–106)
Creatinine, Ser: 0.85 mg/dL (ref 0.76–1.27)
GFR calc Af Amer: 104 mL/min/{1.73_m2} (ref >59)
GFR calc non Af Amer: 90 mL/min/{1.73_m2} (ref >59)
Glucose: 151 mg/dL — ABNORMAL HIGH (ref 65–99)
Potassium: 4.2 mmol/L (ref 3.5–5.2)
Sodium: 140 mmol/L (ref 134–144)

## 2018-11-22 LAB — CBC WITH DIFFERENTIAL/PLATELET
Basophils Absolute: 0.1 10*3/uL (ref 0.0–0.2)
Basos: 1 %
EOS (ABSOLUTE): 0.2 10*3/uL (ref 0.0–0.4)
Eos: 2 %
Hematocrit: 41.4 % (ref 37.5–51.0)
Hemoglobin: 14.3 g/dL (ref 13.0–17.7)
IMMATURE GRANULOCYTES: 0 %
Immature Grans (Abs): 0 10*3/uL (ref 0.0–0.1)
Lymphocytes Absolute: 3.3 10*3/uL — ABNORMAL HIGH (ref 0.7–3.1)
Lymphs: 30 %
MCH: 34.3 pg — ABNORMAL HIGH (ref 26.6–33.0)
MCHC: 34.5 g/dL (ref 31.5–35.7)
MCV: 99 fL — ABNORMAL HIGH (ref 79–97)
Monocytes Absolute: 0.7 10*3/uL (ref 0.1–0.9)
Monocytes: 6 %
Neutrophils Absolute: 6.8 10*3/uL (ref 1.4–7.0)
Neutrophils: 61 %
Platelets: 161 10*3/uL (ref 150–450)
RBC: 4.17 x10E6/uL (ref 4.14–5.80)
RDW: 15.2 % (ref 11.6–15.4)
WBC: 11.1 10*3/uL — ABNORMAL HIGH (ref 3.4–10.8)

## 2018-11-22 LAB — TSH: TSH: 0.924 u[IU]/mL (ref 0.450–4.500)

## 2018-11-23 ENCOUNTER — Encounter: Payer: Self-pay | Admitting: Anesthesiology

## 2018-11-24 ENCOUNTER — Encounter: Payer: Self-pay | Admitting: Cardiovascular Disease

## 2018-11-24 ENCOUNTER — Other Ambulatory Visit: Payer: Self-pay

## 2018-11-24 ENCOUNTER — Ambulatory Visit: Payer: Self-pay | Admitting: Nurse Practitioner

## 2018-11-24 ENCOUNTER — Encounter
Admission: RE | Disposition: A | Discharge status: Home / Self Care | Payer: Self-pay | Source: Home / Self Care | Attending: Cardiovascular Disease

## 2018-11-24 ENCOUNTER — Telehealth: Payer: Self-pay

## 2018-11-24 ENCOUNTER — Ambulatory Visit: Payer: Medicare HMO | Admitting: Anesthesiology

## 2018-11-24 ENCOUNTER — Ambulatory Visit
Admission: RE | Admit: 2018-11-24 | Discharge: 2018-11-24 | Disposition: A | Discharge status: Home / Self Care | Payer: Medicare HMO | Attending: Cardiovascular Disease | Admitting: Cardiovascular Disease

## 2018-11-24 DIAGNOSIS — G4730 Sleep apnea, unspecified: Secondary | ICD-10-CM | POA: Insufficient documentation

## 2018-11-24 DIAGNOSIS — M1990 Unspecified osteoarthritis, unspecified site: Secondary | ICD-10-CM | POA: Insufficient documentation

## 2018-11-24 DIAGNOSIS — I509 Heart failure, unspecified: Secondary | ICD-10-CM | POA: Diagnosis not present

## 2018-11-24 DIAGNOSIS — I110 Hypertensive heart disease with heart failure: Secondary | ICD-10-CM | POA: Insufficient documentation

## 2018-11-24 DIAGNOSIS — Z9981 Dependence on supplemental oxygen: Secondary | ICD-10-CM | POA: Insufficient documentation

## 2018-11-24 DIAGNOSIS — Z6837 Body mass index (BMI) 37.0-37.9, adult: Secondary | ICD-10-CM | POA: Insufficient documentation

## 2018-11-24 DIAGNOSIS — J449 Chronic obstructive pulmonary disease, unspecified: Secondary | ICD-10-CM | POA: Diagnosis not present

## 2018-11-24 DIAGNOSIS — I4892 Unspecified atrial flutter: Secondary | ICD-10-CM | POA: Diagnosis present

## 2018-11-24 DIAGNOSIS — M109 Gout, unspecified: Secondary | ICD-10-CM | POA: Diagnosis not present

## 2018-11-24 DIAGNOSIS — I25119 Atherosclerotic heart disease of native coronary artery with unspecified angina pectoris: Secondary | ICD-10-CM | POA: Insufficient documentation

## 2018-11-24 DIAGNOSIS — F17200 Nicotine dependence, unspecified, uncomplicated: Secondary | ICD-10-CM | POA: Diagnosis not present

## 2018-11-24 DIAGNOSIS — I739 Peripheral vascular disease, unspecified: Secondary | ICD-10-CM | POA: Insufficient documentation

## 2018-11-24 DIAGNOSIS — D649 Anemia, unspecified: Secondary | ICD-10-CM | POA: Insufficient documentation

## 2018-11-24 DIAGNOSIS — E119 Type 2 diabetes mellitus without complications: Secondary | ICD-10-CM | POA: Insufficient documentation

## 2018-11-24 DIAGNOSIS — R002 Palpitations: Secondary | ICD-10-CM | POA: Insufficient documentation

## 2018-11-24 HISTORY — PX: CARDIOVERSION: EP1203

## 2018-11-24 LAB — GLUCOSE, CAPILLARY: Glucose-Capillary: 199 mg/dL — ABNORMAL HIGH (ref 70–99)

## 2018-11-24 SURGERY — CARDIOVERSION (CATH LAB)
Anesthesia: General

## 2018-11-24 MED ORDER — ONDANSETRON HCL 4 MG/2ML IJ SOLN: 4.0000 mg | Freq: Once | INTRAMUSCULAR | Status: DC | PRN

## 2018-11-24 MED ORDER — PROPOFOL 10 MG/ML IV BOLUS
INTRAVENOUS | Status: DC | PRN
  Administered 2018-11-24: 70 mg via INTRAVENOUS

## 2018-11-24 MED ORDER — FENTANYL CITRATE (PF) 100 MCG/2ML IJ SOLN: 25.0000 ug | INTRAMUSCULAR | Status: DC | PRN

## 2018-11-24 MED ORDER — LACTATED RINGERS IV SOLN
INTRAVENOUS | Status: DC | PRN
  Administered 2018-11-24: 07:00:00 via INTRAVENOUS

## 2018-11-24 NOTE — CV Procedure (Signed)
Cardioversion note: A standard informed consent was obtained. Timeout was performed. The pads were placed in the anterior posterior fashion. The patient was given propofol by the anesthesia team.  Successful cardioversion was performed with a 200 J. The patient converted to sinus rhythm. Pre-and post EKGs were reviewed. The patient tolerated the procedure with no immediate complications.  Recommendations: Continue same medications and follow-up in 2-3 weeks.  

## 2018-11-24 NOTE — Interval H&P Note (Signed)
History and Physical Interval Note:  11/24/2018 8:10 AM  Lawrence Steele  has presented today for surgery, with the diagnosis of Cardioversion   Afib.  The various methods of treatment have been discussed with the patient and family. After consideration of risks, benefits and other options for treatment, the patient has consented to  Procedure(s): CARDIOVERSION (CATH LAB) (N/A) as a surgical intervention.  The patient's history has been reviewed, patient examined, no change in status, stable for surgery.  I have reviewed the patient's chart and labs.  Questions were answered to the patient's satisfaction.     [name]

## 2018-11-24 NOTE — Telephone Encounter (Signed)
-----   Message from Rise Mu, PA-C sent at 11/22/2018 10:21 AM EST ----- Random glucose is elevated with known diabetes.  Renal function is normal.  Potassium is at goal.  WBC is mildly elevated, without obvious infection while in the office.  Blood count is normal.  Thyroid function is normal.  Pre-DCCV labs ok.

## 2018-11-24 NOTE — Anesthesia Postprocedure Evaluation (Signed)
Anesthesia Post Note  Patient: Lawrence Steele  Procedure(s) Performed: CARDIOVERSION (CATH LAB) (N/A )  Patient location during evaluation: Specials Recovery Anesthesia Type: General Level of consciousness: awake and alert Pain management: pain level controlled Vital Signs Assessment: post-procedure vital signs reviewed and stable Respiratory status: spontaneous breathing, nonlabored ventilation, respiratory function stable and patient connected to nasal cannula oxygen Cardiovascular status: blood pressure returned to baseline and stable Postop Assessment: no apparent nausea or vomiting Anesthetic complications: no     Last Vitals:  Vitals:   11/24/18 0815 11/24/18 0830  BP: (!) 95/57 108/72  Pulse: 85 87  Resp: 15 14  Temp:    SpO2: 98% 97%    Last Pain:  Vitals:   11/24/18 0830  TempSrc:   PainSc: 0-No pain                 Janaisa Birkland S

## 2018-11-24 NOTE — Anesthesia Procedure Notes (Signed)
Performed by: Stephinie Battisti, CRNA Pre-anesthesia Checklist: Patient identified, Emergency Drugs available, Suction available, Patient being monitored and Timeout performed Patient Re-evaluated:Patient Re-evaluated prior to induction Oxygen Delivery Method: Nasal cannula Induction Type: IV induction       

## 2018-11-24 NOTE — Anesthesia Post-op Follow-up Note (Signed)
Anesthesia QCDR form completed.        

## 2018-11-24 NOTE — Telephone Encounter (Signed)
Call to patient to review labs. He reported that he had cardioversion this morning. We confirmed follow up with Christell Faith, PA on the 26th of March.   No further questions at this time. Advised pt to call for any further questions or concerns.

## 2018-11-24 NOTE — Anesthesia Preprocedure Evaluation (Addendum)
Anesthesia Evaluation  Patient identified by MRN, date of birth, ID band Patient awake    Reviewed: Allergy & Precautions, NPO status , Patient's Chart, lab work & pertinent test results, reviewed documented beta blocker date and time   Airway Mallampati: III  TM Distance: >3 FB     Dental  (+) Chipped   Pulmonary asthma , sleep apnea and Continuous Positive Airway Pressure Ventilation , COPD,  oxygen dependent, Current Smoker,           Cardiovascular hypertension, Pt. on medications and Pt. on home beta blockers + angina + CAD, + Past MI, + Cardiac Stents, + Peripheral Vascular Disease and +CHF       Neuro/Psych    GI/Hepatic   Endo/Other  diabetes, Type 2Morbid obesity  Renal/GU      Musculoskeletal  (+) Arthritis ,   Abdominal   Peds  Hematology  (+) anemia ,   Anesthesia Other Findings Smokes. Gout.  EF 60-65.  Reproductive/Obstetrics                            Anesthesia Physical Anesthesia Plan  ASA: III  Anesthesia Plan: General   Post-op Pain Management:    Induction: Intravenous  PONV Risk Score and Plan:   Airway Management Planned:   Additional Equipment:   Intra-op Plan:   Post-operative Plan:   Informed Consent: I have reviewed the patients History and Physical, chart, labs and discussed the procedure including the risks, benefits and alternatives for the proposed anesthesia with the patient or authorized representative who has indicated his/her understanding and acceptance.       Plan Discussed with: CRNA  Anesthesia Plan Comments:         Anesthesia Quick Evaluation

## 2018-11-24 NOTE — Transfer of Care (Signed)
Immediate Anesthesia Transfer of Care Note  Patient: Lawrence Steele  Procedure(s) Performed: CARDIOVERSION (CATH LAB) (N/A )  Patient Location: PACU  Anesthesia Type:General  Level of Consciousness: sedated  Airway & Oxygen Therapy: Patient Spontanous Breathing and Patient connected to nasal cannula oxygen  Post-op Assessment: Report given to RN and Post -op Vital signs reviewed and stable  Post vital signs: Reviewed and stable  Last Vitals:  Vitals Value Taken Time  BP 98/60 11/24/2018  7:54 AM  Temp    Pulse 87 11/24/2018  7:56 AM  Resp 13 11/24/2018  7:56 AM  SpO2 95 % 11/24/2018  7:56 AM    Last Pain:  Vitals:   11/24/18 0723  TempSrc: Oral         Complications: No apparent anesthesia complications

## 2018-11-26 ENCOUNTER — Ambulatory Visit: Payer: Medicare HMO | Admitting: Adult Health

## 2018-11-26 ENCOUNTER — Encounter: Payer: Self-pay | Admitting: Adult Health

## 2018-11-26 VITALS — BP 112/66 | HR 91 | Resp 16 | Ht 70.0 in | Wt 272.0 lb

## 2018-11-26 DIAGNOSIS — F17210 Nicotine dependence, cigarettes, uncomplicated: Secondary | ICD-10-CM | POA: Diagnosis not present

## 2018-11-26 DIAGNOSIS — I10 Essential (primary) hypertension: Secondary | ICD-10-CM

## 2018-11-26 DIAGNOSIS — R0602 Shortness of breath: Secondary | ICD-10-CM

## 2018-11-26 DIAGNOSIS — I4891 Unspecified atrial fibrillation: Secondary | ICD-10-CM | POA: Diagnosis not present

## 2018-11-26 DIAGNOSIS — F17200 Nicotine dependence, unspecified, uncomplicated: Secondary | ICD-10-CM

## 2018-11-26 DIAGNOSIS — J441 Chronic obstructive pulmonary disease with (acute) exacerbation: Secondary | ICD-10-CM

## 2018-11-26 DIAGNOSIS — J9611 Chronic respiratory failure with hypoxia: Secondary | ICD-10-CM

## 2018-11-26 NOTE — Progress Notes (Signed)
[facility] [place], [place] [address]  Pulmonary Sleep Medicine   Office Visit Note  Patient Name: Lawrence Steele DOB: [birth year] MRN [id number]  Date of Service: 11/26/2018  Complaints/HPI: Pt is here for follow up on copd, OSA and respiratory failure. He reports he was once gain having issues with his heart rhythm.  He went to see Dr. Candis Musa and had another cardioversion. He continues to have some weakness, and sob since then. His oxygen level is 88% today on arrival.  He has oxygen that he wears at home day and night.  He sometimes travels with it, but today he didn't feel like he needed it. He reports he ran out of his Spiriva yesterday and has not had it refilled.      ROS  General: (-) fever, (-) chills, (-) night sweats, (-) weakness Skin: (-) rashes, (-) itching,. Eyes: (-) visual changes, (-) redness, (-) itching. Nose and Sinuses: (-) nasal stuffiness or itchiness, (-) postnasal drip, (-) nosebleeds, (-) sinus trouble. Mouth and Throat: (-) sore throat, (-) hoarseness. Neck: (-) swollen glands, (-) enlarged thyroid, (-) neck pain. Respiratory: - cough, (-) bloody sputum, + shortness of breath, + wheezing. Cardiovascular: - ankle swelling, (-) chest pain. Lymphatic: (-) lymph node enlargement. Neurologic: (-) numbness, (-) tingling. Psychiatric: (-) anxiety, (-) depression   Current Medication: Outpatient Encounter Medications as of 11/26/2018  Medication Sig Note  . ACCU-CHEK SOFTCLIX LANCETS lancets Use as instructed  Once a daily E11.65   . acetaminophen (TYLENOL) 500 MG tablet Take 500 mg by mouth every 6 (six) hours as needed.    Marland Kitchen albuterol (PROAIR HFA) 108 (90 Base) MCG/ACT inhaler Inhale 2 puffs into the lungs every 6 (six) hours as needed.   Marland Kitchen allopurinol (ZYLOPRIM) 100 MG tablet Take 1 tablet (100 mg total) by mouth 2 (two) times daily.   Marland Kitchen apixaban (ELIQUIS) 5 MG TABS tablet Take 1 tablet (5 mg total) by mouth 2 (two) times daily.   Marland Kitchen  atenolol (TENORMIN) 50 MG tablet Take 1 tablet (50 mg total) by mouth daily.   Marland Kitchen atorvastatin (LIPITOR) 20 MG tablet Take 1 tablet (20 mg total) by mouth daily.   . bisacodyl (DULCOLAX) 5 MG EC tablet Take 1 tablet (5 mg total) by mouth daily as needed for moderate constipation.   . Blood Glucose Monitoring Suppl (ACCU-CHEK AVIVA PLUS) w/Device KIT Use once daily to check blood sugar.   . cyclobenzaprine (FLEXERIL) 10 MG tablet Take 1 tablet (10 mg total) by mouth 2 (two) times daily as needed for muscle spasms.   . diclofenac sodium (VOLTAREN) 1 % GEL Apply 2 g topically 4 (four) times daily.    Marland Kitchen dicyclomine (BENTYL) 10 MG capsule Take 1 capsule (10 mg total) by mouth 4 (four) times daily -  before meals and at bedtime.   . digoxin (LANOXIN) 0.125 MG tablet Take 1 tablet (0.125 mg total) by mouth daily.   Marland Kitchen doxycycline (VIBRA-TABS) 100 MG tablet Take 1 tablet (100 mg total) by mouth daily. For acne.   . escitalopram (LEXAPRO) 5 MG tablet Take 1 tablet (5 mg total) by mouth daily.   . Fluticasone-Salmeterol (ADVAIR DISKUS) 250-50 MCG/DOSE AEPB Inhale 1 puff into the lungs 2 (two) times daily. Frequency:BID   Dosage:0.0     Instructions:  Note:Dose: 250-50MCG   . furosemide (LASIX) 20 MG tablet Take 1 tablet (20 mg total) by mouth daily.   Marland Kitchen gabapentin (NEURONTIN) 400 MG capsule Take 400 mg  by mouth 3 (three) times daily.    Marland Kitchen glimepiride (AMARYL) 1 MG tablet Take 2 tablets (2 mg total) by mouth daily with supper. (Patient taking differently: Take 2 mg by mouth 2 (two) times daily with a meal. )   . Glucose Blood (ACCU-CHEK AVIVA PLUS VI) by In Vitro route. Pt needs accu check Aviva plus lancets, test strips and meter to check blood sugars once daily   . glucose blood (ACCU-CHEK AVIVA PLUS) test strip 1 each by Other route as needed for other. Use as instructed   . HYDROcodone-acetaminophen (NORCO) 5-325 MG tablet Take 1 tablet by mouth 2 (two) times daily.    . hydroxypropyl methylcellulose /  hypromellose (ISOPTO TEARS / GONIOVISC) 2.5 % ophthalmic solution Place 1 drop into both eyes daily as needed for dry eyes.   Marland Kitchen ipratropium (ATROVENT) 0.02 % nebulizer solution Use every 6 hours for Shortness of breath and wheezing and as needed.  Prescription is for 90 days   . isosorbide mononitrate (IMDUR) 60 MG 24 hr tablet Take 1 tablet (60 mg total) by mouth daily.   Marland Kitchen loratadine (CLARITIN) 10 MG tablet Take 10 mg by mouth daily.    . meloxicam (MOBIC) 7.5 MG tablet Take 1 tablet (7.5 mg total) by mouth 2 (two) times daily.   . metFORMIN (GLUCOPHAGE) 500 MG tablet Take 1 tablet (500 mg total) by mouth 2 (two) times daily with a meal.   . nitroGLYCERIN (NITROSTAT) 0.4 MG SL tablet Place 0.4 mg under the tongue. 09/03/2018: Pt may need a refill because home medication is expired.   Marland Kitchen oxyCODONE-acetaminophen (PERCOCET) 7.5-325 MG tablet Take 1 tablet by mouth every 8 (eight) hours as needed for severe pain.   . OXYGEN Inhale 2 L into the lungs continuous.    . traZODone (DESYREL) 100 MG tablet Take 1 tablet (100 mg total) by mouth at bedtime.   Marland Kitchen umeclidinium bromide (INCRUSE ELLIPTA) 62.5 MCG/INH AEPB Inhale 1 puff into the lungs daily.    No facility-administered encounter medications on file as of 11/26/2018.     Surgical History: Past Surgical History:  Procedure Laterality Date  . ABDOMINAL AORTIC ANEURYSM REPAIR  03/06/2015   [place]    . APPENDECTOMY    . CARDIAC CATHETERIZATION  2010,03/16/2003,11/04/2002  . CARDIOVERSION N/A 04/24/2018   Procedure: CARDIOVERSION;  Surgeon: Nelva Bush, MD;  Location: [facility];  Service: Cardiovascular;  Laterality: N/A;  . CARDIOVERSION N/A 09/05/2018   Procedure: CARDIOVERSION;  Surgeon: Minna Merritts, MD;  Location: [facility];  Service: Cardiovascular;  Laterality: N/A;  . CARDIOVERSION N/A 11/24/2018   Procedure: CARDIOVERSION (CATH LAB);  Surgeon: Wellington Hampshire, MD;  Location: [facility];  Service: Cardiovascular;   Laterality: N/A;  . CORONARY ANGIOPLASTY  03/16/2003   stent to the RCA  & 2/18/2004stent mid circumflex  . ELBOW SURGERY    . HERNIA REPAIR    . NASAL SINUS SURGERY    . PENILE PROSTHESIS IMPLANT  1995  . PILONIDAL CYST EXCISION    . TEE WITHOUT CARDIOVERSION N/A 04/24/2018   Procedure: TRANSESOPHAGEAL ECHOCARDIOGRAM (TEE);  Surgeon: Nelva Bush, MD;  Location: [facility];  Service: Cardiovascular;  Laterality: N/A;  . TONSILECTOMY, ADENOIDECTOMY, BILATERAL MYRINGOTOMY AND TUBES    . UMBILICAL HERNIA REPAIR      Medical History: Past Medical History:  Diagnosis Date  . AAA (abdominal aortic aneurysm) ([place])    a.  Status post endovascular repair at [facility] in 2016  .  Anxiety   . Asthma   . Atrial flutter ([place])    a. diagnosed 7/19; b. CHADS2VASc => 5 (CHF, HTN, age x 1, DM, vascular disease); c. on Eliquis; d. s/p TEE/DCCV 8/19 with repeat DCCV 12/19  . Chronic diastolic CHF (congestive heart failure) ([place])   . COPD (chronic obstructive pulmonary disease) ([place])   . Coronary artery disease    a. LHC 5/10: left main 10%, mLAD-1 lesion 20%, mLAD-2 lesion 20%, D1 20%, mLCx-1 lesion 30%, mLCx-2 lesion 50%, OM1 20%, OM to 20%, LPL 20%, pRCA-1 lesion 20%, pRCA-2 lesion 20%.  EF 51% w/ mild inf HK  . Diabetes mellitus with complication ([place])   . Hyperlipidemia   . Hypertension   . Iliac artery aneurysm, bilateral ([place])   . Obstructive sleep apnea-hypopnea syndrome     Family History: Family History  Problem Relation Age of Onset  . Hypertension Mother   . Hyperlipidemia Mother   . Depression Mother   . Heart attack Father 83  . Heart disease Father   . Hypertension Father   . Hyperlipidemia Father     Social History: Social History   Socioeconomic History  . Marital status: Divorced    Spouse name: Not on file  . Number of children: 2  . Years of education: 10   . Highest education level: 10th grade  Occupational History  . Occupation: disabled   Social Needs  .  Financial resource strain: Somewhat hard  . Food insecurity:    Worry: Never true    Inability: Never true  . Transportation needs:    Medical: No    Non-medical: No  Tobacco Use  . Smoking status: Current Every Day Smoker    Packs/day: 0.25    Years: 45.00    Pack years: 11.25    Types: Cigarettes  . Smokeless tobacco: Former Systems developer    Types: Chew  . Tobacco comment: pt hasnt smoked lately due to health but states he still smokes  Substance and Sexual Activity  . Alcohol use: Not Currently  . Drug use: No  . Sexual activity: Not on file  Lifestyle  . Physical activity:    Days per week: 7 days    Minutes per session: 10 min  . Stress: Only a little  Relationships  . Social connections:    Talks on phone: More than three times a week    Gets together: More than three times a week    Attends religious service: Never    Active member of club or organization: No    Attends meetings of clubs or organizations: Not on file    Relationship status: Divorced  . Intimate partner violence:    Fear of current or ex partner: Not on file    Emotionally abused: Not on file    Physically abused: Not on file    Forced sexual activity: Not on file  Other Topics Concern  . Not on file  Social History Narrative  . Not on file    Vital Signs: Blood pressure 112/66, pulse 91, resp. rate 16, height 5' 10"  (1.778 m), weight 272 lb (123.4 kg), SpO2 (!) 88 %.  Examination: General Appearance: The patient is well-developed, well-nourished, and in no distress. Skin: Gross inspection of skin unremarkable. Head: normocephalic, no gross deformities. Eyes: no gross deformities noted. ENT: ears appear grossly normal no exudates. Neck: Supple. No thyromegaly. No LAD. Respiratory: . Cardiovascular: Normal S1 and S2 without murmur or rub. Extremities: No cyanosis. pulses are  equal. Neurologic: Alert and oriented. No involuntary movements.  LABS: Recent Results (from the past 2160 hour(s))  CBC      Status: Abnormal   Collection Time: 09/03/18  5:07 PM  Result Value Ref Range   WBC 9.0 4.0 - 10.5 K/uL   RBC 3.95 (L) 4.22 - 5.81 MIL/uL   Hemoglobin 13.8 13.0 - 17.0 g/dL   HCT 41.3 39.0 - 52.0 %   MCV 104.6 (H) 80.0 - 100.0 fL   MCH 34.9 (H) 26.0 - 34.0 pg   MCHC 33.4 30.0 - 36.0 g/dL   RDW 15.8 (H) 11.5 - 15.5 %   Platelets 122 (L) 150 - 400 K/uL   nRBC 0.0 0.0 - 0.2 %    Comment: Performed at [facility], [address] [place], [place] [address]  Basic metabolic panel     Status: Abnormal   Collection Time: 09/03/18  5:07 PM  Result Value Ref Range   Sodium 135 135 - 145 mmol/L   Potassium 4.3 3.5 - 5.1 mmol/L   Chloride 102 98 - 111 mmol/L   CO2 23 22 - 32 mmol/L   Glucose, Bld 376 (H) 70 - 99 mg/dL   BUN 23 8 - 23 mg/dL   Creatinine, Ser 0.97 0.61 - 1.24 mg/dL   Calcium 8.7 (L) 8.9 - 10.3 mg/dL   GFR calc non Af Amer >60 >60 mL/min   GFR calc Af Amer >60 >60 mL/min   Anion gap 10 5 - 15    Comment: Performed at [facility], [address] [place], [place] [address]  Troponin I - ONCE - STAT     Status: None   Collection Time: 09/03/18  5:07 PM  Result Value Ref Range   Troponin I <0.03 <0.03 ng/mL    Comment: Performed at [facility], [address] [place], [place] [address]  HIV antibody (Routine Testing)     Status: None   Collection Time: 09/03/18  9:14 PM  Result Value Ref Range   HIV Screen 4th Generation wRfx Non Reactive Non Reactive    Comment: (NOTE) Performed At: [facility] [place], [place] [id number] Rush Farmer MD SW:[id number]   Glucose, capillary     Status: Abnormal   Collection Time: 09/03/18  9:36 PM  Result Value Ref Range   Glucose-Capillary 330 (H) 70 - 99 mg/dL  Basic metabolic panel     Status: Abnormal   Collection Time: 09/04/18  3:12 AM  Result Value Ref Range   Sodium 135 135 - 145 mmol/L   Potassium 5.1 3.5 - 5.1 mmol/L   Chloride 103 98 - 111 mmol/L   CO2 24 22  - 32 mmol/L   Glucose, Bld 342 (H) 70 - 99 mg/dL   BUN 20 8 - 23 mg/dL   Creatinine, Ser 0.82 0.61 - 1.24 mg/dL   Calcium 8.6 (L) 8.9 - 10.3 mg/dL   GFR calc non Af Amer >60 >60 mL/min   GFR calc Af Amer >60 >60 mL/min   Anion gap 8 5 - 15    Comment: Performed at [facility], [address] [place], [place] [address]  CBC     Status: Abnormal   Collection Time: 09/04/18  3:12 AM  Result Value Ref Range   WBC 8.8 4.0 - 10.5 K/uL   RBC 4.05 (L) 4.22 - 5.81 MIL/uL   Hemoglobin 13.9 13.0 - 17.0 g/dL   HCT 42.9 39.0 - 52.0 %  MCV 105.9 (H) 80.0 - 100.0 fL   MCH 34.3 (H) 26.0 - 34.0 pg   MCHC 32.4 30.0 - 36.0 g/dL   RDW 15.3 11.5 - 15.5 %   Platelets 107 (L) 150 - 400 K/uL    Comment: Immature Platelet Fraction may be clinically indicated, consider ordering this additional test [id number]    nRBC 0.0 0.0 - 0.2 %    Comment: Performed at [facility], [address] [place], [place] [address]  Glucose, capillary     Status: Abnormal   Collection Time: 09/04/18  7:39 AM  Result Value Ref Range   Glucose-Capillary 297 (H) 70 - 99 mg/dL  Glucose, capillary     Status: Abnormal   Collection Time: 09/04/18 11:40 AM  Result Value Ref Range   Glucose-Capillary 264 (H) 70 - 99 mg/dL  Glucose, capillary     Status: Abnormal   Collection Time: 09/04/18  4:46 PM  Result Value Ref Range   Glucose-Capillary 264 (H) 70 - 99 mg/dL  Glucose, capillary     Status: Abnormal   Collection Time: 09/04/18  8:58 PM  Result Value Ref Range   Glucose-Capillary 323 (H) 70 - 99 mg/dL  Glucose, capillary     Status: Abnormal   Collection Time: 09/05/18  1:07 AM  Result Value Ref Range   Glucose-Capillary 270 (H) 70 - 99 mg/dL  CBC WITH DIFFERENTIAL     Status: Abnormal   Collection Time: 09/05/18  3:21 AM  Result Value Ref Range   WBC 11.5 (H) 4.0 - 10.5 K/uL   RBC 4.09 (L) 4.22 - 5.81 MIL/uL   Hemoglobin 13.9 13.0 - 17.0 g/dL   HCT 43.7 39.0 - 52.0 %   MCV 106.8 (H) 80.0 -  100.0 fL   MCH 34.0 26.0 - 34.0 pg   MCHC 31.8 30.0 - 36.0 g/dL   RDW 15.1 11.5 - 15.5 %   Platelets 123 (L) 150 - 400 K/uL   nRBC 0.0 0.0 - 0.2 %   Neutrophils Relative % 89 %   Neutro Abs 10.3 (H) 1.7 - 7.7 K/uL   Lymphocytes Relative 7 %   Lymphs Abs 0.8 0.7 - 4.0 K/uL   Monocytes Relative 3 %   Monocytes Absolute 0.4 0.1 - 1.0 K/uL   Eosinophils Relative 0 %   Eosinophils Absolute 0.0 0.0 - 0.5 K/uL   Basophils Relative 0 %   Basophils Absolute 0.0 0.0 - 0.1 K/uL   Immature Granulocytes 1 %   Abs Immature Granulocytes 0.08 (H) 0.00 - 0.07 K/uL    Comment: Performed at [facility], [address] [place], [place] [address]  Basic metabolic panel     Status: Abnormal   Collection Time: 09/05/18  3:21 AM  Result Value Ref Range   Sodium 136 135 - 145 mmol/L   Potassium 4.8 3.5 - 5.1 mmol/L   Chloride 103 98 - 111 mmol/L   CO2 24 22 - 32 mmol/L   Glucose, Bld 298 (H) 70 - 99 mg/dL   BUN 27 (H) 8 - 23 mg/dL   Creatinine, Ser 0.82 0.61 - 1.24 mg/dL   Calcium 8.8 (L) 8.9 - 10.3 mg/dL   GFR calc non Af Amer >60 >60 mL/min   GFR calc Af Amer >60 >60 mL/min   Anion gap 9 5 - 15    Comment: Performed at [facility], [address] [place], [place] [address]  Glucose, capillary     Status: Abnormal  Collection Time: 09/05/18  6:29 AM  Result Value Ref Range   Glucose-Capillary 253 (H) 70 - 99 mg/dL  Glucose, capillary     Status: Abnormal   Collection Time: 09/05/18  8:06 AM  Result Value Ref Range   Glucose-Capillary 256 (H) 70 - 99 mg/dL  Glucose, capillary     Status: Abnormal   Collection Time: 09/05/18 12:08 PM  Result Value Ref Range   Glucose-Capillary 214 (H) 70 - 99 mg/dL  Comprehensive metabolic panel     Status: Abnormal   Collection Time: 09/19/18 10:12 AM  Result Value Ref Range   Glucose 235 (H) 65 - 99 mg/dL   BUN 9 8 - 27 mg/dL   Creatinine, Ser 0.93 0.76 - 1.27 mg/dL   GFR calc non Af Amer 85 >59 mL/min/1.73   GFR calc Af Amer 98  >59 mL/min/1.73   BUN/Creatinine Ratio 10 10 - 24   Sodium 139 134 - 144 mmol/L   Potassium 4.7 3.5 - 5.2 mmol/L   Chloride 102 96 - 106 mmol/L   CO2 23 20 - 29 mmol/L   Calcium 8.8 8.6 - 10.2 mg/dL   Total Protein 7.0 6.0 - 8.5 g/dL   Albumin 3.7 3.6 - 4.8 g/dL   Globulin, Total 3.3 1.5 - 4.5 g/dL   Albumin/Globulin Ratio 1.1 (L) 1.2 - 2.2   Bilirubin Total 0.3 0.0 - 1.2 mg/dL   Alkaline Phosphatase 75 39 - 117 IU/L   AST 16 0 - 40 IU/L   ALT 19 0 - 44 IU/L  CBC     Status: Abnormal   Collection Time: 09/19/18 10:12 AM  Result Value Ref Range   WBC 9.2 3.4 - 10.8 x10E3/uL   RBC 3.67 (L) 4.14 - 5.80 x10E6/uL   Hemoglobin 12.9 (L) 13.0 - 17.7 g/dL   Hematocrit 37.0 (L) 37.5 - 51.0 %   MCV 101 (H) 79 - 97 fL   MCH 35.1 (H) 26.6 - 33.0 pg   MCHC 34.9 31.5 - 35.7 g/dL   RDW 14.4 12.3 - 15.4 %    Comment: **Effective September 22, 2018, the RDW pediatric reference**   interval will be removed and the adult reference interval   will be changing to:                             Male 11.7 - 15.4                                                      Male 11.6 - 15.4    Platelets 179 150 - 450 [id number]  Basic metabolic panel     Status: Abnormal   Collection Time: 11/21/18 10:45 AM  Result Value Ref Range   Glucose 151 (H) 65 - 99 mg/dL   BUN 10 8 - 27 mg/dL   Creatinine, Ser 0.85 0.76 - 1.27 mg/dL   GFR calc non Af Amer 90 >59 mL/min/1.73   GFR calc Af Amer 104 >59 mL/min/1.73   BUN/Creatinine Ratio 12 10 - 24   Sodium 140 134 - 144 mmol/L   Potassium 4.2 3.5 - 5.2 mmol/L   Chloride 100 96 - 106 mmol/L   CO2 21 20 - 29 mmol/L   Calcium 8.9 8.6 - 10.2 mg/dL  CBC w/Diff  Status: Abnormal   Collection Time: 11/21/18 10:45 AM  Result Value Ref Range   WBC 11.1 (H) 3.4 - 10.8 x10E3/uL   RBC 4.17 4.14 - 5.80 x10E6/uL   Hemoglobin 14.3 13.0 - 17.7 g/dL   Hematocrit 41.4 37.5 - 51.0 %   MCV 99 (H) 79 - 97 fL   MCH 34.3 (H) 26.6 - 33.0 pg   MCHC 34.5 31.5 - 35.7 g/dL   RDW 15.2  11.6 - 15.4 %   Platelets 161 150 - 450 x10E3/uL   Neutrophils 61 Not Estab. %   Lymphs 30 Not Estab. %   Monocytes 6 Not Estab. %   Eos 2 Not Estab. %   Basos 1 Not Estab. %   Neutrophils Absolute 6.8 1.4 - 7.0 x10E3/uL   Lymphocytes Absolute 3.3 (H) 0.7 - 3.1 x10E3/uL   Monocytes Absolute 0.7 0.1 - 0.9 x10E3/uL   EOS (ABSOLUTE) 0.2 0.0 - 0.4 x10E3/uL   Basophils Absolute 0.1 0.0 - 0.2 x10E3/uL   Immature Granulocytes 0 Not Estab. %   Immature Grans (Abs) 0.0 0.0 - 0.1 x10E3/uL  TSH     Status: None   Collection Time: 11/21/18 10:45 AM  Result Value Ref Range   TSH 0.924 0.450 - 4.500 uIU/mL  Glucose, capillary     Status: Abnormal   Collection Time: 11/24/18  7:29 AM  Result Value Ref Range   Glucose-Capillary 199 (H) 70 - 99 mg/dL    Radiology: No results found.  No results found.  No results found.    Assessment and Plan: Patient Active Problem List   Diagnosis Date Noted  . Chronic a-fib 05/02/2018  . Atrial flutter ([place]) 04/09/2018  . Uncontrolled type 2 diabetes mellitus with hyperglycemia ([place]) 01/09/2018  . Nicotine dependence with current use 01/09/2018  . Diabetes mellitus without complication ([place]) 11/29/9456  . Myalgia 09/19/2017  . Vitamin B12 deficiency anemia 09/19/2017  . Common migraine with intractable migraine 09/19/2017  . Chronic hypoxemic respiratory failure ([place]) 09/19/2017  . Peripheral vascular disease, unspecified ([place]) 09/19/2017  . Dysuria 09/19/2017  . Osteoarthritis 09/19/2017  . Diverticulitis of intestine 09/19/2017  . Cervicalgia 09/19/2017  . Unspecified abdominal hernia without obstruction or gangrene 09/19/2017  . Vasomotor rhinitis 09/19/2017  . Occlusion and stenosis of bilateral carotid arteries 09/19/2017  . Generalized anxiety disorder 09/19/2017  . Malaise 09/19/2017  . Chronic bronchitis ([place]) 09/19/2017  . Obstructive sleep apnea (adult) (pediatric) 09/19/2017  . Cardiac arrhythmia 09/19/2017  . Smoker 04/02/2016   . Pain in the chest 08/20/2014  . SOB (shortness of breath) 08/20/2014  . Coronary artery disease involving native coronary artery of native heart with angina pectoris with documented spasm ([place]) 08/20/2014  . Emphysema of lung ([place]) 08/20/2014  . Coronary artery disease involving native coronary artery of native heart with unstable angina pectoris ([place]) 08/20/2014  . AAA (abdominal aortic aneurysm) without rupture ([place]) 08/20/2014  . Iliac artery aneurysm, bilateral ([place]) 08/20/2014  . Hyperlipidemia 08/20/2014  . Essential hypertension 08/20/2014  . Morbid obesity ([place]) 08/20/2014   1. Chronic obstructive pulmonary disease with acute exacerbation (HCC) Stable, unable to do spirometry due to SOB.  He ran out of his spiriva, and I provided him with a sample in office.  His lung sounds improved after a few minutes.   2. Chronic hypoxemic respiratory failure (HCC) Room air oxygen saturation is 88% today.  HE did not bring his oxygen.   3. Nicotine dependence with current use Unfortunately continues to smoke. Smoking cessation  counseling: 1. Pt acknowledges the risks of long term smoking, she will try to quite smoking. 2. Options for different medications including nicotine products, chewing gum, patch etc, Wellbutrin and Chantix is discussed 3. Goal and date of compete cessation is discussed 4. Total time spent in smoking cessation is 15 min.  4. Atrial fibrillation, unspecified type (HCC) Stable, has been cardioverted 3 times in 6 months.  Continue to follow up with cardiology.  They are discussing ablation at this time.   5. Essential hypertension Well controlled at this time. Continue current therapy.   6. SOB (shortness of breath) Spirometry canceled due to patients sob.    General Counseling: I have discussed the findings of the evaluation and examination with Seif.  I have also discussed any further diagnostic evaluation thatmay be needed or ordered today. M[place] verbalizes  understanding of the findings of todays visit. We also reviewed his medications today and discussed drug interactions and side effects including but not limited excessive drowsiness and altered mental states. We also discussed that there is always a risk not just to him but also people around him. he has been encouraged to call the office with any questions or concerns that should arise related to todays visit.    Time spent: 25 This patient was seen by Orson Gear AGNP-C in Collaboration with Dr. Devona Konig as a part of collaborative care agreement.   I have personally obtained a history, examined the patient, evaluated laboratory and imaging results, formulated the assessment and plan and placed orders.    [name], MD [facility] Pulmonary and Critical Care Sleep medicine

## 2018-11-27 ENCOUNTER — Other Ambulatory Visit: Payer: Self-pay | Admitting: Internal Medicine

## 2018-11-30 NOTE — Progress Notes (Signed)
Virtual Visit via Telephone Note     Evaluation Performed:  Follow-up visit  This visit type was conducted due to national recommendations for restrictions regarding the COVID-19 Pandemic (e.g. social distancing).  This format is felt to be most appropriate for this patient at this time.  All issues noted in this document were discussed and addressed.  No physical exam was performed (except for noted visual exam findings with Telehealth visits).  See MyChart message from today for the patient's consent to telehealth for [facility].  Date:  12/11/2018   ID:  Lawrence Steele, DOB [birth year], MRN [id number]  Patient Location:  [place] [address]   Provider location:   [facility] Office  PCP:  Ronnell Freshwater, NP  Cardiologist:  Ida Rogue, MD  Electrophysiologist:  None   Chief Complaint:  Follow up DCCV  History of Present Illness:    Lawrence Steele is a 68 y.o. male who presents via audio/video conferencing for a telehealth visit today.  He has history of CAD as outlined below, atrial flutter on Eliquis status post TEE/DCCV 04/2018 with repeat DCCV in 08/2018 and 11/2018, AAA status post endovascular repair at [facility] [place] [facility] in 2016, HFpEF, COPD with ongoing tobacco abuse on chronic oxygen at nighttime, DM2, HTN, HLD with statin intolerance, alcohol abuse, low back pain with peripheral neuropathy with previous falls, and morbid obesity with OSA and possible OHS who presents for tele-health follow up of recent DCCV on 11/24/2018 and acute on chronic diastolic CHF.   Past surgical history indicates the patient has previously undergone PCI to the RCA and LCx in 2004.  Details of this are unclear.  Patient most recently underwent cardiac cath on 01/18/2014 that showed left main 10% stenosis, mid LAD-1 lesion 20% stenosis, mid LAD-2 lesion 20% stenosis, D1 20% stenosis, mid LCx-1 lesion 30% stenosis, mid LCx-2 lesion 50% stenosis, OM1 20% stenosis, OM to 20% stenosis, LPL 20%  stenosis, proximal RCA-1 lesion 20% stenosis, proximal RCA-2 lesion 20% stenosis.  EF 51% with mild inferior hypokinesis.  Echo at [facility] from 2015 showed normal LV systolic function, dilated thoracic aorta measuring 4 cm at the sinus of Valsalva and 3.6 cm above the sinotubular junction, LVH, otherwise unrevealing study.  Patient most recently underwent ischemic evaluation via nuclear stress test in 10/2016 that showed no significant ischemia with a small region of fixed perfusion defect noted in the inferior apical region on attenuation corrected images.  EF estimated at 62%.  He was noted to have frequent PACs.  This was a low risk scan.  Patient was diagnosed with atrial flutter with RVR in 03/2018.  He was started on Eliquis at that time.  He underwent TEE guided cardioversion on 04/24/2018 that was successful.  TEE showed an EF of 55 to 60%.  Most recent CT abdomen done at [facility] and 09/2015 showed stable aortoiliac endograft with decreased excluded aneurysm size.  Stable bilateral common iliac artery aneurysms, stable anterior abdominal wall hernia containing nonobstructed small bowel, anterior bladder, and penile prosthesis reservoir.  Patient was admitted on 09/03/18 after developing an upper respiratory infection the week prior and was seen by his pulmonologist with a heart rate in the 140s beats per minute.  He was sent to the ED where he was noted to be in atrial flutter with RVR.  He reported compliance with Eliquis.  Cardiac enzymes negative.  Labs were largely unrevealing outside of hyperglycemia and a mildly elevated WBC on day of discharge.  CXR  showed no active cardiopulmonary disease with stable cardiomegaly.  The patient underwent successful cardioversion on 09/05/2018.  Patient called our office on 09/11/18 noting significant lower extremity swelling and weight gain.  Weight at outside office was noted to be 275 at that time.  Patient was uncertain what his weight was at home as he did not have a  scale at that time.  Upon reviewing his prior office visit with [place] it appears his weight is anywhere between 250 and 260 pounds.  He was started on Lasix and advised to pick up a scale.  When patient obtain a scale on 12/27 his home weight was 284 pounds.  Following outpatient diuresis with Lasix his weight has improved to 262 pounds as of 09/19/2018.    He continued to note some lower extremity swelling, abdominal fullness, and SOB though this was improved.  He continued to smoke and was not interested in quitting.  In the setting of his ongoing symptoms, his Lasix was increased to 20 mg twice daily for 3 days followed by resumption of 20 mg daily thereafter.  Echo was performed on 10/09/2018 that showed an EF of 60 to 65%, mildly dilated LV, no regional wall motion abnormalities, grade 2 diastolic dysfunction, mildly dilated left atrium, RV systolic function normal, PASP normal.  He presented as an RN visit earlier this month for recurrent tachypalpitations and lower extremity swelling.  He was noted to be back in atrial flutter with RVR and mildly volume overloaded, with a weight of 263 pounds.  He was gently diuresed and underwent successful cardioversion on 11/24/2018.  He has been referred to EP for consideration of atrial flutter ablation.  In speaking with the patient over the phone today he indicates he is doing quite well.  He states this is the best he has felt and quite a long period of time.  He denies any symptoms concerning for recurrence of atrial flutter or tachypalpitations.  Shortness of breath is stable and at his baseline.  Unfortunately, he has forgotten his morning Lasix several times in a row though over the past 2 days has remembered to take this and noted significant improvement in lower extremity swelling and weight loss.  Current weight this morning of 262.6 pounds.  His dry weight more recently has been around 260 pounds.  He has stable two-pillow orthopnea and denies any abdominal  distention or early satiety.  No chest pain.  No falls, BRBPR, or melena.  Blood pressure at home has remained well controlled in the 130s over 70s.  He does not have any issues or concerns to discuss today.  The patient does not have symptoms concerning for COVID-19 infection (fever, chills, cough, or new SHORTNESS OF BREATH).    Prior CV studies:   The following studies were reviewed today:  2D echo 10/09/2018: - Left ventricle: The cavity size was mildly dilated. Systolic   function was normal. The estimated ejection fraction was in the   range of 60% to 65%. Wall motion was normal; there were no   regional wall motion abnormalities. Features are consistent with   a pseudonormal left ventricular filling pattern, with concomitant   abnormal relaxation and increased filling pressure (grade 2   diastolic dysfunction). - Left atrium: The atrium was mildly dilated. - Right ventricle: Systolic function was normal. - Pulmonary arteries: Systolic pressure was within the normal   range.  Impressions:  - Challenging image quality. Rhythym appears to be NSR. __________  [facility] 08/2014: Left main 10%  stenosis, mid LAD sequential 20% stenosis, D1 20% stenosis, mid LCx lesion 130% stenosis, mid LCx lesion to 50% stenosis, small OM1 20% stenosis, large OM to 20% stenosis, proximal RCA sequential 20% stenosis __________  Past Medical History:  Diagnosis Date   AAA (abdominal aortic aneurysm) ([place])    a.  Status post endovascular repair at [facility] in 2016   Anxiety    Asthma    Atrial flutter ([place])    a. diagnosed 7/19; b. CHADS2VASc => 5 (CHF, HTN, age x 1, DM, vascular disease); c. on Eliquis; d. s/p TEE/DCCV 8/19 with repeat DCCV 12/19   Chronic diastolic CHF (congestive heart failure) ([place])    COPD (chronic obstructive pulmonary disease) ([place])    Coronary artery disease    a. LHC 5/10: left main 10%, mLAD-1 lesion 20%, mLAD-2 lesion 20%, D1 20%, mLCx-1 lesion 30%, mLCx-2 lesion 50%,  OM1 20%, OM to 20%, LPL 20%, pRCA-1 lesion 20%, pRCA-2 lesion 20%.  EF 51% w/ mild inf HK   Diabetes mellitus with complication (HCC)    Hyperlipidemia    Hypertension    Iliac artery aneurysm, bilateral ([place])    Obstructive sleep apnea-hypopnea syndrome    Past Surgical History:  Procedure Laterality Date   ABDOMINAL AORTIC ANEURYSM REPAIR  03/06/2015   [place] CATHETERIZATION  2010,03/16/2003,11/04/2002   CARDIOVERSION N/A 04/24/2018   Procedure: CARDIOVERSION;  Surgeon: Nelva Bush, MD;  Location: [facility];  Service: Cardiovascular;  Laterality: N/A;   CARDIOVERSION N/A 09/05/2018   Procedure: CARDIOVERSION;  Surgeon: Minna Merritts, MD;  Location: [facility];  Service: Cardiovascular;  Laterality: N/A;   CARDIOVERSION N/A 11/24/2018   Procedure: CARDIOVERSION (CATH LAB);  Surgeon: Wellington Hampshire, MD;  Location: [facility];  Service: Cardiovascular;  Laterality: N/A;   CORONARY ANGIOPLASTY  03/16/2003   stent to the RCA  & 2/18/2004stent mid circumflex   ELBOW SURGERY     HERNIA REPAIR     NASAL SINUS SURGERY     PENILE PROSTHESIS IMPLANT  1995   PILONIDAL CYST EXCISION     TEE WITHOUT CARDIOVERSION N/A 04/24/2018   Procedure: TRANSESOPHAGEAL ECHOCARDIOGRAM (TEE);  Surgeon: Nelva Bush, MD;  Location: [facility];  Service: Cardiovascular;  Laterality: N/A;   TONSILECTOMY, ADENOIDECTOMY, BILATERAL MYRINGOTOMY AND TUBES     UMBILICAL HERNIA REPAIR       Current Meds  Medication Sig   Accu-Chek Softclix Lancets lancets Use as instructed  Once a daily E11.65   acetaminophen (TYLENOL) 500 MG tablet Take 500 mg by mouth every 6 (six) hours as needed.    albuterol (PROAIR HFA) 108 (90 Base) MCG/ACT inhaler Inhale 2 puffs into the lungs every 6 (six) hours as needed.   allopurinol (ZYLOPRIM) 100 MG tablet Take 1 tablet (100 mg total) by mouth 2 (two) times daily.   apixaban (ELIQUIS) 5 MG TABS tablet Take 1  tablet (5 mg total) by mouth 2 (two) times daily.   atenolol (TENORMIN) 50 MG tablet Take 1 tablet (50 mg total) by mouth daily.   atorvastatin (LIPITOR) 20 MG tablet Take 1 tablet (20 mg total) by mouth daily.   bisacodyl (DULCOLAX) 5 MG EC tablet Take 1 tablet (5 mg total) by mouth daily as needed for moderate constipation.   Blood Glucose Monitoring Suppl (ACCU-CHEK AVIVA PLUS) w/Device KIT Use once daily to check blood sugar.   Blood Glucose Monitoring Suppl (ACCU-CHEK AVIVA PLUS) w/Device KIT Use  as directed   dicyclomine (BENTYL) 10 MG capsule Take 1 capsule (10 mg total) by mouth 4 (four) times daily -  before meals and at bedtime.   escitalopram (LEXAPRO) 5 MG tablet Take 1 tablet (5 mg total) by mouth daily.   furosemide (LASIX) 20 MG tablet Take 1 tablet (20 mg total) by mouth daily.   gabapentin (NEURONTIN) 400 MG capsule Take 400 mg by mouth 3 (three) times daily.    glimepiride (AMARYL) 1 MG tablet Take 2 tablets (2 mg total) by mouth daily with supper. (Patient taking differently: Take 2 mg by mouth 2 (two) times daily with a meal. )   glucose blood (ACCU-CHEK AVIVA PLUS) test strip 1 each by Other route as needed for other. Use as instructed   glucose blood (ACCU-CHEK AVIVA PLUS) test strip Pt needs accu check Aviva plus lancets, test strips and meter to check blood sugars once daily   HYDROcodone-acetaminophen (NORCO) 5-325 MG tablet Take 1 tablet by mouth 2 (two) times daily.    hydroxypropyl methylcellulose / hypromellose (ISOPTO TEARS / GONIOVISC) 2.5 % ophthalmic solution Place 1 drop into both eyes daily as needed for dry eyes.   ipratropium (ATROVENT) 0.02 % nebulizer solution Use every 6 hours for Shortness of breath and wheezing and as needed.  Prescription is for 90 days   isosorbide mononitrate (IMDUR) 60 MG 24 hr tablet Take 1 tablet (60 mg total) by mouth daily.   metFORMIN (GLUCOPHAGE) 500 MG tablet Take 1 tablet (500 mg total) by mouth 2 (two) times  daily with a meal.   nitroGLYCERIN (NITROSTAT) 0.4 MG SL tablet Place 0.4 mg under the tongue.   OXYGEN Inhale 2 L into the lungs continuous.    SPIRIVA HANDIHALER 18 MCG inhalation capsule Inhale 1 puff by mouth once daily   traZODone (DESYREL) 100 MG tablet Take 1 tablet (100 mg total) by mouth at bedtime.   umeclidinium bromide (INCRUSE ELLIPTA) 62.5 MCG/INH AEPB Inhale 1 puff into the lungs daily.   [DISCONTINUED] digoxin (LANOXIN) 0.125 MG tablet Take 1 tablet (0.125 mg total) by mouth daily.     Allergies:   Codeine   Social History   Tobacco Use   Smoking status: Current Every Day Smoker    Packs/day: 0.25    Years: 45.00    Pack years: 11.25    Types: Cigarettes   Smokeless tobacco: Former Systems developer    Types: Chew   Tobacco comment: pt hasnt smoked lately due to health but states he still smokes  Substance Use Topics   Alcohol use: Not Currently   Drug use: No     Family Hx: The patient's family history includes Depression in his mother; Heart attack (age of onset: 53) in his father; Heart disease in his father; Hyperlipidemia in his father and mother; Hypertension in his father and mother.  ROS:   Please see the history of present illness.     All other systems reviewed and are negative.   Labs/Other Tests and Data Reviewed:    Recent Labs: 09/19/2018: ALT 19 11/21/2018: BUN 10; Creatinine, Ser 0.85; Hemoglobin 14.3; Platelets 161; Potassium 4.2; Sodium 140; TSH 0.924   Recent Lipid Panel Lab Results  Component Value Date/Time   CHOL 199 10/25/2016 07:53 AM   TRIG 114 10/25/2016 07:53 AM   HDL 56 10/25/2016 07:53 AM   CHOLHDL 3.6 10/25/2016 07:53 AM   LDLCALC 120 (H) 10/25/2016 07:53 AM    Wt Readings from Last 3 Encounters:  12/11/18 262  lb 9.6 oz (119.1 kg)  12/04/18 265 lb 6.4 oz (120.4 kg)  11/26/18 272 lb (123.4 kg)     Exam:    Vital Signs:  BP 135/77    Pulse 61    Wt 262 lb 9.6 oz (119.1 kg)    BMI 37.68 kg/m    Well nourished, well  developed male in no acute distress.   ASSESSMENT & PLAN:    1.  CAD involving the native coronary arteries without angina: -He is doing well without any symptoms concerning for angina -On Eliquis in place of aspirin -Continue atenolol and Lipitor -Aggressive risk factor modification including smoking cessation and secondary prevention -No plans for ischemic evaluation at this time  2.  Atrial flutter status post recent cardioversion: -Based on patient's home report, he appears to be maintaining sinus rhythm with heart rates typically in the 60s to 70s bpm -No symptoms concerning for tachypalpitations -Has been compliant with Eliquis with recent CBC and renal function earlier this month being stable -Continue rate control with atenolol -Now that he has been restored to sinus rhythm, we will discontinue digoxin -Continue Eliquis -He has a scheduled consultation with electrophysiology on 01/22/2019 for consideration of atrial flutter ablation  3.  HFpEF: -Self-reported weights are at his approximate baseline with a weight this morning of 262.6 pounds with minimal lower extremity swelling and stable two-pillow orthopnea -Unfortunately, he has forgotten to take his Lasix a couple times recently though over the past 2 days has remembered to take this in the morning and has noted improvement in his weight and lower extremity swelling -Hopefully following repeat cardioversion and with medication/dietary compliance his volume status will remain stable -It remains important for the patient to be evaluated by EP for consideration of atrial flutter ablation in an effort to decrease his diastolic heart failure exacerbations -CHF education discussed in detail  4.  Hypertension: -Self-reported blood pressures have been well controlled in the low to mid 130s over 70s -Continue current dose of atenolol and Lasix  5.  Hyperlipidemia: -Tolerating Lipitor -Most recent LDL of 120 from 10/2016 -Following  lessening of COVID-19 restrictions plan for fasting lipid panel and liver function  6.  AAA: -Status post endovascular repair -Followed by [facility]  COVID-19 Education: The signs and symptoms of COVID-19 were discussed with the patient and how to seek care for testing (follow up with PCP or arrange E-visit).  The importance of social distancing was discussed today.  Patient Risk:   After full review of this patients clinical status, I feel that they are at least moderate risk at this time.  Time:   Today, I have spent 12 minutes with the patient with telehealth technology discussing the above.     Medication Adjustments/Labs and Tests Ordered: Current medicines are reviewed at length with the patient today.  Concerns regarding medicines are outlined above.  Tests Ordered: No orders of the defined types were placed in this encounter.  Medication Changes: Digoxin discontinued.   Disposition:  As directed with EP on 01/22/2019 for consideration of atrial flutter ablation.  Patient was advised pending the medical climate at that time this also may need to be a telephone visit.  Signed, Christell Faith, PA-C  12/11/2018 9:58 AM    [facility]

## 2018-12-01 ENCOUNTER — Other Ambulatory Visit: Payer: Self-pay

## 2018-12-01 MED ORDER — ACCU-CHEK AVIVA PLUS W/DEVICE KIT: PACK | 0 refills | Status: DC

## 2018-12-01 MED ORDER — ACCU-CHEK SOFTCLIX LANCETS MISC: 1 refills | Status: DC

## 2018-12-01 MED ORDER — GLUCOSE BLOOD VI STRP: ORAL_STRIP | 12 refills | Status: DC

## 2018-12-04 ENCOUNTER — Ambulatory Visit (INDEPENDENT_AMBULATORY_CARE_PROVIDER_SITE_OTHER): Payer: Medicare HMO | Admitting: Nurse Practitioner

## 2018-12-04 ENCOUNTER — Encounter: Payer: Self-pay | Admitting: Nurse Practitioner

## 2018-12-04 VITALS — BP 103/56 | HR 78 | Resp 16 | Ht 70.0 in | Wt 265.4 lb

## 2018-12-04 DIAGNOSIS — E1165 Type 2 diabetes mellitus with hyperglycemia: Secondary | ICD-10-CM

## 2018-12-04 DIAGNOSIS — F411 Generalized anxiety disorder: Secondary | ICD-10-CM | POA: Diagnosis not present

## 2018-12-04 DIAGNOSIS — I4891 Unspecified atrial fibrillation: Secondary | ICD-10-CM | POA: Diagnosis not present

## 2018-12-04 DIAGNOSIS — I10 Essential (primary) hypertension: Secondary | ICD-10-CM

## 2018-12-04 DIAGNOSIS — L700 Acne vulgaris: Secondary | ICD-10-CM

## 2018-12-04 LAB — POCT GLYCOSYLATED HEMOGLOBIN (HGB A1C): Hemoglobin A1C: 6.9 % — AB (ref 4.0–5.6)

## 2018-12-04 MED ORDER — TRAZODONE HCL 100 MG PO TABS: 100.0000 mg | ORAL_TABLET | Freq: Every day | ORAL | 1 refills | Status: DC

## 2018-12-04 MED ORDER — DOXYCYCLINE HYCLATE 100 MG PO TABS: 100.0000 mg | ORAL_TABLET | Freq: Every day | ORAL | 1 refills | Status: DC

## 2018-12-04 NOTE — Progress Notes (Signed)
Pt blood pressure was low informed provider.

## 2018-12-04 NOTE — Progress Notes (Signed)
[facility] [place], [place] [address]  Internal MEDICINE  Office Visit Note  Patient Name: Lawrence Steele  [id number]  [id number]  Date of Service: 12/05/2018  Chief Complaint  Patient presents with  . Medical Management of Chronic Issues  . Hypertension  . Hyperlipidemia  . Diabetes    A1C  . Labs Only    Lab results    The patient was recently hospitalized for a-fib. Cardiology has been trying to "shoxk" the heart back into normal sinus rhythm. Still having intermittent A-fib. Currently on digoxin to help control heart rate. BP on low side, his cardiologist is aware. He is to see his cardiologist next week.   Diabetes  He presents for his follow-up diabetic visit. He has type 2 diabetes mellitus. No MedicAlert identification noted. His disease course has been improving. Hypoglycemia symptoms include headaches and nervousness/anxiousness. Associated symptoms include blurred vision and fatigue. Pertinent negatives for diabetes include no chest pain, no polydipsia and no polyuria. There are no hypoglycemic complications. Symptoms are improving. Diabetic complications include peripheral neuropathy. Risk factors for coronary artery disease include dyslipidemia, diabetes mellitus, hypertension, male sex, sedentary lifestyle, stress and tobacco exposure. Current diabetic treatment includes oral agent (monotherapy). He is compliant with treatment all of the time. His weight is increasing steadily. He is following a generally healthy diet. Meal planning includes avoidance of concentrated sweets. He has not had a previous visit with a dietitian. He rarely participates in exercise. His home blood glucose trend is decreasing steadily. An ACE inhibitor/angiotensin II receptor blocker is not being taken.       Current Medication: Outpatient Encounter Medications as of 12/04/2018  Medication Sig Note  . Accu-Chek Softclix Lancets lancets Use as instructed  Once a daily  E11.65   . acetaminophen (TYLENOL) 500 MG tablet Take 500 mg by mouth every 6 (six) hours as needed.    Marland Kitchen albuterol (PROAIR HFA) 108 (90 Base) MCG/ACT inhaler Inhale 2 puffs into the lungs every 6 (six) hours as needed.   Marland Kitchen allopurinol (ZYLOPRIM) 100 MG tablet Take 1 tablet (100 mg total) by mouth 2 (two) times daily.   Marland Kitchen apixaban (ELIQUIS) 5 MG TABS tablet Take 1 tablet (5 mg total) by mouth 2 (two) times daily.   Marland Kitchen atenolol (TENORMIN) 50 MG tablet Take 1 tablet (50 mg total) by mouth daily.   Marland Kitchen atorvastatin (LIPITOR) 20 MG tablet Take 1 tablet (20 mg total) by mouth daily.   . bisacodyl (DULCOLAX) 5 MG EC tablet Take 1 tablet (5 mg total) by mouth daily as needed for moderate constipation.   . Blood Glucose Monitoring Suppl (ACCU-CHEK AVIVA PLUS) w/Device KIT Use once daily to check blood sugar.   . Blood Glucose Monitoring Suppl (ACCU-CHEK AVIVA PLUS) w/Device KIT Use as directed   . cyclobenzaprine (FLEXERIL) 10 MG tablet Take 1 tablet (10 mg total) by mouth 2 (two) times daily as needed for muscle spasms.   . diclofenac sodium (VOLTAREN) 1 % GEL Apply 2 g topically 4 (four) times daily.    Marland Kitchen dicyclomine (BENTYL) 10 MG capsule Take 1 capsule (10 mg total) by mouth 4 (four) times daily -  before meals and at bedtime.   . digoxin (LANOXIN) 0.125 MG tablet Take 1 tablet (0.125 mg total) by mouth daily.   Marland Kitchen doxycycline (VIBRA-TABS) 100 MG tablet Take 1 tablet (100 mg total) by mouth daily. For acne.   . escitalopram (LEXAPRO) 5 MG tablet Take 1 tablet (5 mg total)  by mouth daily.   . Fluticasone-Salmeterol (ADVAIR DISKUS) 250-50 MCG/DOSE AEPB Inhale 1 puff into the lungs 2 (two) times daily. Frequency:BID   Dosage:0.0     Instructions:  Note:Dose: 250-50MCG   . furosemide (LASIX) 20 MG tablet Take 1 tablet (20 mg total) by mouth daily.   Marland Kitchen gabapentin (NEURONTIN) 400 MG capsule Take 400 mg by mouth 3 (three) times daily.    Marland Kitchen glimepiride (AMARYL) 1 MG tablet Take 2 tablets (2 mg total) by mouth  daily with supper. (Patient taking differently: Take 2 mg by mouth 2 (two) times daily with a meal. )   . glucose blood (ACCU-CHEK AVIVA PLUS) test strip 1 each by Other route as needed for other. Use as instructed   . glucose blood (ACCU-CHEK AVIVA PLUS) test strip Pt needs accu check Aviva plus lancets, test strips and meter to check blood sugars once daily   . HYDROcodone-acetaminophen (NORCO) 5-325 MG tablet Take 1 tablet by mouth 2 (two) times daily.    . hydroxypropyl methylcellulose / hypromellose (ISOPTO TEARS / GONIOVISC) 2.5 % ophthalmic solution Place 1 drop into both eyes daily as needed for dry eyes.   Marland Kitchen ipratropium (ATROVENT) 0.02 % nebulizer solution Use every 6 hours for Shortness of breath and wheezing and as needed.  Prescription is for 90 days   . isosorbide mononitrate (IMDUR) 60 MG 24 hr tablet Take 1 tablet (60 mg total) by mouth daily.   Marland Kitchen loratadine (CLARITIN) 10 MG tablet Take 10 mg by mouth daily.    . meloxicam (MOBIC) 7.5 MG tablet Take 1 tablet (7.5 mg total) by mouth 2 (two) times daily.   . metFORMIN (GLUCOPHAGE) 500 MG tablet Take 1 tablet (500 mg total) by mouth 2 (two) times daily with a meal.   . nitroGLYCERIN (NITROSTAT) 0.4 MG SL tablet Place 0.4 mg under the tongue. 09/03/2018: Pt may need a refill because home medication is expired.   Marland Kitchen oxyCODONE-acetaminophen (PERCOCET) 7.5-325 MG tablet Take 1 tablet by mouth every 8 (eight) hours as needed for severe pain.   . OXYGEN Inhale 2 L into the lungs continuous.    Marland Kitchen SPIRIVA HANDIHALER 18 MCG inhalation capsule Inhale 1 puff by mouth once daily   . traZODone (DESYREL) 100 MG tablet Take 1 tablet (100 mg total) by mouth at bedtime.   Marland Kitchen umeclidinium bromide (INCRUSE ELLIPTA) 62.5 MCG/INH AEPB Inhale 1 puff into the lungs daily.   . [DISCONTINUED] doxycycline (VIBRA-TABS) 100 MG tablet Take 1 tablet (100 mg total) by mouth daily. For acne.   . [DISCONTINUED] traZODone (DESYREL) 100 MG tablet Take 1 tablet (100 mg  total) by mouth at bedtime.    No facility-administered encounter medications on file as of 12/04/2018.     Surgical History: Past Surgical History:  Procedure Laterality Date  . ABDOMINAL AORTIC ANEURYSM REPAIR  03/06/2015   [place]    . APPENDECTOMY    . CARDIAC CATHETERIZATION  2010,03/16/2003,11/04/2002  . CARDIOVERSION N/A 04/24/2018   Procedure: CARDIOVERSION;  Surgeon: Nelva Bush, MD;  Location: [place] [facility];  Service: Cardiovascular;  Laterality: N/A;  . CARDIOVERSION N/A 09/05/2018   Procedure: CARDIOVERSION;  Surgeon: Minna Merritts, MD;  Location: [facility];  Service: Cardiovascular;  Laterality: N/A;  . CARDIOVERSION N/A 11/24/2018   Procedure: CARDIOVERSION (CATH LAB);  Surgeon: Wellington Hampshire, MD;  Location: [facility];  Service: Cardiovascular;  Laterality: N/A;  . CORONARY ANGIOPLASTY  03/16/2003   stent to the RCA  &  2/18/2004stent mid circumflex  . ELBOW SURGERY    . HERNIA REPAIR    . NASAL SINUS SURGERY    . PENILE PROSTHESIS IMPLANT  1995  . PILONIDAL CYST EXCISION    . TEE WITHOUT CARDIOVERSION N/A 04/24/2018   Procedure: TRANSESOPHAGEAL ECHOCARDIOGRAM (TEE);  Surgeon: Nelva Bush, MD;  Location: [facility];  Service: Cardiovascular;  Laterality: N/A;  . TONSILECTOMY, ADENOIDECTOMY, BILATERAL MYRINGOTOMY AND TUBES    . UMBILICAL HERNIA REPAIR      Medical History: Past Medical History:  Diagnosis Date  . AAA (abdominal aortic aneurysm) ([place])    a.  Status post endovascular repair at [facility] in 2016  . Anxiety   . Asthma   . Atrial flutter ([place])    a. diagnosed 7/19; b. CHADS2VASc => 5 (CHF, HTN, age x 1, DM, vascular disease); c. on Eliquis; d. s/p TEE/DCCV 8/19 with repeat DCCV 12/19  . Chronic diastolic CHF (congestive heart failure) ([place])   . COPD (chronic obstructive pulmonary disease) ([place])   . Coronary artery disease    a. LHC 5/10: left main 10%, mLAD-1 lesion 20%, mLAD-2 lesion 20%, D1 20%, mLCx-1 lesion 30%, mLCx-2 lesion 50%,  OM1 20%, OM to 20%, LPL 20%, pRCA-1 lesion 20%, pRCA-2 lesion 20%.  EF 51% w/ mild inf HK  . Diabetes mellitus with complication ([place])   . Hyperlipidemia   . Hypertension   . Iliac artery aneurysm, bilateral ([place])   . Obstructive sleep apnea-hypopnea syndrome     Family History: Family History  Problem Relation Age of Onset  . Hypertension Mother   . Hyperlipidemia Mother   . Depression Mother   . Heart attack Father 93  . Heart disease Father   . Hypertension Father   . Hyperlipidemia Father     Social History   Socioeconomic History  . Marital status: Divorced    Spouse name: Not on file  . Number of children: 2  . Years of education: 10   . Highest education level: 10th grade  Occupational History  . Occupation: disabled   Social Needs  . Financial resource strain: Somewhat hard  . Food insecurity:    Worry: Never true    Inability: Never true  . Transportation needs:    Medical: No    Non-medical: No  Tobacco Use  . Smoking status: Current Every Day Smoker    Packs/day: 0.25    Years: 45.00    Pack years: 11.25    Types: Cigarettes  . Smokeless tobacco: Former Systems developer    Types: Chew  . Tobacco comment: pt hasnt smoked lately due to health but states he still smokes  Substance and Sexual Activity  . Alcohol use: Not Currently  . Drug use: No  . Sexual activity: Not on file  Lifestyle  . Physical activity:    Days per week: 7 days    Minutes per session: 10 min  . Stress: Only a little  Relationships  . Social connections:    Talks on phone: More than three times a week    Gets together: More than three times a week    Attends religious service: Never    Active member of club or organization: No    Attends meetings of clubs or organizations: Not on file    Relationship status: Divorced  . Intimate partner violence:    Fear of current or ex partner: Not on file    Emotionally abused: Not on file    Physically abused: Not on file  Forced sexual  activity: Not on file  Other Topics Concern  . Not on file  Social History Narrative  . Not on file      Review of Systems  Constitutional: Positive for fatigue. Negative for activity change and unexpected weight change.  HENT: Negative for postnasal drip, rhinorrhea and sore throat.   Eyes: Positive for blurred vision. Negative for photophobia, pain, discharge, redness, itching and visual disturbance.  Respiratory: Positive for cough and shortness of breath. Negative for wheezing.        Intermittent wheezing.  Cardiovascular: Negative for chest pain and palpitations.       Cardioversion about 1 month ago.  Gastrointestinal: Negative for constipation, diarrhea, nausea and vomiting.       Decreased appetite  Endocrine: Negative for cold intolerance, heat intolerance, polydipsia and polyuria.       Patient with history of diabetes. Does not check blood sugars at home. Feels good, though. Admits he forgets to take both doses of medications some time. Knows that his blood sugars have been elevated .  Musculoskeletal: Positive for arthralgias, back pain and myalgias.  Skin: Negative for rash.  Allergic/Immunologic: Positive for environmental allergies.  Neurological: Positive for headaches.  Hematological: Negative for adenopathy.  Psychiatric/Behavioral: Positive for dysphoric mood. The patient is nervous/anxious.     Today's Vitals   12/04/18 1053  BP: (!) 103/56  Pulse: 78  Resp: 16  SpO2: 94%  Weight: 265 lb 6.4 oz (120.4 kg)  Height: 5' 10" (1.778 m)   Body mass index is 38.08 kg/m.  Physical Exam Vitals signs and nursing note reviewed.  Constitutional:      Appearance: Normal appearance. He is well-developed.  HENT:     Head: Normocephalic.     Nose: Nose normal.  Eyes:     Conjunctiva/sclera: Conjunctivae normal.     Pupils: Pupils are equal, round, and reactive to light.  Neck:     Musculoskeletal: Normal range of motion and neck supple.     Thyroid: No  thyromegaly.     Vascular: No carotid bruit or JVD.     Trachea: No tracheal deviation.  Cardiovascular:     Rate and Rhythm: Normal rate and regular rhythm. Frequent extrasystoles are present.    Heart sounds: Murmur present. Systolic murmur present with a grade of 2/6.     Comments: Irregular heart rhythm.  Pulmonary:     Effort: Pulmonary effort is normal. No accessory muscle usage or respiratory distress.     Breath sounds: Examination of the right-upper field reveals rhonchi. Examination of the left-upper field reveals rhonchi. Examination of the right-middle field reveals rhonchi. Examination of the left-middle field reveals rhonchi. Wheezing and rhonchi present.  Abdominal:     General: Bowel sounds are normal.     Palpations: Abdomen is soft.     Tenderness: There is no abdominal tenderness.  Musculoskeletal: Normal range of motion.  Lymphadenopathy:     Cervical: No cervical adenopathy.  Skin:    General: Skin is warm and dry.  Neurological:     General: No focal deficit present.     Mental Status: He is alert and oriented to person, place, and time.  Psychiatric:        Mood and Affect: Mood is anxious and depressed.        Speech: Speech normal.        Behavior: Behavior normal.        Thought Content: Thought content normal.  Judgment: Judgment normal.    Assessment/Plan:  1. Uncontrolled type 2 diabetes mellitus with hyperglycemia (HCC) - POCT HgB A1C 6.9 today. Continue diabetic medication as prescribed.   2. Atrial fibrillation, unspecified type [facility]) Continue regular visits with cardiology. Medications as prescribed.   3. Essential hypertension Blood pressure well controlled, running low-normal today. Continue medication as prescribed and follow up with cardiology as scheduled   4. Generalized anxiety disorder May continue totake trazodone 162m at bedtime as needed for anxiety/insomnia.  - traZODone (DESYREL) 100 MG tablet; Take 1 tablet (100 mg total)  by mouth at bedtime.  Dispense: 90 tablet; Refill: 1  5. Acne vulgaris Continue doxycycline 1050mdaily - doxycycline (VIBRA-TABS) 100 MG tablet; Take 1 tablet (100 mg total) by mouth daily. For acne.  Dispense: 90 tablet; Refill: 1  General Counseling: Miken verbalizes understanding of the findings of todays visit and agrees with plan of treatment. I have discussed any further diagnostic evaluation that may be needed or ordered today. We also reviewed his medications today. he has been encouraged to call the office with any questions or concerns that should arise related to todays visit.  Diabetes Counseling:  1. Addition of ACE inh/ ARB'S for nephroprotection. Microalbumin is updated  2. Diabetic foot care, prevention of complications. Podiatry consult 3. Exercise and lose weight.  4. Diabetic eye examination, Diabetic eye exam is updated  5. Monitor blood sugar closlely. nutrition counseling.  6. Sign and symptoms of hypoglycemia including shaking sweating,confusion and headaches.  This patient was seen by HeLeretha PolNP Collaboration with Dr FoLavera Guises a part of collaborative care agreement  Orders Placed This Encounter  Procedures  . POCT HgB A1C    Meds ordered this encounter  Medications  . traZODone (DESYREL) 100 MG tablet    Sig: Take 1 tablet (100 mg total) by mouth at bedtime.    Dispense:  90 tablet    Refill:  1    Please fill as 90 day prescription if possible. Thanks.    Order Specific Question:   Supervising Provider    Answer:   KHLavera Guise1[[id number]]. doxycycline (VIBRA-TABS) 100 MG tablet    Sig: Take 1 tablet (100 mg total) by mouth daily. For acne.    Dispense:  90 tablet    Refill:  1    Please fill as 90 day prescription if possible. Thanks    Order Specific Question:   Supervising Provider    Answer:   KHLavera Guise1[[id number]]  Time spent: 2567inutes      Dr FoLavera Guisenternal medicine

## 2018-12-05 DIAGNOSIS — L700 Acne vulgaris: Secondary | ICD-10-CM | POA: Insufficient documentation

## 2018-12-09 ENCOUNTER — Telehealth: Payer: Self-pay

## 2018-12-09 NOTE — Telephone Encounter (Signed)
TELEPHONE CALL NOTE  Lawrence Steele has been deemed a candidate for a follow-up tele-health visit to limit community exposure during the Covid-19 pandemic. I spoke with the patient via phone to ensure availability of phone/video source, confirm preferred email & phone number, discuss instructions and expectations, and review consent.   I reminded Lawrence Steele to be prepared with any vital sign and/or heart rhythm information that could potentially be obtained via home monitoring, at the time of his visit.  Finally, I reminded Lawrence Steele to expect an e-mail containing a link for their video-based visit approximately 15 minutes before his visit, or alternatively, a phone call at the time of his visit if his visit is planned to be a phone encounter.  Did the patient verbally consent to treatment as below? YES Lawrence Steele, [place] 12/09/2018 11:22 AM  CONSENT FOR TELE-HEALTH VISIT - PLEASE REVIEW  I hereby voluntarily request, consent and authorize [facility] and its employed or contracted physicians, physician assistants, nurse practitioners or other licensed health care professionals (the Practitioner), to provide me with telemedicine health care services (the "Services") as deemed necessary by the treating Practitioner. I acknowledge and consent to receive the Services by the Practitioner via telemedicine. I understand that the telemedicine visit will involve communicating with the Practitioner through live audiovisual communication technology and the disclosure of certain medical information by electronic transmission. I acknowledge that I have been given the opportunity to request an in-person assessment or other available alternative prior to the telemedicine visit and am voluntarily participating in the telemedicine visit.  I understand that I have the right to withhold or withdraw my consent to the use of telemedicine in the course of my care at any time, without affecting my right  to future care or treatment, and that the Practitioner or I may terminate the telemedicine visit at any time. I understand that I have the right to inspect all information obtained and/or recorded in the course of the telemedicine visit and may receive copies of available information for a reasonable fee.  I understand that some of the potential risks of receiving the Services via telemedicine include:  Marland Kitchen Delay or interruption in medical evaluation due to technological equipment failure or disruption; . Information transmitted may not be sufficient (e.g. poor resolution of images) to allow for appropriate medical decision making by the Practitioner; and/or  . In rare instances, security protocols could fail, causing a breach of personal health information.  Furthermore, I acknowledge that it is my responsibility to provide information about my medical history, conditions and care that is complete and accurate to the best of my ability. I acknowledge that Practitioner's advice, recommendations, and/or decision may be based on factors not within their control, such as incomplete or inaccurate data provided by me or distortions of diagnostic images or specimens that may result from electronic transmissions. I understand that the practice of medicine is not an exact science and that Practitioner makes no warranties or guarantees regarding treatment outcomes. I acknowledge that I will receive a copy of this consent concurrently upon execution via email to the email address I last provided but may also request a printed copy by calling the office of [place].    I understand that my insurance will be billed for this visit.   I have read or had this consent read to me. . I understand the contents of this consent, which adequately explains the benefits and risks of the Services being provided  via telemedicine.  . I have been provided ample opportunity to ask questions regarding this consent and the Services  and have had my questions answered to my satisfaction. . I give my informed consent for the services to be provided through the use of telemedicine in my medical care  By participating in this telemedicine visit I agree to the above.

## 2018-12-11 ENCOUNTER — Encounter: Payer: Self-pay | Admitting: Physician Assistant

## 2018-12-11 ENCOUNTER — Telehealth (INDEPENDENT_AMBULATORY_CARE_PROVIDER_SITE_OTHER): Payer: Medicare HMO | Admitting: Physician Assistant

## 2018-12-11 ENCOUNTER — Other Ambulatory Visit: Payer: Self-pay

## 2018-12-11 VITALS — BP 135/77 | HR 61 | Wt 262.6 lb

## 2018-12-11 DIAGNOSIS — I4892 Unspecified atrial flutter: Secondary | ICD-10-CM

## 2018-12-11 DIAGNOSIS — I110 Hypertensive heart disease with heart failure: Secondary | ICD-10-CM

## 2018-12-11 DIAGNOSIS — I10 Essential (primary) hypertension: Secondary | ICD-10-CM

## 2018-12-11 DIAGNOSIS — I5030 Unspecified diastolic (congestive) heart failure: Secondary | ICD-10-CM | POA: Diagnosis not present

## 2018-12-11 DIAGNOSIS — I714 Abdominal aortic aneurysm, without rupture, unspecified: Secondary | ICD-10-CM

## 2018-12-11 DIAGNOSIS — I5032 Chronic diastolic (congestive) heart failure: Secondary | ICD-10-CM

## 2018-12-11 DIAGNOSIS — Z9889 Other specified postprocedural states: Secondary | ICD-10-CM

## 2018-12-11 DIAGNOSIS — I2510 Atherosclerotic heart disease of native coronary artery without angina pectoris: Secondary | ICD-10-CM

## 2018-12-11 DIAGNOSIS — E785 Hyperlipidemia, unspecified: Secondary | ICD-10-CM | POA: Diagnosis not present

## 2018-12-11 DIAGNOSIS — I483 Typical atrial flutter: Secondary | ICD-10-CM

## 2018-12-11 NOTE — Patient Instructions (Addendum)
It was a pleasure to speak with you on the phone today! Thank you for allowing [place] to continue taking care of your [facility] needs during this time.   Medication Instructions:  Your physician has recommended you make the following change in your medication:  1- STOP Digoxin today  If you need a refill on your cardiac medications before your next appointment, please call your pharmacy.   Lab work: NONE ordered   If you have labs (blood work) drawn today and your tests are completely normal, you will receive your results only by: Marland Kitchen MyChart Message (if you have MyChart) OR . A paper copy in the mail If you have any lab test that is abnormal or we need to change your treatment, we will call you to review the results.  Testing/Procedures: NONE ordered   Follow-Up: At [facility], you and your health needs are our priority.  As part of our continuing mission to provide you with exceptional heart care, we have created designated Provider Care Teams.  These Care Teams include your primary Cardiologist (physician) and Advanced Practice Providers (APPs -  Physician Assistants and Nurse Practitioners) who all work together to provide you with the care you need, when you need it. Please keep your previously scheduled appointment with Dr. Caryl Comes on 01/22/19.  Any Other Special Instructions Will Be Listed Below (If Applicable). Please call the clinic if you gain 3 pounds or more overnight or 5 pounds in 1 week <or> if you have any other cardiac symptoms or shortness of breath.

## 2018-12-16 ENCOUNTER — Ambulatory Visit: Payer: Self-pay | Admitting: Physician Assistant

## 2018-12-22 ENCOUNTER — Other Ambulatory Visit: Payer: Self-pay | Admitting: Cardiovascular Disease

## 2018-12-22 MED ORDER — FUROSEMIDE 20 MG PO TABS: 20.0000 mg | ORAL_TABLET | Freq: Every day | ORAL | 1 refills | Status: DC

## 2018-12-22 NOTE — Telephone Encounter (Signed)
°*  STAT* If patient is at the pharmacy, call can be transferred to refill team.   1. Which medications need to be refilled? (please list name of each medication and dose if known) furosemide (LASIX) 20 MG 2 times daily (swelling has been bad)  2. Which pharmacy/location (including street and city if local pharmacy) is medication to be sent to? [facility] on [address]   3. Do they need a 30 day or 90 day supply? 90 day

## 2018-12-28 ENCOUNTER — Other Ambulatory Visit: Payer: Self-pay | Admitting: Adult Health

## 2018-12-29 ENCOUNTER — Other Ambulatory Visit: Payer: Self-pay

## 2018-12-29 MED ORDER — ALLOPURINOL 100 MG PO TABS: 100.0000 mg | ORAL_TABLET | Freq: Two times a day (BID) | ORAL | 0 refills | Status: DC

## 2018-12-29 MED ORDER — APIXABAN 5 MG PO TABS: 5.0000 mg | ORAL_TABLET | Freq: Two times a day (BID) | ORAL | 3 refills | Status: DC

## 2018-12-29 MED ORDER — CYCLOBENZAPRINE HCL 10 MG PO TABS: 10.0000 mg | ORAL_TABLET | Freq: Two times a day (BID) | ORAL | 2 refills | Status: DC

## 2018-12-29 NOTE — Telephone Encounter (Signed)
*  STAT* If patient is at the pharmacy, call can be transferred to refill team.   1. Which medications need to be refilled? (please list name of each medication and dose if known) Eliquis  2. Which pharmacy/location (including street and city if local pharmacy) is medication to be sent to?  [facility] [place] 3. Do they need a 30 day or 90 day supply? [employer]

## 2018-12-29 NOTE — Telephone Encounter (Signed)
Pt's age 68, wt 119.1 kg, serum creatinine 0.85 on 11/21/18, CrCl 142.06, last ov w/ Christell Faith, PA on 12/11/18. Refill sent in as requested.

## 2018-12-29 NOTE — Telephone Encounter (Signed)
Refill request

## 2018-12-30 ENCOUNTER — Other Ambulatory Visit: Payer: Self-pay | Admitting: *Deleted

## 2018-12-30 NOTE — Patient Outreach (Signed)
[facility]) Care Management  12/30/2018   Lawrence Steele 1951/09/10 [id number]  RN Health Coach telephone call to patient.  [employer] compliance verified. Per patient his fasting blood sugar this morning was 132.  Per patient his A1C is 6.9. Patient states that he exercises doing leg lifts that PT had taught him 10-20 reps but not on a daily basis. Per patient he has bot has any recent falls, Per patient he is now taking his medications as per ordered on a daily basis. Patient has agreed to further outreach calls. RN discussed with patient about diabetic foot care.  Current Medications:  Current Outpatient Medications  Medication Sig Dispense Refill  . Accu-Chek Softclix Lancets lancets Use as instructed  Once a daily E11.65 100 each 1  . acetaminophen (TYLENOL) 500 MG tablet Take 500 mg by mouth every 6 (six) hours as needed.     Marland Kitchen albuterol (PROAIR HFA) 108 (90 Base) MCG/ACT inhaler Inhale 2 puffs into the lungs every 6 (six) hours as needed. 3 Inhaler 3  . allopurinol (ZYLOPRIM) 100 MG tablet Take 1 tablet (100 mg total) by mouth 2 (two) times daily. 180 tablet 0  . apixaban (ELIQUIS) 5 MG TABS tablet Take 1 tablet (5 mg total) by mouth 2 (two) times daily. 60 tablet 3  . atenolol (TENORMIN) 50 MG tablet Take 1 tablet (50 mg total) by mouth daily. 90 tablet 3  . atorvastatin (LIPITOR) 20 MG tablet Take 1 tablet (20 mg total) by mouth daily. 30 tablet 11  . bisacodyl (DULCOLAX) 5 MG EC tablet Take 1 tablet (5 mg total) by mouth daily as needed for moderate constipation. 30 tablet 0  . Blood Glucose Monitoring Suppl (ACCU-CHEK AVIVA PLUS) w/Device KIT Use once daily to check blood sugar. 1 kit 3  . Blood Glucose Monitoring Suppl (ACCU-CHEK AVIVA PLUS) w/Device KIT Use as directed 1 kit 0  . cyclobenzaprine (FLEXERIL) 10 MG tablet Take 1 tablet (10 mg total) by mouth 2 (two) times daily at 10 AM and 5 PM. 60 tablet 2  . dicyclomine (BENTYL) 10 MG capsule Take 1 capsule (10 mg  total) by mouth 4 (four) times daily -  before meals and at bedtime. 45 capsule 3  . escitalopram (LEXAPRO) 5 MG tablet Take 1 tablet (5 mg total) by mouth daily. 30 tablet 3  . furosemide (LASIX) 20 MG tablet Take 1 tablet (20 mg total) by mouth daily. 90 tablet 1  . gabapentin (NEURONTIN) 400 MG capsule Take 400 mg by mouth 3 (three) times daily.     Marland Kitchen glimepiride (AMARYL) 1 MG tablet Take 2 tablets (2 mg total) by mouth daily with supper. (Patient taking differently: Take 2 mg by mouth 2 (two) times daily with a meal. ) 180 tablet 1  . glucose blood (ACCU-CHEK AVIVA PLUS) test strip 1 each by Other route as needed for other. Use as instructed 100 each 3  . glucose blood (ACCU-CHEK AVIVA PLUS) test strip Pt needs accu check Aviva plus lancets, test strips and meter to check blood sugars once daily 100 each 12  . HYDROcodone-acetaminophen (NORCO) 5-325 MG tablet Take 1 tablet by mouth 2 (two) times daily.     . hydroxypropyl methylcellulose / hypromellose (ISOPTO TEARS / GONIOVISC) 2.5 % ophthalmic solution Place 1 drop into both eyes daily as needed for dry eyes.    Marland Kitchen ipratropium (ATROVENT) 0.02 % nebulizer solution Use every 6 hours for Shortness of breath and wheezing and as needed.  Prescription  is for 90 days 900 mL 1  . isosorbide mononitrate (IMDUR) 60 MG 24 hr tablet Take 1 tablet (60 mg total) by mouth daily. 90 tablet 1  . metFORMIN (GLUCOPHAGE) 500 MG tablet Take 1 tablet (500 mg total) by mouth 2 (two) times daily with a meal. 180 tablet 1  . nitroGLYCERIN (NITROSTAT) 0.4 MG SL tablet Place 0.4 mg under the tongue.    . OXYGEN Inhale 2 L into the lungs continuous.     Marland Kitchen SPIRIVA HANDIHALER 18 MCG inhalation capsule Inhale 1 puff by mouth once daily 30 capsule 0  . traZODone (DESYREL) 100 MG tablet Take 1 tablet (100 mg total) by mouth at bedtime. 90 tablet 1  . umeclidinium bromide (INCRUSE ELLIPTA) 62.5 MCG/INH AEPB Inhale 1 puff into the lungs daily. 3 each 3   No current  facility-administered medications for this visit.     Functional Status:  In your present state of health, do you have any difficulty performing the following activities: 12/30/2018 11/24/2018  Hearing? N N  Vision? N N  Difficulty concentrating or making decisions? N N  Walking or climbing stairs? Y N  Comment - -  Dressing or bathing? N N  Doing errands, shopping? [place] and eating ? N -  Using the Toilet? N -  In the past six months, have you accidently leaked urine? N -  Do you have problems with loss of bowel control? N -  Managing your Medications? N -  Managing your Finances? Y -  [place] or managing your Housekeeping? Y -  Comment - -  Some recent data might be hidden    Fall/Depression Screening: Fall Risk  12/30/2018 12/04/2018 10/31/2018  Falls in the past year? 1 0 1  Comment - - Per patient his balance is off and he has fallen  Number falls in past yr: 0 - 0  Injury with Fall? 1 - 1  Comment - - bruised hi sarm  Risk for fall due to : History of fall(s);Impaired balance/gait;Impaired mobility - History of fall(s);Impaired balance/gait;Impaired mobility  Follow up Falls evaluation completed - -   PHQ 2/9 Scores 12/30/2018 10/31/2018 09/30/2018 09/11/2018 07/22/2018 07/09/2018 06/19/2018  PHQ - 2 Score 0 0 0 0 0 0 0  PHQ- 9 Score - - - - - - -   THN CM Care Plan Problem One     Most Recent Value  Care Plan Problem One  Knowledge Deficit in Self Management of Diabetes  Care Plan for Problem One  Active  THN Long Term Goal   Patient will  see a decrease in his A1C from 7.2 within the next 90 days within the next 90 days  THN Long Term Goal Met Date  12/30/18  [facility] CM Short Term Goal #1   Patient will try to exercise on a regular basis within the next 30 days  THN CM Short Term Goal #1 Start Date  12/30/18  Interventions for Short Term Goal #1  RN discussed exercising on a regular basis. RN discussed the benefits of daily  exercisng and helping with mobility. RN will follow up for compliance  THN CM Short Term Goal #2   Patient will be able to identify foods that make up a healthy snack within the next 30 days  THN CM Short Term Goal #2 Start Date  12/30/18  Interventions for Short Term Goal #2  RN reiterated patient making healthy  snacks that was affordable. RN will follow up with patient food choices.      Assessment:  Fasting blood sugar today is 132 A1C 6.9 Patient is doing random exercises Patient has adequate food supply during the pandemic Patient has medication during pandemic  Plan:  RN discussed medication adherence Patient will check blood sugars as per physician order RN discussed A1C progress RN discussed healthy eating RN dicussed the benefits of exercise RN will follow up within the month of July  Jaydi Bray [facility] [phone].

## 2019-01-05 ENCOUNTER — Telehealth: Payer: Self-pay | Admitting: Internal Medicine

## 2019-01-05 NOTE — Telephone Encounter (Signed)
Virtual Visit Pre-Appointment Phone Call  Steps For Call:  1. Confirm consent - "In the setting of the current Covid19 crisis, you are scheduled for a (phone or video) visit with your provider on (date) at (time).  Just as we do with many in-office visits, in order for you to participate in this visit, we must obtain consent.  If you'd like, I can send this to your mychart (if signed up) or email for you to review.  Otherwise, I can obtain your verbal consent now.  All virtual visits are billed to your insurance company just like a normal visit would be.  By agreeing to a virtual visit, we'd like you to understand that the technology does not allow for your provider to perform an examination, and thus may limit your provider's ability to fully assess your condition. If your provider identifies any concerns that need to be evaluated in person, we will make arrangements to do so.  Finally, though the technology is pretty good, we cannot assure that it will always work on either your or our end, and in the setting of a video visit, we may have to convert it to a phone-only visit.  In either situation, we cannot ensure that we have a secure connection.  Are you willing to proceed?" STAFF: Did the patient verbally acknowledge consent to telehealth visit? Document YES/NO here: YES  2. Confirm the BEST phone number to call the day of the visit by including in appointment notes  3. Give patient instructions for MyChart download to smartphone OR Doximity/[email or web address].me as below if video visit (depending on what platform provider is using)  4. Confirm that appointment type is correct in Epic appointment notes (VIDEO vs PHONE)  5. Advise patient to be prepared with their blood pressure, heart rate, weight, any heart rhythm information, their current medicines, and a piece of paper and pen handy for any instructions they may receive the day of their visit  6. Inform patient they will receive a phone call 15 minutes  prior to their appointment time (may be from unknown caller ID) so they should be prepared to answer    Lawrence Steele has been deemed a candidate for a follow-up tele-health visit to limit community exposure during the Covid-19 pandemic. I spoke with the patient via phone to ensure availability of phone/video source, confirm preferred email & phone number, and discuss instructions and expectations.  I reminded Lawrence Steele to be prepared with any vital sign and/or heart rhythm information that could potentially be obtained via home monitoring, at the time of his visit. I reminded Lawrence Steele to expect a phone call prior to his visit.  Clarisse Gouge 01/05/2019 4:56 PM   INSTRUCTIONS FOR DOWNLOADING THE MYCHART APP TO SMARTPHONE  - The patient must first make sure to have activated MyChart and know their login information - If Apple, go to [employer] and type in MyChart in the search bar and download the app. If Android, ask patient to go to [employer] and type in [place] in the search bar and download the app. The app is free but as with any other app downloads, their phone may require them to verify saved payment information or Apple/Android password.  - The patient will need to then log into the app with their MyChart username and password, and select [facility] as their healthcare provider to link the account. When it is time for your visit,  go to the MyChart app, find appointments, and click Begin Video Visit. Be sure to Select Allow for your device to access the Microphone and Camera for your visit. You will then be connected, and your provider will be with you shortly.  **If they have any issues connecting, or need assistance please contact MyChart service desk [phone])**  **If using a computer, in order to ensure the best quality for their visit they will need to use either of the following Internet Browsers: [employer], or  Google Chrome**  IF USING DOXIMITY or DOXY.ME - The patient will receive a link just prior to their visit by text.     FULL LENGTH CONSENT FOR TELE-HEALTH VISIT   I hereby voluntarily request, consent and authorize [facility] and its employed or contracted physicians, physician assistants, nurse practitioners or other licensed health care professionals (the Practitioner), to provide me with telemedicine health care services (the Services") as deemed necessary by the treating Practitioner. I acknowledge and consent to receive the Services by the Practitioner via telemedicine. I understand that the telemedicine visit will involve communicating with the Practitioner through live audiovisual communication technology and the disclosure of certain medical information by electronic transmission. I acknowledge that I have been given the opportunity to request an in-person assessment or other available alternative prior to the telemedicine visit and am voluntarily participating in the telemedicine visit.  I understand that I have the right to withhold or withdraw my consent to the use of telemedicine in the course of my care at any time, without affecting my right to future care or treatment, and that the Practitioner or I may terminate the telemedicine visit at any time. I understand that I have the right to inspect all information obtained and/or recorded in the course of the telemedicine visit and may receive copies of available information for a reasonable fee.  I understand that some of the potential risks of receiving the Services via telemedicine include:   Delay or interruption in medical evaluation due to technological equipment failure or disruption;  Information transmitted may not be sufficient (e.g. poor resolution of images) to allow for appropriate medical decision making by the Practitioner; and/or   In rare instances, security protocols could fail, causing a breach of personal health  information.  Furthermore, I acknowledge that it is my responsibility to provide information about my medical history, conditions and care that is complete and accurate to the best of my ability. I acknowledge that Practitioner's advice, recommendations, and/or decision may be based on factors not within their control, such as incomplete or inaccurate data provided by me or distortions of diagnostic images or specimens that may result from electronic transmissions. I understand that the practice of medicine is not an exact science and that Practitioner makes no warranties or guarantees regarding treatment outcomes. I acknowledge that I will receive a copy of this consent concurrently upon execution via email to the email address I last provided but may also request a printed copy by calling the office of [facility].    I understand that my insurance will be billed for this visit.   I have read or had this consent read to me.  I understand the contents of this consent, which adequately explains the benefits and risks of the Services being provided via telemedicine.   I have been provided ample opportunity to ask questions regarding this consent and the Services and have had my questions answered to my satisfaction.  I give my informed  consent for the services to be provided through the use of telemedicine in my medical care  By participating in this telemedicine visit I agree to the above.

## 2019-01-12 ENCOUNTER — Telehealth: Payer: Self-pay | Admitting: Cardiovascular Disease

## 2019-01-12 DIAGNOSIS — Z79899 Other long term (current) drug therapy: Secondary | ICD-10-CM

## 2019-01-12 DIAGNOSIS — I5032 Chronic diastolic (congestive) heart failure: Secondary | ICD-10-CM

## 2019-01-12 NOTE — Telephone Encounter (Signed)
Agree with recommendation for patient to wear compression stockings, elevate legs and limit sodium intake. He has also forgotten to take his Lasix at times, which may be playing a role in his symptoms if he is continuing to miss doses.   Please have him take Lasix 40 mg daily alternating with 20 mg daily in an effort to improve diuresis. He will need follow up BMET after being on increased dose of Lasix x 1 week to ensure stable renal function.

## 2019-01-12 NOTE — Telephone Encounter (Signed)
Pt c/o medication issue:  1. Name of Medication: furosemide (LASIX)   2. How are you currently taking this medication (dosage and times per day)? 20 MG 1 tablet daily   3. Are you having a reaction (difficulty breathing--STAT)? No  4. What is your medication issue? Patient calling, states that the swelling in his legs does not go down and wants to clarify with nurse that it is ok to take more than 1 if needed.  Please call to discuss.

## 2019-01-12 NOTE — Telephone Encounter (Signed)
Spoke with patient and he states that since decreasing fluid pill down to one daily he has noticed more swelling to his legs. He wanted to know if he could go back to two pills daily. Reviewed importance of compression socks, leg elevation, sodium restriction, and decrease fluid intake. He reports weight was 267 and now it is 271. Advised that I would send this to provider for his review and recommendations and we would give him a call back. He verbalized understanding of our conversation, agreement with plan, and had no further questions at this time.

## 2019-01-13 MED ORDER — FUROSEMIDE 20 MG PO TABS: ORAL_TABLET | ORAL | 3 refills | Status: DC

## 2019-01-13 NOTE — Telephone Encounter (Signed)
Patient verbalized understanding of Ryan's recommendations. He states he gets "Meals on Wheels" and is not sure how much sodium is in them. He does do his best to limit sodium and elevate his legs.   He does not drive but will have his daughter bring him to the [facility] next Tuesday for lab work. He also verbalized understanding to take furosemide 40 mg daily alternating with 20 mg daily.  New Rx sent to pharmacy.

## 2019-01-21 ENCOUNTER — Telehealth: Payer: Self-pay

## 2019-01-22 ENCOUNTER — Telehealth (INDEPENDENT_AMBULATORY_CARE_PROVIDER_SITE_OTHER): Payer: Medicare HMO | Admitting: Internal Medicine

## 2019-01-22 ENCOUNTER — Telehealth: Payer: Self-pay | Admitting: Internal Medicine

## 2019-01-22 ENCOUNTER — Other Ambulatory Visit: Payer: Self-pay

## 2019-01-22 ENCOUNTER — Other Ambulatory Visit: Payer: Self-pay | Admitting: Nurse Practitioner

## 2019-01-22 VITALS — BP 118/68 | HR 78 | Ht 69.0 in | Wt 267.0 lb

## 2019-01-22 DIAGNOSIS — I714 Abdominal aortic aneurysm, without rupture: Secondary | ICD-10-CM | POA: Diagnosis not present

## 2019-01-22 DIAGNOSIS — R42 Dizziness and giddiness: Secondary | ICD-10-CM

## 2019-01-22 DIAGNOSIS — R2689 Other abnormalities of gait and mobility: Secondary | ICD-10-CM | POA: Diagnosis not present

## 2019-01-22 DIAGNOSIS — K5792 Diverticulitis of intestine, part unspecified, without perforation or abscess without bleeding: Secondary | ICD-10-CM

## 2019-01-22 DIAGNOSIS — Z79891 Long term (current) use of opiate analgesic: Secondary | ICD-10-CM

## 2019-01-22 DIAGNOSIS — G8929 Other chronic pain: Secondary | ICD-10-CM

## 2019-01-22 DIAGNOSIS — R2681 Unsteadiness on feet: Secondary | ICD-10-CM

## 2019-01-22 DIAGNOSIS — Z9981 Dependence on supplemental oxygen: Secondary | ICD-10-CM | POA: Diagnosis not present

## 2019-01-22 DIAGNOSIS — I4892 Unspecified atrial flutter: Secondary | ICD-10-CM | POA: Diagnosis not present

## 2019-01-22 DIAGNOSIS — J449 Chronic obstructive pulmonary disease, unspecified: Secondary | ICD-10-CM | POA: Diagnosis not present

## 2019-01-22 DIAGNOSIS — I483 Typical atrial flutter: Secondary | ICD-10-CM

## 2019-01-22 MED ORDER — METRONIDAZOLE 500 MG PO TABS: 500.0000 mg | ORAL_TABLET | Freq: Two times a day (BID) | ORAL | 0 refills | Status: DC

## 2019-01-22 MED ORDER — CIPROFLOXACIN HCL 500 MG PO TABS: 500.0000 mg | ORAL_TABLET | Freq: Two times a day (BID) | ORAL | 0 refills | Status: DC

## 2019-01-22 NOTE — Patient Instructions (Signed)
Medication Instructions:  - Your physician recommends that you continue on your current medications as directed. Please refer to the Current Medication list given to you today.  If you need a refill on your cardiac medications before your next appointment, please call your pharmacy.   Lab work: - none ordered  If you have labs (blood work) drawn today and your tests are completely normal, you will receive your results only by: Marland Kitchen MyChart Message (if you have MyChart) OR . A paper copy in the mail If you have any lab test that is abnormal or we need to change your treatment, we will call you to review the results.  Testing/Procedures: - Your physician has recommended an MRI of your head in about 6-8 weeks (gait instability), we will schedule this at your next visit with Dr. Caryl Comes  Follow-Up: At [facility], you and your health needs are our priority.  As part of our continuing mission to provide you with exceptional heart care, we have created designated Provider Care Teams.  These Care Teams include your primary Cardiologist (physician) and Advanced Practice Providers (APPs -  Physician Assistants and Nurse Practitioners) who all work together to provide you with the care you need, when you need it. . in 4 weeks with Dr. Caryl Comes  Any Other Special Instructions Will Be Listed Below (If Applicable).  - Please obtain orthostatic vital signs today

## 2019-01-22 NOTE — Telephone Encounter (Signed)
Attempted to reach the patient regarding his AVS instructions from his e-visit with Dr. Caryl Comes today.  I left a message to please call back at his home & cell # on file.

## 2019-01-22 NOTE — Telephone Encounter (Signed)
Due to possible diverticulitis, sent in prescriptions for ciprofloxacin 500mg  and metronidazole 500mg , bith twice daily for 10 days. Sent to [facility] [place].

## 2019-01-22 NOTE — Telephone Encounter (Signed)
Pt advised we send to [facility]  

## 2019-01-22 NOTE — Progress Notes (Signed)
Due to possible diverticulitis, sent in prescriptions for ciprofloxacin 500mg  and metronidazole 500mg , bith twice daily for 10 days. Sent to [facility] [place].

## 2019-01-22 NOTE — Telephone Encounter (Signed)
I spoke with the patient to follow up on his orthostatic BP (HR) readings Dr. Caryl Comes wanted him to obtain today after his E-visit. Per the patient, he checked these sitting & standing several times.   Sitting- 131/64 (83) Standing: 113/69 (90) Sitting- 128/61 (81) Standing- 103/63 (88) Sitting- 120/65  Standing- 103/60 Sitting- 122/62  Standing- 109/66 Sitting- 123/60  Standing- 101/57 Sitting- 118/64  Standing- 99/60 Sitting- 119/59  Standing- 103/58 Sitting- 122/62  Standing- 91/55 Sitting- 122/59  Standing- 95/58  The patient states his HR's did not vary by more than 4-5 points from sitting to standing.  He states his PCP has already told him to stand a few minutes before walking around. He states he does not always feel dizzy when changing position and did not feel this way today.  I advised I would forward his readings to Dr. Caryl Comes and call him back with any further recommendations.  The patient voices understanding and is agreeable.  Follow up appt scheduled for 02/17/19 at 8:15 am with Dr. Caryl Comes.

## 2019-01-22 NOTE — Progress Notes (Signed)
Electrophysiology TeleHealth Note   Due to national recommendations of social distancing due to COVID 19, an audio/video telehealth visit is felt to be most appropriate for this patient at this time.  See MyChart message from today for the patient's consent to telehealth for Lawrence Steele.   Date:  01/22/2019   ID:  Lawrence Steele, DOB [birth year], MRN [id number]  Location: patient's home  Provider location: [address] [place]  Evaluation Performed: Initial Evaluation  PCP:  Ronnell Freshwater, NP  Cardiologist:  Ida Rogue, MD   Electrophysiologist:  None   Chief Complaint: atrial flutter  History of Present Illness:    Lawrence Steele is a 68 y.o. male who presents via audio/video conferencing for a telehealth visit today for  Atrial flutter. The patient did not have access to video technology/had technical difficulties with video requiring transitioning to audio format only (telephone).  All issues noted in this document were discussed and addressed.  No physical exam could be performed with this format.            Recurrent tachypalpitations, assoc SOB, LH, without chestpain  At baseline, he is functionally quite limited by dyspnea and back and hip pain.  Does not have exertional chest discomfort.     Rx w apixoban   Some gait instability; describes himself as walking like he is drunk.  Orthostatic LH with presyncope/ also fall.    No longer driving 2/2 cough syncope.quiescent for few years, followed by pulm.    COPD dependent O2 with DOE   Hx of aneurysmal disease, AAA-- Aorta    DATE TEST EF   12/15 LHC  51 % CA Nonobstructive  2/18 Myoview 62% Non Ischemic  3/20 Echo  60-65% LA  (42/1.7/24)   Date Cr K Hgb  3/20 0.85 4.2 14.3            ECG 7/19 Atrial flutter typical 2:1 block >>DCCV   ECG  3/19 Atrial flutter typical 2:1 AV block >> DCCV  Thromboembolic risk factors ( age -27  HTN-1 ,   DM-1 ) for a CHADSVASc Score of 3  Hx  of bipolar disorder, EtOH abuse-- much less now-- occasional beer.   The patient denies symptoms of fevers, chills, cough, or new SOB worrisome for COVID 19.    Past Medical History:  Diagnosis Date   AAA (abdominal aortic aneurysm) ([place])    a.  Status post endovascular repair at [facility] in 2016   Anxiety    Asthma    Atrial flutter ([place])    a. diagnosed 7/19; b. CHADS2VASc => 5 (CHF, HTN, age x 1, DM, vascular disease); c. on Eliquis; d. s/p TEE/DCCV 8/19 with repeat DCCV 12/19   Chronic diastolic CHF (congestive heart failure) ([place])    COPD (chronic obstructive pulmonary disease) ([place])    Coronary artery disease    a. LHC 5/10: left main 10%, mLAD-1 lesion 20%, mLAD-2 lesion 20%, D1 20%, mLCx-1 lesion 30%, mLCx-2 lesion 50%, OM1 20%, OM to 20%, LPL 20%, pRCA-1 lesion 20%, pRCA-2 lesion 20%.  EF 51% w/ mild inf HK   Diabetes mellitus with complication (HCC)    Hyperlipidemia    Hypertension    Iliac artery aneurysm, bilateral ([place])    Obstructive sleep apnea-hypopnea syndrome     Past Surgical History:  Procedure Laterality Date   ABDOMINAL AORTIC ANEURYSM REPAIR  03/06/2015   [place]  CARDIAC CATHETERIZATION  2010,03/16/2003,11/04/2002   CARDIOVERSION N/A 04/24/2018   Procedure: CARDIOVERSION;  Surgeon: Nelva Bush, MD;  Location: [facility] ORS;  Service: Cardiovascular;  Laterality: N/A;   CARDIOVERSION N/A 09/05/2018   Procedure: CARDIOVERSION;  Surgeon: Minna Merritts, MD;  Location: [facility] ORS;  Service: Cardiovascular;  Laterality: N/A;   CARDIOVERSION N/A 11/24/2018   Procedure: CARDIOVERSION (CATH LAB);  Surgeon: Wellington Hampshire, MD;  Location: [facility] ORS;  Service: Cardiovascular;  Laterality: N/A;   CORONARY ANGIOPLASTY  03/16/2003   stent to the RCA  & 2/18/2004stent mid circumflex   ELBOW SURGERY     HERNIA REPAIR     NASAL SINUS SURGERY     PENILE PROSTHESIS IMPLANT  1995   PILONIDAL CYST EXCISION      TEE WITHOUT CARDIOVERSION N/A 04/24/2018   Procedure: TRANSESOPHAGEAL ECHOCARDIOGRAM (TEE);  Surgeon: Nelva Bush, MD;  Location: [facility] ORS;  Service: Cardiovascular;  Laterality: N/A;   TONSILECTOMY, ADENOIDECTOMY, BILATERAL MYRINGOTOMY AND TUBES     UMBILICAL HERNIA REPAIR      Current Outpatient Medications  Medication Sig Dispense Refill   Accu-Chek Softclix Lancets lancets Use as instructed  Once a daily E11.65 100 each 1   acetaminophen (TYLENOL) 500 MG tablet Take 500 mg by mouth every 6 (six) hours as needed.      albuterol (PROAIR HFA) 108 (90 Base) MCG/ACT inhaler Inhale 2 puffs into the lungs every 6 (six) hours as needed. 3 Inhaler 3   allopurinol (ZYLOPRIM) 100 MG tablet Take 1 tablet (100 mg total) by mouth 2 (two) times daily. 180 tablet 0   apixaban (ELIQUIS) 5 MG TABS tablet Take 1 tablet (5 mg total) by mouth 2 (two) times daily. 60 tablet 3   atenolol (TENORMIN) 50 MG tablet Take 1 tablet (50 mg total) by mouth daily. 90 tablet 3   atorvastatin (LIPITOR) 20 MG tablet Take 1 tablet (20 mg total) by mouth daily. 30 tablet 11   bisacodyl (DULCOLAX) 5 MG EC tablet Take 1 tablet (5 mg total) by mouth daily as needed for moderate constipation. 30 tablet 0   Blood Glucose Monitoring Suppl (ACCU-CHEK AVIVA PLUS) w/Device KIT Use once daily to check blood sugar. 1 kit 3   Blood Glucose Monitoring Suppl (ACCU-CHEK AVIVA PLUS) w/Device KIT Use as directed 1 kit 0   cyclobenzaprine (FLEXERIL) 10 MG tablet Take 1 tablet (10 mg total) by mouth 2 (two) times daily at 10 AM and 5 PM. 60 tablet 2   dicyclomine (BENTYL) 10 MG capsule Take 1 capsule (10 mg total) by mouth 4 (four) times daily -  before meals and at bedtime. 45 capsule 3   escitalopram (LEXAPRO) 5 MG tablet Take 1 tablet (5 mg total) by mouth daily. 30 tablet 3   furosemide (LASIX) 20 MG tablet Take 2 tablets (40 mg) by mouth daily alternating with 1 tablet (20 mg) by mouth daily. (Patient taking differently:  daily. ) 135 tablet 3   gabapentin (NEURONTIN) 400 MG capsule Take 400 mg by mouth 3 (three) times daily.      glimepiride (AMARYL) 1 MG tablet Take 2 tablets (2 mg total) by mouth daily with supper. 180 tablet 1   glucose blood (ACCU-CHEK AVIVA PLUS) test strip 1 each by Other route as needed for other. Use as instructed 100 each 3   glucose blood (ACCU-CHEK AVIVA PLUS) test strip Pt needs accu check Aviva plus lancets, test strips and meter to check blood sugars once daily 100 each 12  HYDROcodone-acetaminophen (NORCO) 5-325 MG tablet Take 1 tablet by mouth 3 (three) times daily.      hydroxypropyl methylcellulose / hypromellose (ISOPTO TEARS / GONIOVISC) 2.5 % ophthalmic solution Place 1 drop into both eyes daily as needed for dry eyes.     ipratropium (ATROVENT) 0.02 % nebulizer solution Use every 6 hours for Shortness of breath and wheezing and as needed.  Prescription is for 90 days 900 mL 1   isosorbide mononitrate (IMDUR) 60 MG 24 hr tablet Take 1 tablet (60 mg total) by mouth daily. 90 tablet 1   metFORMIN (GLUCOPHAGE) 500 MG tablet Take 1 tablet (500 mg total) by mouth 2 (two) times daily with a meal. 180 tablet 1   nitroGLYCERIN (NITROSTAT) 0.4 MG SL tablet Place 0.4 mg under the tongue.     OXYGEN Inhale 2 L into the lungs continuous.      SPIRIVA HANDIHALER 18 MCG inhalation capsule Inhale 1 puff by mouth once daily 30 capsule 0   traZODone (DESYREL) 100 MG tablet Take 1 tablet (100 mg total) by mouth at bedtime. 90 tablet 1   umeclidinium bromide (INCRUSE ELLIPTA) 62.5 MCG/INH AEPB Inhale 1 puff into the lungs daily. 3 each 3   ciprofloxacin (CIPRO) 500 MG tablet Take 1 tablet (500 mg total) by mouth 2 (two) times daily. (Patient not taking: Reported on 01/22/2019) 20 tablet 0   metroNIDAZOLE (FLAGYL) 500 MG tablet Take 1 tablet (500 mg total) by mouth 2 (two) times daily. (Patient not taking: Reported on 01/22/2019) 20 tablet 0   No current facility-administered  medications for this visit.     Allergies:   Codeine   Social History:  The patient  reports that he has been smoking cigarettes. He has a 11.25 pack-year smoking history. He has quit using smokeless tobacco.  His smokeless tobacco use included chew. He reports previous alcohol use. He reports that he does not use drugs.   Family History:  The patient's   family history includes Depression in his mother; Heart attack (age of onset: 51) in his father; Heart disease in his father; Hyperlipidemia in his father and mother; Hypertension in his father and mother.   ROS:  Please see the history of present illness.   All other systems are personally reviewed and negative.    Exam:    Vital Signs:  BP 118/68 (BP Location: Left Arm, Patient Position: Sitting, Cuff Size: Normal)    Pulse 78    Ht _0  (1.753 m)    Wt 267 lb (121.1 kg)    SpO2 92%    BMI 39.43 kg/m     Well appearing, alert and conversant, regular work of breathing,  good skin color Eyes- anicteric, neuro- grossly intact, skin- no apparent rash or lesions or cyanosis, mouth- oral mucosa is pink   Labs/Other Tests and Data Reviewed:    Recent Labs: 09/19/2018: ALT 19 11/21/2018: BUN 10; Creatinine, Ser 0.85; Hemoglobin 14.3; Platelets 161; Potassium 4.2; Sodium 140; TSH 0.924  Personally reviewed     Wt Readings from Last 3 Encounters:  01/22/19 267 lb (121.1 kg)  12/11/18 262 lb 9.6 oz (119.1 kg)  12/04/18 265 lb 6.4 oz (120.4 kg)     Other studies personally reviewed: Additional studies/ records that were reviewed today include:As above  Review of the above records today demonstrates: As above     ASSESSMENT & PLAN:   Atrial flutter recurrent  COPD oxygen dependent  Abdominal and Aortic aneurysmal disease  Morbidly obese  Quinolone therapy previous  Pain-chronic-opiate dependence  Gait instability  Orthostatic intolerance    The patient has had recurrent atrial flutter.  Is appropriate to consider  catheter ablation to prevent recurrences.  We have reviewed the risks and benefits including but not limited to perforation vascular injury.  His gait instability has a broad differential including neurological problems related to prior alcohol use, possible stroke, diabetic neuropathy.  Whether he is had a stroke would inform recommendations regarding anticoagulation post ablation.  Hence, will anticipate MRI to help make that decision.  We will have him do orthostatic vital signs today; in addition to his orthostatic lightheadedness this may be contributing to gait instability.  Have encouraged him to continue to walk with efforts at weight loss.  Alcohol intake is significantly decreased by his history  He is advised to avoid quinolone therapy with his history of aneurysmal disease  We will add quinolones to drug intolerance list  COVID 19 screen The patient denies symptoms of COVID 19 at this time.  The importance of social distancing was discussed today.  Follow-up:  4 weeks     Current medicines are reviewed at length with the patient today.   The patient does not have concerns regarding his medicines.  The following changes were made today:  none  Labs/ tests ordered today include:   No orders of the defined types were placed in this encounter.   Future tests ( post COVID )  Head MRI in about 6 -8 weeks to look for stroke given gait instability  Patient Risk:  after full review of this patients clinical status, I feel that they are at moderate risk at this time.  Today, I have spent  18  minutes with the patient with telehealth technology discussing As above  .    Signed, [name], MD  01/22/2019 9:56 AM     [facility] [address] [place] [address] [phone] (office) [phone] (fax)

## 2019-01-27 NOTE — Progress Notes (Signed)
78  thx

## 2019-01-28 NOTE — Telephone Encounter (Signed)
Given his anuerymal disease which would benefit from BB and his orthostasis which may be aggravated by Nitrates Lets try two things 1) nitrates at night 2) abdominal binder

## 2019-01-28 NOTE — Telephone Encounter (Signed)
Attempted to call the patient with MD recommendations. Call rings once then drops x 2 attempts- will try back a little later.

## 2019-01-29 NOTE — Telephone Encounter (Signed)
Attempted to call the patient with MD recommendations. Call rang multiple times then drops. Will call back later.

## 2019-01-30 ENCOUNTER — Other Ambulatory Visit: Payer: Self-pay | Admitting: Internal Medicine

## 2019-01-30 ENCOUNTER — Other Ambulatory Visit: Payer: Self-pay | Admitting: Cardiovascular Disease

## 2019-02-02 ENCOUNTER — Other Ambulatory Visit: Payer: Self-pay

## 2019-02-02 DIAGNOSIS — J42 Unspecified chronic bronchitis: Secondary | ICD-10-CM

## 2019-02-02 MED ORDER — ALBUTEROL SULFATE HFA 108 (90 BASE) MCG/ACT IN AERS: 2.0000 | INHALATION_SPRAY | Freq: Four times a day (QID) | RESPIRATORY_TRACT | 3 refills | Status: DC | PRN

## 2019-02-03 ENCOUNTER — Other Ambulatory Visit: Payer: Self-pay

## 2019-02-03 DIAGNOSIS — J42 Unspecified chronic bronchitis: Secondary | ICD-10-CM

## 2019-02-03 MED ORDER — ALBUTEROL SULFATE HFA 108 (90 BASE) MCG/ACT IN AERS: 2.0000 | INHALATION_SPRAY | Freq: Four times a day (QID) | RESPIRATORY_TRACT | 3 refills | Status: DC | PRN

## 2019-02-04 ENCOUNTER — Other Ambulatory Visit: Payer: Self-pay

## 2019-02-04 DIAGNOSIS — J42 Unspecified chronic bronchitis: Secondary | ICD-10-CM

## 2019-02-04 MED ORDER — ALBUTEROL SULFATE HFA 108 (90 BASE) MCG/ACT IN AERS: 2.0000 | INHALATION_SPRAY | Freq: Four times a day (QID) | RESPIRATORY_TRACT | 3 refills | Status: DC | PRN

## 2019-02-04 MED ORDER — ISOSORBIDE MONONITRATE ER 60 MG PO TB24: ORAL_TABLET | ORAL | Status: DC

## 2019-02-04 NOTE — Telephone Encounter (Signed)
I spoke with the patient. I advised him of Dr. Olin Pia recommendations to : 1) take imdur at night 2) wear an abdominal binder during his waking hours to help support less drops in his BP.   The patient voices understanding of the above and is agreeable.

## 2019-02-11 DIAGNOSIS — Z79899 Other long term (current) drug therapy: Secondary | ICD-10-CM | POA: Diagnosis not present

## 2019-02-11 DIAGNOSIS — E1140 Type 2 diabetes mellitus with diabetic neuropathy, unspecified: Secondary | ICD-10-CM | POA: Diagnosis not present

## 2019-02-11 DIAGNOSIS — M5416 Radiculopathy, lumbar region: Secondary | ICD-10-CM | POA: Diagnosis not present

## 2019-02-11 DIAGNOSIS — M7062 Trochanteric bursitis, left hip: Secondary | ICD-10-CM | POA: Diagnosis not present

## 2019-02-11 NOTE — Progress Notes (Signed)
Rhonda,  Good am I cant see it but the lesser  thx  SK

## 2019-02-12 ENCOUNTER — Telehealth: Payer: Self-pay | Admitting: Cardiovascular Disease

## 2019-02-12 MED ORDER — FUROSEMIDE 20 MG PO TABS: ORAL_TABLET | ORAL | 1 refills | Status: DC

## 2019-02-12 NOTE — Telephone Encounter (Signed)
I spoke with the patient to advise him his refill for lasix was updated on 01/13/19 to the [facility] on [address] for lasix 20 mg- take 2 tablets (40 mg) once every other day alternating with 1 tablet (20 mg) once every other day # 135 tablets w/ 3 refills.  Per the patient, he thought he remembered that conversation, but doesn't sound like he picked up the refill. He states he called the pharmacy and they said they would have to call our office.  He states he usually take 1 tablet (20 mg) of lasix daily and take 2 tablets (40 mg) as needed when he swells.  He states he is swollen now, but didn't take 2 tablets of lasix today because he didn't have enough.   I have advised I will send his refill back in for lasix 20 mg- take 2 tablets (40 mg) once every other day alternating with 1 tablet (20 mg) once every other day.  I have also advised the patient if he is swelling, he should try taking his lasix as prescribed. He is aware he will need a BMP and that his order is still in from April- he is aware he will need to get this done if taking the increased dose of lasix as prescribed.  The patient voices understanding and is agreeable.

## 2019-02-12 NOTE — Telephone Encounter (Signed)
°*  STAT* If patient is at the pharmacy, call can be transferred to refill team.   1. Which medications need to be refilled? (please list name of each medication and dose if known) furosemide (LASIX) 20 MG daily  2. Which pharmacy/location (including street and city if local pharmacy) is medication to be sent to? [facility] on [address]   3. Do they need a 30 day or 90 day supply? 90 day

## 2019-02-16 ENCOUNTER — Other Ambulatory Visit: Payer: Self-pay | Admitting: Pharmacy Technician

## 2019-02-16 ENCOUNTER — Other Ambulatory Visit: Payer: Self-pay | Admitting: Pharmacist

## 2019-02-16 NOTE — Patient Outreach (Signed)
[name]) Care Management  [place]   02/16/2019  Lawrence Steele [birth year] [id number]  Reason for referral: Medication Assistance with Spiriva Ventolin, Incruse and Eliquis  -Noted [facility] assisted patient with patient assistance programs in 2019 for Eliquis, Ventolin, Advair, and Incruse Ellipta  Referral source: Self Current insurance: [employer]  PMHx includes but not limited to:  COPD, current tobacco abuse, OSA, atrial fibrillation on anticoagulation, T2DM, PVD, OA, diverticulitis, bilateral carotid stenosis, CAD, AAA / iliac artery aneurysm, HTN, HLD, morbid obesity  Outreach:  Successful telephone call with [name].  HIPAA identifiers verified.   Subjective:  Patient reports he has been using patient assistance programs for his inhalers and Eliquis "for several years" and is familiar with the application process.  He reports that he was approved for Incruse in 2019 but was switched back to Spiriva for lower copay with [employer] in 2020 until he is eligible to apply again in 2020 ($600 out of pocket copay requirement).  Patient reports he would prefer to stay on Spiriva over Incruse though as he feels Spiriva is easier to use.    Patient reports he checks his CBGs every morning.  He denies having any other medication questions or concerns other than cost.   Objective: The 10-year ASCVD risk score Lawrence Bussing DC Jr., et al., 2013) is: 32.3%   Values used to calculate the score:     Age: 68 years     Sex: Male     Is Non-Hispanic African American: No     Diabetic: Yes     Tobacco smoker: Yes     Systolic Blood Pressure: 151 mmHg     Is BP treated: Yes     HDL Cholesterol: 56 mg/dL     Total Cholesterol: 199 mg/dL  Lab Results  Component Value Date   CREATININE 0.85 11/21/2018   CREATININE 0.93 09/19/2018   CREATININE 0.82 09/05/2018    Lab Results  Component Value Date   HGBA1C 6.9 (A) 12/04/2018    Lipid Panel     Component Value Date/Time    CHOL 199 10/25/2016 0753   TRIG 114 10/25/2016 0753   HDL 56 10/25/2016 0753   CHOLHDL 3.6 10/25/2016 0753   VLDL 23 10/25/2016 0753   LDLCALC 120 (H) 10/25/2016 0753    BP Readings from Last 3 Encounters:  01/22/19 118/68  12/11/18 135/77  12/04/18 (!) 103/56    Allergies  Allergen Reactions  . Codeine Itching    Pt reports tolerating oral opioids.  . Quinolones     Aneurysmal disease (History of AAA repair)    Medications Reviewed Today    Reviewed by Lawrence Steele, Lawrence Steele (Registered Medical Assistant) on 01/22/19 at [phone]  Med List Status: <None>  Medication Order Taking? Sig Documenting Provider Last Dose Status Informant  Accu-Chek Softclix Lancets lancets [id number] Yes Use as instructed  Once a daily E11.65 Lawrence Freshwater, NP Taking Active   acetaminophen (TYLENOL) 500 MG tablet [id number] Yes Take 500 mg by mouth every 6 (six) hours as needed.  [provider] Taking Active Self           Med Note Lawrence Steele   Sat Mar 30, 2017 10:43 PM)    albuterol [facility] HFA) [phone] Base) MCG/ACT inhaler [id number] Yes Inhale 2 puffs into the lungs every 6 (six) hours as needed. Lawrence Guise, MD Taking Active Self  allopurinol (ZYLOPRIM) 100 MG tablet [id number] Yes Take 1 tablet (100 mg  total) by mouth 2 (two) times daily. Lawrence Freshwater, NP Taking Active   apixaban (ELIQUIS) 5 MG TABS tablet [id number] Yes Take 1 tablet (5 mg total) by mouth 2 (two) times daily. Lawrence Merritts, MD Taking Active   atenolol (TENORMIN) 50 MG tablet [id number] Yes Take 1 tablet (50 mg total) by mouth daily. Lawrence Merritts, MD Taking Active Self  atorvastatin (LIPITOR) 20 MG tablet [id number] Yes Take 1 tablet (20 mg total) by mouth daily. Lawrence Merritts, MD Taking Active Self  bisacodyl (DULCOLAX) 5 MG EC tablet [id number] Yes Take 1 tablet (5 mg total) by mouth daily as needed for moderate constipation. Lawrence Mango, MD Taking Active   Blood Glucose Monitoring Suppl  (ACCU-CHEK AVIVA PLUS) w/Device KIT [id number] Yes Use once daily to check blood sugar. Lawrence Freshwater, NP Taking Active Self  Blood Glucose Monitoring Suppl (ACCU-CHEK AVIVA PLUS) w/Device KIT [id number] Yes Use as directed Lawrence Freshwater, NP Taking Active   ciprofloxacin (CIPRO) 500 MG tablet [id number] No Take 1 tablet (500 mg total) by mouth 2 (two) times daily.  Patient not taking:  Reported on 01/22/2019   Lawrence Freshwater, NP Not Taking Active   cyclobenzaprine (FLEXERIL) 10 MG tablet [id number] Yes Take 1 tablet (10 mg total) by mouth 2 (two) times daily at 10 AM and 5 PM. Lawrence Freshwater, NP Taking Active   dicyclomine (BENTYL) 10 MG capsule [id number] Yes Take 1 capsule (10 mg total) by mouth 4 (four) times daily -  before meals and at bedtime. Lawrence Freshwater, NP Taking Active   escitalopram (LEXAPRO) 5 MG tablet [id number] Yes Take 1 tablet (5 mg total) by mouth daily. Lawrence Freshwater, NP Taking Active Self  furosemide (LASIX) 20 MG tablet [id number] Yes Take 2 tablets (40 mg) by mouth daily alternating with 1 tablet (20 mg) by mouth daily.  Patient taking differently:  daily.    Lawrence Steele, Lawrence Steele Taking Active   gabapentin (NEURONTIN) 400 MG capsule [id number] Yes Take 400 mg by mouth 3 (three) times daily.  [provider] Taking Active Self           Med Note (Lawrence Steele   Wed Sep 03, 2018  6:17 PM)    glimepiride (AMARYL) 1 MG tablet [id number] Yes Take 2 tablets (2 mg total) by mouth daily with supper. Lawrence Freshwater, NP Taking Active Self  glucose blood (ACCU-CHEK AVIVA PLUS) test strip [id number] Yes 1 each by Other route as needed for other. Use as instructed Lawrence Freshwater, NP Taking Active Self  glucose blood (ACCU-CHEK AVIVA PLUS) test strip [id number] Yes Pt needs accu check Aviva plus lancets, test strips and meter to check blood sugars once daily Lawrence Freshwater, NP Taking Active   HYDROcodone-acetaminophen (NORCO) 5-325 MG tablet [id number] Yes Take  1 tablet by mouth 3 (three) times daily.  [provider] Taking Active Self  hydroxypropyl methylcellulose / hypromellose (ISOPTO TEARS / GONIOVISC) 2.5 % ophthalmic solution [id number] Yes Place 1 drop into both eyes daily as needed for dry eyes. [provider] Taking Active Self  ipratropium (ATROVENT) 0.02 % nebulizer solution [id number] Yes Use every 6 hours for Shortness of breath and wheezing and as needed.  Prescription is for 90 days Lawrence Freshwater, NP Taking Active Self  isosorbide mononitrate (IMDUR) 60 MG 24 hr tablet [id number] Yes Take 1 tablet (60 mg total) by mouth daily. Lawrence Freshwater, NP Taking Active Self  metFORMIN (GLUCOPHAGE) 500  MG tablet [id number] Yes Take 1 tablet (500 mg total) by mouth 2 (two) times daily with a meal. Lawrence Freshwater, NP Taking Active Self  metroNIDAZOLE (FLAGYL) 500 MG tablet [id number] No Take 1 tablet (500 mg total) by mouth 2 (two) times daily.  Patient not taking:  Reported on 01/22/2019   Lawrence Freshwater, NP Not Taking Active   nitroGLYCERIN (NITROSTAT) 0.4 MG SL tablet [id number] Yes Place 0.4 mg under the tongue. [provider] Taking Active Self           Med Note (Lawrence Steele   Wed Sep 03, 2018  6:21 PM) Pt may need a refill because home medication is expired.   OXYGEN [id number] Yes Inhale 2 L into the lungs continuous.  [provider] Taking Active Self  SPIRIVA HANDIHALER 18 MCG inhalation capsule [id number] Yes Inhale 1 puff by mouth once daily Lawrence Guise, MD Taking Active   traZODone (DESYREL) 100 MG tablet [id number] Yes Take 1 tablet (100 mg total) by mouth at bedtime. Lawrence Freshwater, NP Taking Active   umeclidinium bromide (INCRUSE ELLIPTA) 62.5 MCG/INH AEPB [id number] Yes Inhale 1 puff into the lungs daily. Lawrence Guise, MD Taking Active Self          Assessment: Drugs sorted by system:  Neurologic/Psychologic: escitalopram, gabapentin  Hematologic: apixaban  Cardiovascular:  atorvastatin, furosemide, isosorbide mononitrate  Pulmonary/Allergy: albuterol inhaler, ipratropium nebulizer, fluticasone-salmeterol inhaler, tiotropium inhaler  Gastrointestinal:bisacodyl, dicyclomine  Endocrine: glimepiride, metformin  Topical:isopto tears eye drops  Pain: acetaminophen, cyclobenzaprine, hydrocodone-acetaminophen, trazodone  Miscellaneous: allopurinol  Medication Review Findings:  . Removed ciprofloxacin and metronidazole from profile as patient completed antibiotic courses . Dicyclomine: Taking once daily rather than QID as he states his stomach pain has improved . Furosemide:  Confirmed patient is taking dose as prescribed 62m alternating with 450m   . Marland Kitchencetaminophen + Hydrocodone-APAP: Patient aware not to take both due to duplicate acetaminophen products . Glimepiride:  Taking 59m38mID.  Reviewed instructions to take 2mg60mce daily due to pharmacokinetics.  Patient voiced understanding. o Not generally recommended in elderly patients due to risk of hypoglycemia.  Adjust as clinically warranted.    Medication Assistance Findings:  Medication assistance needs identified: Apixaban, Advair, Albuterol, Spiriva  Extra Help:  Not eligible for Extra Help Low Income Subsidy based on reported income and assets  Patient Assistance Programs: Apixaban made by [employer] o Income requirement met: Yes o Out-of-pocket prescription expenditure met:   Yes - Patient has met application requirements to apply for this patient assistance program.   - Reviewed program requirements with patient.    Spiriva made by [employer] met: Yes o Out-of-pocket prescription expenditure met:   Not Applicable - Patient has met application requirements to apply for this patient assistance program.   - Reviewed program requirements with patient.    Ventolin and Advair made by [employer] [place] requirement met: Yes o Out-of-pocket prescription expenditure met:    Yes - Patient has met application requirements to apply for this patient assistance program.   - Reviewed program requirements with patient.    Plan: . I will route patient assistance letter to [facility] [place] who will coordinate patient assistance program application process for medications listed above.  [facility] will assist with obtaining all required documents from both patient and provider(s) and submit application(s) once completed.    CollRalene BathearmD, BCPSProspect-[phone]

## 2019-02-16 NOTE — Patient Outreach (Addendum)
[facility]) Care Management  02/16/2019  Lawrence Steele 1951-05-19 [id number]   Incoming call from Lawrence Steele stating that I had helped him previously with getting patient assistance for his medication. He says that he has spent about $800 OOP so far and requests that we help him with assistance again.  Will route note to [place] for medication review.  Maud Deed Chana Bode CPhT Certified Pharmacy Technician [place] Management Direct [phone]   ADDENDUM 3:42 pm                         Medication Assistance Referral  Referral From: [place]  Medication/Company: Advair and Ventolin HFA / [place] Patient application portion:  Mailed Provider application portion: Faxed  to Dr. Junius Creamer  Medication/Company: [employer] / [employer] Patient application portion:  Mailed Provider application portion: Faxed  to Dr. Junius Creamer  Medication/Company: Eliquis / [name] Patient application portion:  Mailed Provider application portion: Faxed  to Dr. Rockey Situ   Follow up:  Will follow up with patient in 7-10 business days to confirm application(s) have been received.  Maud Deed Chana Bode [place] Certified Pharmacy Technician [place] Management Direct [phone]

## 2019-02-17 ENCOUNTER — Other Ambulatory Visit: Payer: Self-pay

## 2019-02-17 ENCOUNTER — Ambulatory Visit (INDEPENDENT_AMBULATORY_CARE_PROVIDER_SITE_OTHER): Payer: Medicare HMO | Admitting: Internal Medicine

## 2019-02-17 ENCOUNTER — Encounter: Payer: Self-pay | Admitting: Internal Medicine

## 2019-02-17 VITALS — Ht 69.0 in | Wt 266.8 lb

## 2019-02-17 DIAGNOSIS — I483 Typical atrial flutter: Secondary | ICD-10-CM | POA: Diagnosis not present

## 2019-02-17 DIAGNOSIS — I2510 Atherosclerotic heart disease of native coronary artery without angina pectoris: Secondary | ICD-10-CM

## 2019-02-17 NOTE — Progress Notes (Signed)
Error

## 2019-02-17 NOTE — Progress Notes (Addendum)
Patient Care Team: Ronnell Freshwater, NP as PCP - General (Family Medicine) Rockey Situ Kathlene November, MD as PCP - Cardiology (Cardiology) [place], Eppie Gibson, RN as [place] Management Adaline Sill, CPhT as [place] Management (Pharmacy Technician) Rudean Haskell, [facility] as [place] (Pharmacist)   HPI  Lawrence Steele is a 68 y.o. male  Seen in follow-up for atrial flutter.  Initial visit with by telehealth 5/20. Recurrent tachypalpitations with shortness of breath and lightheadedness.  Anticoagulated with apixaban.  He has oxygen dependent COPD.  DATE TEST EF   12/15 LHC  51 % CA Nonobstructive  2/18 Myoview 62% Non Ischemic  3/20 Echo  60-65% LA  (42/1.7/24)   Date Cr K Hgb  3/20 0.85 4.2 14.3         History of cough syncope.  ECG 7/19 Atrial flutter typical 2:1 block >>DCCV   ECG 12/19 Atrial flutter typical 2:1 AV block >> DCCV  Thromboembolic risk factors ( age -69  HTN-1 ,   DM-1 ) for a CHADSVASc Score of 3  Dizziness without change following the adjusting of his isosorbide.  Has not been using a back brace.  Also significant gait instability.  Orthostatic vital signs demonstrated a drop in 10-30 mm from sitting to standing.  Phone note 01/22/2019  Aware of heart rates in the high 80s.   Records and Results Reviewed   Past Medical History:  Diagnosis Date  . AAA (abdominal aortic aneurysm) ([place])    a.  Status post endovascular repair at [facility] in 2016  . Anxiety   . Asthma   . Atrial flutter ([place])    a. diagnosed 7/19; b. CHADS2VASc => 5 (CHF, HTN, age x 1, DM, vascular disease); c. on Eliquis; d. s/p TEE/DCCV 8/19 with repeat DCCV 12/19  . Chronic diastolic CHF (congestive heart failure) ([place])   . COPD (chronic obstructive pulmonary disease) ([place])   . Coronary artery disease    a. LHC 5/10: left main 10%, mLAD-1 lesion 20%, mLAD-2 lesion 20%, D1 20%, mLCx-1 lesion 30%, mLCx-2  lesion 50%, OM1 20%, OM to 20%, LPL 20%, pRCA-1 lesion 20%, pRCA-2 lesion 20%.  EF 51% w/ mild inf HK  . Diabetes mellitus with complication ([place])   . Hyperlipidemia   . Hypertension   . Iliac artery aneurysm, bilateral ([place])   . Obstructive sleep apnea-hypopnea syndrome     Past Surgical History:  Procedure Laterality Date  . ABDOMINAL AORTIC ANEURYSM REPAIR  03/06/2015   [place]    . APPENDECTOMY    . CARDIAC CATHETERIZATION  2010,03/16/2003,11/04/2002  . CARDIOVERSION N/A 04/24/2018   Procedure: CARDIOVERSION;  Surgeon: Nelva Bush, MD;  Location: [place] [facility];  Service: Cardiovascular;  Laterality: N/A;  . CARDIOVERSION N/A 09/05/2018   Procedure: CARDIOVERSION;  Surgeon: Minna Merritts, MD;  Location: [facility];  Service: Cardiovascular;  Laterality: N/A;  . CARDIOVERSION N/A 11/24/2018   Procedure: CARDIOVERSION (CATH LAB);  Surgeon: Wellington Hampshire, MD;  Location: [facility];  Service: Cardiovascular;  Laterality: N/A;  . CORONARY ANGIOPLASTY  03/16/2003   stent to the RCA  & 2/18/2004stent mid circumflex  . ELBOW SURGERY    . HERNIA REPAIR    . NASAL SINUS SURGERY    . PENILE PROSTHESIS IMPLANT  1995  . PILONIDAL CYST EXCISION    . TEE WITHOUT CARDIOVERSION N/A 04/24/2018   Procedure: TRANSESOPHAGEAL ECHOCARDIOGRAM (TEE);  Surgeon: End,  Harrell Gave, MD;  Location: [facility];  Service: Cardiovascular;  Laterality: N/A;  . TONSILECTOMY, ADENOIDECTOMY, BILATERAL MYRINGOTOMY AND TUBES    . UMBILICAL HERNIA REPAIR      Current Meds  Medication Sig  . Accu-Chek Softclix Lancets lancets Use as instructed  Once a daily E11.65  . acetaminophen (TYLENOL) 500 MG tablet Take 500 mg by mouth every 6 (six) hours as needed.   Marland Kitchen albuterol (PROAIR HFA) 108 (90 Base) MCG/ACT inhaler Inhale 2 puffs into the lungs every 6 (six) hours as needed.  Marland Kitchen allopurinol (ZYLOPRIM) 100 MG tablet Take 1 tablet (100 mg total) by mouth 2 (two) times daily.  Marland Kitchen apixaban (ELIQUIS) 5 MG TABS  tablet Take 1 tablet (5 mg total) by mouth 2 (two) times daily.  Marland Kitchen atenolol (TENORMIN) 50 MG tablet Take 1 tablet (50 mg total) by mouth daily.  Marland Kitchen atorvastatin (LIPITOR) 20 MG tablet Take 1 tablet by mouth once daily  . bisacodyl (DULCOLAX) 5 MG EC tablet Take 1 tablet (5 mg total) by mouth daily as needed for moderate constipation.  . Blood Glucose Monitoring Suppl (ACCU-CHEK AVIVA PLUS) w/Device KIT Use as directed  . cyclobenzaprine (FLEXERIL) 10 MG tablet Take 1 tablet (10 mg total) by mouth 2 (two) times daily at 10 AM and 5 PM.  . dicyclomine (BENTYL) 10 MG capsule Take 1 capsule (10 mg total) by mouth 4 (four) times daily -  before meals and at bedtime. (Patient taking differently: Take 10 mg by mouth daily. )  . escitalopram (LEXAPRO) 5 MG tablet Take 1 tablet (5 mg total) by mouth daily.  . Fluticasone-Salmeterol (ADVAIR) 250-50 MCG/DOSE AEPB Inhale 1 puff into the lungs 2 (two) times daily.  . furosemide (LASIX) 20 MG tablet Take 2 tablets (40 mg) by mouth once every other day alternating with 1 tablet (20 mg) by mouth once every other day.  . gabapentin (NEURONTIN) 400 MG capsule Take 400 mg by mouth 4 (four) times daily.   Marland Kitchen glimepiride (AMARYL) 1 MG tablet Take 2 tablets (2 mg total) by mouth daily with supper.  Marland Kitchen glucose blood (ACCU-CHEK AVIVA PLUS) test strip 1 each by Other route as needed for other. Use as instructed  . glucose blood (ACCU-CHEK AVIVA PLUS) test strip Pt needs accu check Aviva plus lancets, test strips and meter to check blood sugars once daily  . HYDROcodone-acetaminophen (NORCO) 5-325 MG tablet Take 1 tablet by mouth 3 (three) times daily.   . hydroxypropyl methylcellulose / hypromellose (ISOPTO TEARS / GONIOVISC) 2.5 % ophthalmic solution Place 1 drop into both eyes daily as needed for dry eyes.  Marland Kitchen ipratropium (ATROVENT) 0.02 % nebulizer solution Use every 6 hours for Shortness of breath and wheezing and as needed.  Prescription is for 90 days  . isosorbide  mononitrate (IMDUR) 60 MG 24 hr tablet Take 1 tablet (60 mg) by mouth once daily at bedtime  . metFORMIN (GLUCOPHAGE) 500 MG tablet Take 1 tablet (500 mg total) by mouth 2 (two) times daily with a meal.  . nitroGLYCERIN (NITROSTAT) 0.4 MG SL tablet Place 0.4 mg under the tongue.  . OXYGEN Inhale 2 L into the lungs continuous.   Marland Kitchen SPIRIVA HANDIHALER 18 MCG inhalation capsule Inhale 1 puff by mouth once daily  . traZODone (DESYREL) 100 MG tablet Take 1 tablet (100 mg total) by mouth at bedtime.    Allergies  Allergen Reactions  . Codeine Itching    Pt reports tolerating oral opioids.  . Quinolones  Aneurysmal disease (History of AAA repair)      Review of Systems negative except from HPI and PMH  Physical Exam Ht _0  (1.753 m)   Wt 266 lb 12.8 oz (121 kg)   BMI 39.40 kg/m  Well developed and Morbidly obese in no acute distress HENT normal Neck supple with JVP-flat Clear Regular rate and rhythm, no  gallop No / murmur Abd-soft with active BS No Clubbing cyanosis  edema Skin-warm and dry A & Oriented  Grossly normal sensory and motor function  ECG supraventricular rhythm with a short PR interval inverted in the inferior leads with a PAC giving rise to a beat with a normal PR interval upright suggesting PJ RT  Old ECGs back to 2019 were reviewed.  Almost all of them have a negative P wave even with heart rates down into the 50s.  Lows decline  ECG with carotid massage resulted in slowing of his heart rate to about 50 bpm without a change in P wave morphology   Assessment and  Plan  Atrial flutter recurrent  COPD oxygen dependent uses intermittently  Abdominal and Aortic aneurysmal disease  Morbidly obese  Quinolone therapy previous  Pain-chronic-opiate dependence  Gait instability  Orthostatic intolerance   Appropriate to consider catheter ablation for his atrial flutter.     We will plan to proceed COVID tier 3 given his lung issues would like to  minimize exposures.  He is holding sinus rhythm.  Discussed again abdominal binders orthostatic intolerance.  We will need an MRI to exclude stroke prior to a decision regarding discontinuing anticoagulation  Gait instability remains a concern for possible prior stroke.  Will need MRI prior to making a decision regarding anticoagulation      Current medicines are reviewed at length with the patient today .  The patient does no have concerns regarding medicines.

## 2019-02-17 NOTE — Patient Instructions (Addendum)
Medication Instructions:  - Your physician recommends that you continue on your current medications as directed. Please refer to the Current Medication list given to you today.  If you need a refill on your cardiac medications before your next appointment, please call your pharmacy.   Lab work: - none ordered  If you have labs (blood work) drawn today and your tests are completely normal, you will receive your results only by: Marland Kitchen MyChart Message (if you have MyChart) OR . A paper copy in the mail If you have any lab test that is abnormal or we need to change your treatment, we will call you to review the results.  Testing/Procedures: - Your physician has recommended that you have an ablation. Catheter ablation is a medical procedure used to treat some cardiac arrhythmias (irregular heartbeats). During catheter ablation, a long, thin, flexible tube is put into a blood vessel in your groin (upper thigh), or neck. This tube is called an ablation catheter. It is then guided to your heart through the blood vessel. Radio frequency waves destroy small areas of heart tissue where abnormal heartbeats may cause an arrhythmia to start.   - Dr. Olin Pia nurse, Nira Conn will call you to schedule this in a few weeks  - Your physician has recommended that you have brain MRI- we will be in touch with you in few weeks to schedule this (pending COVID restrictions)  Follow-Up: At [facility], you and your health needs are our priority.  As part of our continuing mission to provide you with exceptional heart care, we have created designated Provider Care Teams.  These Care Teams include your primary Cardiologist (physician) and Advanced Practice Providers (APPs -  Physician Assistants and Nurse Practitioners) who all work together to provide you with the care you need, when you need it. . pending the date of your ablation  Any Other Special Instructions Will Be Listed Below (If Applicable). - Please wear an  abdominal binder during your waking hours. You should be able to get this at [facility]. - you may have to get 2 of them and velcro them together   Cardiac Ablation Cardiac ablation is a procedure to disable (ablate) a small amount of heart tissue in very specific places. The heart has many electrical connections. Sometimes these connections are abnormal and can cause the heart to beat very fast or irregularly. Ablating some of the problem areas can improve the heart rhythm or return it to normal. Ablation may be done for people who:  Have Wolff-Parkinson-White syndrome.  Have fast heart rhythms (tachycardia).  Have taken medicines for an abnormal heart rhythm (arrhythmia) that were not effective or caused side effects.  Have a high-risk heartbeat that may be life-threatening. During the procedure, a small incision is made in the neck or the groin, and a long, thin, flexible tube (catheter) is inserted into the incision and moved to the heart. Small devices (electrodes) on the tip of the catheter will send out electrical currents. A type of X-ray (fluoroscopy) will be used to help guide the catheter and to provide images of the heart. Tell a health care provider about:  Any allergies you have.  All medicines you are taking, including vitamins, herbs, eye drops, creams, and over-the-counter medicines.  Any problems you or family members have had with anesthetic medicines.  Any blood disorders you have.  Any surgeries you have had.  Any medical conditions you have, such as kidney failure.  Whether you are pregnant or may be  pregnant. What are the risks? Generally, this is a safe procedure. However, problems may occur, including:  Infection.  Bruising and bleeding at the catheter insertion site.  Bleeding into the chest, especially into the sac that surrounds the heart. This is a serious complication.  Stroke or blood clots.  Damage to other structures or  organs.  Allergic reaction to medicines or dyes.  Need for a permanent pacemaker if the normal electrical system is damaged. A pacemaker is a small computer that sends electrical signals to the heart and helps your heart beat normally.  The procedure not being fully effective. This may not be recognized until months later. Repeat ablation procedures are sometimes required. What happens before the procedure?  Follow instructions from your health care provider about eating or drinking restrictions.  Ask your health care provider about: ? Changing or stopping your regular medicines. This is especially important if you are taking diabetes medicines or blood thinners. ? Taking medicines such as aspirin and ibuprofen. These medicines can thin your blood. Do not take these medicines before your procedure if your health care provider instructs you not to.  Plan to have someone take you home from the hospital or clinic.  If you will be going home right after the procedure, plan to have someone with you for 24 hours. What happens during the procedure?  To lower your risk of infection: ? Your health care team will wash or sanitize their hands. ? Your skin will be washed with soap. ? Hair may be removed from the incision area.  An IV tube will be inserted into one of your veins.  You will be given a medicine to help you relax (sedative).  The skin on your neck or groin will be numbed.  An incision will be made in your neck or your groin.  A needle will be inserted through the incision and into a large vein in your neck or groin.  A catheter will be inserted into the needle and moved to your heart.  Dye may be injected through the catheter to help your surgeon see the area of the heart that needs treatment.  Electrical currents will be sent from the catheter to ablate heart tissue in desired areas. There are three types of energy that may be used to ablate heart tissue: ? Heat  (radiofrequency energy). ? Laser energy. ? Extreme cold (cryoablation).  When the necessary tissue has been ablated, the catheter will be removed.  Pressure will be held on the catheter insertion area to prevent excessive bleeding.  A bandage (dressing) will be placed over the catheter insertion area. The procedure may vary among health care providers and hospitals. What happens after the procedure?  Your blood pressure, heart rate, breathing rate, and blood oxygen level will be monitored until the medicines you were given have worn off.  Your catheter insertion area will be monitored for bleeding. You will need to lie still for a few hours to ensure that you do not bleed from the catheter insertion area.  Do not drive for 24 hours or as long as directed by your health care provider. Summary  Cardiac ablation is a procedure to disable (ablate) a small amount of heart tissue in very specific places. Ablating some of the problem areas can improve the heart rhythm or return it to normal.  During the procedure, electrical currents will be sent from the catheter to ablate heart tissue in desired areas. This information is not intended to replace advice  given to you by your health care provider. Make sure you discuss any questions you have with your health care provider. Document Released: 01/20/2009 Document Revised: 07/23/2016 Document Reviewed: 07/23/2016 Elsevier Interactive Patient Education  2019 [employer].

## 2019-02-18 ENCOUNTER — Telehealth: Payer: Self-pay | Admitting: Cardiovascular Disease

## 2019-02-18 ENCOUNTER — Other Ambulatory Visit: Payer: Self-pay | Admitting: *Deleted

## 2019-02-18 MED ORDER — APIXABAN 5 MG PO TABS: 5.0000 mg | ORAL_TABLET | Freq: Two times a day (BID) | ORAL | 3 refills | Status: DC

## 2019-02-18 NOTE — Telephone Encounter (Signed)
Fax received from [employer] stating the patient meets eligibility requirements for patient assistance with [employer]MS for Eliquis.  Physician portion of the application was faxed by [facility] to be completed by Dr. Rockey Situ. I printed out a RX for eliquis 5 mg BID for Dr. Rockey Situ to sign as well.  Forms will be placed on MD desk once he is done with e-visits for today.

## 2019-02-18 NOTE — Telephone Encounter (Signed)
Patient assistance form and RX for Eliquis signed by Dr. Rockey Situ and faxed back to [facility] at [phone]. Confirmation received.

## 2019-02-19 ENCOUNTER — Other Ambulatory Visit: Payer: Self-pay | Admitting: Pharmacy Technician

## 2019-02-19 NOTE — Patient Outreach (Signed)
[place]) Care Management  02/19/2019  RICKI CLACK 07-09-1951 [id number]   Incoming call from patient stating he received patient assistance applications for Advair, Ventolin HFA, Spiriva and Eliquis. He states that he will mail them back into tomorrow.  Will submit applications to companies once all documents have been received.  Maud Deed Chana Bode [place] Certified Pharmacy Technician [place] Management Direct Dial:[phone]

## 2019-02-26 ENCOUNTER — Telehealth: Payer: Self-pay

## 2019-02-26 ENCOUNTER — Other Ambulatory Visit: Payer: Self-pay | Admitting: Pharmacy Technician

## 2019-02-26 NOTE — Patient Outreach (Addendum)
[facility]) Care Management  02/26/2019  ISACK LAVALLEY [birth year] [id number]   Received patient portion(s) of patient assistance application for Advair, Ventolin, Spiriva and Eliquis. Faxed completed application and required documents into [employer] for Eliquis.  Re-faxed provider portions of Spiriva, Ventolin and Advair to [employer] [facility] for completion. Will fax into companies once documents have been received.  Will follow up with [employer] in 5-7 business days to check status of application.  Maud Deed Chana Bode [place] Certified Pharmacy Technician [place] Management Direct [phone]   ADDENDUM 3:50pm  Received provider portions of applications for Advair, Ventolin HFA and Spiriva. Faxed completed applications and required documents into [employer] and [employer].  Will follow up with [place] in 5-7 business days and [employer] in 7-10 business days to check status of applications.  Maud Deed Chana Bode [place] Certified Pharmacy Technician [place] Management Direct [phone]

## 2019-02-26 NOTE — Telephone Encounter (Signed)
Faxed [facility]  Pres,sprivia,ventolin and advair

## 2019-02-27 ENCOUNTER — Other Ambulatory Visit: Payer: Self-pay | Admitting: Pharmacist

## 2019-02-27 NOTE — Telephone Encounter (Signed)
Pt has been approved for patient assistance 02/26/19-09/17/19.

## 2019-02-27 NOTE — Patient Outreach (Signed)
[facility]) Care Management  [place] 02/27/2019  Lawrence Steele [birth year] [id number]  Incoming call and voicemail received from [name] regarding medication assistance.  Patient forms received on 02/26/2019 and faxed to patient assistance programs for Eliquis, Advair, Ventolin, and Spiriva.   Per notes in [facility], patient already approved for Eliquis.  Still waiting approval on other medications.   Return call placed to patient today.  HIPAA compliance voicemail left with update on forms.  [facility] pharmacy technician will f/u with programs regarding approval and will continue to call patient with updates.   Ralene Bathe, PharmD, [place] [phone]

## 2019-03-01 ENCOUNTER — Other Ambulatory Visit: Payer: Self-pay | Admitting: Adult Health

## 2019-03-01 ENCOUNTER — Other Ambulatory Visit: Payer: Self-pay | Admitting: Internal Medicine

## 2019-03-01 NOTE — Telephone Encounter (Signed)
On 6/15

## 2019-03-02 ENCOUNTER — Encounter: Payer: Self-pay | Admitting: Internal Medicine

## 2019-03-02 ENCOUNTER — Other Ambulatory Visit: Payer: Self-pay

## 2019-03-02 ENCOUNTER — Ambulatory Visit (INDEPENDENT_AMBULATORY_CARE_PROVIDER_SITE_OTHER): Payer: Medicare HMO | Admitting: Internal Medicine

## 2019-03-02 VITALS — BP 114/78 | HR 85 | Ht 70.0 in | Wt 268.0 lb

## 2019-03-02 DIAGNOSIS — F17200 Nicotine dependence, unspecified, uncomplicated: Secondary | ICD-10-CM | POA: Diagnosis not present

## 2019-03-02 DIAGNOSIS — Z9989 Dependence on other enabling machines and devices: Secondary | ICD-10-CM

## 2019-03-02 DIAGNOSIS — J441 Chronic obstructive pulmonary disease with (acute) exacerbation: Secondary | ICD-10-CM | POA: Diagnosis not present

## 2019-03-02 DIAGNOSIS — I10 Essential (primary) hypertension: Secondary | ICD-10-CM | POA: Diagnosis not present

## 2019-03-02 DIAGNOSIS — G4733 Obstructive sleep apnea (adult) (pediatric): Secondary | ICD-10-CM | POA: Diagnosis not present

## 2019-03-02 DIAGNOSIS — I482 Chronic atrial fibrillation, unspecified: Secondary | ICD-10-CM | POA: Diagnosis not present

## 2019-03-02 DIAGNOSIS — J9611 Chronic respiratory failure with hypoxia: Secondary | ICD-10-CM

## 2019-03-02 MED ORDER — SPIRIVA HANDIHALER 18 MCG IN CAPS: 18.0000 ug | ORAL_CAPSULE | Freq: Every day | RESPIRATORY_TRACT | 5 refills | Status: DC

## 2019-03-02 MED ORDER — FLUTICASONE-SALMETEROL 250-50 MCG/DOSE IN AEPB: 1.0000 | INHALATION_SPRAY | Freq: Two times a day (BID) | RESPIRATORY_TRACT | 4 refills | Status: AC

## 2019-03-02 NOTE — Progress Notes (Signed)
[facility] [place], [place] [address]  Internal MEDICINE  Telephone Visit  Patient Name: Lawrence Steele  [id number]  [id number]  Date of Service: 03/02/2019  I connected with the patient at 1105 by telephone and verified the patients identity using two identifiers.   I discussed the limitations, risks, security and privacy concerns of performing an evaluation and management service by telephone and the availability of in person appointments. I also discussed with the patient that there may be a patient responsible charge related to the service.  The patient expressed understanding and agrees to proceed.    Chief Complaint  Patient presents with  . Telephone Screen  . Telephone Assessment  . Follow-up  . COPD    HPI  Pt seen via telephone for 3 month pulmonary follow up.  PT reports his breathing has been at his baseline.  He is using his inhalers daily.  Denies any new or worsening symptoms. He continues to have issues with his a-fib and is scheduled to have an ablation done in mid July (per patient) depending on covid-19 issues.     Current Medication: Outpatient Encounter Medications as of 03/02/2019  Medication Sig  . Accu-Chek Softclix Lancets lancets Use as instructed  Once a daily E11.65  . acetaminophen (TYLENOL) 500 MG tablet Take 500 mg by mouth every 6 (six) hours as needed.   Marland Kitchen albuterol (PROAIR HFA) 108 (90 Base) MCG/ACT inhaler Inhale 2 puffs into the lungs every 6 (six) hours as needed.  Marland Kitchen allopurinol (ZYLOPRIM) 100 MG tablet Take 1 tablet (100 mg total) by mouth 2 (two) times daily.  Marland Kitchen apixaban (ELIQUIS) 5 MG TABS tablet Take 1 tablet (5 mg total) by mouth 2 (two) times daily.  Marland Kitchen atenolol (TENORMIN) 50 MG tablet Take 1 tablet (50 mg total) by mouth daily.  Marland Kitchen atorvastatin (LIPITOR) 20 MG tablet Take 1 tablet by mouth once daily  . bisacodyl (DULCOLAX) 5 MG EC tablet Take 1 tablet (5 mg total) by mouth daily as needed for moderate  constipation.  . Blood Glucose Monitoring Suppl (ACCU-CHEK AVIVA PLUS) w/Device KIT Use as directed  . cyclobenzaprine (FLEXERIL) 10 MG tablet Take 1 tablet (10 mg total) by mouth 2 (two) times daily at 10 AM and 5 PM.  . dicyclomine (BENTYL) 10 MG capsule Take 1 capsule (10 mg total) by mouth 4 (four) times daily -  before meals and at bedtime. (Patient taking differently: Take 10 mg by mouth daily. )  . escitalopram (LEXAPRO) 5 MG tablet Take 1 tablet (5 mg total) by mouth daily.  . Fluticasone-Salmeterol (ADVAIR) 250-50 MCG/DOSE AEPB Inhale 1 puff into the lungs 2 (two) times daily.  . furosemide (LASIX) 20 MG tablet Take 2 tablets (40 mg) by mouth once every other day alternating with 1 tablet (20 mg) by mouth once every other day.  . gabapentin (NEURONTIN) 400 MG capsule Take 400 mg by mouth 4 (four) times daily.   Marland Kitchen glimepiride (AMARYL) 1 MG tablet Take 2 tablets (2 mg total) by mouth daily with supper.  Marland Kitchen glucose blood (ACCU-CHEK AVIVA PLUS) test strip 1 each by Other route as needed for other. Use as instructed  . glucose blood (ACCU-CHEK AVIVA PLUS) test strip Pt needs accu check Aviva plus lancets, test strips and meter to check blood sugars once daily  . HYDROcodone-acetaminophen (NORCO) 5-325 MG tablet Take 1 tablet by mouth 3 (three) times daily.   . hydroxypropyl methylcellulose / hypromellose (ISOPTO TEARS / GONIOVISC) 2.5 %  ophthalmic solution Place 1 drop into both eyes daily as needed for dry eyes.  Marland Kitchen ipratropium (ATROVENT) 0.02 % nebulizer solution Use every 6 hours for Shortness of breath and wheezing and as needed.  Prescription is for 90 days  . isosorbide mononitrate (IMDUR) 60 MG 24 hr tablet Take 1 tablet (60 mg) by mouth once daily at bedtime  . metFORMIN (GLUCOPHAGE) 500 MG tablet Take 1 tablet (500 mg total) by mouth 2 (two) times daily with a meal.  . nitroGLYCERIN (NITROSTAT) 0.4 MG SL tablet Place 0.4 mg under the tongue.  . OXYGEN Inhale 2 L into the lungs continuous.    Marland Kitchen SPIRIVA HANDIHALER 18 MCG inhalation capsule Inhale 1 puff by mouth once daily  . traZODone (DESYREL) 100 MG tablet Take 1 tablet (100 mg total) by mouth at bedtime.   No facility-administered encounter medications on file as of 03/02/2019.     Surgical History: Past Surgical History:  Procedure Laterality Date  . ABDOMINAL AORTIC ANEURYSM REPAIR  03/06/2015   [place]    . APPENDECTOMY    . CARDIAC CATHETERIZATION  2010,03/16/2003,11/04/2002  . CARDIOVERSION N/A 04/24/2018   Procedure: CARDIOVERSION;  Surgeon: Nelva Bush, MD;  Location: [facility];  Service: Cardiovascular;  Laterality: N/A;  . CARDIOVERSION N/A 09/05/2018   Procedure: CARDIOVERSION;  Surgeon: Minna Merritts, MD;  Location: [facility];  Service: Cardiovascular;  Laterality: N/A;  . CARDIOVERSION N/A 11/24/2018   Procedure: CARDIOVERSION (CATH LAB);  Surgeon: Wellington Hampshire, MD;  Location: [facility];  Service: Cardiovascular;  Laterality: N/A;  . CORONARY ANGIOPLASTY  03/16/2003   stent to the RCA  & 2/18/2004stent mid circumflex  . ELBOW SURGERY    . HERNIA REPAIR    . NASAL SINUS SURGERY    . PENILE PROSTHESIS IMPLANT  1995  . PILONIDAL CYST EXCISION    . TEE WITHOUT CARDIOVERSION N/A 04/24/2018   Procedure: TRANSESOPHAGEAL ECHOCARDIOGRAM (TEE);  Surgeon: Nelva Bush, MD;  Location: [facility];  Service: Cardiovascular;  Laterality: N/A;  . TONSILECTOMY, ADENOIDECTOMY, BILATERAL MYRINGOTOMY AND TUBES    . UMBILICAL HERNIA REPAIR      Medical History: Past Medical History:  Diagnosis Date  . AAA (abdominal aortic aneurysm) ([place])    a.  Status post endovascular repair at [facility] in 2016  . Anxiety   . Asthma   . Atrial flutter ([place])    a. diagnosed 7/19; b. CHADS2VASc => 5 (CHF, HTN, age x 1, DM, vascular disease); c. on Eliquis; d. s/p TEE/DCCV 8/19 with repeat DCCV 12/19  . Chronic diastolic CHF (congestive heart failure) ([place])   . COPD (chronic obstructive pulmonary disease) ([place])    . Coronary artery disease    a. LHC 5/10: left main 10%, mLAD-1 lesion 20%, mLAD-2 lesion 20%, D1 20%, mLCx-1 lesion 30%, mLCx-2 lesion 50%, OM1 20%, OM to 20%, LPL 20%, pRCA-1 lesion 20%, pRCA-2 lesion 20%.  EF 51% w/ mild inf HK  . Diabetes mellitus with complication ([place])   . Hyperlipidemia   . Hypertension   . Iliac artery aneurysm, bilateral ([place])   . Obstructive sleep apnea-hypopnea syndrome     Family History: Family History  Problem Relation Age of Onset  . Hypertension Mother   . Hyperlipidemia Mother   . Depression Mother   . Heart attack Father 60  . Heart disease Father   . Hypertension Father   . Hyperlipidemia Father     Social History   Socioeconomic History  . Marital  status: Divorced    Spouse name: Not on file  . Number of children: 2  . Years of education: 10   . Highest education level: 10th grade  Occupational History  . Occupation: disabled   Social Needs  . Financial resource strain: Somewhat hard  . Food insecurity    Worry: Never true    Inability: Never true  . Transportation needs    Medical: No    Non-medical: No  Tobacco Use  . Smoking status: Current Every Day Smoker    Packs/day: 0.25    Years: 45.00    Pack years: 11.25    Types: Cigarettes  . Smokeless tobacco: Former Systems developer    Types: Chew  . Tobacco comment: pt hasnt smoked lately due to health but states he still smokes  Substance and Sexual Activity  . Alcohol use: Not Currently  . Drug use: No  . Sexual activity: Not on file  Lifestyle  . Physical activity    Days per week: 7 days    Minutes per session: 10 min  . Stress: Only a little  Relationships  . Social connections    Talks on phone: More than three times a week    Gets together: More than three times a week    Attends religious service: Never    Active member of club or organization: No    Attends meetings of clubs or organizations: Not on file    Relationship status: Divorced  . Intimate partner violence     Fear of current or ex partner: Not on file    Emotionally abused: Not on file    Physically abused: Not on file    Forced sexual activity: Not on file  Other Topics Concern  . Not on file  Social History Narrative  . Not on file    Review of Systems  Constitutional: Negative.  Negative for chills, fatigue and unexpected weight change.  HENT: Negative.  Negative for congestion, rhinorrhea, sneezing and sore throat.   Eyes: Negative for redness.  Respiratory: Negative.  Negative for cough, chest tightness and shortness of breath.   Cardiovascular: Negative.  Negative for chest pain and palpitations.  Gastrointestinal: Negative.  Negative for abdominal pain, constipation, diarrhea, nausea and vomiting.  Endocrine: Negative.   Genitourinary: Negative.  Negative for dysuria and frequency.  Musculoskeletal: Negative.  Negative for arthralgias, back pain, joint swelling and neck pain.  Skin: Negative.  Negative for rash.  Allergic/Immunologic: Negative.   Neurological: Negative.  Negative for tremors and numbness.  Hematological: Negative for adenopathy. Does not bruise/bleed easily.  Psychiatric/Behavioral: Negative.  Negative for behavioral problems, sleep disturbance and suicidal ideas. The patient is not nervous/anxious.     Vital Signs: BP 114/78   Pulse 85   Ht 5' 10"  (1.778 m)   Wt 268 lb (121.6 kg)   SpO2 93% Comment: room air  BMI 38.45 kg/m    Observation/Objective: Speaking in full sentences, NAD noted.     Assessment/Plan: 1. Chronic obstructive pulmonary disease with acute exacerbation (HCC) Continue to use inhalers as discussed.  - tiotropium (SPIRIVA HANDIHALER) 18 MCG inhalation capsule; Place 1 capsule (18 mcg total) into inhaler and inhale daily for 30 days.  Dispense: 30 capsule; Refill: 5 - Fluticasone-Salmeterol (ADVAIR) 250-50 MCG/DOSE AEPB; Inhale 1 puff into the lungs 2 (two) times daily.  Dispense: 60 each; Refill: 4  2. Essential  hypertension Stable, continue present management.   3. Nicotine dependence with current use Smoking cessation counseling: 1. Pt  acknowledges the risks of long term smoking, she will try to quite smoking. 2. Options for different medications including nicotine products, chewing gum, patch etc, Wellbutrin and Chantix is discussed 3. Goal and date of compete cessation is discussed 4. Total time spent in smoking cessation is 15 min.  4. Chronic hypoxemic respiratory failure (HCC) Continue to use oxygen as prescribed during the day, and at night with cpap.   5. OSA on CPAP Continue to use cpap as directed with oxygen bleeding in at night.  Continue to clean cpap machine with so clean machine.   6. Chronic a-fib Continue to follow up with Dr. Tresa Garter for treatment, or ablation in July.    General Counseling: Elvin verbalizes understanding of the findings of today's phone visit and agrees with plan of treatment. I have discussed any further diagnostic evaluation that may be needed or ordered today. We also reviewed his medications today. he has been encouraged to call the office with any questions or concerns that should arise related to todays visit.    No orders of the defined types were placed in this encounter.   No orders of the defined types were placed in this encounter.   Time spent: [place] [facility] Internal medicine

## 2019-03-04 ENCOUNTER — Other Ambulatory Visit: Payer: Self-pay | Admitting: Pharmacy Technician

## 2019-03-04 NOTE — Patient Outreach (Signed)
[facility] [facility]) Care Management  03/04/2019  INIKO ROBLES [birth year] [id number]    Follow up call placed to [place] regarding patient assistance application(s) for Ventolin HFA and Advair , Chanel confirms patient has been approved for both medications as of 6/15 until 09/17/19. Medication currently being processed for shipping.  Follow up:  Will follow up with company in 3-5 business days to check on shipping details.  Maud Deed Chana Bode [place] Certified Pharmacy Technician [place] Management Direct Dial:[phone]

## 2019-03-05 ENCOUNTER — Ambulatory Visit (INDEPENDENT_AMBULATORY_CARE_PROVIDER_SITE_OTHER): Payer: Medicare HMO | Admitting: Nurse Practitioner

## 2019-03-05 ENCOUNTER — Other Ambulatory Visit: Payer: Self-pay

## 2019-03-05 ENCOUNTER — Encounter: Payer: Self-pay | Admitting: Nurse Practitioner

## 2019-03-05 VITALS — BP 121/65 | HR 75 | Ht 70.0 in | Wt 268.0 lb

## 2019-03-05 DIAGNOSIS — E119 Type 2 diabetes mellitus without complications: Secondary | ICD-10-CM | POA: Diagnosis not present

## 2019-03-05 DIAGNOSIS — I10 Essential (primary) hypertension: Secondary | ICD-10-CM | POA: Diagnosis not present

## 2019-03-05 DIAGNOSIS — R490 Dysphonia: Secondary | ICD-10-CM

## 2019-03-05 DIAGNOSIS — F17200 Nicotine dependence, unspecified, uncomplicated: Secondary | ICD-10-CM | POA: Diagnosis not present

## 2019-03-05 DIAGNOSIS — I4891 Unspecified atrial fibrillation: Secondary | ICD-10-CM | POA: Diagnosis not present

## 2019-03-05 NOTE — Progress Notes (Signed)
[facility] [place], [place] [address]  Internal MEDICINE  Telephone Visit  Patient Name: Lawrence Steele  [id number]  [id number]  Date of Service: 03/05/2019  I connected with the patient at 12:23pm by telephone and verified the patients identity using two identifiers.   I discussed the limitations, risks, security and privacy concerns of performing an evaluation and management service by telephone and the availability of in person appointments. I also discussed with the patient that there may be a patient responsible charge related to the service.  The patient expressed understanding and agrees to proceed.    Chief Complaint  Patient presents with  . Telephone Screen    PHONE VISIT [phone]  . Telephone Assessment  . Medical Management of Chronic Issues    3 month follow up  . Diabetes    blood glucose 124 this AM  . Hypertension    The patient has been contacted via telephone for follow up visit due to concerns for spread of novel coronavirus. He states that he is doing ok. Blood sugars are generally running around 120s. Blood pressures well controlled. Does state that he stays hoarse a lot of the time. Does have mild cough which is intermittent. He continues to smoke. States that he is down to about 1/2 pack of cigarettes per day.       Current Medication: Outpatient Encounter Medications as of 03/05/2019  Medication Sig  . Accu-Chek Softclix Lancets lancets Use as instructed  Once a daily E11.65  . acetaminophen (TYLENOL) 500 MG tablet Take 500 mg by mouth every 6 (six) hours as needed.   Marland Kitchen albuterol (PROAIR HFA) 108 (90 Base) MCG/ACT inhaler Inhale 2 puffs into the lungs every 6 (six) hours as needed.  Marland Kitchen allopurinol (ZYLOPRIM) 100 MG tablet Take 1 tablet (100 mg total) by mouth 2 (two) times daily.  Marland Kitchen apixaban (ELIQUIS) 5 MG TABS tablet Take 1 tablet (5 mg total) by mouth 2 (two) times daily.  Marland Kitchen atenolol (TENORMIN) 50 MG tablet Take 1 tablet (50  mg total) by mouth daily.  Marland Kitchen atorvastatin (LIPITOR) 20 MG tablet Take 1 tablet by mouth once daily  . bisacodyl (DULCOLAX) 5 MG EC tablet Take 1 tablet (5 mg total) by mouth daily as needed for moderate constipation.  . Blood Glucose Monitoring Suppl (ACCU-CHEK AVIVA PLUS) w/Device KIT Use as directed  . cyclobenzaprine (FLEXERIL) 10 MG tablet Take 1 tablet (10 mg total) by mouth 2 (two) times daily at 10 AM and 5 PM.  . dicyclomine (BENTYL) 10 MG capsule Take 1 capsule (10 mg total) by mouth 4 (four) times daily -  before meals and at bedtime. (Patient taking differently: Take 10 mg by mouth daily. )  . escitalopram (LEXAPRO) 5 MG tablet Take 1 tablet (5 mg total) by mouth daily.  . Fluticasone-Salmeterol (ADVAIR) 250-50 MCG/DOSE AEPB Inhale 1 puff into the lungs 2 (two) times daily.  . furosemide (LASIX) 20 MG tablet Take 2 tablets (40 mg) by mouth once every other day alternating with 1 tablet (20 mg) by mouth once every other day.  . gabapentin (NEURONTIN) 400 MG capsule Take 400 mg by mouth 4 (four) times daily.   Marland Kitchen glimepiride (AMARYL) 1 MG tablet Take 2 tablets (2 mg total) by mouth daily with supper.  Marland Kitchen glucose blood (ACCU-CHEK AVIVA PLUS) test strip 1 each by Other route as needed for other. Use as instructed  . glucose blood (ACCU-CHEK AVIVA PLUS) test strip Pt needs accu check  Aviva plus lancets, test strips and meter to check blood sugars once daily  . HYDROcodone-acetaminophen (NORCO) 5-325 MG tablet Take 1 tablet by mouth 3 (three) times daily.   . hydroxypropyl methylcellulose / hypromellose (ISOPTO TEARS / GONIOVISC) 2.5 % ophthalmic solution Place 1 drop into both eyes daily as needed for dry eyes.  Marland Kitchen ipratropium (ATROVENT) 0.02 % nebulizer solution Use every 6 hours for Shortness of breath and wheezing and as needed.  Prescription is for 90 days  . isosorbide mononitrate (IMDUR) 60 MG 24 hr tablet Take 1 tablet (60 mg) by mouth once daily at bedtime  . metFORMIN (GLUCOPHAGE) 500 MG  tablet Take 1 tablet (500 mg total) by mouth 2 (two) times daily with a meal.  . nitroGLYCERIN (NITROSTAT) 0.4 MG SL tablet Place 0.4 mg under the tongue.  . OXYGEN Inhale 2 L into the lungs continuous.   Marland Kitchen tiotropium (SPIRIVA HANDIHALER) 18 MCG inhalation capsule Place 1 capsule (18 mcg total) into inhaler and inhale daily for 30 days.  . traZODone (DESYREL) 100 MG tablet Take 1 tablet (100 mg total) by mouth at bedtime.   No facility-administered encounter medications on file as of 03/05/2019.     Surgical History: Past Surgical History:  Procedure Laterality Date  . ABDOMINAL AORTIC ANEURYSM REPAIR  03/06/2015   [place]    . APPENDECTOMY    . CARDIAC CATHETERIZATION  2010,03/16/2003,11/04/2002  . CARDIOVERSION N/A 04/24/2018   Procedure: CARDIOVERSION;  Surgeon: Nelva Bush, MD;  Location: [facility];  Service: Cardiovascular;  Laterality: N/A;  . CARDIOVERSION N/A 09/05/2018   Procedure: CARDIOVERSION;  Surgeon: Minna Merritts, MD;  Location: [facility];  Service: Cardiovascular;  Laterality: N/A;  . CARDIOVERSION N/A 11/24/2018   Procedure: CARDIOVERSION (CATH LAB);  Surgeon: Wellington Hampshire, MD;  Location: [facility];  Service: Cardiovascular;  Laterality: N/A;  . CORONARY ANGIOPLASTY  03/16/2003   stent to the RCA  & 2/18/2004stent mid circumflex  . ELBOW SURGERY    . HERNIA REPAIR    . NASAL SINUS SURGERY    . PENILE PROSTHESIS IMPLANT  1995  . PILONIDAL CYST EXCISION    . TEE WITHOUT CARDIOVERSION N/A 04/24/2018   Procedure: TRANSESOPHAGEAL ECHOCARDIOGRAM (TEE);  Surgeon: Nelva Bush, MD;  Location: [facility];  Service: Cardiovascular;  Laterality: N/A;  . TONSILECTOMY, ADENOIDECTOMY, BILATERAL MYRINGOTOMY AND TUBES    . UMBILICAL HERNIA REPAIR      Medical History: Past Medical History:  Diagnosis Date  . AAA (abdominal aortic aneurysm) ([place])    a.  Status post endovascular repair at [facility] in 2016  . Anxiety   . Asthma   . Atrial flutter ([place])     a. diagnosed 7/19; b. CHADS2VASc => 5 (CHF, HTN, age x 1, DM, vascular disease); c. on Eliquis; d. s/p TEE/DCCV 8/19 with repeat DCCV 12/19  . Chronic diastolic CHF (congestive heart failure) ([place])   . COPD (chronic obstructive pulmonary disease) ([place])   . Coronary artery disease    a. LHC 5/10: left main 10%, mLAD-1 lesion 20%, mLAD-2 lesion 20%, D1 20%, mLCx-1 lesion 30%, mLCx-2 lesion 50%, OM1 20%, OM to 20%, LPL 20%, pRCA-1 lesion 20%, pRCA-2 lesion 20%.  EF 51% w/ mild inf HK  . Diabetes mellitus with complication ([place])   . Hyperlipidemia   . Hypertension   . Iliac artery aneurysm, bilateral ([place])   . Obstructive sleep apnea-hypopnea syndrome     Family History: Family History  Problem Relation Age of  Onset  . Hypertension Mother   . Hyperlipidemia Mother   . Depression Mother   . Heart attack Father 34  . Heart disease Father   . Hypertension Father   . Hyperlipidemia Father     Social History   Socioeconomic History  . Marital status: Divorced    Spouse name: Not on file  . Number of children: 2  . Years of education: 10   . Highest education level: 10th grade  Occupational History  . Occupation: disabled   Social Needs  . Financial resource strain: Somewhat hard  . Food insecurity    Worry: Never true    Inability: Never true  . Transportation needs    Medical: No    Non-medical: No  Tobacco Use  . Smoking status: Current Every Day Smoker    Packs/day: 0.25    Years: 45.00    Pack years: 11.25    Types: Cigarettes  . Smokeless tobacco: Former Systems developer    Types: Chew  . Tobacco comment: pt hasnt smoked lately due to health but states he still smokes  Substance and Sexual Activity  . Alcohol use: Not Currently  . Drug use: No  . Sexual activity: Not on file  Lifestyle  . Physical activity    Days per week: 7 days    Minutes per session: 10 min  . Stress: Only a little  Relationships  . Social connections    Talks on phone: More than three times a  week    Gets together: More than three times a week    Attends religious service: Never    Active member of club or organization: No    Attends meetings of clubs or organizations: Not on file    Relationship status: Divorced  . Intimate partner violence    Fear of current or ex partner: Not on file    Emotionally abused: Not on file    Physically abused: Not on file    Forced sexual activity: Not on file  Other Topics Concern  . Not on file  Social History Narrative  . Not on file      Review of Systems  Constitutional: Positive for fatigue. Negative for activity change and unexpected weight change.  HENT: Negative for postnasal drip, rhinorrhea, sore throat and voice change.        Chronic hoarseness  Respiratory: Positive for cough and shortness of breath. Negative for wheezing.        Intermittent wheezing.  Cardiovascular: Negative for chest pain and palpitations.  Gastrointestinal: Negative for constipation, diarrhea, nausea and vomiting.       Decreased appetite  Endocrine: Negative for cold intolerance, heat intolerance, polydipsia and polyuria.       States that he has been checking his blood sugars more often. Generally running around 120 or so.   Musculoskeletal: Positive for arthralgias, back pain and myalgias.  Skin: Negative for rash.  Allergic/Immunologic: Positive for environmental allergies.  Neurological: Positive for headaches.  Hematological: Negative for adenopathy.  Psychiatric/Behavioral: Positive for dysphoric mood. The patient is nervous/anxious.     Today's Vitals   03/05/19 1159  BP: 121/65  Pulse: 75  Weight: 168 lb (76.2 kg)  Height: _0  (1.778 m)   Body mass index is 24.11 kg/m.  Observation/Objective:  The patient is alert and oriented. He is pleasant and answering all questions appropriately. Breathing is non-labored. He is in no acute distress.    Assessment/Plan:  1. Diabetes mellitus without complication ([place]) Blood  sugars  stable. Will check HgbA1c at next visit.   2. Essential hypertension Continue bp medication as prescribed   3. Atrial fibrillation, unspecified type [facility]) Continue regular visits with cardiology as scheudled   4. Nicotine dependence with current use Discussed importance of smoking cessation.   5. Hoarseness of voice Likely from combination of allergies and smoking. Will evaluate further at next, in office visit.   General Counseling: [place] verbalizes understanding of the findings of today's phone visit and agrees with plan of treatment. I have discussed any further diagnostic evaluation that may be needed or ordered today. We also reviewed his medications today. he has been encouraged to call the office with any questions or concerns that should arise related to todays visit.   Diabetes Counseling:  1. Addition of ACE inh/ ARB'S for nephroprotection. Microalbumin is updated  2. Diabetic foot care, prevention of complications. Podiatry consult 3. Exercise and lose weight.  4. Diabetic eye examination, Diabetic eye exam is updated  5. Monitor blood sugar closlely. nutrition counseling.  6. Sign and symptoms of hypoglycemia including shaking sweating,confusion and headaches.  This patient was seen by [name] FNP Collaboration with Dr Lavera Guise as a part of collaborative care agreement  Time spent: 25 Minutes    Dr Lavera Guise Internal medicine

## 2019-03-10 ENCOUNTER — Ambulatory Visit (INDEPENDENT_AMBULATORY_CARE_PROVIDER_SITE_OTHER): Payer: Medicare HMO | Admitting: Adult Health

## 2019-03-10 ENCOUNTER — Other Ambulatory Visit: Payer: Self-pay

## 2019-03-10 ENCOUNTER — Encounter: Payer: Self-pay | Admitting: Adult Health

## 2019-03-10 VITALS — BP 104/75 | HR 76 | Ht 69.0 in | Wt 269.0 lb

## 2019-03-10 DIAGNOSIS — F17200 Nicotine dependence, unspecified, uncomplicated: Secondary | ICD-10-CM

## 2019-03-10 DIAGNOSIS — J0110 Acute frontal sinusitis, unspecified: Secondary | ICD-10-CM | POA: Diagnosis not present

## 2019-03-10 DIAGNOSIS — I10 Essential (primary) hypertension: Secondary | ICD-10-CM | POA: Diagnosis not present

## 2019-03-10 MED ORDER — AMOXICILLIN-POT CLAVULANATE 875-125 MG PO TABS: 1.0000 | ORAL_TABLET | Freq: Two times a day (BID) | ORAL | 0 refills | Status: DC

## 2019-03-10 NOTE — Patient Instructions (Signed)

## 2019-03-10 NOTE — Progress Notes (Signed)
[facility] [place], [place] [address]  Internal MEDICINE  Telephone Visit  Patient Name: Lawrence Steele  [id number]  [id number]  Date of Service: 03/10/2019  I connected with the patient at 1120 by telephone and verified the patients identity using two identifiers.  I discussed the limitations, risks, security and privacy concerns of performing an evaluation and management service by telephone and the availability of in person appointments. I also discussed with the patient that there may be a patient responsible charge related to the service.  The patient expressed understanding and agrees to proceed.    Chief Complaint  Patient presents with  . Telephone Assessment  . Telephone Screen  . Cough    green mucus   . Sinusitis    HPI  Pt seen via telephone.  PT reports coughing and PND x 4 days. He also reports facial pain and pressure.  He is taking a daily allergy medication, and using saline nasal spary without relief.  He denies any fever or other symptoms currently.    Current Medication: Outpatient Encounter Medications as of 03/10/2019  Medication Sig  . Accu-Chek Softclix Lancets lancets Use as instructed  Once a daily E11.65  . acetaminophen (TYLENOL) 500 MG tablet Take 500 mg by mouth every 6 (six) hours as needed.   Marland Kitchen albuterol (PROAIR HFA) 108 (90 Base) MCG/ACT inhaler Inhale 2 puffs into the lungs every 6 (six) hours as needed.  Marland Kitchen allopurinol (ZYLOPRIM) 100 MG tablet Take 1 tablet (100 mg total) by mouth 2 (two) times daily.  Marland Kitchen apixaban (ELIQUIS) 5 MG TABS tablet Take 1 tablet (5 mg total) by mouth 2 (two) times daily.  Marland Kitchen atenolol (TENORMIN) 50 MG tablet Take 1 tablet (50 mg total) by mouth daily.  Marland Kitchen atorvastatin (LIPITOR) 20 MG tablet Take 1 tablet by mouth once daily  . bisacodyl (DULCOLAX) 5 MG EC tablet Take 1 tablet (5 mg total) by mouth daily as needed for moderate constipation.  . Blood Glucose Monitoring Suppl (ACCU-CHEK AVIVA PLUS)  w/Device KIT Use as directed  . cyclobenzaprine (FLEXERIL) 10 MG tablet Take 1 tablet (10 mg total) by mouth 2 (two) times daily at 10 AM and 5 PM.  . dicyclomine (BENTYL) 10 MG capsule Take 1 capsule (10 mg total) by mouth 4 (four) times daily -  before meals and at bedtime. (Patient taking differently: Take 10 mg by mouth daily. )  . escitalopram (LEXAPRO) 5 MG tablet Take 1 tablet (5 mg total) by mouth daily.  . Fluticasone-Salmeterol (ADVAIR) 250-50 MCG/DOSE AEPB Inhale 1 puff into the lungs 2 (two) times daily.  . furosemide (LASIX) 20 MG tablet Take 2 tablets (40 mg) by mouth once every other day alternating with 1 tablet (20 mg) by mouth once every other day.  . gabapentin (NEURONTIN) 400 MG capsule Take 400 mg by mouth 4 (four) times daily.   Marland Kitchen glimepiride (AMARYL) 1 MG tablet Take 2 tablets (2 mg total) by mouth daily with supper.  Marland Kitchen glucose blood (ACCU-CHEK AVIVA PLUS) test strip 1 each by Other route as needed for other. Use as instructed  . glucose blood (ACCU-CHEK AVIVA PLUS) test strip Pt needs accu check Aviva plus lancets, test strips and meter to check blood sugars once daily  . HYDROcodone-acetaminophen (NORCO) 5-325 MG tablet Take 1 tablet by mouth 3 (three) times daily.   . hydroxypropyl methylcellulose / hypromellose (ISOPTO TEARS / GONIOVISC) 2.5 % ophthalmic solution Place 1 drop into both eyes daily as  needed for dry eyes.  Marland Kitchen ipratropium (ATROVENT) 0.02 % nebulizer solution Use every 6 hours for Shortness of breath and wheezing and as needed.  Prescription is for 90 days  . isosorbide mononitrate (IMDUR) 60 MG 24 hr tablet Take 1 tablet (60 mg) by mouth once daily at bedtime  . metFORMIN (GLUCOPHAGE) 500 MG tablet Take 1 tablet (500 mg total) by mouth 2 (two) times daily with a meal.  . nitroGLYCERIN (NITROSTAT) 0.4 MG SL tablet Place 0.4 mg under the tongue.  . OXYGEN Inhale 2 L into the lungs continuous.   Marland Kitchen tiotropium (SPIRIVA HANDIHALER) 18 MCG inhalation capsule Place 1  capsule (18 mcg total) into inhaler and inhale daily for 30 days.  . traZODone (DESYREL) 100 MG tablet Take 1 tablet (100 mg total) by mouth at bedtime.   No facility-administered encounter medications on file as of 03/10/2019.     Surgical History: Past Surgical History:  Procedure Laterality Date  . ABDOMINAL AORTIC ANEURYSM REPAIR  03/06/2015   [place]    . APPENDECTOMY    . CARDIAC CATHETERIZATION  2010,03/16/2003,11/04/2002  . CARDIOVERSION N/A 04/24/2018   Procedure: CARDIOVERSION;  Surgeon: Nelva Bush, MD;  Location: [facility];  Service: Cardiovascular;  Laterality: N/A;  . CARDIOVERSION N/A 09/05/2018   Procedure: CARDIOVERSION;  Surgeon: Minna Merritts, MD;  Location: [facility];  Service: Cardiovascular;  Laterality: N/A;  . CARDIOVERSION N/A 11/24/2018   Procedure: CARDIOVERSION (CATH LAB);  Surgeon: Wellington Hampshire, MD;  Location: [facility];  Service: Cardiovascular;  Laterality: N/A;  . CORONARY ANGIOPLASTY  03/16/2003   stent to the RCA  & 2/18/2004stent mid circumflex  . ELBOW SURGERY    . HERNIA REPAIR    . NASAL SINUS SURGERY    . PENILE PROSTHESIS IMPLANT  1995  . PILONIDAL CYST EXCISION    . TEE WITHOUT CARDIOVERSION N/A 04/24/2018   Procedure: TRANSESOPHAGEAL ECHOCARDIOGRAM (TEE);  Surgeon: Nelva Bush, MD;  Location: [facility];  Service: Cardiovascular;  Laterality: N/A;  . TONSILECTOMY, ADENOIDECTOMY, BILATERAL MYRINGOTOMY AND TUBES    . UMBILICAL HERNIA REPAIR      Medical History: Past Medical History:  Diagnosis Date  . AAA (abdominal aortic aneurysm) ([place])    a.  Status post endovascular repair at [facility] in 2016  . Anxiety   . Asthma   . Atrial flutter ([place])    a. diagnosed 7/19; b. CHADS2VASc => 5 (CHF, HTN, age x 1, DM, vascular disease); c. on Eliquis; d. s/p TEE/DCCV 8/19 with repeat DCCV 12/19  . Chronic diastolic CHF (congestive heart failure) ([place])   . COPD (chronic obstructive pulmonary disease) ([place])   . Coronary  artery disease    a. LHC 5/10: left main 10%, mLAD-1 lesion 20%, mLAD-2 lesion 20%, D1 20%, mLCx-1 lesion 30%, mLCx-2 lesion 50%, OM1 20%, OM to 20%, LPL 20%, pRCA-1 lesion 20%, pRCA-2 lesion 20%.  EF 51% w/ mild inf HK  . Diabetes mellitus with complication ([place])   . Hyperlipidemia   . Hypertension   . Iliac artery aneurysm, bilateral ([place])   . Obstructive sleep apnea-hypopnea syndrome     Family History: Family History  Problem Relation Age of Onset  . Hypertension Mother   . Hyperlipidemia Mother   . Depression Mother   . Heart attack Father 20  . Heart disease Father   . Hypertension Father   . Hyperlipidemia Father     Social History   Socioeconomic History  . Marital status: Divorced  Spouse name: Not on file  . Number of children: 2  . Years of education: 10   . Highest education level: 10th grade  Occupational History  . Occupation: disabled   Social Needs  . Financial resource strain: Somewhat hard  . Food insecurity    Worry: Never true    Inability: Never true  . Transportation needs    Medical: No    Non-medical: No  Tobacco Use  . Smoking status: Current Every Day Smoker    Packs/day: 0.25    Years: 45.00    Pack years: 11.25    Types: Cigarettes  . Smokeless tobacco: Former Systems developer    Types: Chew  . Tobacco comment: pt hasnt smoked lately due to health but states he still smokes  Substance and Sexual Activity  . Alcohol use: Not Currently  . Drug use: No  . Sexual activity: Not on file  Lifestyle  . Physical activity    Days per week: 7 days    Minutes per session: 10 min  . Stress: Only a little  Relationships  . Social connections    Talks on phone: More than three times a week    Gets together: More than three times a week    Attends religious service: Never    Active member of club or organization: No    Attends meetings of clubs or organizations: Not on file    Relationship status: Divorced  . Intimate partner violence    Fear of  current or ex partner: Not on file    Emotionally abused: Not on file    Physically abused: Not on file    Forced sexual activity: Not on file  Other Topics Concern  . Not on file  Social History Narrative  . Not on file      Review of Systems  Constitutional: Negative.  Negative for chills, fatigue and unexpected weight change.  HENT: Negative.  Negative for congestion, rhinorrhea, sneezing and sore throat.   Eyes: Negative for redness.  Respiratory: Negative.  Negative for cough, chest tightness and shortness of breath.   Cardiovascular: Negative.  Negative for chest pain and palpitations.  Gastrointestinal: Negative.  Negative for abdominal pain, constipation, diarrhea, nausea and vomiting.  Endocrine: Negative.   Genitourinary: Negative.  Negative for dysuria and frequency.  Musculoskeletal: Negative.  Negative for arthralgias, back pain, joint swelling and neck pain.  Skin: Negative.  Negative for rash.  Allergic/Immunologic: Negative.   Neurological: Negative.  Negative for tremors and numbness.  Hematological: Negative for adenopathy. Does not bruise/bleed easily.  Psychiatric/Behavioral: Negative.  Negative for behavioral problems, sleep disturbance and suicidal ideas. The patient is not nervous/anxious.     Vital Signs: BP 104/75   Pulse 76   Ht _0  (1.753 m)   Wt 269 lb (122 kg)   SpO2 97% Comment: 2 litre  BMI 39.72 kg/m    Observation/Objective: Pt sounds nasal on phone. Clearing his throat repeatedly.  NAD, speaking in full sentences.    Assessment/Plan: 1. Acute non-recurrent frontal sinusitis Advised patient to take entire course of antibiotics as prescribed with food. Pt should return to clinic in 7-10 days if symptoms fail to improve or new symptoms develop.  - amoxicillin-clavulanate (AUGMENTIN) 875-125 MG tablet; Take 1 tablet by mouth 2 (two) times daily.  Dispense: 14 tablet; Refill: 0  2. Essential hypertension Stable, continue present  management.   3. Nicotine dependence with current use Smoking cessation counseling: 1. Pt acknowledges the risks of long  term smoking, she will try to quite smoking. 2. Options for different medications including nicotine products, chewing gum, patch etc, Wellbutrin and Chantix is discussed 3. Goal and date of compete cessation is discussed 4. Total time spent in smoking cessation is 15 min.   General Counseling: [place] verbalizes understanding of the findings of today's phone visit and agrees with plan of treatment. I have discussed any further diagnostic evaluation that may be needed or ordered today. We also reviewed his medications today. he has been encouraged to call the office with any questions or concerns that should arise related to todays visit.    No orders of the defined types were placed in this encounter.   No orders of the defined types were placed in this encounter.   Time spent: [place] AGNP-C Internal medicine

## 2019-03-11 ENCOUNTER — Telehealth: Payer: Self-pay | Admitting: Cardiovascular Disease

## 2019-03-11 MED ORDER — DIGOXIN 125 MCG PO TABS: 0.1250 mg | ORAL_TABLET | Freq: Every day | ORAL | Status: DC

## 2019-03-11 NOTE — Telephone Encounter (Signed)
Patient c/o Palpitations:  High priority if patient c/o lightheadedness, shortness of breath, or chest pain  1) How long have you had palpitations/irregular HR/ Afib? Are you having the symptoms now? Noticed yesterday morning .  yes  2) Are you currently experiencing lightheadedness, SOB or CP? No   3) Do you have a history of afib (atrial fibrillation) or irregular heart rhythm? yes  4) Have you checked your BP or HR? (document readings if available): 72-140 HR  5) Are you experiencing any other symptoms? Can feel it

## 2019-03-11 NOTE — Telephone Encounter (Signed)
I spoke with the patient. He reports he woke up at 2:30 am yesterday morning and couldn't go back to sleep.  He checked his HR and it was 135-140 bpm.  He just checked hs HR about while on the phone with me and he was 137-139 bpm. He states an hour ago he was 88 bpm. The patient saw Dr. Caryl Comes on 02/17/19. I asked if he has noticed HR's that high since then and he advised they have ranged from 76-114 bpm. BP currently 106/80.  He reports he has digoxin at home and is wondering if he should take some of this. Per his report, Digoxin was stopped after his last DCCV.  I advised I would need to review with Dr. Caryl Comes and call him back.  He is agreeable.   I spoke with Dr. Caryl Comes regarding the above. He looked back at the patient's last EKG- he questions if the patient is having a different rhythm vs atrial flutter.  Per Dr. Caryl Comes, recommendations are to: 1) resume digoxin 0.125 mg- take 1 tablet every 12 hours x 3 doses, then take 1 tablet daily after that  2) take an extra atenolol 25 mg x 1 dose now.  3) have the patient come in for a nurse visit EKG tomorrow  Dr. Caryl Comes advised he is going to review the patient's EKG tracings with Dr. Rayann Heman to see if he may be more appropriate to take the patient to the lab for ablation.  I have notified the patient of the above recommendations and he is agreeable. He does have transportation issues, but will try to come to the clinic tomorrow at 10:30 am for a nurse visit EKG.       COVID-19 Pre-Screening Questions:  . In the past 7 to 10 days have you had a cough,  shortness of breath, headache, congestion, fever (100 or greater) body aches, chills, sore throat, or sudden loss of taste or sense of smell? NO . Have you been around anyone with known Covid 19. NO . Have you been around anyone who is awaiting Covid 19 test.  results in the past 7 to 10 days? NO . Have you been around anyone who has been exposed to Covid 19, or has mentioned symptoms of  Covid 19 within the past 7 to 10 days? NO  If you have any concerns/questions about symptoms patients report during screening (either on the phone or at threshold). Contact the provider seeing the patient or DOD for further guidance.  If neither are available contact a member of the leadership team.

## 2019-03-12 ENCOUNTER — Ambulatory Visit (INDEPENDENT_AMBULATORY_CARE_PROVIDER_SITE_OTHER): Payer: Medicare HMO

## 2019-03-12 ENCOUNTER — Telehealth: Payer: Self-pay | Admitting: Internal Medicine

## 2019-03-12 ENCOUNTER — Other Ambulatory Visit: Payer: Self-pay

## 2019-03-12 VITALS — BP 112/84 | HR 136 | Ht 69.5 in | Wt 270.5 lb

## 2019-03-12 DIAGNOSIS — Z01810 Encounter for preprocedural cardiovascular examination: Secondary | ICD-10-CM

## 2019-03-12 DIAGNOSIS — I483 Typical atrial flutter: Secondary | ICD-10-CM | POA: Diagnosis not present

## 2019-03-12 NOTE — Progress Notes (Signed)
The patient's EKG/ nurse visit notes were reviewed by Dr. Caryl Comes.  He states the patient is back in atrial flutter.  Recommendations are: 1) DCCV in the ER if the patient is symptomatic with his rhythm 2) DCCV next week as an outpatient is tolerating ok.  Per Dr. Caryl Comes, is rates are truly dipping in the 40's-50's consistently, he should hold digoxin.  I have notified the patient of the above recommendations.  He feels he is stable to wait until next week for DCCV. He is aware he will require a COVID-19 swab and is agreeable.  I advised I will work on arranging his DCCV and he will get a call back from me/ triage.

## 2019-03-12 NOTE — Progress Notes (Signed)
1.) Reason for visit: Triage call for tachycardia since 6/23 @ 2:30 AM. HR 48-140.  2.) Name of MD requesting visit: Dr. Caryl Comes  3.) H&P: Pt with hx of AAA w/o rupture, CAD, PVD, COPD (on [place] O2), DM, a fib/a flutter, prescribed digoxin and atenolol. Hx of multiple DCCV. Per triage note he was to resume digoxin with loading dose, then 1 tablet (0.125 mg total) once daily thereafter. He confirms that he has followed plan.  4.) ROS related to problem: He has SOB that he describes as a little worse than baseline COPD. He is not currently wearing o2 Apex. Sat 93%.   He denies chest pain, dizziness or periferal swelling.   He is up 1.8 lbs since 6/23.  EKG shows ventricular rate of 136.   EKG scanned and sent to Dr. Caryl Comes as requested.   5.) Assessment and plan per MD: Pt does not appear to be in acute distress. No respiratory distress noted. Pt moves around room independently.   A&O x 4.   Pt does note that he was recently prescribes abx for sinus infection but has not picked up prescription yet. He has been complaint with POC discussed with Nira Conn, RN. He denies fever, chills, exposure to the CV 19 virus.   Follow up with Dr. Caryl Comes as indicated based on EKG review.    Pt brought in HR values from 6/25 3 AM 141 4 AM 139 5 AM 122 6 AM 141 7 AM 96 8 AM 86 9 AM 49 10 AM 48 I am not aware of what type of device he uses at home to collect this data.

## 2019-03-12 NOTE — Patient Instructions (Signed)
Medication Instructions:  See below If you need a refill on your cardiac medications before your next appointment, please call your pharmacy.   Lab work: None ordered  If you have labs (blood work) drawn today and your tests are completely normal, you will receive your results only by: Marland Kitchen MyChart Message (if you have MyChart) OR . A paper copy in the mail If you have any lab test that is abnormal or we need to change your treatment, we will call you to review the results.  Testing/Procedures: None ordered   Follow-Up: At [facility], you and your health needs are our priority.  As part of our continuing mission to provide you with exceptional heart care, we have created designated Provider Care Teams.  These Care Teams include your primary Cardiologist (physician) and Advanced Practice Providers (APPs -  Physician Assistants and Nurse Practitioners) who all work together to provide you with the care you need, when you need it. You will need a follow up appointment as indicated by Dr. Caryl Comes.  Any Other Special Instructions Will Be Listed Below (If Applicable). Per phone note from [place], [place] "Dr. Caryl Comes, recommendations are to: 1) resume digoxin 0.125 mg- take 1 tablet every 12 hours x 3 doses, then take 1 tablet daily after that  2) take an extra atenolol 25 mg x 1 dose now.  3) have the patient come in for a nurse visit EKG tomorrow  Dr. Caryl Comes advised he is going to review the patient's EKG tracings with Dr. Rayann Heman to see if he may be more appropriate to take the patient to the lab for ablation."

## 2019-03-12 NOTE — Telephone Encounter (Signed)
Lawrence Filbert, RN at 03/12/2019 10:30 AM  Status: Signed    The patient's EKG/ nurse visit notes were reviewed by Dr. Caryl Steele.  He states the patient is back in atrial flutter.  Recommendations are: 1) DCCV in the ER if the patient is symptomatic with his rhythm 2) DCCV next week as an outpatient is tolerating ok.  Per Dr. Caryl Steele, is rates are truly dipping in the 40's-50's consistently, he should hold digoxin.  I have notified the patient of the above recommendations.  He feels he is stable to wait until next week for DCCV. He is aware he will require a COVID-19 swab and is agreeable.  I advised I will work on arranging his DCCV and he will get a call back from me/ triage

## 2019-03-12 NOTE — Telephone Encounter (Signed)
I attempted to call scheduling to arrange for a DCCV for Wednesday 03/18/19 with Dr. Rockey Situ.  Thursday is a cath conference. Friday is a holiday.  I have asked that scheduling call back to confirm as the patient would need to be added on for a COVID swab tomorrow.   Will forward to triage to assist if I do not hear back this afternoon.

## 2019-03-13 ENCOUNTER — Other Ambulatory Visit
Admission: RE | Admit: 2019-03-13 | Discharge: 2019-03-13 | Disposition: A | Discharge status: Home / Self Care | Payer: Medicare HMO | Source: Ambulatory Visit | Attending: Internal Medicine | Admitting: Internal Medicine

## 2019-03-13 ENCOUNTER — Other Ambulatory Visit: Payer: Self-pay | Admitting: Pharmacy Technician

## 2019-03-13 DIAGNOSIS — Z7901 Long term (current) use of anticoagulants: Secondary | ICD-10-CM | POA: Insufficient documentation

## 2019-03-13 DIAGNOSIS — Z01812 Encounter for preprocedural laboratory examination: Secondary | ICD-10-CM | POA: Insufficient documentation

## 2019-03-13 DIAGNOSIS — I4891 Unspecified atrial fibrillation: Secondary | ICD-10-CM | POA: Insufficient documentation

## 2019-03-13 DIAGNOSIS — Z1159 Encounter for screening for other viral diseases: Secondary | ICD-10-CM | POA: Diagnosis not present

## 2019-03-13 LAB — BASIC METABOLIC PANEL
Anion gap: 10 (ref 5–15)
BUN: 19 mg/dL (ref 8–23)
CO2: 23 mmol/L (ref 22–32)
Calcium: 8.7 mg/dL — ABNORMAL LOW (ref 8.9–10.3)
Chloride: 106 mmol/L (ref 98–111)
Creatinine, Ser: 0.94 mg/dL (ref 0.61–1.24)
GFR calc Af Amer: >60 mL/min (ref >60)
GFR calc non Af Amer: >60 mL/min (ref >60)
Glucose, Bld: 122 mg/dL — ABNORMAL HIGH (ref 70–99)
Potassium: 4.6 mmol/L (ref 3.5–5.1)
Sodium: 139 mmol/L (ref 135–145)

## 2019-03-13 LAB — CBC
HCT: 41.6 % (ref 39.0–52.0)
Hemoglobin: 13.8 g/dL (ref 13.0–17.0)
MCH: 35.1 pg — ABNORMAL HIGH (ref 26.0–34.0)
MCHC: 33.2 g/dL (ref 30.0–36.0)
MCV: 105.9 fL — ABNORMAL HIGH (ref 80.0–100.0)
Platelets: 150 10*3/uL (ref 150–400)
RBC: 3.93 MIL/uL — ABNORMAL LOW (ref 4.22–5.81)
RDW: 18.2 % — ABNORMAL HIGH (ref 11.5–15.5)
WBC: 8.1 10*3/uL (ref 4.0–10.5)
nRBC: 0 % (ref 0.0–0.2)

## 2019-03-13 NOTE — Patient Outreach (Addendum)
[facility]) Care Management  03/13/2019  Lawrence Steele May 01, 1951 [id number]   Incoming call from patient stating he received a letter from [employer] stating he had been denied for Spiriva. He states that if he if is unable to received the Spiriva his is open to going back to [employer] since he has been approved for program for Advair & Ventolin HFA.  Call placed to [employer] to verify the reason patient has been denied for program. [name] states patient is over income.  Reviewed patients co pay for medication to determine if patient is eligible for appeal and he does not meet the requirement as of yet.  Will route note to [place] to review med list and see if Incruse would be an option for patient.  Maud Deed Chana Bode [place] Certified Pharmacy Technician [place] Management Direct [phone]   Addendum:  Spiriva application denied.  Recommend requesting substitution to Incruse Ellipta as patient already approved for [place] for Ventolin and Advair and patient is familiar with Incruse as he used this medication in 2019.  [facility] pharmacy technician will fax request to provider.    Ralene Bathe, PharmD, [place] [phone]

## 2019-03-13 NOTE — Telephone Encounter (Signed)
Left message with Scheduling to put patient on for 7/1 for DCCV by Dr Rockey Situ and to call back if any issues or concerns.  Called patient with the following instructions:  You are scheduled for a Cardioversion on __07/01/2020______ with Dr.__Gollan__ Please arrive at the [place] [facility] at _6:30__ a.m. on the day of your procedure.  DIET INSTRUCTIONS:  Nothing to eat or drink after midnight except your medications with a sip of water. DO NOT TAKE Furosemide, metformin, glimepiride.       1) Labs: __TODAY (BMET, CBC)_____  2) Medications:  YOU MAY TAKE ALL of your remaining medications with a small amount of water.  3) Must have a responsible person to drive you home.  4) Bring a current list of your medications and current insurance cards.    Patient is aware he will need to go to the [place] drive up area for Covid testing and lab work.    Called [facility] testing site and left message to call back. I want to make sure they are aware of lab work as well.

## 2019-03-14 LAB — NOVEL CORONAVIRUS, NAA (HOSP ORDER, SEND-OUT TO REF LAB; TAT 18-24 HRS): SARS-CoV-2, NAA: NOT DETECTED

## 2019-03-16 ENCOUNTER — Other Ambulatory Visit: Payer: Self-pay | Admitting: Pharmacy Technician

## 2019-03-16 NOTE — Patient Outreach (Signed)
[facility]) Care Management  03/16/2019  Lawrence Steele 26-Aug-1951 [id number]   Faxed prescription request for Incruse Ellipta to [facility], [place]. Will submit to [place] patient assistance once document has been received.  Maud Deed Chana Bode [place] Certified Pharmacy Technician [place] Management Direct Dial:[phone]

## 2019-03-17 ENCOUNTER — Other Ambulatory Visit: Payer: Self-pay | Admitting: Cardiovascular Disease

## 2019-03-18 ENCOUNTER — Ambulatory Visit
Admission: RE | Admit: 2019-03-18 | Discharge: 2019-03-18 | Disposition: A | Discharge status: Home / Self Care | Payer: Medicare HMO | Attending: Cardiovascular Disease | Admitting: Cardiovascular Disease

## 2019-03-18 ENCOUNTER — Ambulatory Visit: Payer: Medicare HMO | Admitting: Certified Registered Nurse Anesthetist

## 2019-03-18 ENCOUNTER — Telehealth: Payer: Self-pay

## 2019-03-18 ENCOUNTER — Other Ambulatory Visit: Payer: Self-pay

## 2019-03-18 ENCOUNTER — Other Ambulatory Visit: Payer: Self-pay | Admitting: Pharmacy Technician

## 2019-03-18 ENCOUNTER — Encounter
Admission: RE | Disposition: A | Discharge status: Home / Self Care | Payer: Self-pay | Source: Home / Self Care | Attending: Cardiovascular Disease

## 2019-03-18 DIAGNOSIS — Z955 Presence of coronary angioplasty implant and graft: Secondary | ICD-10-CM | POA: Diagnosis not present

## 2019-03-18 DIAGNOSIS — E1151 Type 2 diabetes mellitus with diabetic peripheral angiopathy without gangrene: Secondary | ICD-10-CM | POA: Diagnosis not present

## 2019-03-18 DIAGNOSIS — I110 Hypertensive heart disease with heart failure: Secondary | ICD-10-CM | POA: Diagnosis not present

## 2019-03-18 DIAGNOSIS — G4733 Obstructive sleep apnea (adult) (pediatric): Secondary | ICD-10-CM | POA: Insufficient documentation

## 2019-03-18 DIAGNOSIS — Z7901 Long term (current) use of anticoagulants: Secondary | ICD-10-CM | POA: Diagnosis not present

## 2019-03-18 DIAGNOSIS — F17210 Nicotine dependence, cigarettes, uncomplicated: Secondary | ICD-10-CM | POA: Diagnosis not present

## 2019-03-18 DIAGNOSIS — Z79899 Other long term (current) drug therapy: Secondary | ICD-10-CM | POA: Diagnosis not present

## 2019-03-18 DIAGNOSIS — R0602 Shortness of breath: Secondary | ICD-10-CM | POA: Diagnosis not present

## 2019-03-18 DIAGNOSIS — Z7984 Long term (current) use of oral hypoglycemic drugs: Secondary | ICD-10-CM | POA: Insufficient documentation

## 2019-03-18 DIAGNOSIS — I483 Typical atrial flutter: Secondary | ICD-10-CM | POA: Insufficient documentation

## 2019-03-18 DIAGNOSIS — E785 Hyperlipidemia, unspecified: Secondary | ICD-10-CM | POA: Insufficient documentation

## 2019-03-18 DIAGNOSIS — E119 Type 2 diabetes mellitus without complications: Secondary | ICD-10-CM | POA: Diagnosis not present

## 2019-03-18 DIAGNOSIS — I5032 Chronic diastolic (congestive) heart failure: Secondary | ICD-10-CM | POA: Diagnosis not present

## 2019-03-18 DIAGNOSIS — I2510 Atherosclerotic heart disease of native coronary artery without angina pectoris: Secondary | ICD-10-CM | POA: Diagnosis not present

## 2019-03-18 DIAGNOSIS — J449 Chronic obstructive pulmonary disease, unspecified: Secondary | ICD-10-CM | POA: Insufficient documentation

## 2019-03-18 DIAGNOSIS — Z9981 Dependence on supplemental oxygen: Secondary | ICD-10-CM | POA: Insufficient documentation

## 2019-03-18 DIAGNOSIS — I4891 Unspecified atrial fibrillation: Secondary | ICD-10-CM | POA: Diagnosis not present

## 2019-03-18 DIAGNOSIS — J439 Emphysema, unspecified: Secondary | ICD-10-CM | POA: Diagnosis not present

## 2019-03-18 DIAGNOSIS — F411 Generalized anxiety disorder: Secondary | ICD-10-CM

## 2019-03-18 HISTORY — PX: CARDIOVERSION: SHX1299

## 2019-03-18 LAB — GLUCOSE, CAPILLARY: Glucose-Capillary: 135 mg/dL — ABNORMAL HIGH (ref 70–99)

## 2019-03-18 SURGERY — CARDIOVERSION
Anesthesia: General

## 2019-03-18 MED ORDER — PROPOFOL 10 MG/ML IV BOLUS
INTRAVENOUS | Status: DC | PRN
  Administered 2019-03-18: 60 mg via INTRAVENOUS
  Administered 2019-03-18: 20 mg via INTRAVENOUS

## 2019-03-18 MED ORDER — PHENYLEPHRINE HCL (PRESSORS) 10 MG/ML IV SOLN
INTRAVENOUS | Status: AC
  Filled 2019-03-18: qty 1

## 2019-03-18 MED ORDER — TRAZODONE HCL 100 MG PO TABS: 100.0000 mg | ORAL_TABLET | Freq: Every day | ORAL | 1 refills | Status: DC

## 2019-03-18 MED ORDER — SUCCINYLCHOLINE CHLORIDE 20 MG/ML IJ SOLN
INTRAMUSCULAR | Status: AC
  Filled 2019-03-18: qty 1

## 2019-03-18 MED ORDER — SODIUM CHLORIDE 0.9 % IV SOLN
INTRAVENOUS | Status: DC | PRN
  Administered 2019-03-18: 07:00:00 via INTRAVENOUS

## 2019-03-18 MED ORDER — EPHEDRINE SULFATE 50 MG/ML IJ SOLN
INTRAMUSCULAR | Status: AC
  Filled 2019-03-18: qty 1

## 2019-03-18 MED ORDER — SODIUM CHLORIDE (PF) 0.9 % IJ SOLN
INTRAMUSCULAR | Status: AC
  Filled 2019-03-18: qty 20

## 2019-03-18 MED ORDER — PROPOFOL 10 MG/ML IV BOLUS
INTRAVENOUS | Status: AC
  Filled 2019-03-18: qty 80

## 2019-03-18 NOTE — Anesthesia Preprocedure Evaluation (Signed)
Anesthesia Evaluation  Patient identified by MRN, date of birth, ID band Patient awake    Reviewed: Allergy & Precautions, H&P , NPO status , Patient's Chart, lab work & pertinent test results  History of Anesthesia Complications Negative for: history of anesthetic complications  Airway Mallampati: III  TM Distance: <3 FB Neck ROM: limited    Dental  (+) Chipped, Poor Dentition, Missing   Pulmonary asthma , sleep apnea, Continuous Positive Airway Pressure Ventilation and Oxygen sleep apnea , COPD,  COPD inhaler and oxygen dependent, Current Smoker,           Cardiovascular Exercise Tolerance: Good hypertension, (-) angina+ CAD, + Peripheral Vascular Disease, +CHF and + DOE  (-) Past MI + dysrhythmias Atrial Fibrillation      Neuro/Psych  Headaches, PSYCHIATRIC DISORDERS  Neuromuscular disease negative neurological ROS     GI/Hepatic negative GI ROS, Neg liver ROS, neg GERD  ,  Endo/Other  negative endocrine ROSdiabetes  Renal/GU negative Renal ROS  negative genitourinary   Musculoskeletal  (+) Arthritis ,   Abdominal   Peds  Hematology negative hematology ROS (+)   Anesthesia Other Findings Past Medical History: No date: AAA (abdominal aortic aneurysm) (HCC)     Comment:  a.  Status post endovascular repair at [facility] in 2016 No date: Anxiety No date: Asthma No date: Atrial flutter (HCC)     Comment:  a. diagnosed 7/19; b. CHADS2VASc => 5 (CHF, HTN, age x               1, DM, vascular disease); c. on Eliquis; d. s/p TEE/DCCV               8/19 with repeat DCCV 12/19 No date: Chronic diastolic CHF (congestive heart failure) (HCC) No date: COPD (chronic obstructive pulmonary disease) ([place]) No date: Coronary artery disease     Comment:  a. LHC 5/10: left main 10%, mLAD-1 lesion 20%, mLAD-2               lesion 20%, D1 20%, mLCx-1 lesion 30%, mLCx-2 lesion 50%,              OM1 20%, OM to 20%, LPL 20%, pRCA-1 lesion  20%, pRCA-2               lesion 20%.  EF 51% w/ mild inf HK No date: Diabetes mellitus with complication (HCC) No date: Hyperlipidemia No date: Hypertension No date: Iliac artery aneurysm, bilateral (HCC) No date: Obstructive sleep apnea-hypopnea syndrome  Past Surgical History: 03/06/2015: ABDOMINAL AORTIC ANEURYSM REPAIR     Comment:  [facility]  No date: ADENOIDECTOMY No date: APPENDECTOMY 2010,03/16/2003,11/04/2002: CARDIAC CATHETERIZATION 04/24/2018: CARDIOVERSION; N/A     Comment:  Procedure: CARDIOVERSION;  Surgeon: Nelva Bush,               MD;  Location: [facility];  Service: Cardiovascular;                Laterality: N/A; 09/05/2018: CARDIOVERSION; N/A     Comment:  Procedure: CARDIOVERSION;  Surgeon: Minna Merritts,               MD;  Location: [facility];  Service: Cardiovascular;                Laterality: N/A; 11/24/2018: CARDIOVERSION; N/A     Comment:  Procedure: CARDIOVERSION (CATH LAB);  Surgeon: Wellington Hampshire, MD;  Location:  [facility];  Service:               Cardiovascular;  Laterality: N/A; 03/16/2003: CORONARY ANGIOPLASTY     Comment:  stent to the RCA  & 2/18/2004stent mid circumflex No date: ELBOW SURGERY No date: HERNIA REPAIR No date: NASAL SINUS SURGERY 1995: PENILE PROSTHESIS IMPLANT No date: PILONIDAL CYST EXCISION 04/24/2018: TEE WITHOUT CARDIOVERSION; N/A     Comment:  Procedure: TRANSESOPHAGEAL ECHOCARDIOGRAM (TEE);                Surgeon: Nelva Bush, MD;  Location: [facility] OR[facility];                Service: Cardiovascular;  Laterality: N/A; No date: TONSILECTOMY, ADENOIDECTOMY, BILATERAL MYRINGOTOMY AND TUBES No date: UMBILICAL HERNIA REPAIR  BMI    Body Mass Index: 39.43 kg/m      Reproductive/Obstetrics negative OB ROS                             Anesthesia Physical Anesthesia Plan  ASA: IV  Anesthesia Plan: General   Post-op Pain Management:    Induction: Intravenous  PONV Risk Score and  Plan: Propofol infusion and TIVA  Airway Management Planned: Natural Airway and Nasal Cannula  Additional Equipment:   Intra-op Plan:   Post-operative Plan:   Informed Consent: I have reviewed the patients History and Physical, chart, labs and discussed the procedure including the risks, benefits and alternatives for the proposed anesthesia with the patient or authorized representative who has indicated his/her understanding and acceptance.     Dental Advisory Given  Plan Discussed with: Anesthesiologist, CRNA and Surgeon  Anesthesia Plan Comments: (Patient consented for risks of anesthesia including but not limited to:  - adverse reactions to medications - risk of intubation if required - damage to teeth, lips or other oral mucosa - sore throat or hoarseness - Damage to heart, brain, lungs or loss of life  Patient voiced understanding.)        Anesthesia Quick Evaluation

## 2019-03-18 NOTE — Telephone Encounter (Signed)
-----   Message from Adaline Sill, CPhT sent at 03/18/2019  2:11 PM EDT ----- Regarding: Fax Confirmation Hello!  I just wanted to confirm that you received the fax that I had sent requesting a script for patients Incruse Ellipta to send for patient assistance.  Thank you so much!  Maud Deed Chana Bode [place] Certified Pharmacy Technician [place] Management Direct [phone]

## 2019-03-18 NOTE — Patient Outreach (Addendum)
[facility]) Care Management  03/18/2019  Lawrence Steele Jul 25, 1951 [id number]    Incoming call from patient regarding patient assistance application(s) for [name] , HIPAA identifiers verified. Informed Lawrence Steele that we are waiting for provider to send the prescription in so that I can submit to [place]. Lawrence Steele confirms that he received his 3 month supplies of Advair and Ventolin HFA from [place] and Eliquis from [employer].  Sent I basket message to N. Patel to confirm she received the script request for Incruse. She confirmed that she did.  Received faxback and faxed into [place] for review.  Follow up:  Will follow up with company in 2-3 business days to check status.  Maud Deed Chana Bode [place] Certified Pharmacy Technician [place] Management Direct [phone]

## 2019-03-18 NOTE — Anesthesia Postprocedure Evaluation (Signed)
Anesthesia Post Note  Patient: Lawrence Steele  Procedure(s) Performed: CARDIOVERSION (N/A )  Patient location during evaluation: Cath Lab Anesthesia Type: General Level of consciousness: awake and alert Pain management: pain level controlled Vital Signs Assessment: post-procedure vital signs reviewed and stable Respiratory status: spontaneous breathing, nonlabored ventilation, respiratory function stable and patient connected to nasal cannula oxygen Cardiovascular status: blood pressure returned to baseline and stable Postop Assessment: no apparent nausea or vomiting Anesthetic complications: no     Last Vitals:  Vitals:   03/18/19 0800 03/18/19 0815  BP: 96/69 98/65  Pulse: 78 79  Resp: 16 18  Temp:    SpO2: 98% 96%    Last Pain:  Vitals:   03/18/19 0652  TempSrc: Oral  PainSc: 0-No pain                 Precious Haws Nathanyel Defenbaugh

## 2019-03-18 NOTE — Telephone Encounter (Signed)
Thn  Called that insurance not covered sprivia they covered incruse as per heather send new pres to thn for incruse

## 2019-03-18 NOTE — Transfer of Care (Signed)
Immediate Anesthesia Transfer of Care Note  Patient: Lawrence Steele  Procedure(s) Performed: CARDIOVERSION (N/A )  Patient Location: PACU and Cath Lab  Anesthesia Type:General  Level of Consciousness: awake and alert   Airway & Oxygen Therapy: Patient Spontanous Breathing and Patient connected to nasal cannula oxygen  Post-op Assessment: Report given to RN and Post -op Vital signs reviewed and stable  Post vital signs: Reviewed and stable  Last Vitals:  Vitals Value Taken Time  BP 121/79 03/18/19 0742  Temp    Pulse 74 03/18/19 0743  Resp 16 03/18/19 0743  SpO2 96 % 03/18/19 0743  Vitals shown include unvalidated device data.  Last Pain:  Vitals:   03/18/19 0652  TempSrc: Oral  PainSc: 0-No pain         Complications: No apparent anesthesia complications

## 2019-03-18 NOTE — Anesthesia Procedure Notes (Signed)
Performed by: Janeka Libman, CRNA Pre-anesthesia Checklist: Patient identified, Emergency Drugs available, Suction available, Patient being monitored and Timeout performed Patient Re-evaluated:Patient Re-evaluated prior to induction Oxygen Delivery Method: Nasal cannula Induction Type: IV induction       

## 2019-03-18 NOTE — Telephone Encounter (Signed)
Faxed thn incruse pres

## 2019-03-18 NOTE — H&P (Signed)
H&P Addendum, pre-cardioversion  Patient was seen and evaluated prior to -cardioversion procedure Symptoms, prior testing details again confirmed with the patient Patient examined, no significant change from prior exam Lab work reviewed in detail personally by myself Patient understands risk and benefit of the procedure, willing to proceed  Signed, Tim Gollan, MD, Ph.D [facility]  

## 2019-03-18 NOTE — Progress Notes (Signed)
Dr. Rockey Situ at bedside speaking with pt. Re: procedural results. Pt. Verbalizes understanding of conversation.

## 2019-03-18 NOTE — Anesthesia Post-op Follow-up Note (Signed)
Anesthesia QCDR form completed.        

## 2019-03-18 NOTE — CV Procedure (Signed)
Cardioversion procedure note For atrial flutter, typical  Procedure Details:  Consent: Risks of procedure as well as the alternatives and risks of each were explained to the (patient/caregiver). Consent for procedure obtained.  Time Out: Verified patient identification, verified procedure, site/side was marked, verified correct patient position, special equipment/implants available, medications/allergies/relevent history reviewed, required imaging and test results available. Performed  Patient placed on cardiac monitor, pulse oximetry, supplemental oxygen as necessary.  Sedation given: propofol IV, Dr. Amie Critchley Pacer pads placed anterior and posterior chest.   Cardioverted 1 time(s).  Cardioverted at  150J. Synchronized biphasic Converted to NSR   Evaluation: Findings: Post procedure EKG shows: NSR Complications: None Patient did tolerate procedure well.  Time Spent Directly with the Patient:  51 minutes   Esmond Plants, M.D., Ph.D.

## 2019-03-19 ENCOUNTER — Other Ambulatory Visit: Payer: Self-pay

## 2019-03-19 MED ORDER — INCRUSE ELLIPTA 62.5 MCG/INH IN AEPB: 1.0000 | INHALATION_SPRAY | Freq: Every day | RESPIRATORY_TRACT | 1 refills | Status: AC

## 2019-03-23 ENCOUNTER — Encounter: Payer: Self-pay | Admitting: Cardiovascular Disease

## 2019-03-25 ENCOUNTER — Other Ambulatory Visit: Payer: Self-pay | Admitting: Pharmacy Technician

## 2019-03-25 NOTE — Patient Outreach (Signed)
[facility]) Care Management  03/25/2019  KAITLYN SKOWRON May 03, 1951 [id number]    Follow up call placed to [place] regarding patient assistance application(s) for Incruse Ellipta , [place] confirms patient has been approved as of 7/8 until 09/17/19. Medication to arrive at patients home in 10-14 business days.  Successful outreach to patient, HIPAA identifiers verified. Informed patient of above details.  Follow up:  Will follow up with Mr. Benecke in 10-14 business days to confirm medication has been received.  Maud Deed Chana Bode [place] Certified Pharmacy Technician [place] Management Direct [phone]

## 2019-03-30 ENCOUNTER — Other Ambulatory Visit: Payer: Self-pay

## 2019-03-30 ENCOUNTER — Other Ambulatory Visit: Payer: Self-pay | Admitting: Physician Assistant

## 2019-03-30 MED ORDER — MELOXICAM 7.5 MG PO TABS: 7.5000 mg | ORAL_TABLET | Freq: Two times a day (BID) | ORAL | 1 refills | Status: DC

## 2019-03-30 MED ORDER — CYCLOBENZAPRINE HCL 10 MG PO TABS: 10.0000 mg | ORAL_TABLET | Freq: Two times a day (BID) | ORAL | 2 refills | Status: DC

## 2019-03-30 NOTE — Telephone Encounter (Signed)
Pt is taking Digoxin 125 mcg qd.  Please advise If ok to refill medication.

## 2019-03-30 NOTE — Telephone Encounter (Signed)
Per 03/12/19 Nurse visit with Marsh Dolly, RN  Per Dr. Caryl Comes, if rates are truly dipping in the 40's-50's consistently, he should hold digoxin.

## 2019-03-31 ENCOUNTER — Other Ambulatory Visit: Payer: Self-pay | Admitting: *Deleted

## 2019-04-06 ENCOUNTER — Encounter: Payer: Self-pay | Admitting: Cardiovascular Disease

## 2019-04-07 ENCOUNTER — Other Ambulatory Visit: Payer: Self-pay

## 2019-04-07 ENCOUNTER — Ambulatory Visit (INDEPENDENT_AMBULATORY_CARE_PROVIDER_SITE_OTHER): Payer: Medicare HMO | Admitting: Internal Medicine

## 2019-04-07 ENCOUNTER — Encounter: Payer: Self-pay | Admitting: Internal Medicine

## 2019-04-07 VITALS — BP 128/64 | HR 78 | Ht 70.0 in | Wt 272.0 lb

## 2019-04-07 DIAGNOSIS — I483 Typical atrial flutter: Secondary | ICD-10-CM | POA: Diagnosis not present

## 2019-04-07 NOTE — Progress Notes (Signed)
Patient Care Team: Ronnell Freshwater, NP as PCP - General (Family Medicine) Rockey Situ Kathlene November, MD as PCP - Cardiology (Cardiology) Ple[place], Eppie Gibson, RN as [place] Management Adaline Sill, CPhT as [place] Management (Pharmacy Technician) Rudean Haskell, [facility] as [place] (Pharmacist)   HPI  Lawrence Steele is a 68 y.o. male  Seen in follow-up for atrial flutter.  Initial visit with by telehealth 5/20. Recurrent tachypalpitations with shortness of breath and lightheadedness.  Anticoagulated with apixaban.  He has oxygen dependent COPD.  Followed by Dr. Chancy Milroy.  Following recent cardioversion felt better for a while.  Has felt sleepy over the last week.  2 things of note.  1-new CPAP machine, 2-Neurontin dose increased 25%.  DATE TEST EF   12/15 LHC  51 % CA Nonobstructive  2/18 Myoview 62% Non Ischemic  3/20 Echo  60-65% LA  (42/1.7/24)   Date Cr K Hgb  3/20 0.85 4.2 14.3         History of cough syncope.  ECG 7/19 Atrial flutter typical 2:1 block >>DCCV   ECG 12/19 Atrial flutter typical 2:1 AV block >> DCCV  I had reached out to Dr. Greggory Brandy regarding strips.  Unfortunately our messages crossed past just before he went on vacation.  I have scheduled time with him next Wednesday  Thromboembolic risk factors ( age -46  HTN-1 ,   DM-1 ) for a CHADSVASc Score of 3    Records and Results Reviewed   Past Medical History:  Diagnosis Date  . AAA (abdominal aortic aneurysm) ([place])    a.  Status post endovascular repair at [facility] in 2016  . Anxiety   . Asthma   . Atrial flutter ([place])    a. diagnosed 7/19; b. CHADS2VASc => 5 (CHF, HTN, age x 1, DM, vascular disease); c. on Eliquis; d. s/p TEE/DCCV 8/19 with repeat DCCV 12/19  . Chronic diastolic CHF (congestive heart failure) ([place])   . COPD (chronic obstructive pulmonary disease) ([place])   . Coronary artery disease    a. LHC 5/10: left main 10%,  mLAD-1 lesion 20%, mLAD-2 lesion 20%, D1 20%, mLCx-1 lesion 30%, mLCx-2 lesion 50%, OM1 20%, OM to 20%, LPL 20%, pRCA-1 lesion 20%, pRCA-2 lesion 20%.  EF 51% w/ mild inf HK  . Diabetes mellitus with complication ([place])   . Hyperlipidemia   . Hypertension   . Iliac artery aneurysm, bilateral ([place])   . Obstructive sleep apnea-hypopnea syndrome     Past Surgical History:  Procedure Laterality Date  . ABDOMINAL AORTIC ANEURYSM REPAIR  03/06/2015   [place]    . APPENDECTOMY    . CARDIAC CATHETERIZATION  2010,03/16/2003,11/04/2002  . CARDIOVERSION N/A 04/24/2018   Procedure: CARDIOVERSION;  Surgeon: Nelva Bush, MD;  Location: [facility];  Service: Cardiovascular;  Laterality: N/A;  . CARDIOVERSION N/A 09/05/2018   Procedure: CARDIOVERSION;  Surgeon: Minna Merritts, MD;  Location: [facility];  Service: Cardiovascular;  Laterality: N/A;  . CARDIOVERSION N/A 11/24/2018   Procedure: CARDIOVERSION (CATH LAB);  Surgeon: Wellington Hampshire, MD;  Location: [facility];  Service: Cardiovascular;  Laterality: N/A;  . CARDIOVERSION N/A 03/18/2019   Procedure: CARDIOVERSION;  Surgeon: Minna Merritts, MD;  Location: [facility];  Service: Cardiovascular;  Laterality: N/A;  . CORONARY ANGIOPLASTY  03/16/2003   stent to the RCA  & 2/18/2004stent mid circumflex  . ELBOW SURGERY    .  HERNIA REPAIR    . NASAL SINUS SURGERY    . PENILE PROSTHESIS IMPLANT  1995  . PILONIDAL CYST EXCISION    . TEE WITHOUT CARDIOVERSION N/A 04/24/2018   Procedure: TRANSESOPHAGEAL ECHOCARDIOGRAM (TEE);  Surgeon: Nelva Bush, MD;  Location: [facility];  Service: Cardiovascular;  Laterality: N/A;  . TONSILECTOMY, ADENOIDECTOMY, BILATERAL MYRINGOTOMY AND TUBES    . UMBILICAL HERNIA REPAIR      Current Meds  Medication Sig  . Accu-Chek Softclix Lancets lancets Use as instructed  Once a daily E11.65  . acetaminophen (TYLENOL) 650 MG CR tablet Take 1,300 mg by mouth every 8 (eight) hours as needed for pain.  Marland Kitchen  albuterol (PROAIR HFA) 108 (90 Base) MCG/ACT inhaler Inhale 2 puffs into the lungs every 6 (six) hours as needed. (Patient taking differently: Inhale 2 puffs into the lungs every 6 (six) hours as needed for wheezing or shortness of breath. )  . allopurinol (ZYLOPRIM) 100 MG tablet Take 1 tablet (100 mg total) by mouth 2 (two) times daily.  Marland Kitchen apixaban (ELIQUIS) 5 MG TABS tablet Take 1 tablet (5 mg total) by mouth 2 (two) times daily.  Marland Kitchen atenolol (TENORMIN) 25 MG tablet Take 25 mg by mouth 2 (two) times daily.  Marland Kitchen atorvastatin (LIPITOR) 20 MG tablet Take 1 tablet by mouth once daily (Patient taking differently: Take 20 mg by mouth every evening. )  . Blood Glucose Monitoring Suppl (ACCU-CHEK AVIVA PLUS) w/Device KIT Use as directed  . calcium carbonate (TUMS - DOSED IN MG ELEMENTAL CALCIUM) 500 MG chewable tablet Chew 1-2 tablets by mouth 3 (three) times daily as needed for indigestion or heartburn.  . cyclobenzaprine (FLEXERIL) 10 MG tablet Take 1 tablet (10 mg total) by mouth 2 (two) times daily at 10 AM and 5 PM.  . diclofenac sodium (VOLTAREN) 1 % GEL Apply 1 application topically 4 (four) times daily as needed for pain.  Marland Kitchen dicyclomine (BENTYL) 10 MG capsule Take 1 capsule (10 mg total) by mouth 4 (four) times daily -  before meals and at bedtime.  . digoxin (LANOXIN) 0.125 MG tablet Take 1 tablet (0.125 mg total) by mouth daily.  Marland Kitchen doxycycline (VIBRA-TABS) 100 MG tablet Take 100 mg by mouth daily.  Marland Kitchen escitalopram (LEXAPRO) 5 MG tablet Take 1 tablet (5 mg total) by mouth daily.  . Fluticasone-Salmeterol (ADVAIR) 250-50 MCG/DOSE AEPB Inhale 1 puff into the lungs 2 (two) times daily.  . furosemide (LASIX) 20 MG tablet Take 2 tablets (40 mg) by mouth once every other day alternating with 1 tablet (20 mg) by mouth once every other day. (Patient taking differently: Take 20-40 mg by mouth See admin instructions. Take 1 tablet (20 mg) by mouth scheduled daily, if patient experiencing swelling in legs will  increase to 2 tablets (40 mg) by mouth in the morning.)  . gabapentin (NEURONTIN) 400 MG capsule Take 400 mg by mouth 4 (four) times daily.   Marland Kitchen glimepiride (AMARYL) 1 MG tablet Take 2 tablets (2 mg total) by mouth daily with supper.  Marland Kitchen glucose blood (ACCU-CHEK AVIVA PLUS) test strip 1 each by Other route as needed for other. Use as instructed  . glucose blood (ACCU-CHEK AVIVA PLUS) test strip Pt needs accu check Aviva plus lancets, test strips and meter to check blood sugars once daily  . hydroxypropyl methylcellulose / hypromellose (ISOPTO TEARS / GONIOVISC) 2.5 % ophthalmic solution Place 1 drop into both eyes 4 (four) times daily as needed (eye irritation (sand like feeling)).   Marland Kitchen  ipratropium (ATROVENT) 0.02 % nebulizer solution Use every 6 hours for Shortness of breath and wheezing and as needed.  Prescription is for 90 days (Patient taking differently: Take 0.5 mg by nebulization every 6 (six) hours as needed for wheezing or shortness of breath. Use every 6 hours for Shortness of breath and wheezing and as needed.  Prescription is for 90 days)  . isosorbide mononitrate (IMDUR) 60 MG 24 hr tablet Take 1 tablet (60 mg) by mouth once daily at bedtime (Patient taking differently: Take 60 mg by mouth at bedtime. Take 1 tablet (60 mg) by mouth once daily at bedtime)  . loratadine (CLARITIN) 10 MG tablet Take 10 mg by mouth daily.  . meloxicam (MOBIC) 7.5 MG tablet Take 1 tablet (7.5 mg total) by mouth 2 (two) times a day.  . metFORMIN (GLUCOPHAGE) 500 MG tablet Take 1 tablet (500 mg total) by mouth 2 (two) times daily with a meal.  . nitroGLYCERIN (NITROSTAT) 0.4 MG SL tablet Place 0.4 mg under the tongue every 5 (five) minutes x 3 doses as needed for chest pain.   Marland Kitchen nystatin (MYCOSTATIN) 100000 UNIT/ML suspension Take 5 mLs by mouth 4 (four) times daily as needed (mouth/throat irritation.).   Marland Kitchen oxyCODONE-acetaminophen (PERCOCET) 10-325 MG tablet Take 1 tablet by mouth 3 (three) times daily.   . OXYGEN  Inhale 2 L into the lungs continuous.   . traZODone (DESYREL) 100 MG tablet Take 1 tablet (100 mg total) by mouth at bedtime.  Marland Kitchen umeclidinium bromide (INCRUSE ELLIPTA) 62.5 MCG/INH AEPB Inhale 1 puff into the lungs daily.    Allergies  Allergen Reactions  . Quinolones     Aneurysmal disease (History of AAA repair)      Review of Systems negative except from HPI and PMH  Physical Exam BP 128/64 (BP Location: Left Arm, Patient Position: Sitting, Cuff Size: Large)   Pulse 78   Ht _0  (1.778 m)   Wt 272 lb (123.4 kg)   SpO2 98%   BMI 39.03 kg/m  Well developed and nourished in no acute distress HENT normal Neck supple with JVP-  flat   Clear Regular rate and rhythm, no murmurs or gallops Abd-soft with active BS No Clubbing cyanosis edema Skin-warm and dry A & Oriented  Grossly normal sensory and motor function  ECG sinus/ectopic atrial focus @ 76 16/08/38 freq PACs    Assessment and  Plan  Atrial flutter recurrent  COPD oxygen dependent uses intermittently  Abdominal and Aortic aneurysmal disease  Morbidly obese  Quinolone therapy previous  Pain-chronic-opiate dependence  Gait instability  Fatigue and somnolence    Plan will be to proceed with catheter ablation.  Will review the strips with Dr. Greggory Brandy when he returns from vacation next week to consider strategy.  For now we will continue him on his anticoagulation.  Somnolence has worsened.  This is concurrent with an up titration of his Neurontin as well as a new CPAP machine.  He was going to decrease his Neurontin dose; I encouraged him to do so.  If his symptoms do not normalize, have encouraged him to follow-up with Dr. Humphrey Rolls regarding his CPAP  On Anticoagulation;  No bleeding issues   Will stop dig with sinus rhythm and normal LV function  We spent more than 50% of our >25 min visit in face to face counseling regarding the above    Current medicines are reviewed at length with the patient  today .  The patient does no have concerns  regarding medicines.

## 2019-04-07 NOTE — Patient Instructions (Signed)
Medication Instructions:  - Your physician has recommended you make the following change in your medication:   1) Stop digoxin  If you need a refill on your cardiac medications before your next appointment, please call your pharmacy.   Lab work: - none ordered  If you have labs (blood work) drawn today and your tests are completely normal, you will receive your results only by: Marland Kitchen MyChart Message (if you have MyChart) OR . A paper copy in the mail If you have any lab test that is abnormal or we need to change your treatment, we will call you to review the results.  Testing/Procedures: - none ordered  Follow-Up: At [facility], you and your health needs are our priority.  As part of our continuing mission to provide you with exceptional heart care, we have created designated Provider Care Teams.  These Care Teams include your primary Cardiologist (physician) and Advanced Practice Providers (APPs -  Physician Assistants and Nurse Practitioners) who all work together to provide you with the care you need, when you need it. . pending Dr. Olin Pia discussion with Dr. Rayann Heman  Any Other Special Instructions Will Be Listed Below (If Applicable). - N/A

## 2019-04-09 ENCOUNTER — Ambulatory Visit: Payer: Medicare HMO | Admitting: Internal Medicine

## 2019-04-10 ENCOUNTER — Other Ambulatory Visit: Payer: Self-pay | Admitting: Pharmacy Technician

## 2019-04-10 NOTE — Patient Outreach (Signed)
[facility]) Care Management  04/10/2019  Lawrence Steele [birth year] [id number]    Unsuccessful call #1 placed to patient regarding patient assistance medication receipt from [place], HIPAA compliant voicemail left.   Follow up:  Will make 2nd call attempt in 2-3 business days  Maud Deed. Chana Bode [place] Certified Pharmacy Technician [place] Management Direct Dial:[phone]

## 2019-04-13 ENCOUNTER — Encounter: Payer: Self-pay | Admitting: Nurse Practitioner

## 2019-04-13 ENCOUNTER — Other Ambulatory Visit: Payer: Self-pay

## 2019-04-13 ENCOUNTER — Ambulatory Visit (INDEPENDENT_AMBULATORY_CARE_PROVIDER_SITE_OTHER): Payer: Medicare HMO | Admitting: Nurse Practitioner

## 2019-04-13 VITALS — BP 122/73 | HR 78 | Resp 16 | Ht 70.0 in | Wt 270.0 lb

## 2019-04-13 DIAGNOSIS — I4891 Unspecified atrial fibrillation: Secondary | ICD-10-CM | POA: Diagnosis not present

## 2019-04-13 DIAGNOSIS — E1165 Type 2 diabetes mellitus with hyperglycemia: Secondary | ICD-10-CM | POA: Diagnosis not present

## 2019-04-13 DIAGNOSIS — I10 Essential (primary) hypertension: Secondary | ICD-10-CM

## 2019-04-13 LAB — POCT GLYCOSYLATED HEMOGLOBIN (HGB A1C): Hemoglobin A1C: 6.4 % — AB (ref 4.0–5.6)

## 2019-04-13 NOTE — Progress Notes (Signed)
[facility] [place], [place] [address]  Internal MEDICINE  Office Visit Note  Patient Name: Lawrence Steele  [id number]  [id number]  Date of Service: 04/13/2019  Chief Complaint  Patient presents with  . Anxiety  . Congestive Heart Failure  . COPD  . Diabetes    check hgba1c  . Hypertension  . Hyperlipidemia    The patient is here for routine follow up visit. He is talking about increased dizziness. This is worse with exertion. Has had another episode of severe a-fib when he needed to be cardioverted. This was about 3 to 4 weeks ago. Cardiology is in the process of changing around some of his medications to prevent flares of a fib and to improve dizziness. The patient has noted no improvements. Cardiology is discussing doing ablation to area in the heart which is causing so much trouble. Oxygen levels, blood pressure, and breathing are well controlled. Cardiology has recommend he go back to wearing his oxygen at all times, however he is currently not wearing it.       Current Medication: Outpatient Encounter Medications as of 04/13/2019  Medication Sig Note  . Accu-Chek Softclix Lancets lancets Use as instructed  Once a daily E11.65   . acetaminophen (TYLENOL) 650 MG CR tablet Take 1,300 mg by mouth every 8 (eight) hours as needed for pain.   Marland Kitchen albuterol (PROAIR HFA) 108 (90 Base) MCG/ACT inhaler Inhale 2 puffs into the lungs every 6 (six) hours as needed. (Patient taking differently: Inhale 2 puffs into the lungs every 6 (six) hours as needed for wheezing or shortness of breath. )   . allopurinol (ZYLOPRIM) 100 MG tablet Take 1 tablet (100 mg total) by mouth 2 (two) times daily.   Marland Kitchen apixaban (ELIQUIS) 5 MG TABS tablet Take 1 tablet (5 mg total) by mouth 2 (two) times daily.   Marland Kitchen atenolol (TENORMIN) 25 MG tablet Take 25 mg by mouth 2 (two) times daily.   Marland Kitchen atorvastatin (LIPITOR) 20 MG tablet Take 1 tablet by mouth once daily (Patient taking differently: Take  20 mg by mouth every evening. )   . Blood Glucose Monitoring Suppl (ACCU-CHEK AVIVA PLUS) w/Device KIT Use as directed   . calcium carbonate (TUMS - DOSED IN MG ELEMENTAL CALCIUM) 500 MG chewable tablet Chew 1-2 tablets by mouth 3 (three) times daily as needed for indigestion or heartburn.   . cyclobenzaprine (FLEXERIL) 10 MG tablet Take 1 tablet (10 mg total) by mouth 2 (two) times daily at 10 AM and 5 PM.   . diclofenac sodium (VOLTAREN) 1 % GEL Apply 1 application topically 4 (four) times daily as needed for pain.   Marland Kitchen dicyclomine (BENTYL) 10 MG capsule Take 1 capsule (10 mg total) by mouth 4 (four) times daily -  before meals and at bedtime.   Marland Kitchen doxycycline (VIBRA-TABS) 100 MG tablet Take 100 mg by mouth daily. 03/13/2019: Continuous therapy  . escitalopram (LEXAPRO) 5 MG tablet Take 1 tablet (5 mg total) by mouth daily.   . Fluticasone-Salmeterol (ADVAIR) 250-50 MCG/DOSE AEPB Inhale 1 puff into the lungs 2 (two) times daily.   . furosemide (LASIX) 20 MG tablet Take 2 tablets (40 mg) by mouth once every other day alternating with 1 tablet (20 mg) by mouth once every other day. (Patient taking differently: Take 20-40 mg by mouth See admin instructions. Take 1 tablet (20 mg) by mouth scheduled daily, if patient experiencing swelling in legs will increase to 2 tablets (40 mg)  by mouth in the morning.)   . gabapentin (NEURONTIN) 400 MG capsule Take 400 mg by mouth 4 (four) times daily.    Marland Kitchen glimepiride (AMARYL) 1 MG tablet Take 2 tablets (2 mg total) by mouth daily with supper.   Marland Kitchen glucose blood (ACCU-CHEK AVIVA PLUS) test strip 1 each by Other route as needed for other. Use as instructed   . glucose blood (ACCU-CHEK AVIVA PLUS) test strip Pt needs accu check Aviva plus lancets, test strips and meter to check blood sugars once daily   . hydroxypropyl methylcellulose / hypromellose (ISOPTO TEARS / GONIOVISC) 2.5 % ophthalmic solution Place 1 drop into both eyes 4 (four) times daily as needed (eye  irritation (sand like feeling)).    Marland Kitchen ipratropium (ATROVENT) 0.02 % nebulizer solution Use every 6 hours for Shortness of breath and wheezing and as needed.  Prescription is for 90 days (Patient taking differently: Take 0.5 mg by nebulization every 6 (six) hours as needed for wheezing or shortness of breath. Use every 6 hours for Shortness of breath and wheezing and as needed.  Prescription is for 90 days)   . isosorbide mononitrate (IMDUR) 60 MG 24 hr tablet Take 1 tablet (60 mg) by mouth once daily at bedtime (Patient taking differently: Take 60 mg by mouth at bedtime. Take 1 tablet (60 mg) by mouth once daily at bedtime)   . loratadine (CLARITIN) 10 MG tablet Take 10 mg by mouth daily.   . meloxicam (MOBIC) 7.5 MG tablet Take 1 tablet (7.5 mg total) by mouth 2 (two) times a day.   . metFORMIN (GLUCOPHAGE) 500 MG tablet Take 1 tablet (500 mg total) by mouth 2 (two) times daily with a meal.   . nitroGLYCERIN (NITROSTAT) 0.4 MG SL tablet Place 0.4 mg under the tongue every 5 (five) minutes x 3 doses as needed for chest pain.    Marland Kitchen nystatin (MYCOSTATIN) 100000 UNIT/ML suspension Take 5 mLs by mouth 4 (four) times daily as needed (mouth/throat irritation.).    Marland Kitchen oxyCODONE-acetaminophen (PERCOCET) 10-325 MG tablet Take 1 tablet by mouth 3 (three) times daily.    . OXYGEN Inhale 2 L into the lungs continuous.    . traZODone (DESYREL) 100 MG tablet Take 1 tablet (100 mg total) by mouth at bedtime.   Marland Kitchen umeclidinium bromide (INCRUSE ELLIPTA) 62.5 MCG/INH AEPB Inhale 1 puff into the lungs daily.    No facility-administered encounter medications on file as of 04/13/2019.     Surgical History: Past Surgical History:  Procedure Laterality Date  . ABDOMINAL AORTIC ANEURYSM REPAIR  03/06/2015   [place]    . APPENDECTOMY    . CARDIAC CATHETERIZATION  2010,03/16/2003,11/04/2002  . CARDIOVERSION N/A 04/24/2018   Procedure: CARDIOVERSION;  Surgeon: Nelva Bush, MD;  Location: [place] [facility];   Service: Cardiovascular;  Laterality: N/A;  . CARDIOVERSION N/A 09/05/2018   Procedure: CARDIOVERSION;  Surgeon: Minna Merritts, MD;  Location: [facility];  Service: Cardiovascular;  Laterality: N/A;  . CARDIOVERSION N/A 11/24/2018   Procedure: CARDIOVERSION (CATH LAB);  Surgeon: Wellington Hampshire, MD;  Location: [place] [facility];  Service: Cardiovascular;  Laterality: N/A;  . CARDIOVERSION N/A 03/18/2019   Procedure: CARDIOVERSION;  Surgeon: Minna Merritts, MD;  Location: [facility];  Service: Cardiovascular;  Laterality: N/A;  . CORONARY ANGIOPLASTY  03/16/2003   stent to the RCA  & 2/18/2004stent mid circumflex  . ELBOW SURGERY    . HERNIA REPAIR    . NASAL SINUS SURGERY    .  PENILE PROSTHESIS IMPLANT  1995  . PILONIDAL CYST EXCISION    . TEE WITHOUT CARDIOVERSION N/A 04/24/2018   Procedure: TRANSESOPHAGEAL ECHOCARDIOGRAM (TEE);  Surgeon: Nelva Bush, MD;  Location: [facility];  Service: Cardiovascular;  Laterality: N/A;  . TONSILECTOMY, ADENOIDECTOMY, BILATERAL MYRINGOTOMY AND TUBES    . UMBILICAL HERNIA REPAIR      Medical History: Past Medical History:  Diagnosis Date  . AAA (abdominal aortic aneurysm) ([place])    a.  Status post endovascular repair at [facility] in 2016  . Anxiety   . Asthma   . Atrial flutter ([place])    a. diagnosed 7/19; b. CHADS2VASc => 5 (CHF, HTN, age x 1, DM, vascular disease); c. on Eliquis; d. s/p TEE/DCCV 8/19 with repeat DCCV 12/19  . Chronic diastolic CHF (congestive heart failure) ([place])   . COPD (chronic obstructive pulmonary disease) ([place])   . Coronary artery disease    a. LHC 5/10: left main 10%, mLAD-1 lesion 20%, mLAD-2 lesion 20%, D1 20%, mLCx-1 lesion 30%, mLCx-2 lesion 50%, OM1 20%, OM to 20%, LPL 20%, pRCA-1 lesion 20%, pRCA-2 lesion 20%.  EF 51% w/ mild inf HK  . Diabetes mellitus with complication ([place])   . Hyperlipidemia   . Hypertension   . Iliac artery aneurysm, bilateral ([place])   . Obstructive sleep apnea-hypopnea syndrome     Family History: Family  History  Problem Relation Age of Onset  . Hypertension Mother   . Hyperlipidemia Mother   . Depression Mother   . Heart attack Father 34  . Heart disease Father   . Hypertension Father   . Hyperlipidemia Father     Social History   Socioeconomic History  . Marital status: Divorced    Spouse name: Not on file  . Number of children: 2  . Years of education: 10   . Highest education level: 10th grade  Occupational History  . Occupation: disabled   Social Needs  . Financial resource strain: Somewhat hard  . Food insecurity    Worry: Never true    Inability: Never true  . Transportation needs    Medical: No    Non-medical: No  Tobacco Use  . Smoking status: Current Every Day Smoker    Packs/day: 0.25    Years: 45.00    Pack years: 11.25    Types: Cigarettes  . Smokeless tobacco: Never Used  . Tobacco comment: pt hasnt smoked lately due to health but states he still smokes  Substance and Sexual Activity  . Alcohol use: Not Currently  . Drug use: No  . Sexual activity: Not on file  Lifestyle  . Physical activity    Days per week: 7 days    Minutes per session: 10 min  . Stress: Only a little  Relationships  . Social connections    Talks on phone: More than three times a week    Gets together: More than three times a week    Attends religious service: Never    Active member of club or organization: No    Attends meetings of clubs or organizations: Not on file    Relationship status: Divorced  . Intimate partner violence    Fear of current or ex partner: Not on file    Emotionally abused: Not on file    Physically abused: Not on file    Forced sexual activity: Not on file  Other Topics Concern  . Not on file  Social History Narrative  . Not on file  Review of Systems  Constitutional: Positive for fatigue. Negative for activity change and unexpected weight change.  HENT: Negative for postnasal drip, rhinorrhea, sore throat and voice change.         Chronic hoarseness  Respiratory: Positive for cough and shortness of breath. Negative for wheezing.        Intermittent wheezing.  Cardiovascular: Positive for palpitations. Negative for chest pain.       Has episodes of heart speeding up and slowing down. Causing dizziness.   Gastrointestinal: Negative for constipation, diarrhea, nausea and vomiting.       Decreased appetite  Endocrine: Negative for cold intolerance, heat intolerance, polydipsia and polyuria.       States that he has been checking his blood sugars more often. Generally running around 120 or so.   Musculoskeletal: Positive for arthralgias, back pain and myalgias.  Skin: Negative for rash.  Allergic/Immunologic: Positive for environmental allergies.  Neurological: Positive for headaches. Negative for seizures.  Hematological: Negative for adenopathy.  Psychiatric/Behavioral: Positive for dysphoric mood. The patient is nervous/anxious.     Today's Vitals   04/13/19 0924  BP: 122/73  Pulse: 78  Resp: 16  SpO2: 98%  Weight: 270 lb (122.5 kg)  Height: _0  (1.778 m)   Body mass index is 38.74 kg/m.  Physical Exam Vitals signs and nursing note reviewed.  Constitutional:      Appearance: Normal appearance. He is well-developed.  HENT:     Head: Normocephalic.     Nose: Nose normal.  Eyes:     Conjunctiva/sclera: Conjunctivae normal.     Pupils: Pupils are equal, round, and reactive to light.  Neck:     Musculoskeletal: Normal range of motion and neck supple.     Thyroid: No thyromegaly.     Vascular: No carotid bruit or JVD.     Trachea: No tracheal deviation.  Cardiovascular:     Rate and Rhythm: Normal rate. Rhythm irregular. Frequent extrasystoles are present.    Heart sounds: Murmur present. Systolic murmur present with a grade of 2/6.     Comments: Intermittent premature beat.  Pulmonary:     Effort: Pulmonary effort is normal. No accessory muscle usage or respiratory distress.     Breath sounds:  Examination of the right-upper field reveals rhonchi. Examination of the left-upper field reveals rhonchi. Examination of the right-middle field reveals rhonchi. Examination of the left-middle field reveals rhonchi. Wheezing and rhonchi present.  Abdominal:     General: Bowel sounds are normal.     Palpations: Abdomen is soft.     Tenderness: There is no abdominal tenderness.  Musculoskeletal: Normal range of motion.  Lymphadenopathy:     Cervical: No cervical adenopathy.  Skin:    General: Skin is warm and dry.  Neurological:     General: No focal deficit present.     Mental Status: He is alert and oriented to person, place, and time.  Psychiatric:        Mood and Affect: Mood is anxious and depressed.        Speech: Speech normal.        Behavior: Behavior normal.        Thought Content: Thought content normal.        Judgment: Judgment normal.    Assessment/Plan: 1. Type 2 diabetes mellitus with hyperglycemia, unspecified whether long term insulin use (HCC) - POCT HgB A1C 6.4 today. Continue diabetic medication as prescribed.   2. Atrial fibrillation, unspecified type ([place]) Likely cause of  intermittent dizzy spells. Should continue visits with cardiology as scheduled.   3. Essential hypertension Stable. Continue bp medication as prescribed   General Counseling: Offie verbalizes understanding of the findings of todays visit and agrees with plan of treatment. I have discussed any further diagnostic evaluation that may be needed or ordered today. We also reviewed his medications today. he has been encouraged to call the office with any questions or concerns that should arise related to todays visit.  Diabetes Counseling:  1. Addition of ACE inh/ ARB'S for nephroprotection. Microalbumin is updated  2. Diabetic foot care, prevention of complications. Podiatry consult 3. Exercise and lose weight.  4. Diabetic eye examination, Diabetic eye exam is updated  5. Monitor blood sugar  closlely. nutrition counseling.  6. Sign and symptoms of hypoglycemia including shaking sweating,confusion and headaches.  This patient was seen by [name] FNP Collaboration with Dr Lavera Guise as a part of collaborative care agreement  Orders Placed This Encounter  Procedures  . POCT HgB A1C      Time spent: 25 Minutes      Dr Lavera Guise Internal medicine

## 2019-04-14 NOTE — Addendum Note (Signed)
Addended by: Alba Destine on: 04/14/2019 07:30 AM   Modules accepted: Orders

## 2019-04-15 ENCOUNTER — Other Ambulatory Visit: Payer: Self-pay | Admitting: Pharmacy Technician

## 2019-04-15 DIAGNOSIS — E119 Type 2 diabetes mellitus without complications: Secondary | ICD-10-CM | POA: Diagnosis not present

## 2019-04-15 NOTE — Patient Outreach (Signed)
[facility]) Care Management  04/15/2019  FREDERICK MARRO 12-04-1950 [id number]   Received voicemail from patient stating that he received his Anoro Ellipta from [place].  Unsuccessful call placed to patient, left voicemail stating him that I received his voicemail. Requested that he contact me if he has any questions about obtaining refills.  Will route note to [place] for case closure.  Maud Deed Chana Bode [place] Certified Pharmacy Technician [place] Management Direct Dial:[phone]

## 2019-04-16 ENCOUNTER — Other Ambulatory Visit: Payer: Self-pay | Admitting: Pharmacist

## 2019-04-16 NOTE — Patient Outreach (Signed)
[facility]) Care Management  03/31/2019 Late entry  Lawrence Steele 1951/08/12 [id number]  [place] telephone call to patient.  [place] compliance verified. Per patient his fasting blood 134.  Patient A1C is 6.9. Patient stated that he is not exercising much since staying in the house mostly due to [place] -19. Patient stated that he uses a CPAP at night and uses a So Clean machine to keep the CPAP clean. The patient stated with it he is not having any problem sleeping with machine.  Patient stated he is taking his medications as prescribed. Patient daughter and son has been getting his groceries and running his errands.  Patient has agreed to follow up outreach call.   Current Medications:  Current Outpatient Medications  Medication Sig Dispense Refill  . Accu-Chek Softclix Lancets lancets Use as instructed  Once a daily E11.65 100 each 1  . acetaminophen (TYLENOL) 650 MG CR tablet Take 1,300 mg by mouth every 8 (eight) hours as needed for pain.    Marland Kitchen albuterol (PROAIR HFA) 108 (90 Base) MCG/ACT inhaler Inhale 2 puffs into the lungs every 6 (six) hours as needed. (Patient taking differently: Inhale 2 puffs into the lungs every 6 (six) hours as needed for wheezing or shortness of breath. ) 3 Inhaler 3  . allopurinol (ZYLOPRIM) 100 MG tablet Take 1 tablet (100 mg total) by mouth 2 (two) times daily. 180 tablet 0  . apixaban (ELIQUIS) 5 MG TABS tablet Take 1 tablet (5 mg total) by mouth 2 (two) times daily. 180 tablet 3  . atenolol (TENORMIN) 25 MG tablet Take 25 mg by mouth 2 (two) times daily.    Marland Kitchen atorvastatin (LIPITOR) 20 MG tablet Take 1 tablet by mouth once daily (Patient taking differently: Take 20 mg by mouth every evening. ) 90 tablet 0  . Blood Glucose Monitoring Suppl (ACCU-CHEK AVIVA PLUS) w/Device KIT Use as directed 1 kit 0  . calcium carbonate (TUMS - DOSED IN MG ELEMENTAL CALCIUM) 500 MG chewable tablet Chew 1-2 tablets by mouth 3 (three) times daily as needed  for indigestion or heartburn.    . cyclobenzaprine (FLEXERIL) 10 MG tablet Take 1 tablet (10 mg total) by mouth 2 (two) times daily at 10 AM and 5 PM. 60 tablet 2  . diclofenac sodium (VOLTAREN) 1 % GEL Apply 1 application topically 4 (four) times daily as needed for pain.    Marland Kitchen dicyclomine (BENTYL) 10 MG capsule Take 1 capsule (10 mg total) by mouth 4 (four) times daily -  before meals and at bedtime. 45 capsule 3  . doxycycline (VIBRA-TABS) 100 MG tablet Take 100 mg by mouth daily.    Marland Kitchen escitalopram (LEXAPRO) 5 MG tablet Take 1 tablet (5 mg total) by mouth daily. 30 tablet 3  . Fluticasone-Salmeterol (ADVAIR) 250-50 MCG/DOSE AEPB Inhale 1 puff into the lungs 2 (two) times daily. 60 each 4  . furosemide (LASIX) 20 MG tablet Take 2 tablets (40 mg) by mouth once every other day alternating with 1 tablet (20 mg) by mouth once every other day. (Patient taking differently: Take 20-40 mg by mouth See admin instructions. Take 1 tablet (20 mg) by mouth scheduled daily, if patient experiencing swelling in legs will increase to 2 tablets (40 mg) by mouth in the morning.) 135 tablet 1  . gabapentin (NEURONTIN) 400 MG capsule Take 400 mg by mouth 4 (four) times daily.     Marland Kitchen glimepiride (AMARYL) 1 MG tablet Take 2 tablets (2 mg total)  by mouth daily with supper. 180 tablet 1  . glucose blood (ACCU-CHEK AVIVA PLUS) test strip 1 each by Other route as needed for other. Use as instructed 100 each 3  . glucose blood (ACCU-CHEK AVIVA PLUS) test strip Pt needs accu check Aviva plus lancets, test strips and meter to check blood sugars once daily 100 each 12  . hydroxypropyl methylcellulose / hypromellose (ISOPTO TEARS / GONIOVISC) 2.5 % ophthalmic solution Place 1 drop into both eyes 4 (four) times daily as needed (eye irritation (sand like feeling)).     Marland Kitchen ipratropium (ATROVENT) 0.02 % nebulizer solution Use every 6 hours for Shortness of breath and wheezing and as needed.  Prescription is for 90 days (Patient taking  differently: Take 0.5 mg by nebulization every 6 (six) hours as needed for wheezing or shortness of breath. Use every 6 hours for Shortness of breath and wheezing and as needed.  Prescription is for 90 days) 900 mL 1  . isosorbide mononitrate (IMDUR) 60 MG 24 hr tablet Take 1 tablet (60 mg) by mouth once daily at bedtime (Patient taking differently: Take 60 mg by mouth at bedtime. Take 1 tablet (60 mg) by mouth once daily at bedtime)    . loratadine (CLARITIN) 10 MG tablet Take 10 mg by mouth daily.    . meloxicam (MOBIC) 7.5 MG tablet Take 1 tablet (7.5 mg total) by mouth 2 (two) times a day. 60 tablet 1  . metFORMIN (GLUCOPHAGE) 500 MG tablet Take 1 tablet (500 mg total) by mouth 2 (two) times daily with a meal. 180 tablet 1  . nitroGLYCERIN (NITROSTAT) 0.4 MG SL tablet Place 0.4 mg under the tongue every 5 (five) minutes x 3 doses as needed for chest pain.     Marland Kitchen nystatin (MYCOSTATIN) 100000 UNIT/ML suspension Take 5 mLs by mouth 4 (four) times daily as needed (mouth/throat irritation.).     Marland Kitchen oxyCODONE-acetaminophen (PERCOCET) 10-325 MG tablet Take 1 tablet by mouth 3 (three) times daily.     . OXYGEN Inhale 2 L into the lungs continuous.     . traZODone (DESYREL) 100 MG tablet Take 1 tablet (100 mg total) by mouth at bedtime. 90 tablet 1  . umeclidinium bromide (INCRUSE ELLIPTA) 62.5 MCG/INH AEPB Inhale 1 puff into the lungs daily. 1 each 1   No current facility-administered medications for this visit.     Functional Status:  In your present state of health, do you have any difficulty performing the following activities: 03/18/2019 12/30/2018  Hearing? N N  Vision? N N  Difficulty concentrating or making decisions? N N  Walking or climbing stairs? N Y  Comment - -  Dressing or bathing? N N  Doing errands, shopping? - Y  [place] and eating ? - N  Using the Toilet? - N  In the past six months, have you accidently leaked urine? - N  Do you have problems with loss of  bowel control? - N  Managing your Medications? - N  Managing your Finances? - Y  Comment - -  Housekeeping or managing your Housekeeping? - Y  Comment - -  Some recent data might be hidden    Fall/Depression Screening: Fall Risk  03/31/2019 03/05/2019 12/30/2018  Falls in the past year? 1 1 1   Comment - - -  Number falls in past yr: 0 0 0  Injury with Fall? 1 0 1  Comment - only bruising -  Risk for fall due to :  History of fall(s);Impaired balance/gait;Impaired mobility - History of fall(s);Impaired balance/gait;Impaired mobility  Follow up Falls evaluation completed - Falls evaluation completed   PHQ 2/9 Scores 03/31/2019 03/05/2019 12/30/2018 10/31/2018 09/30/2018 09/11/2018 07/22/2018  PHQ - 2 Score 0 0 0 0 0 0 0  PHQ- 9 Score - - - - - - -   THN CM Care Plan Problem One     Most Recent Value  Care Plan Problem One  Knowledge Deficit in Self Management of Diabetes  Role Documenting the Problem One  [place] for Problem One  Active  THN Long Term Goal   Patient will  Patient will see a decrease in A1C in A1C from 6.9 within the next 90 days  THN Long Term Goal Start Date  03/31/19  Interventions for Problem One Long Term Goal  RN reiterates healthy eating. RN discussed exercising. RN will follow up for further discussion and results  THN CM Short Term Goal #1   Patient will try to exercise on a regular basis within the next 30 days  Interventions for Short Term Goal #1  RN reiterates exercising.Rn has discusse chair exercising. RN will follow up with further discussion and encouragement  THN CM Short Term Goal #2   Patient will be able to identify foods that make up a healthy snack within the next 30 days  Interventions for Short Term Goal #2  Patient is eating the snack and food daughter helps provides for the patient. RN discussed the foods to avoid. RN will follow up with further discussion  THN CM Short Term Goal #4  Patient will have a better understanding of serving  size and portion control within the next 30 days  Interventions for Short Term Goal #4  RN reiterates portion control. RN will follow up with further discussion making patient aware to  use a smaller plate and eat small freq meals. RN will continue to reiterate with follow up discussions      Assessment:  A1C 6.9 FBS 134 Not exercising as much due to COVID-19 Patient uses CPAP machine [employer] is working on using portion control  Patient uses So Clean machine  to maintain cleanliness in  CPAP Plan:  RN reiterated portion control RN reiterated home exercising, chair exercises and stretching RN will follow up outreach in September  Rileigh Kawashima [place] Management [phone]

## 2019-04-16 NOTE — Patient Outreach (Signed)
[facility]) Care Management [place]  04/16/2019  Lawrence Steele [birth year] [id number]  Patient has been approved for Eliquis, Advair, Ventolin, and Incruse Ellipta patient assistance program(s).  Patient has been instructed on how to order refills and renewal process for 2020.    Plan: [place] case is being closed due to the following reasons: -Goals of care have been met. -Thank you for allowing [facility] pharmacy to be involved in this patient's care.    Ralene Bathe, PharmD, [place] [phone]

## 2019-04-21 ENCOUNTER — Encounter: Payer: Self-pay | Admitting: Nurse Practitioner

## 2019-05-01 ENCOUNTER — Other Ambulatory Visit: Payer: Self-pay

## 2019-05-01 DIAGNOSIS — K580 Irritable bowel syndrome with diarrhea: Secondary | ICD-10-CM

## 2019-05-01 MED ORDER — ALLOPURINOL 100 MG PO TABS: 100.0000 mg | ORAL_TABLET | Freq: Two times a day (BID) | ORAL | 1 refills | Status: DC

## 2019-05-01 MED ORDER — DICYCLOMINE HCL 10 MG PO CAPS: 10.0000 mg | ORAL_CAPSULE | Freq: Three times a day (TID) | ORAL | 3 refills | Status: DC

## 2019-05-06 ENCOUNTER — Other Ambulatory Visit: Payer: Self-pay

## 2019-05-06 DIAGNOSIS — J440 Chronic obstructive pulmonary disease with acute lower respiratory infection: Secondary | ICD-10-CM

## 2019-05-06 MED ORDER — IPRATROPIUM BROMIDE 0.02 % IN SOLN: RESPIRATORY_TRACT | 1 refills | Status: DC

## 2019-05-13 DIAGNOSIS — Z5181 Encounter for therapeutic drug level monitoring: Secondary | ICD-10-CM | POA: Diagnosis not present

## 2019-05-13 DIAGNOSIS — M545 Low back pain: Secondary | ICD-10-CM | POA: Diagnosis not present

## 2019-05-13 DIAGNOSIS — Z79899 Other long term (current) drug therapy: Secondary | ICD-10-CM | POA: Diagnosis not present

## 2019-05-21 ENCOUNTER — Telehealth: Payer: Self-pay

## 2019-05-21 NOTE — Telephone Encounter (Signed)
Put patient signed RX orders in [facility] box for pickup , Lawrence Steele

## 2019-05-29 ENCOUNTER — Other Ambulatory Visit: Payer: Self-pay

## 2019-05-29 DIAGNOSIS — E1165 Type 2 diabetes mellitus with hyperglycemia: Secondary | ICD-10-CM

## 2019-05-29 MED ORDER — GLIMEPIRIDE 1 MG PO TABS: 2.0000 mg | ORAL_TABLET | Freq: Every day | ORAL | 1 refills | Status: DC

## 2019-06-01 ENCOUNTER — Other Ambulatory Visit: Payer: Self-pay | Admitting: *Deleted

## 2019-06-01 NOTE — Patient Outreach (Signed)
[facility]) Care Management  06/01/2019  MANSOUR LOGGINS [birth year] [id number]   RN Health Coach attempted follow up outreach call to patient.  Patient was unavailable. HIPPA compliance voicemail message left with return callback number.  Plan: RN will call patient again within 30 days.  [place] Care Management [phone]

## 2019-06-08 DIAGNOSIS — G894 Chronic pain syndrome: Secondary | ICD-10-CM | POA: Diagnosis not present

## 2019-06-09 ENCOUNTER — Other Ambulatory Visit: Payer: Self-pay | Admitting: *Deleted

## 2019-06-09 NOTE — Patient Outreach (Signed)
Lawrence Steele) Care Management  06/09/2019   Lawrence Steele 10-19-1950 [id number]  RN Health Coach telephone call to patient.  Hipaa compliance verified. Per patient he went for his eye exam and have gotten new glasses. Patient A1C has decreased to 6.4 from 6.9 and 7.2 before that. . Patient has not been  Exercising.  He states that sometimes he feel a little dizzy. Patient has not had any falls since last outreach. Patient is trying to do better with portion control. Patient stated that he is using his CPAP. He states he is cleaning it well. Patient has agreed to follow up outreach calls.  Current Medications:  Current Outpatient Medications  Medication Sig Dispense Refill  . Accu-Chek Softclix Lancets lancets Use as instructed  Once a daily E11.65 100 each 1  . acetaminophen (TYLENOL) 650 MG CR tablet Take 1,300 mg by mouth every 8 (eight) hours as needed for pain.    Marland Kitchen albuterol (PROAIR HFA) 108 (90 Base) MCG/ACT inhaler Inhale 2 puffs into the lungs every 6 (six) hours as needed. (Patient taking differently: Inhale 2 puffs into the lungs every 6 (six) hours as needed for wheezing or shortness of breath. ) 3 Inhaler 3  . allopurinol (ZYLOPRIM) 100 MG tablet Take 1 tablet (100 mg total) by mouth 2 (two) times daily. 180 tablet 1  . apixaban (ELIQUIS) 5 MG TABS tablet Take 1 tablet (5 mg total) by mouth 2 (two) times daily. 180 tablet 3  . atenolol (TENORMIN) 25 MG tablet Take 25 mg by mouth 2 (two) times daily.    Marland Kitchen atorvastatin (LIPITOR) 20 MG tablet Take 1 tablet by mouth once daily (Patient taking differently: Take 20 mg by mouth every evening. ) 90 tablet 0  . Blood Glucose Monitoring Suppl (ACCU-CHEK AVIVA PLUS) w/Device KIT Use as directed 1 kit 0  . calcium carbonate (TUMS - DOSED IN MG ELEMENTAL CALCIUM) 500 MG chewable tablet Chew 1-2 tablets by mouth 3 (three) times daily as needed for indigestion or heartburn.    . cyclobenzaprine (FLEXERIL) 10 MG tablet Take 1  tablet (10 mg total) by mouth 2 (two) times daily at 10 AM and 5 PM. 60 tablet 2  . diclofenac sodium (VOLTAREN) 1 % GEL Apply 1 application topically 4 (four) times daily as needed for pain.    Marland Kitchen dicyclomine (BENTYL) 10 MG capsule Take 1 capsule (10 mg total) by mouth 4 (four) times daily -  before meals and at bedtime. 45 capsule 3  . doxycycline (VIBRA-TABS) 100 MG tablet Take 100 mg by mouth daily.    Marland Kitchen escitalopram (LEXAPRO) 5 MG tablet Take 1 tablet (5 mg total) by mouth daily. 30 tablet 3  . Fluticasone-Salmeterol (ADVAIR) 250-50 MCG/DOSE AEPB Inhale 1 puff into the lungs 2 (two) times daily. 60 each 4  . furosemide (LASIX) 20 MG tablet Take 2 tablets (40 mg) by mouth once every other day alternating with 1 tablet (20 mg) by mouth once every other day. (Patient taking differently: Take 20-40 mg by mouth See admin instructions. Take 1 tablet (20 mg) by mouth scheduled daily, if patient experiencing swelling in legs will increase to 2 tablets (40 mg) by mouth in the morning.) 135 tablet 1  . gabapentin (NEURONTIN) 400 MG capsule Take 400 mg by mouth 4 (four) times daily.     Marland Kitchen glimepiride (AMARYL) 1 MG tablet Take 2 tablets (2 mg total) by mouth daily with supper. 180 tablet 1  . glucose blood (ACCU-CHEK AVIVA  PLUS) test strip 1 each by Other route as needed for other. Use as instructed 100 each 3  . glucose blood (ACCU-CHEK AVIVA PLUS) test strip Pt needs accu check Aviva plus lancets, test strips and meter to check blood sugars once daily 100 each 12  . hydroxypropyl methylcellulose / hypromellose (ISOPTO TEARS / GONIOVISC) 2.5 % ophthalmic solution Place 1 drop into both eyes 4 (four) times daily as needed (eye irritation (sand like feeling)).     Marland Kitchen ipratropium (ATROVENT) 0.02 % nebulizer solution Use every 6 hours for Shortness of breath and wheezing and as needed.  Prescription is for 90 days 900 mL 1  . isosorbide mononitrate (IMDUR) 60 MG 24 hr tablet Take 1 tablet (60 mg) by mouth once  daily at bedtime (Patient taking differently: Take 60 mg by mouth at bedtime. Take 1 tablet (60 mg) by mouth once daily at bedtime)    . loratadine (CLARITIN) 10 MG tablet Take 10 mg by mouth daily.    . meloxicam (MOBIC) 7.5 MG tablet Take 1 tablet (7.5 mg total) by mouth 2 (two) times a day. 60 tablet 1  . metFORMIN (GLUCOPHAGE) 500 MG tablet Take 1 tablet (500 mg total) by mouth 2 (two) times daily with a meal. 180 tablet 1  . nitroGLYCERIN (NITROSTAT) 0.4 MG SL tablet Place 0.4 mg under the tongue every 5 (five) minutes x 3 doses as needed for chest pain.     Marland Kitchen nystatin (MYCOSTATIN) 100000 UNIT/ML suspension Take 5 mLs by mouth 4 (four) times daily as needed (mouth/throat irritation.).     Marland Kitchen oxyCODONE-acetaminophen (PERCOCET) 10-325 MG tablet Take 1 tablet by mouth 3 (three) times daily.     . OXYGEN Inhale 2 L into the lungs continuous.     . traZODone (DESYREL) 100 MG tablet Take 1 tablet (100 mg total) by mouth at bedtime. 90 tablet 1  . umeclidinium bromide (INCRUSE ELLIPTA) 62.5 MCG/INH AEPB Inhale 1 puff into the lungs daily. 1 each 1   No current facility-administered medications for this visit.     Functional Status:  In your present state of health, do you have any difficulty performing the following activities: 06/09/2019 03/18/2019  Hearing? N N  Vision? N N  Difficulty concentrating or making decisions? N N  Walking or climbing stairs? Y N  Comment shortness of breath on exertion -  Dressing or bathing? N N  Doing errands, shopping? Y -  Comment Daughter does errands -  Conservation officer, nature and eating ? N -  Using the Toilet? N -  In the past six months, have you accidently leaked urine? - -  Do you have problems with loss of bowel control? N -  Managing your Medications? N -  Managing your Finances? Y -  Comment family assists -  Housekeeping or managing your Housekeeping? Y -  Comment daughter assists -  Some recent data might be hidden    Fall/Depression Screening: Fall  Risk  06/09/2019 03/31/2019 03/05/2019  Falls in the past year? _0 Comment - - -  Number falls in past yr: 0 0 0  Injury with Fall? 1 1 0  Comment - - only bruising  Risk for fall due to : History of fall(s);Impaired balance/gait;Impaired mobility History of fall(s);Impaired balance/gait;Impaired mobility -  Follow up Falls evaluation completed;Falls prevention discussed Falls evaluation completed -   PHQ 2/9 Scores 06/09/2019 03/31/2019 03/05/2019 12/30/2018 10/31/2018 09/30/2018 09/11/2018  PHQ - 2 Score 0 0 0 0 0  0 0  PHQ- 9 Score - - - - - - -   THN CM Care Plan Problem One     Most Recent Value  Care Plan Problem One  Knowledge Deficit in Self Management of Diabetes  Role Documenting the Problem One  [place] for Problem One  Active  [facility] Long Term Goal   Patient will  Patient will see a decrease in A1C in A1C from 6.9 within the next 90 days  [facility] Long Term Goal Start Date  06/09/19  Interventions for Problem One Long Term Goal  Patient A1C is 6.4 but will continue to monitor for maintaining. RN reiterates portion control and healthy eating,RN discussed eating healthy snacks. RN will follo wup for further discussion and compliance  [facility] CM Short Term Goal #1   Patient will try to exercise on a regular basis within the next 30 days  Interventions for Short Term Goal #1  RN discussed exercising. Patient gets short of breath and some dizziness. Patient is following up with physician. RN will follow up further discussion  THN CM Short Term Goal #2   Patient will be able to verbalize following up with maintenance care within the next 90 days  THN CM Short Term Goal #2 Start Date  06/09/19  Interventions for Short Term Goal #2  RN discussed maintenance care. Patient has gotten eye exam. Patient needs to follow up for flu shot and podiatrist exam. RN will send a reminder and follow up with further discussion.      Assessment:  A1C 6.4 Patient went for eye exam Patient has new  eye glasses Patient is wearing CPAP Patient is taking medications as prescribed Patient is not exercising due to shortness of breath of moderate exertion and dizziness   Plan:  RN reiterated Health Maintenance Patient will follow up with flu shot Pt has been using pandemic safety precautions Patient will continue to use portion control and eat healthier RN will follow up outreach call within the month of December  [facility] [phone]

## 2019-06-22 ENCOUNTER — Telehealth: Payer: Self-pay

## 2019-06-22 NOTE — Telephone Encounter (Signed)
ORDER SIGNED AND PLACED IN APRIA FOLDER.

## 2019-06-26 ENCOUNTER — Other Ambulatory Visit: Payer: Self-pay

## 2019-06-26 MED ORDER — NYSTATIN 100000 UNIT/ML MT SUSP: 5.0000 mL | Freq: Four times a day (QID) | OROMUCOSAL | 1 refills | Status: DC | PRN

## 2019-06-29 DIAGNOSIS — R2241 Localized swelling, mass and lump, right lower limb: Secondary | ICD-10-CM | POA: Diagnosis not present

## 2019-06-30 DIAGNOSIS — S61301A Unspecified open wound of left index finger with damage to nail, initial encounter: Secondary | ICD-10-CM | POA: Diagnosis not present

## 2019-06-30 DIAGNOSIS — L98499 Non-pressure chronic ulcer of skin of other sites with unspecified severity: Secondary | ICD-10-CM | POA: Diagnosis not present

## 2019-07-01 ENCOUNTER — Ambulatory Visit: Payer: Self-pay | Admitting: Urology

## 2019-07-02 DIAGNOSIS — M79645 Pain in left finger(s): Secondary | ICD-10-CM | POA: Diagnosis not present

## 2019-07-02 DIAGNOSIS — C44629 Squamous cell carcinoma of skin of left upper limb, including shoulder: Secondary | ICD-10-CM | POA: Diagnosis not present

## 2019-07-02 DIAGNOSIS — S67191S Crushing injury of left index finger, sequela: Secondary | ICD-10-CM | POA: Diagnosis not present

## 2019-07-03 ENCOUNTER — Ambulatory Visit: Payer: Self-pay | Admitting: *Deleted

## 2019-07-04 ENCOUNTER — Other Ambulatory Visit: Payer: Self-pay | Admitting: Cardiovascular Disease

## 2019-07-06 ENCOUNTER — Other Ambulatory Visit: Payer: Self-pay | Admitting: Nurse Practitioner

## 2019-07-06 MED ORDER — ISOSORBIDE MONONITRATE ER 60 MG PO TB24: ORAL_TABLET | ORAL | Status: DC

## 2019-07-06 MED ORDER — CYCLOBENZAPRINE HCL 10 MG PO TABS: 10.0000 mg | ORAL_TABLET | Freq: Two times a day (BID) | ORAL | 2 refills | Status: DC

## 2019-07-08 DIAGNOSIS — G894 Chronic pain syndrome: Secondary | ICD-10-CM | POA: Diagnosis not present

## 2019-07-08 DIAGNOSIS — M545 Low back pain: Secondary | ICD-10-CM | POA: Diagnosis not present

## 2019-07-13 ENCOUNTER — Other Ambulatory Visit: Payer: Self-pay | Admitting: Nurse Practitioner

## 2019-07-13 MED ORDER — CYCLOBENZAPRINE HCL 10 MG PO TABS: 10.0000 mg | ORAL_TABLET | Freq: Two times a day (BID) | ORAL | 2 refills | Status: DC

## 2019-07-14 ENCOUNTER — Other Ambulatory Visit: Payer: Self-pay

## 2019-07-14 ENCOUNTER — Encounter: Payer: Self-pay | Admitting: Nurse Practitioner

## 2019-07-14 ENCOUNTER — Ambulatory Visit (INDEPENDENT_AMBULATORY_CARE_PROVIDER_SITE_OTHER): Payer: Medicare HMO | Admitting: Nurse Practitioner

## 2019-07-14 VITALS — BP 121/79 | HR 63 | Temp 97.4°F | Resp 16 | Ht 70.0 in | Wt 266.0 lb

## 2019-07-14 DIAGNOSIS — E1165 Type 2 diabetes mellitus with hyperglycemia: Secondary | ICD-10-CM

## 2019-07-14 DIAGNOSIS — Z23 Encounter for immunization: Secondary | ICD-10-CM | POA: Insufficient documentation

## 2019-07-14 DIAGNOSIS — I10 Essential (primary) hypertension: Secondary | ICD-10-CM

## 2019-07-14 DIAGNOSIS — I4891 Unspecified atrial fibrillation: Secondary | ICD-10-CM | POA: Diagnosis not present

## 2019-07-14 LAB — POCT GLYCOSYLATED HEMOGLOBIN (HGB A1C): Hemoglobin A1C: 6.6 % — AB (ref 4.0–5.6)

## 2019-07-14 NOTE — Progress Notes (Signed)
[facility] [place], [place] [address]  Internal MEDICINE  Office Visit Note  Patient Name: Lawrence Steele  [id number]  [id number]  Date of Service: 07/14/2019  Chief Complaint  Patient presents with  . Diabetes  . Hypertension  . Hyperlipidemia  . Quality Metric Gaps    diabetic eye exam     The patient is here for routine follow up of diabetes. HgbA1c 6.5 today. He states that he was recently diagnosed with skin cancer on the index finger of his left hand. Will be going to [facility] to have this officially removed. He is unsure of treatment plan at this point. States that this area has been present for many months. Finally went to skin doctor several weeks ago. Had biopsy last month and it was positive for cancer. He continues to have shortness of breath, especially with exertion. He states that he has had to have another cardioversion of his heart due to severe a-fib. Ablation procedure keeps getting postponed due to COVID 19.       Current Medication: Outpatient Encounter Medications as of 07/14/2019  Medication Sig Note  . Accu-Chek Softclix Lancets lancets Use as instructed  Once a daily E11.65   . acetaminophen (TYLENOL) 650 MG CR tablet Take 1,300 mg by mouth every 8 (eight) hours as needed for pain.   Marland Kitchen albuterol (PROAIR HFA) 108 (90 Base) MCG/ACT inhaler Inhale 2 puffs into the lungs every 6 (six) hours as needed. (Patient taking differently: Inhale 2 puffs into the lungs every 6 (six) hours as needed for wheezing or shortness of breath. )   . allopurinol (ZYLOPRIM) 100 MG tablet Take 1 tablet (100 mg total) by mouth 2 (two) times daily.   Marland Kitchen apixaban (ELIQUIS) 5 MG TABS tablet Take 1 tablet (5 mg total) by mouth 2 (two) times daily.   Marland Kitchen atenolol (TENORMIN) 25 MG tablet Take 1 tablet by mouth twice daily   . atorvastatin (LIPITOR) 20 MG tablet Take 1 tablet by mouth once daily (Patient taking differently: Take 20 mg by mouth every evening. )   . Blood  Glucose Monitoring Suppl (ACCU-CHEK AVIVA PLUS) w/Device KIT Use as directed   . calcium carbonate (TUMS - DOSED IN MG ELEMENTAL CALCIUM) 500 MG chewable tablet Chew 1-2 tablets by mouth 3 (three) times daily as needed for indigestion or heartburn.   . cyclobenzaprine (FLEXERIL) 10 MG tablet Take 1 tablet (10 mg total) by mouth 2 (two) times daily at 10 AM and 5 PM.   . diclofenac sodium (VOLTAREN) 1 % GEL Apply 1 application topically 4 (four) times daily as needed for pain.   Marland Kitchen dicyclomine (BENTYL) 10 MG capsule Take 1 capsule (10 mg total) by mouth 4 (four) times daily -  before meals and at bedtime.   Marland Kitchen doxycycline (VIBRA-TABS) 100 MG tablet Take 100 mg by mouth daily. 03/13/2019: Continuous therapy  . escitalopram (LEXAPRO) 5 MG tablet Take 1 tablet (5 mg total) by mouth daily.   . Fluticasone-Salmeterol (ADVAIR) 250-50 MCG/DOSE AEPB Inhale 1 puff into the lungs 2 (two) times daily.   . furosemide (LASIX) 20 MG tablet Take 2 tablets (40 mg) by mouth once every other day alternating with 1 tablet (20 mg) by mouth once every other day. (Patient taking differently: Take 20-40 mg by mouth See admin instructions. Take 1 tablet (20 mg) by mouth scheduled daily, if patient experiencing swelling in legs will increase to 2 tablets (40 mg) by mouth in the morning.)   .  gabapentin (NEURONTIN) 400 MG capsule Take 400 mg by mouth 4 (four) times daily.    Marland Kitchen glimepiride (AMARYL) 1 MG tablet Take 2 tablets (2 mg total) by mouth daily with supper.   Marland Kitchen glucose blood (ACCU-CHEK AVIVA PLUS) test strip 1 each by Other route as needed for other. Use as instructed   . glucose blood (ACCU-CHEK AVIVA PLUS) test strip Pt needs accu check Aviva plus lancets, test strips and meter to check blood sugars once daily   . hydroxypropyl methylcellulose / hypromellose (ISOPTO TEARS / GONIOVISC) 2.5 % ophthalmic solution Place 1 drop into both eyes 4 (four) times daily as needed (eye irritation (sand like feeling)).    Marland Kitchen ipratropium  (ATROVENT) 0.02 % nebulizer solution Use every 6 hours for Shortness of breath and wheezing and as needed.  Prescription is for 90 days   . isosorbide mononitrate (IMDUR) 60 MG 24 hr tablet Take 1 tablet (60 mg) by mouth once daily at bedtime   . loratadine (CLARITIN) 10 MG tablet Take 10 mg by mouth daily.   . meloxicam (MOBIC) 7.5 MG tablet Take 1 tablet (7.5 mg total) by mouth 2 (two) times a day.   . metFORMIN (GLUCOPHAGE) 500 MG tablet Take 1 tablet (500 mg total) by mouth 2 (two) times daily with a meal.   . nitroGLYCERIN (NITROSTAT) 0.4 MG SL tablet Place 0.4 mg under the tongue every 5 (five) minutes x 3 doses as needed for chest pain.    Marland Kitchen nystatin (MYCOSTATIN) 100000 UNIT/ML suspension Take 5 mLs (500,000 Units total) by mouth 4 (four) times daily as needed (mouth/throat irritation.).   Marland Kitchen oxyCODONE-acetaminophen (PERCOCET) 10-325 MG tablet Take 1 tablet by mouth 3 (three) times daily.    . OXYGEN Inhale 2 L into the lungs continuous.    . traZODone (DESYREL) 100 MG tablet Take 1 tablet (100 mg total) by mouth at bedtime.   Marland Kitchen umeclidinium bromide (INCRUSE ELLIPTA) 62.5 MCG/INH AEPB Inhale 1 puff into the lungs daily.    No facility-administered encounter medications on file as of 07/14/2019.     Surgical History: Past Surgical History:  Procedure Laterality Date  . ABDOMINAL AORTIC ANEURYSM REPAIR  03/06/2015   [place]    . APPENDECTOMY    . CARDIAC CATHETERIZATION  2010,03/16/2003,11/04/2002  . CARDIOVERSION N/A 04/24/2018   Procedure: CARDIOVERSION;  Surgeon: Nelva Bush, MD;  Location: [facility];  Service: Cardiovascular;  Laterality: N/A;  . CARDIOVERSION N/A 09/05/2018   Procedure: CARDIOVERSION;  Surgeon: Minna Merritts, MD;  Location: [facility];  Service: Cardiovascular;  Laterality: N/A;  . CARDIOVERSION N/A 11/24/2018   Procedure: CARDIOVERSION (CATH LAB);  Surgeon: Wellington Hampshire, MD;  Location: [facility];  Service: Cardiovascular;  Laterality:  N/A;  . CARDIOVERSION N/A 03/18/2019   Procedure: CARDIOVERSION;  Surgeon: Minna Merritts, MD;  Location: [facility];  Service: Cardiovascular;  Laterality: N/A;  . CORONARY ANGIOPLASTY  03/16/2003   stent to the RCA  & 2/18/2004stent mid circumflex  . ELBOW SURGERY    . HERNIA REPAIR    . NASAL SINUS SURGERY    . PENILE PROSTHESIS IMPLANT  1995  . PILONIDAL CYST EXCISION    . TEE WITHOUT CARDIOVERSION N/A 04/24/2018   Procedure: TRANSESOPHAGEAL ECHOCARDIOGRAM (TEE);  Surgeon: Nelva Bush, MD;  Location: [facility];  Service: Cardiovascular;  Laterality: N/A;  . TONSILECTOMY, ADENOIDECTOMY, BILATERAL MYRINGOTOMY AND TUBES    . UMBILICAL HERNIA REPAIR      Medical History: Past Medical  History:  Diagnosis Date  . AAA (abdominal aortic aneurysm) ([place])    a.  Status post endovascular repair at [facility] in 2016  . Anxiety   . Asthma   . Atrial flutter ([place])    a. diagnosed 7/19; b. CHADS2VASc => 5 (CHF, HTN, age x 1, DM, vascular disease); c. on Eliquis; d. s/p TEE/DCCV 8/19 with repeat DCCV 12/19  . Chronic diastolic CHF (congestive heart failure) ([place])   . COPD (chronic obstructive pulmonary disease) ([place])   . Coronary artery disease    a. LHC 5/10: left main 10%, mLAD-1 lesion 20%, mLAD-2 lesion 20%, D1 20%, mLCx-1 lesion 30%, mLCx-2 lesion 50%, OM1 20%, OM to 20%, LPL 20%, pRCA-1 lesion 20%, pRCA-2 lesion 20%.  EF 51% w/ mild inf HK  . Diabetes mellitus with complication ([place])   . Hyperlipidemia   . Hypertension   . Iliac artery aneurysm, bilateral ([place])   . Obstructive sleep apnea-hypopnea syndrome     Family History: Family History  Problem Relation Age of Onset  . Hypertension Mother   . Hyperlipidemia Mother   . Depression Mother   . Heart attack Father 34  . Heart disease Father   . Hypertension Father   . Hyperlipidemia Father     Social History   Socioeconomic History  . Marital status: Divorced    Spouse name: Not on file  . Number of children: 2  . Years of  education: 10   . Highest education level: 10th grade  Occupational History  . Occupation: disabled   Social Needs  . Financial resource strain: Somewhat hard  . Food insecurity    Worry: Never true    Inability: Never true  . Transportation needs    Medical: No    Non-medical: No  Tobacco Use  . Smoking status: Current Every Day Smoker    Packs/day: 0.25    Years: 45.00    Pack years: 11.25    Types: Cigarettes  . Smokeless tobacco: Never Used  . Tobacco comment: pt hasnt smoked lately due to health but states he still smokes  Substance and Sexual Activity  . Alcohol use: Not Currently  . Drug use: No  . Sexual activity: Not on file  Lifestyle  . Physical activity    Days per week: 7 days    Minutes per session: 10 min  . Stress: Only a little  Relationships  . Social connections    Talks on phone: More than three times a week    Gets together: More than three times a week    Attends religious service: Never    Active member of club or organization: No    Attends meetings of clubs or organizations: Not on file    Relationship status: Divorced  . Intimate partner violence    Fear of current or ex partner: Not on file    Emotionally abused: Not on file    Physically abused: Not on file    Forced sexual activity: Not on file  Other Topics Concern  . Not on file  Social History Narrative  . Not on file      Review of Systems  Constitutional: Positive for fatigue. Negative for activity change and unexpected weight change.  HENT: Negative for postnasal drip, rhinorrhea, sore throat and voice change.   Respiratory: Positive for cough, shortness of breath and wheezing.        Intermittent wheezing.  Cardiovascular: Positive for palpitations. Negative for chest pain.  Has episodes of heart speeding up and slowing down. Causing dizziness. Recently had another cardioversion. Procedure for ablation keeps being postponed due to COVID 19.   Gastrointestinal: Negative  for constipation, diarrhea, nausea and vomiting.       Decreased appetite  Endocrine: Negative for cold intolerance, heat intolerance, polydipsia and polyuria.       States that he has been checking his blood sugars more often. Generally running around 120 or so.   Musculoskeletal: Positive for arthralgias, back pain and myalgias.  Skin: Negative for rash.  Allergic/Immunologic: Positive for environmental allergies.  Neurological: Positive for headaches. Negative for seizures.  Hematological: Negative for adenopathy.  Psychiatric/Behavioral: Positive for dysphoric mood. The patient is nervous/anxious.     Today's Vitals   07/14/19 0932  BP: 121/79  Pulse: 63  Resp: 16  Temp: (!) 97.4 F (36.3 C)  SpO2: 94%  Weight: 266 lb (120.7 kg)  Height: 5' 10"  (1.778 m)   Body mass index is 38.17 kg/m.  Physical Exam Vitals signs and nursing note reviewed.  Constitutional:      Appearance: Normal appearance. He is well-developed.  HENT:     Head: Normocephalic.     Nose: Nose normal.  Eyes:     Conjunctiva/sclera: Conjunctivae normal.     Pupils: Pupils are equal, round, and reactive to light.  Neck:     Musculoskeletal: Normal range of motion and neck supple.     Thyroid: No thyromegaly.     Vascular: No carotid bruit or JVD.     Trachea: No tracheal deviation.  Cardiovascular:     Rate and Rhythm: Normal rate. Rhythm irregular. Frequent extrasystoles are present.    Heart sounds: Murmur present. Systolic murmur present with a grade of 2/6.     Comments: Intermittent premature beat.  Pulmonary:     Effort: Pulmonary effort is normal. No accessory muscle usage or respiratory distress.     Breath sounds: Examination of the right-upper field reveals rhonchi. Examination of the left-upper field reveals rhonchi. Examination of the right-middle field reveals rhonchi. Examination of the left-middle field reveals rhonchi. Wheezing and rhonchi present.  Abdominal:     General: Bowel  sounds are normal.     Palpations: Abdomen is soft.     Tenderness: There is no abdominal tenderness.  Musculoskeletal: Normal range of motion.  Lymphadenopathy:     Cervical: No cervical adenopathy.  Skin:    General: Skin is warm and dry.  Neurological:     General: No focal deficit present.     Mental Status: He is alert and oriented to person, place, and time.  Psychiatric:        Mood and Affect: Mood is anxious and depressed.        Speech: Speech normal.        Behavior: Behavior normal.        Thought Content: Thought content normal.        Judgment: Judgment normal.   Assessment/Plan:  1. Type 2 diabetes mellitus with hyperglycemia, without long-term current use of insulin (HCC) - POCT HgB A1C 6.6 today. Continue diabetic medication as prescribed  - Microalbumin, urine  2. Atrial fibrillation, unspecified type [facility]) Continue regular visits with cardiology as scheduled.   3. Essential hypertension Stable. Continue bp medication as prescribed   4. Flu vaccine need - Flu Vaccine MDCK QUAD PF    General Counseling: Joziyah verbalizes understanding of the findings of todays visit and agrees with plan of treatment. I have  discussed any further diagnostic evaluation that may be needed or ordered today. We also reviewed his medications today. he has been encouraged to call the office with any questions or concerns that should arise related to todays visit.  Diabetes Counseling:  1. Addition of ACE inh/ ARB'S for nephroprotection. Microalbumin is updated  2. Diabetic foot care, prevention of complications. Podiatry consult 3. Exercise and lose weight.  4. Diabetic eye examination, Diabetic eye exam is updated  5. Monitor blood sugar closlely. nutrition counseling.  6. Sign and symptoms of hypoglycemia including shaking sweating,confusion and headaches.   This patient was seen by [name] FNP Collaboration with Dr Lavera Guise as a part of collaborative care  agreement  Orders Placed This Encounter  Procedures  . Flu Vaccine MDCK QUAD PF  . Microalbumin, urine  . POCT HgB A1C     Time spent: 25 Minutes      Dr Lavera Guise Internal medicine

## 2019-07-15 LAB — MICROALBUMIN, URINE: Microalbumin, Urine: 248 ug/mL

## 2019-07-19 ENCOUNTER — Other Ambulatory Visit: Payer: Self-pay | Admitting: Cardiovascular Disease

## 2019-07-20 ENCOUNTER — Other Ambulatory Visit: Payer: Self-pay | Admitting: Nurse Practitioner

## 2019-07-20 MED ORDER — CYCLOBENZAPRINE HCL 10 MG PO TABS: 10.0000 mg | ORAL_TABLET | Freq: Two times a day (BID) | ORAL | 2 refills | Status: DC

## 2019-07-21 ENCOUNTER — Other Ambulatory Visit: Payer: Self-pay

## 2019-07-21 MED ORDER — ISOSORBIDE MONONITRATE ER 60 MG PO TB24: ORAL_TABLET | ORAL | 1 refills | Status: DC

## 2019-07-22 DIAGNOSIS — C44629 Squamous cell carcinoma of skin of left upper limb, including shoulder: Secondary | ICD-10-CM | POA: Diagnosis not present

## 2019-07-28 DIAGNOSIS — C44629 Squamous cell carcinoma of skin of left upper limb, including shoulder: Secondary | ICD-10-CM | POA: Diagnosis not present

## 2019-07-31 ENCOUNTER — Ambulatory Visit: Payer: Self-pay | Admitting: Urology

## 2019-08-05 ENCOUNTER — Other Ambulatory Visit: Payer: Self-pay

## 2019-08-05 DIAGNOSIS — F411 Generalized anxiety disorder: Secondary | ICD-10-CM

## 2019-08-05 MED ORDER — TRAZODONE HCL 100 MG PO TABS: 100.0000 mg | ORAL_TABLET | Freq: Every day | ORAL | 1 refills | Status: DC

## 2019-08-05 MED ORDER — METFORMIN HCL 500 MG PO TABS: 500.0000 mg | ORAL_TABLET | Freq: Two times a day (BID) | ORAL | 1 refills | Status: DC

## 2019-08-10 ENCOUNTER — Other Ambulatory Visit: Payer: Self-pay

## 2019-08-10 MED ORDER — DOXYCYCLINE HYCLATE 100 MG PO TABS: 100.0000 mg | ORAL_TABLET | Freq: Every day | ORAL | 3 refills | Status: DC

## 2019-08-11 DIAGNOSIS — R6889 Other general symptoms and signs: Secondary | ICD-10-CM | POA: Diagnosis not present

## 2019-08-16 IMAGING — DX DG CHEST 1V PORT
1 series · 1 of 1 positions shown · non-contrast
Comparison: 03/30/2017

CLINICAL DATA: Chest pain and shortness of breath.

EXAM:
PORTABLE CHEST 1 VIEW

[chest ap]
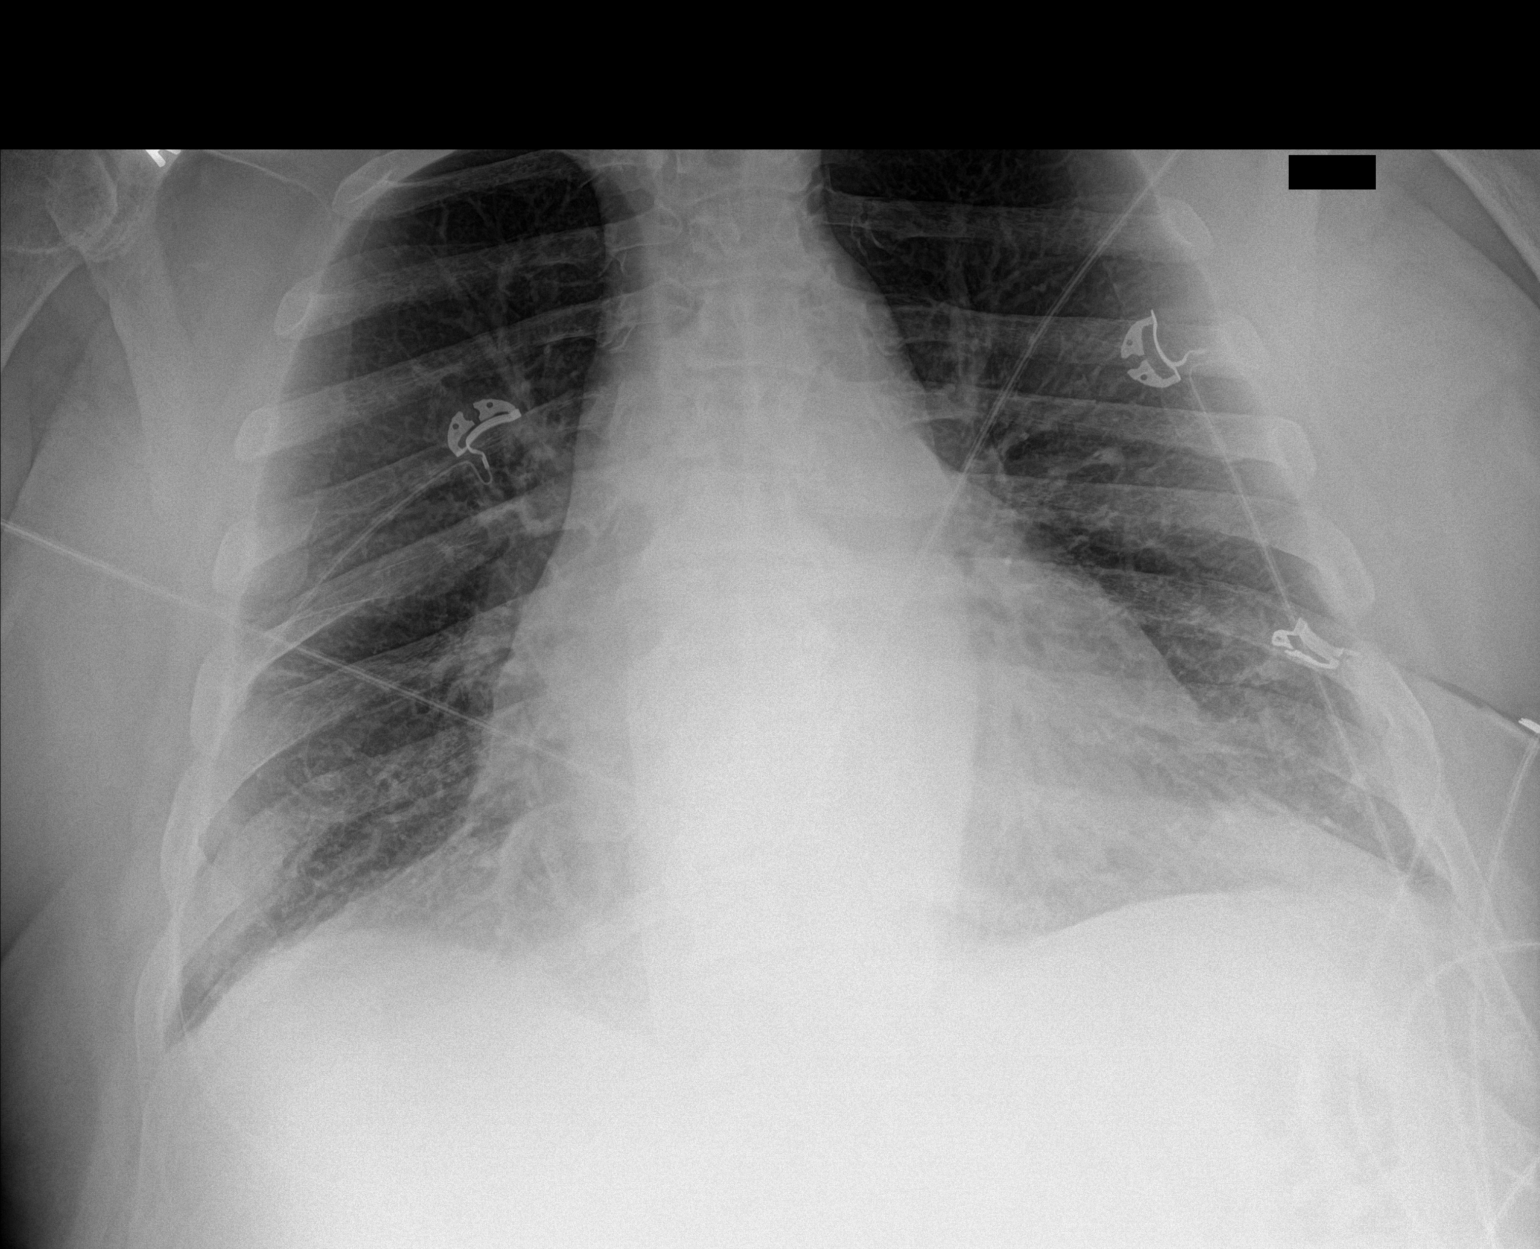

[1 of 1 positions shown; findings below may reference images not displayed]

FINDINGS: Unchanged cardiomegaly mediastinal contours. Stable pulmonary
vasculature with borderline vascular congestion. Left basilar
scarring. No consolidation, pleural effusion, or pneumothorax. No
acute osseous abnormalities are seen.
IMPRESSION: Stable cardiomegaly and left lung base scarring. No acute
abnormality.

## 2019-08-17 ENCOUNTER — Telehealth: Payer: Self-pay

## 2019-08-17 NOTE — Telephone Encounter (Signed)
CONFIRMED 08-21-19 OV WITH PATIENT. REQUESTING A TELEVISIT ON THAT DATE.

## 2019-08-19 ENCOUNTER — Telehealth: Payer: Self-pay

## 2019-08-19 NOTE — Telephone Encounter (Signed)
Confirmed appointment with patient. klh °

## 2019-08-21 ENCOUNTER — Ambulatory Visit (INDEPENDENT_AMBULATORY_CARE_PROVIDER_SITE_OTHER): Payer: Medicare HMO | Admitting: Nurse Practitioner

## 2019-08-21 ENCOUNTER — Encounter: Payer: Self-pay | Admitting: Nurse Practitioner

## 2019-08-21 ENCOUNTER — Other Ambulatory Visit: Payer: Self-pay

## 2019-08-21 VITALS — BP 126/79 | HR 101 | Resp 16 | Ht 70.0 in | Wt 270.0 lb

## 2019-08-21 DIAGNOSIS — Z0001 Encounter for general adult medical examination with abnormal findings: Secondary | ICD-10-CM | POA: Diagnosis not present

## 2019-08-21 DIAGNOSIS — E1165 Type 2 diabetes mellitus with hyperglycemia: Secondary | ICD-10-CM | POA: Diagnosis not present

## 2019-08-21 DIAGNOSIS — Z23 Encounter for immunization: Secondary | ICD-10-CM | POA: Diagnosis not present

## 2019-08-21 DIAGNOSIS — I4891 Unspecified atrial fibrillation: Secondary | ICD-10-CM | POA: Diagnosis not present

## 2019-08-21 DIAGNOSIS — I10 Essential (primary) hypertension: Secondary | ICD-10-CM

## 2019-08-21 MED ORDER — PNEUMOCOCCAL 13-VAL CONJ VACC IM SUSP: 0.5000 mL | Freq: Once | INTRAMUSCULAR | 0 refills | Status: AC

## 2019-08-21 NOTE — Progress Notes (Signed)
[facility] [place],  [id number]  Internal MEDICINE  Telephone Visit  Patient Name: Lawrence Steele  [id number]  [id number]  Date of Service: 08/21/2019  I connected with the patient at 9:15am by telephone and verified the patients identity using two identifiers.   I discussed the limitations, risks, security and privacy concerns of performing an evaluation and management service by telephone and the availability of in person appointments. I also discussed with the patient that there may be a patient responsible charge related to the service.  The patient expressed understanding and agrees to proceed.    Chief Complaint  Patient presents with  . Telephone Assessment  . Telephone Screen  . Annual Exam  . Diabetes  . Hypertension  . Hyperlipidemia  . Quality Metric Gaps    PNEUMONOVAX    The patient has been contacted via telephone for follow up visit due to concerns for spread of novel coronavirus. Today, he presents for wellness visit. He states that he is checking his blood sugars every morning. They are generally in the low 100s. He states that he does get shortness of breath with exertion, but this is not worse than usual. He had partial amputation of left index finger. This was removed due squamous cell carcinoma. He had stitches removed last week. He states that it is a little sore, but this is likely for some time. He is due to have Prevnar 13 vaccine. This will be sent to his pharmacy today.       Current Medication: Outpatient Encounter Medications as of 08/21/2019  Medication Sig  . Accu-Chek Softclix Lancets lancets Use as instructed  Once a daily E11.65  . acetaminophen (TYLENOL) 650 MG CR tablet Take 1,300 mg by mouth every 8 (eight) hours as needed for pain.  Marland Kitchen albuterol (PROAIR HFA) 108 (90 Base) MCG/ACT inhaler Inhale 2 puffs into the lungs every 6 (six) hours as needed. (Patient taking differently: Inhale 2 puffs into the lungs every 6  (six) hours as needed for wheezing or shortness of breath. )  . allopurinol (ZYLOPRIM) 100 MG tablet Take 1 tablet (100 mg total) by mouth 2 (two) times daily.  Marland Kitchen apixaban (ELIQUIS) 5 MG TABS tablet Take 1 tablet (5 mg total) by mouth 2 (two) times daily.  Marland Kitchen atenolol (TENORMIN) 25 MG tablet Take 1 tablet by mouth twice daily  . atorvastatin (LIPITOR) 20 MG tablet Take 1 tablet by mouth once daily  . Blood Glucose Monitoring Suppl (ACCU-CHEK AVIVA PLUS) w/Device KIT Use as directed  . calcium carbonate (TUMS - DOSED IN MG ELEMENTAL CALCIUM) 500 MG chewable tablet Chew 1-2 tablets by mouth 3 (three) times daily as needed for indigestion or heartburn.  . cyclobenzaprine (FLEXERIL) 10 MG tablet Take 1 tablet (10 mg total) by mouth 2 (two) times daily at 10 AM and 5 PM.  . diclofenac sodium (VOLTAREN) 1 % GEL Apply 1 application topically 4 (four) times daily as needed for pain.  Marland Kitchen dicyclomine (BENTYL) 10 MG capsule Take 1 capsule (10 mg total) by mouth 4 (four) times daily -  before meals and at bedtime.  Marland Kitchen doxycycline (VIBRA-TABS) 100 MG tablet Take 1 tablet (100 mg total) by mouth daily.  Marland Kitchen escitalopram (LEXAPRO) 5 MG tablet Take 1 tablet (5 mg total) by mouth daily.  . Fluticasone-Salmeterol (ADVAIR) 250-50 MCG/DOSE AEPB Inhale 1 puff into the lungs 2 (two) times daily.  . furosemide (LASIX) 20 MG tablet Take 2 tablets (40 mg) by mouth  once every other day alternating with 1 tablet (20 mg) by mouth once every other day. (Patient taking differently: Take 20-40 mg by mouth See admin instructions. Take 1 tablet (20 mg) by mouth scheduled daily, if patient experiencing swelling in legs will increase to 2 tablets (40 mg) by mouth in the morning.)  . gabapentin (NEURONTIN) 400 MG capsule Take 400 mg by mouth 4 (four) times daily.   Marland Kitchen glimepiride (AMARYL) 1 MG tablet Take 2 tablets (2 mg total) by mouth daily with supper.  Marland Kitchen glucose blood (ACCU-CHEK AVIVA PLUS) test strip 1 each by Other route as needed for  other. Use as instructed  . glucose blood (ACCU-CHEK AVIVA PLUS) test strip Pt needs accu check Aviva plus lancets, test strips and meter to check blood sugars once daily  . hydroxypropyl methylcellulose / hypromellose (ISOPTO TEARS / GONIOVISC) 2.5 % ophthalmic solution Place 1 drop into both eyes 4 (four) times daily as needed (eye irritation (sand like feeling)).   Marland Kitchen ipratropium (ATROVENT) 0.02 % nebulizer solution Use every 6 hours for Shortness of breath and wheezing and as needed.  Prescription is for 90 days  . isosorbide mononitrate (IMDUR) 60 MG 24 hr tablet Take 1 tablet (60 mg) by mouth once daily at bedtime  . loratadine (CLARITIN) 10 MG tablet Take 10 mg by mouth daily.  . meloxicam (MOBIC) 7.5 MG tablet Take 1 tablet (7.5 mg total) by mouth 2 (two) times a day.  . metFORMIN (GLUCOPHAGE) 500 MG tablet Take 1 tablet (500 mg total) by mouth 2 (two) times daily with a meal.  . nitroGLYCERIN (NITROSTAT) 0.4 MG SL tablet Place 0.4 mg under the tongue every 5 (five) minutes x 3 doses as needed for chest pain.   Marland Kitchen nystatin (MYCOSTATIN) 100000 UNIT/ML suspension Take 5 mLs (500,000 Units total) by mouth 4 (four) times daily as needed (mouth/throat irritation.).  Marland Kitchen oxyCODONE-acetaminophen (PERCOCET) 10-325 MG tablet Take 1 tablet by mouth 3 (three) times daily.   . OXYGEN Inhale 2 L into the lungs continuous.   . traZODone (DESYREL) 100 MG tablet Take 1 tablet (100 mg total) by mouth at bedtime.  Marland Kitchen umeclidinium bromide (INCRUSE ELLIPTA) 62.5 MCG/INH AEPB Inhale 1 puff into the lungs daily.  . pneumococcal 13-valent conjugate vaccine (PREVNAR 13) SUSP injection Inject 0.5 mLs into the muscle once for 1 dose.   No facility-administered encounter medications on file as of 08/21/2019.     Surgical History: Past Surgical History:  Procedure Laterality Date  . ABDOMINAL AORTIC ANEURYSM REPAIR  03/06/2015   [place]    . APPENDECTOMY    . CARDIAC CATHETERIZATION   2010,03/16/2003,11/04/2002  . CARDIOVERSION N/A 04/24/2018   Procedure: CARDIOVERSION;  Surgeon: Nelva Bush, MD;  Location: [facility];  Service: Cardiovascular;  Laterality: N/A;  . CARDIOVERSION N/A 09/05/2018   Procedure: CARDIOVERSION;  Surgeon: Minna Merritts, MD;  Location: [facility];  Service: Cardiovascular;  Laterality: N/A;  . CARDIOVERSION N/A 11/24/2018   Procedure: CARDIOVERSION (CATH LAB);  Surgeon: Wellington Hampshire, MD;  Location: [facility];  Service: Cardiovascular;  Laterality: N/A;  . CARDIOVERSION N/A 03/18/2019   Procedure: CARDIOVERSION;  Surgeon: Minna Merritts, MD;  Location: [facility];  Service: Cardiovascular;  Laterality: N/A;  . CORONARY ANGIOPLASTY  03/16/2003   stent to the RCA  & 2/18/2004stent mid circumflex  . ELBOW SURGERY    . HERNIA REPAIR    . NASAL SINUS SURGERY    . PENILE PROSTHESIS IMPLANT  1995  . PILONIDAL CYST EXCISION    . TEE WITHOUT CARDIOVERSION N/A 04/24/2018   Procedure: TRANSESOPHAGEAL ECHOCARDIOGRAM (TEE);  Surgeon: Nelva Bush, MD;  Location: [facility];  Service: Cardiovascular;  Laterality: N/A;  . TONSILECTOMY, ADENOIDECTOMY, BILATERAL MYRINGOTOMY AND TUBES    . UMBILICAL HERNIA REPAIR      Medical History: Past Medical History:  Diagnosis Date  . AAA (abdominal aortic aneurysm) ([place])    a.  Status post endovascular repair at [facility] in 2016  . Anxiety   . Asthma   . Atrial flutter ([place])    a. diagnosed 7/19; b. CHADS2VASc => 5 (CHF, HTN, age x 1, DM, vascular disease); c. on Eliquis; d. s/p TEE/DCCV 8/19 with repeat DCCV 12/19  . Chronic diastolic CHF (congestive heart failure) ([place])   . COPD (chronic obstructive pulmonary disease) ([place])   . Coronary artery disease    a. LHC 5/10: left main 10%, mLAD-1 lesion 20%, mLAD-2 lesion 20%, D1 20%, mLCx-1 lesion 30%, mLCx-2 lesion 50%, OM1 20%, OM to 20%, LPL 20%, pRCA-1 lesion 20%, pRCA-2 lesion 20%.  EF 51% w/ mild inf HK  . Diabetes mellitus with complication ([place])   . Hyperlipidemia    . Hypertension   . Iliac artery aneurysm, bilateral ([place])   . Obstructive sleep apnea-hypopnea syndrome     Family History: Family History  Problem Relation Age of Onset  . Hypertension Mother   . Hyperlipidemia Mother   . Depression Mother   . Heart attack Father 51  . Heart disease Father   . Hypertension Father   . Hyperlipidemia Father     Social History   Socioeconomic History  . Marital status: Divorced    Spouse name: Not on file  . Number of children: 2  . Years of education: 10   . Highest education level: 10th grade  Occupational History  . Occupation: disabled   Social Needs  . Financial resource strain: Somewhat hard  . Food insecurity    Worry: Never true    Inability: Never true  . Transportation needs    Medical: No    Non-medical: No  Tobacco Use  . Smoking status: Current Every Day Smoker    Packs/day: 0.25    Years: 45.00    Pack years: 11.25    Types: Cigarettes  . Smokeless tobacco: Never Used  . Tobacco comment: pt hasnt smoked lately due to health but states he still smokes  Substance and Sexual Activity  . Alcohol use: Not Currently  . Drug use: No  . Sexual activity: Not on file  Lifestyle  . Physical activity    Days per week: 7 days    Minutes per session: 10 min  . Stress: Only a little  Relationships  . Social connections    Talks on phone: More than three times a week    Gets together: More than three times a week    Attends religious service: Never    Active member of club or organization: No    Attends meetings of clubs or organizations: Not on file    Relationship status: Divorced  . Intimate partner violence    Fear of current or ex partner: Not on file    Emotionally abused: Not on file    Physically abused: Not on file    Forced sexual activity: Not on file  Other Topics Concern  . Not on file  Social History Narrative  . Not on file      Review  of Systems  Constitutional: Positive for fatigue. Negative for  activity change and unexpected weight change.  HENT: Negative for postnasal drip, rhinorrhea, sore throat and voice change.   Respiratory: Positive for cough, shortness of breath and wheezing.        Intermittent wheezing.  Cardiovascular: Positive for palpitations. Negative for chest pain.          Gastrointestinal: Negative for constipation, diarrhea, nausea and vomiting.  Endocrine: Negative for cold intolerance, heat intolerance, polydipsia and polyuria.       States that he has been checking his blood sugars in the mornings. They are mostly running in the low 100s.   Musculoskeletal: Positive for arthralgias, back pain and myalgias.  Skin: Negative for rash.  Allergic/Immunologic: Positive for environmental allergies.  Neurological: Positive for headaches. Negative for seizures.  Hematological: Negative for adenopathy.  Psychiatric/Behavioral: Positive for dysphoric mood. The patient is nervous/anxious.     Today's Vitals   08/21/19 0904  BP: 126/79  Pulse: (!) 101  Resp: 16  SpO2: 92%  Weight: 270 lb (122.5 kg)  Height: 5' 10"  (1.778 m)   Body mass index is 38.74 kg/m.  Observation/Objective:  The patient is alert and oriented. He is pleasant and answering all questions appropriately. Breathing is non-labored. He is in no acute distress.  He does have intermittent, congested, but non-productive cough.    Assessment/Plan: 1. Encounter for general adult medical examination with abnormal findings Annual wellness visit today.  2. Type 2 diabetes mellitus with hyperglycemia, without long-term current use of insulin (HCC) Blood sugars are doing well. Continue diabetic medication as prescribed. Check HgbA1c and perform foot exam at patient's next in-office visit.   3. Atrial fibrillation, unspecified type ([place]) Currently stable. Regular visits with cardiology should be continued.   4. Essential hypertension Stable. Continue bp medication as prescribed  5. Need for  vaccination against Streptococcus pneumoniae Prescription for Prevnar 13 sent to his pharmacy for administration - pneumococcal 13-valent conjugate vaccine (PREVNAR 13) SUSP injection; Inject 0.5 mLs into the muscle once for 1 dose.  Dispense: 0.5 mL; Refill: 0  General Counseling: Melody verbalizes understanding of the findings of today's phone visit and agrees with plan of treatment. I have discussed any further diagnostic evaluation that may be needed or ordered today. We also reviewed his medications today. he has been encouraged to call the office with any questions or concerns that should arise related to todays visit.  Diabetes Counseling:  1. Addition of ACE inh/ ARB'S for nephroprotection. Microalbumin is updated  2. Diabetic foot care, prevention of complications. Podiatry consult 3. Exercise and lose weight.  4. Diabetic eye examination, Diabetic eye exam is updated  5. Monitor blood sugar closlely. nutrition counseling.  6. Sign and symptoms of hypoglycemia including shaking sweating,confusion and headaches.  This patient was seen by [place] with Dr Lavera Guise as a part of collaborative care agreement   Meds ordered this encounter  Medications  . pneumococcal 13-valent conjugate vaccine (PREVNAR 13) SUSP injection    Sig: Inject 0.5 mLs into the muscle once for 1 dose.    Dispense:  0.5 mL    Refill:  0    Order Specific Question:   Supervising Provider    Answer:   Lavera Guise [[id number]]    Time spent: 68 Minutes    Dr Lavera Guise Internal medicine

## 2019-09-01 ENCOUNTER — Telehealth: Payer: Self-pay

## 2019-09-01 ENCOUNTER — Other Ambulatory Visit: Payer: Self-pay

## 2019-09-01 NOTE — Telephone Encounter (Signed)
Confirmed appointment with patient. klh °

## 2019-09-01 NOTE — Patient Outreach (Addendum)
[facility]) Care Management  09/01/2019  Lawrence Steele Jan 30, 1951 [id number]   Telephone call to patient for disease management follow up. No answer.  HIPAA compliant voice message left.    Plan: RN CM will attempt patient again in the month of January and send letter.    Jone Baseman, RN, MSN [place] [facility] Management Care Management Coordinator Direct Line [phone] Cell [phone] Toll Free: [phone]  Fax: [phone]

## 2019-09-02 ENCOUNTER — Ambulatory Visit: Payer: Self-pay

## 2019-09-02 ENCOUNTER — Ambulatory Visit: Payer: Self-pay | Admitting: *Deleted

## 2019-09-02 ENCOUNTER — Ambulatory Visit: Payer: Self-pay | Admitting: Urology

## 2019-09-02 ENCOUNTER — Encounter: Payer: Self-pay | Admitting: Urology

## 2019-09-03 ENCOUNTER — Ambulatory Visit: Payer: Medicare HMO | Admitting: Internal Medicine

## 2019-09-03 ENCOUNTER — Encounter: Payer: Self-pay | Admitting: Internal Medicine

## 2019-09-03 ENCOUNTER — Other Ambulatory Visit: Payer: Self-pay

## 2019-09-03 VITALS — BP 119/70 | HR 110 | Temp 98.7°F | Wt 270.5 lb

## 2019-09-03 DIAGNOSIS — J988 Other specified respiratory disorders: Secondary | ICD-10-CM | POA: Diagnosis not present

## 2019-09-03 DIAGNOSIS — F17200 Nicotine dependence, unspecified, uncomplicated: Secondary | ICD-10-CM

## 2019-09-03 DIAGNOSIS — I10 Essential (primary) hypertension: Secondary | ICD-10-CM

## 2019-09-03 DIAGNOSIS — G4733 Obstructive sleep apnea (adult) (pediatric): Secondary | ICD-10-CM | POA: Diagnosis not present

## 2019-09-03 DIAGNOSIS — Z9989 Dependence on other enabling machines and devices: Secondary | ICD-10-CM | POA: Diagnosis not present

## 2019-09-03 DIAGNOSIS — R05 Cough: Secondary | ICD-10-CM | POA: Diagnosis not present

## 2019-09-03 DIAGNOSIS — R059 Cough, unspecified: Secondary | ICD-10-CM

## 2019-09-03 MED ORDER — PREDNISONE 10 MG PO TABS: ORAL_TABLET | ORAL | 0 refills | Status: DC

## 2019-09-03 MED ORDER — AZITHROMYCIN 250 MG PO TABS: ORAL_TABLET | ORAL | 0 refills | Status: DC

## 2019-09-03 NOTE — Progress Notes (Signed)
[facility] [place], [place] [address]  Internal MEDICINE  Telephone Visit  Patient Name: Lawrence Steele  [id number]  [id number]  Date of Service: 09/03/2019  I connected with the patient at 1043 by telephone and verified the patients identity using two identifiers.   I discussed the limitations, risks, security and privacy concerns of performing an evaluation and management service by telephone and the availability of in person appointments. I also discussed with the patient that there may be a patient responsible charge related to the service.  The patient expressed understanding and agrees to proceed.    Chief Complaint  Patient presents with  . Telephone Screen  . Telephone Assessment  . Cough  . Sinusitis  . COPD    HPI  Pt is seen via telephone.  He is following up on COPD and OSA.  He reports about 3 weeks of cough that was initially productive, but is now dry hacking cough.  He feels some intense stabbing pain in his right back/flank with coughing.  He denies fever but has been having chills.  He has not been tested for Covid recently.  He does go out to the store, and has a daughter who comes over frequently.  He denies any sick contacts.   Pt reports good compliance with CPAP therapy. Cleaning machine by hand, and changing filters and tubing as directed. Denies headaches, sinus issues, palpitations, or hemoptysis.      Current Medication: Outpatient Encounter Medications as of 09/03/2019  Medication Sig  . Accu-Chek Softclix Lancets lancets Use as instructed  Once a daily E11.65  . acetaminophen (TYLENOL) 650 MG CR tablet Take 1,300 mg by mouth every 8 (eight) hours as needed for pain.  Marland Kitchen albuterol (PROAIR HFA) 108 (90 Base) MCG/ACT inhaler Inhale 2 puffs into the lungs every 6 (six) hours as needed. (Patient taking differently: Inhale 2 puffs into the lungs every 6 (six) hours as needed for wheezing or shortness of breath. )  . allopurinol  (ZYLOPRIM) 100 MG tablet Take 1 tablet (100 mg total) by mouth 2 (two) times daily.  Marland Kitchen apixaban (ELIQUIS) 5 MG TABS tablet Take 1 tablet (5 mg total) by mouth 2 (two) times daily.  Marland Kitchen atenolol (TENORMIN) 25 MG tablet Take 1 tablet by mouth twice daily  . atorvastatin (LIPITOR) 20 MG tablet Take 1 tablet by mouth once daily  . Blood Glucose Monitoring Suppl (ACCU-CHEK AVIVA PLUS) w/Device KIT Use as directed  . calcium carbonate (TUMS - DOSED IN MG ELEMENTAL CALCIUM) 500 MG chewable tablet Chew 1-2 tablets by mouth 3 (three) times daily as needed for indigestion or heartburn.  . cyclobenzaprine (FLEXERIL) 10 MG tablet Take 1 tablet (10 mg total) by mouth 2 (two) times daily at 10 AM and 5 PM.  . diclofenac sodium (VOLTAREN) 1 % GEL Apply 1 application topically 4 (four) times daily as needed for pain.  Marland Kitchen dicyclomine (BENTYL) 10 MG capsule Take 1 capsule (10 mg total) by mouth 4 (four) times daily -  before meals and at bedtime.  Marland Kitchen doxycycline (VIBRA-TABS) 100 MG tablet Take 1 tablet (100 mg total) by mouth daily.  Marland Kitchen escitalopram (LEXAPRO) 5 MG tablet Take 1 tablet (5 mg total) by mouth daily.  . Fluticasone-Salmeterol (ADVAIR) 250-50 MCG/DOSE AEPB Inhale 1 puff into the lungs 2 (two) times daily.  . furosemide (LASIX) 20 MG tablet Take 2 tablets (40 mg) by mouth once every other day alternating with 1 tablet (20 mg) by mouth once  every other day. (Patient taking differently: Take 20-40 mg by mouth See admin instructions. Take 1 tablet (20 mg) by mouth scheduled daily, if patient experiencing swelling in legs will increase to 2 tablets (40 mg) by mouth in the morning.)  . gabapentin (NEURONTIN) 400 MG capsule Take 400 mg by mouth 4 (four) times daily.   Marland Kitchen glimepiride (AMARYL) 1 MG tablet Take 2 tablets (2 mg total) by mouth daily with supper.  Marland Kitchen glucose blood (ACCU-CHEK AVIVA PLUS) test strip 1 each by Other route as needed for other. Use as instructed  . glucose blood (ACCU-CHEK AVIVA PLUS) test strip  Pt needs accu check Aviva plus lancets, test strips and meter to check blood sugars once daily  . hydroxypropyl methylcellulose / hypromellose (ISOPTO TEARS / GONIOVISC) 2.5 % ophthalmic solution Place 1 drop into both eyes 4 (four) times daily as needed (eye irritation (sand like feeling)).   Marland Kitchen ipratropium (ATROVENT) 0.02 % nebulizer solution Use every 6 hours for Shortness of breath and wheezing and as needed.  Prescription is for 90 days  . isosorbide mononitrate (IMDUR) 60 MG 24 hr tablet Take 1 tablet (60 mg) by mouth once daily at bedtime  . loratadine (CLARITIN) 10 MG tablet Take 10 mg by mouth daily.  . meloxicam (MOBIC) 7.5 MG tablet Take 1 tablet (7.5 mg total) by mouth 2 (two) times a day.  . metFORMIN (GLUCOPHAGE) 500 MG tablet Take 1 tablet (500 mg total) by mouth 2 (two) times daily with a meal.  . nitroGLYCERIN (NITROSTAT) 0.4 MG SL tablet Place 0.4 mg under the tongue every 5 (five) minutes x 3 doses as needed for chest pain.   Marland Kitchen nystatin (MYCOSTATIN) 100000 UNIT/ML suspension Take 5 mLs (500,000 Units total) by mouth 4 (four) times daily as needed (mouth/throat irritation.).  Marland Kitchen oxyCODONE-acetaminophen (PERCOCET) 10-325 MG tablet Take 1 tablet by mouth 3 (three) times daily.   . OXYGEN Inhale 2 L into the lungs continuous.   . traZODone (DESYREL) 100 MG tablet Take 1 tablet (100 mg total) by mouth at bedtime.  Marland Kitchen umeclidinium bromide (INCRUSE ELLIPTA) 62.5 MCG/INH AEPB Inhale 1 puff into the lungs daily.  Marland Kitchen azithromycin (ZITHROMAX) 250 MG tablet Take as directed  . predniSONE (DELTASONE) 10 MG tablet Use per dose pack   No facility-administered encounter medications on file as of 09/03/2019.    Surgical History: Past Surgical History:  Procedure Laterality Date  . ABDOMINAL AORTIC ANEURYSM REPAIR  03/06/2015   [place]    . APPENDECTOMY    . CARDIAC CATHETERIZATION  2010,03/16/2003,11/04/2002  . CARDIOVERSION N/A 04/24/2018   Procedure: CARDIOVERSION;   Surgeon: Nelva Bush, MD;  Location: [facility];  Service: Cardiovascular;  Laterality: N/A;  . CARDIOVERSION N/A 09/05/2018   Procedure: CARDIOVERSION;  Surgeon: Minna Merritts, MD;  Location: [facility];  Service: Cardiovascular;  Laterality: N/A;  . CARDIOVERSION N/A 11/24/2018   Procedure: CARDIOVERSION (CATH LAB);  Surgeon: Wellington Hampshire, MD;  Location: [facility];  Service: Cardiovascular;  Laterality: N/A;  . CARDIOVERSION N/A 03/18/2019   Procedure: CARDIOVERSION;  Surgeon: Minna Merritts, MD;  Location: [facility];  Service: Cardiovascular;  Laterality: N/A;  . CORONARY ANGIOPLASTY  03/16/2003   stent to the RCA  & 2/18/2004stent mid circumflex  . ELBOW SURGERY    . HERNIA REPAIR    . NASAL SINUS SURGERY    . PENILE PROSTHESIS IMPLANT  1995  . PILONIDAL CYST EXCISION    . TEE WITHOUT CARDIOVERSION  N/A 04/24/2018   Procedure: TRANSESOPHAGEAL ECHOCARDIOGRAM (TEE);  Surgeon: Nelva Bush, MD;  Location: [facility];  Service: Cardiovascular;  Laterality: N/A;  . TONSILECTOMY, ADENOIDECTOMY, BILATERAL MYRINGOTOMY AND TUBES    . UMBILICAL HERNIA REPAIR      Medical History: Past Medical History:  Diagnosis Date  . AAA (abdominal aortic aneurysm) ([place])    a.  Status post endovascular repair at [facility] in 2016  . Anxiety   . Asthma   . Atrial flutter ([place])    a. diagnosed 7/19; b. CHADS2VASc => 5 (CHF, HTN, age x 1, DM, vascular disease); c. on Eliquis; d. s/p TEE/DCCV 8/19 with repeat DCCV 12/19  . Chronic diastolic CHF (congestive heart failure) ([place])   . COPD (chronic obstructive pulmonary disease) ([place])   . Coronary artery disease    a. LHC 5/10: left main 10%, mLAD-1 lesion 20%, mLAD-2 lesion 20%, D1 20%, mLCx-1 lesion 30%, mLCx-2 lesion 50%, OM1 20%, OM to 20%, LPL 20%, pRCA-1 lesion 20%, pRCA-2 lesion 20%.  EF 51% w/ mild inf HK  . Diabetes mellitus with complication ([place])   . Hyperlipidemia   . Hypertension   . Iliac artery aneurysm, bilateral ([place])   . Obstructive sleep  apnea-hypopnea syndrome     Family History: Family History  Problem Relation Age of Onset  . Hypertension Mother   . Hyperlipidemia Mother   . Depression Mother   . Heart attack Father 67  . Heart disease Father   . Hypertension Father   . Hyperlipidemia Father     Social History   Socioeconomic History  . Marital status: Divorced    Spouse name: Not on file  . Number of children: 2  . Years of education: 10   . Highest education level: 10th grade  Occupational History  . Occupation: disabled   Tobacco Use  . Smoking status: Current Every Day Smoker    Packs/day: 0.25    Years: 45.00    Pack years: 11.25    Types: Cigarettes  . Smokeless tobacco: Never Used  . Tobacco comment: pt hasnt smoked lately due to health but states he still smokes  Substance and Sexual Activity  . Alcohol use: Not Currently  . Drug use: No  . Sexual activity: Not on file  Other Topics Concern  . Not on file  Social History Narrative  . Not on file   Social Determinants of Health   Financial Resource Strain:   . Difficulty of Paying Living Expenses: Not on file  Food Insecurity:   . Worried About Charity fundraiser in the Last Year: Not on file  . Ran Out of Food in the Last Year: Not on file  Transportation Needs:   . Lack of Transportation (Medical): Not on file  . Lack of Transportation (Non-Medical): Not on file  Physical Activity:   . Days of Exercise per Week: Not on file  . Minutes of Exercise per Session: Not on file  Stress:   . Feeling of Stress : Not on file  Social Connections:   . Frequency of Communication with Friends and Family: Not on file  . Frequency of Social Gatherings with Friends and Family: Not on file  . Attends Religious Services: Not on file  . Active Member of Clubs or Organizations: Not on file  . Attends Archivist Meetings: Not on file  . Marital Status: Not on file  Intimate Partner Violence:   . Fear of Current or Ex-Partner: Not on  file  .  Emotionally Abused: Not on file  . Physically Abused: Not on file  . Sexually Abused: Not on file      Review of Systems  Constitutional: Negative.  Negative for chills, fatigue and unexpected weight change.  HENT: Negative.  Negative for congestion, rhinorrhea, sneezing and sore throat.   Eyes: Negative for redness.  Respiratory: Negative.  Negative for cough, chest tightness and shortness of breath.   Cardiovascular: Negative.  Negative for chest pain and palpitations.  Gastrointestinal: Negative.  Negative for abdominal pain, constipation, diarrhea, nausea and vomiting.  Endocrine: Negative.   Genitourinary: Negative.  Negative for dysuria and frequency.  Musculoskeletal: Negative.  Negative for arthralgias, back pain, joint swelling and neck pain.  Skin: Negative.  Negative for rash.  Allergic/Immunologic: Negative.   Neurological: Negative.  Negative for tremors and numbness.  Hematological: Negative for adenopathy. Does not bruise/bleed easily.  Psychiatric/Behavioral: Negative.  Negative for behavioral problems, sleep disturbance and suicidal ideas. The patient is not nervous/anxious.     Vital Signs: BP 119/70   Pulse (!) 110   Temp 98.7 F (37.1 C)   Wt 270 lb 8 oz (122.7 kg)   SpO2 91% Comment: 2litre  BMI 38.81 kg/m    Observation/Objective: Pt sounds sick   Assessment/Plan: 1. Respiratory infection Advised patient to take entire course of antibiotics as prescribed with food. Pt should return to clinic in 7-10 days if symptoms fail to improve or new symptoms develop. Take Prednisone as directed - azithromycin (ZITHROMAX) 250 MG tablet; Take as directed  Dispense: 6 tablet; Refill: 0 - predniSONE (DELTASONE) 10 MG tablet; Use per dose pack  Dispense: 21 tablet; Refill: 0  2. OSA on CPAP Continue to use cpap as directed.   3. Essential hypertension Controlled, continue present management.   4. Nicotine dependence with current use Smoking cessation  counseling: 1. Pt acknowledges the risks of long term smoking, she will try to quite smoking. 2. Options for different medications including nicotine products, chewing gum, patch etc, Wellbutrin and Chantix is discussed 3. Goal and date of compete cessation is discussed 4. Total time spent in smoking cessation is 15 min.  5. Cough Pt to get CXR asap - DG Chest 2 View; Future  General Counseling: Lawrence Steele verbalizes understanding of the findings of today's phone visit and agrees with plan of treatment. I have discussed any further diagnostic evaluation that may be needed or ordered today. We also reviewed his medications today. he has been encouraged to call the office with any questions or concerns that should arise related to todays visit.    Orders Placed This Encounter  Procedures  . DG Chest 2 View    Meds ordered this encounter  Medications  . azithromycin (ZITHROMAX) 250 MG tablet    Sig: Take as directed    Dispense:  6 tablet    Refill:  0  . predniSONE (DELTASONE) 10 MG tablet    Sig: Use per dose pack    Dispense:  21 tablet    Refill:  0    Time spent: 15 Minutes    Orson Gear AGNP-C Pulmonary Medicine

## 2019-09-04 ENCOUNTER — Ambulatory Visit
Admission: RE | Admit: 2019-09-04 | Discharge: 2019-09-04 | Disposition: A | Discharge status: Home / Self Care | Payer: Medicare HMO | Source: Ambulatory Visit | Attending: Internal Medicine | Admitting: Internal Medicine

## 2019-09-04 ENCOUNTER — Ambulatory Visit
Admission: RE | Admit: 2019-09-04 | Discharge: 2019-09-04 | Disposition: A | Discharge status: Home / Self Care | Payer: Medicare HMO | Source: Ambulatory Visit | Attending: Adult Health | Admitting: Adult Health

## 2019-09-04 ENCOUNTER — Telehealth: Payer: Self-pay | Admitting: Cardiovascular Disease

## 2019-09-04 ENCOUNTER — Other Ambulatory Visit: Payer: Self-pay

## 2019-09-04 DIAGNOSIS — R05 Cough: Secondary | ICD-10-CM | POA: Insufficient documentation

## 2019-09-04 DIAGNOSIS — R059 Cough, unspecified: Secondary | ICD-10-CM

## 2019-09-04 DIAGNOSIS — R0602 Shortness of breath: Secondary | ICD-10-CM | POA: Diagnosis not present

## 2019-09-04 MED ORDER — ATENOLOL 25 MG PO TABS: ORAL_TABLET | ORAL | Status: DC

## 2019-09-04 NOTE — Telephone Encounter (Signed)
STAT if HR is under 50 or over 120 (normal HR is 60-100 beats per minute)  1) What is your heart rate? 165, 167 dropped to 70 then back up again   2) Do you have a log of your heart rate readings (document readings)?   3) Do you have any other symptoms? A little SOB   Patient scheduled to see C. Walker on Tues 12/22 - patient has recently started antibiotics which could be causing issues

## 2019-09-04 NOTE — Telephone Encounter (Signed)
I called and spoke with the patient. He states he has been dealing with a respiratory infection. He had a chest x-ray done and was started on Azithromycin and a Prednisone taper that he started yesterday. He feels like his HR's have been settled for a while, but have been slowly creeping back up.  He checked his BP/ HR yesterday and he was 119/70 (110). HR's today have been up in the 160's and back down to the 70's.  I had the patient check his HR's while on the phone with me and his HR's were 79>>133>>169>> 80. He is currently on 2L of O2 with sats at 97 %.  I have advised the patient that his prednisone may have triggered his faster HR's today. He is scheduled to come in to the clinic on 12/22 to see Terie Purser, NP.  I asked the patient if he is get to [place] tested because with his symptoms I can't advise him to come to the clinic in person at this time. The patient states he was told by his PCP to go for COVID testing, but he has not scheduled this yet as he was given a # to text to but his phone does not have texting. He is waiting on his daughter. I gave the patient an actual phone number to call for [facility] [place] scheduling.   The patient denies fever, but has had chills and sweats.  I have advised I will review further with our APP and call him back.  The patient voices understanding and is agreeable.

## 2019-09-04 NOTE — Telephone Encounter (Signed)
I reviewed the information below with Ignacia Bayley, NP. Per Gerald Stabs, he feels the patient's HR's are most likely being aggravated by his current respiratory illness.  Based on variable HR's the patient may currently be going in/ out of a-fib.  Recommendations from Ignacia Bayley, NP are: 1) Increase atenolol 25 mg- take 2 tablets (50 mg) in the morning and 1 tablet (25 mg) in the evening, at least until his Prednisone taper is done.  Gerald Stabs is agreeable that we should switch the patient's in office visit on 12/22 with Caitlyn, NP to a telehealth visit. The patient is agreeable and voices understanding of the above recommendations.   He will be a telephone visit due to no texting capability.   He has a current telehealth consent on file dated 01/05/2019.  The patient is advised to check/ record his HR's/ BP over the weekend. He is aware 1 of our CMA's will call to follow up with him as well just prior to his visit to go over his meds/ vitals.  He is also aware if symptoms worsen over the weekend to proceed to the ER or call the office to be directed to the physician on call.   The patient states he is scheduled for a COVID swab on Monday 12/ at 12:30 pm.

## 2019-09-07 ENCOUNTER — Ambulatory Visit: Payer: Medicare HMO | Attending: Internal Medicine

## 2019-09-07 DIAGNOSIS — Z20822 Contact with and (suspected) exposure to covid-19: Secondary | ICD-10-CM

## 2019-09-07 DIAGNOSIS — Z20828 Contact with and (suspected) exposure to other viral communicable diseases: Secondary | ICD-10-CM | POA: Diagnosis not present

## 2019-09-08 ENCOUNTER — Other Ambulatory Visit: Payer: Self-pay

## 2019-09-08 ENCOUNTER — Telehealth (INDEPENDENT_AMBULATORY_CARE_PROVIDER_SITE_OTHER): Payer: Medicare HMO | Admitting: Family

## 2019-09-08 ENCOUNTER — Encounter: Payer: Self-pay | Admitting: Family

## 2019-09-08 VITALS — BP 103/65 | HR 99 | Ht 70.0 in | Wt 270.0 lb

## 2019-09-08 DIAGNOSIS — I714 Abdominal aortic aneurysm, without rupture, unspecified: Secondary | ICD-10-CM

## 2019-09-08 DIAGNOSIS — I2510 Atherosclerotic heart disease of native coronary artery without angina pectoris: Secondary | ICD-10-CM

## 2019-09-08 DIAGNOSIS — I5032 Chronic diastolic (congestive) heart failure: Secondary | ICD-10-CM | POA: Diagnosis not present

## 2019-09-08 DIAGNOSIS — Z79899 Other long term (current) drug therapy: Secondary | ICD-10-CM

## 2019-09-08 DIAGNOSIS — E785 Hyperlipidemia, unspecified: Secondary | ICD-10-CM

## 2019-09-08 DIAGNOSIS — I483 Typical atrial flutter: Secondary | ICD-10-CM | POA: Diagnosis not present

## 2019-09-08 LAB — NOVEL CORONAVIRUS, NAA: SARS-CoV-2, NAA: NOT DETECTED

## 2019-09-08 NOTE — Progress Notes (Signed)
Virtual Visit via Telephone Note   This visit type was conducted due to national recommendations for restrictions regarding the COVID-19 Pandemic (e.g. social distancing) in an effort to limit this patient's exposure and mitigate transmission in our community.  Due to his co-morbid illnesses, this patient is at least at moderate risk for complications without adequate follow up.  This format is felt to be most appropriate for this patient at this time.  The patient did not have access to video technology/had technical difficulties with video requiring transitioning to audio format only (telephone).  All issues noted in this document were discussed and addressed.  No physical exam could be performed with this format.  Please refer to the patient's chart for his  consent to telehealth for [facility].   Date:  09/08/2019   ID:  Lawrence Steele, DOB 1951/04/02, MRN [id number]  Patient Location: Home Provider Location: Office  PCP:  Ronnell Freshwater, NP  Cardiologist:  Ida Rogue, MD  Electrophysiologist:  None   Evaluation Performed:  Follow-Up Visit  Chief Complaint:  Rapid heart rates  History of Present Illness:    Lawrence Steele is a 68 y.o. male with atrial flutter, chronic anticoagulation on apixaban, oxygen dependent COPD, OSA on CPAP, CAD.  He was last seen by Dr. Caryl Comes 04/07/2019.  PCI to RCA and LCx 2004. He had a left heart cath 08/2014 with EF 51% and nonobstructive coronary artery disease. Lexiscan 10/2016 EF 62% with frequent PAC and low risk study.  Diagnosed with atrial flutter with RVR 03/2018, started on eliquis, underwent successful TEE guided DCCV with EF 55-60%. CT abd 09/2015 at [facility] with stable oartoiliac endograft with decreased excluded aneurysm size. Stable bilateral common iliac artery aneurysms, cysts table anterior abdominal wall hernia containing nonobstructed small bowel, anterior bladder, and penile prosthesis reservoir.    Admitted 09/03/2018 with URI and  atrial flutter with RVR.  Successful cardioversion 09/05/2018.  After that cardioversion he noted weight gain and was started on Lasix.  Echo 10/09/2018 EF 60 to 65%, mildly dilated LV, grade 2 DD, mildly dilated LA, RV systolic function normal, PASP normal.  He again had recurrent atrial flutter and underwent successful cardioversion 11/24/2018.  He was referred to EP for evaluation.  At his last office visit with Dr. Caryl Comes it was noted that Dr. Caryl Comes was to discuss with Dr. Rayann Heman about possibility of an ablation.  He called the office 09/04/2019 with reports of elevated heart rates.  Heart rate as high as 160 which concerned him.  He reports no shortness of breath nor chest pain.  Reports his breathing is at his baseline.  At that time he was recommended to increase his morning atenolol dose. He has had some low blood pressures over the weekend as low as 85/56 associated with dizziness.  Heart rates 100-1 28 over the weekend after increased atenolol dose.  Tells me he has noticed an increase in heart rate over the last 3 months.  The patient does have symptoms concerning for COVID-19 infection (fever, chills, cough, or new shortness of breath).  He has some shortness of breath for which he has been prescribed azithromycin and prednisone by his primary care provider.  He underwent Covid testing yesterday and is awaiting results.  We reviewed quarantine recommendations and he is agreeable.  We reviewed the 3 W's.   Past Medical History:  Diagnosis Date  . AAA (abdominal aortic aneurysm) ([place])    a.  Status post endovascular repair at [facility]  in 2016  . Anxiety   . Asthma   . Atrial flutter ([place])    a. diagnosed 7/19; b. CHADS2VASc => 5 (CHF, HTN, age x 1, DM, vascular disease); c. on Eliquis; d. s/p TEE/DCCV 8/19 with repeat DCCV 12/19  . Chronic diastolic CHF (congestive heart failure) ([place])   . COPD (chronic obstructive pulmonary disease) ([place])   . Coronary artery disease    a. LHC 5/10: left main  10%, mLAD-1 lesion 20%, mLAD-2 lesion 20%, D1 20%, mLCx-1 lesion 30%, mLCx-2 lesion 50%, OM1 20%, OM to 20%, LPL 20%, pRCA-1 lesion 20%, pRCA-2 lesion 20%.  EF 51% w/ mild inf HK  . Diabetes mellitus with complication ([place])   . Hyperlipidemia   . Hypertension   . Iliac artery aneurysm, bilateral ([place])   . Obstructive sleep apnea-hypopnea syndrome    Past Surgical History:  Procedure Laterality Date  . ABDOMINAL AORTIC ANEURYSM REPAIR  03/06/2015   [place]    . APPENDECTOMY    . CARDIAC CATHETERIZATION  2010,03/16/2003,11/04/2002  . CARDIOVERSION N/A 04/24/2018   Procedure: CARDIOVERSION;  Surgeon: Nelva Bush, MD;  Location: [place] [facility];  Service: Cardiovascular;  Laterality: N/A;  . CARDIOVERSION N/A 09/05/2018   Procedure: CARDIOVERSION;  Surgeon: Minna Merritts, MD;  Location: [facility];  Service: Cardiovascular;  Laterality: N/A;  . CARDIOVERSION N/A 11/24/2018   Procedure: CARDIOVERSION (CATH LAB);  Surgeon: Wellington Hampshire, MD;  Location: [place] [facility];  Service: Cardiovascular;  Laterality: N/A;  . CARDIOVERSION N/A 03/18/2019   Procedure: CARDIOVERSION;  Surgeon: Minna Merritts, MD;  Location: [facility];  Service: Cardiovascular;  Laterality: N/A;  . CORONARY ANGIOPLASTY  03/16/2003   stent to the RCA  & 2/18/2004stent mid circumflex  . ELBOW SURGERY    . HERNIA REPAIR    . NASAL SINUS SURGERY    . PENILE PROSTHESIS IMPLANT  1995  . PILONIDAL CYST EXCISION    . TEE WITHOUT CARDIOVERSION N/A 04/24/2018   Procedure: TRANSESOPHAGEAL ECHOCARDIOGRAM (TEE);  Surgeon: Nelva Bush, MD;  Location: [facility];  Service: Cardiovascular;  Laterality: N/A;  . TONSILECTOMY, ADENOIDECTOMY, BILATERAL MYRINGOTOMY AND TUBES    . UMBILICAL HERNIA REPAIR       Current Meds  Medication Sig  . Accu-Chek Softclix Lancets lancets Use as instructed  Once a daily E11.65  . acetaminophen (TYLENOL) 650 MG CR tablet Take 1,300 mg by mouth every 8 (eight) hours as needed for pain.   Marland Kitchen albuterol (PROAIR HFA) 108 (90 Base) MCG/ACT inhaler Inhale 2 puffs into the lungs every 6 (six) hours as needed.  Marland Kitchen allopurinol (ZYLOPRIM) 100 MG tablet Take 1 tablet (100 mg total) by mouth 2 (two) times daily.  Marland Kitchen apixaban (ELIQUIS) 5 MG TABS tablet Take 1 tablet (5 mg total) by mouth 2 (two) times daily.  Marland Kitchen atenolol (TENORMIN) 25 MG tablet Take 2 tablets (50 mg) in the morning and 1 tablet (25 mg) in the evening  . atorvastatin (LIPITOR) 20 MG tablet Take 1 tablet by mouth once daily  . Blood Glucose Monitoring Suppl (ACCU-CHEK AVIVA PLUS) w/Device KIT Use as directed  . calcium carbonate (TUMS - DOSED IN MG ELEMENTAL CALCIUM) 500 MG chewable tablet Chew 1-2 tablets by mouth 3 (three) times daily as needed for indigestion or heartburn.  . cyclobenzaprine (FLEXERIL) 10 MG tablet Take 1 tablet (10 mg total) by mouth 2 (two) times daily at 10 AM and 5 PM.  . diclofenac sodium (VOLTAREN) 1 % GEL Apply 1 application  topically 4 (four) times daily as needed for pain.  Marland Kitchen doxycycline (VIBRA-TABS) 100 MG tablet Take 1 tablet (100 mg total) by mouth daily.  Marland Kitchen escitalopram (LEXAPRO) 5 MG tablet Take 1 tablet (5 mg total) by mouth daily.  . Fluticasone-Salmeterol (ADVAIR) 250-50 MCG/DOSE AEPB Inhale 1 puff into the lungs 2 (two) times daily.  . furosemide (LASIX) 20 MG tablet Take 2 tablets (40 mg) by mouth once every other day alternating with 1 tablet (20 mg) by mouth once every other day. (Patient taking differently: Take 20-40 mg by mouth See admin instructions. Take 1 tablet (20 mg) by mouth scheduled daily, if patient experiencing swelling in legs will increase to 2 tablets (40 mg) by mouth in the morning.)  . gabapentin (NEURONTIN) 400 MG capsule Take 400 mg by mouth 4 (four) times daily.   Marland Kitchen glimepiride (AMARYL) 1 MG tablet Take 2 tablets (2 mg total) by mouth daily with supper.  Marland Kitchen glucose blood (ACCU-CHEK AVIVA PLUS) test strip 1 each by Other route as needed for other. Use as instructed  .  glucose blood (ACCU-CHEK AVIVA PLUS) test strip Pt needs accu check Aviva plus lancets, test strips and meter to check blood sugars once daily  . hydroxypropyl methylcellulose / hypromellose (ISOPTO TEARS / GONIOVISC) 2.5 % ophthalmic solution Place 1 drop into both eyes 4 (four) times daily as needed (eye irritation (sand like feeling)).   Marland Kitchen ipratropium (ATROVENT) 0.02 % nebulizer solution Use every 6 hours for Shortness of breath and wheezing and as needed.  Prescription is for 90 days  . isosorbide mononitrate (IMDUR) 60 MG 24 hr tablet Take 1 tablet (60 mg) by mouth once daily at bedtime  . loratadine (CLARITIN) 10 MG tablet Take 10 mg by mouth daily.  . meloxicam (MOBIC) 7.5 MG tablet Take 1 tablet (7.5 mg total) by mouth 2 (two) times a day.  . metFORMIN (GLUCOPHAGE) 500 MG tablet Take 1 tablet (500 mg total) by mouth 2 (two) times daily with a meal.  . nitroGLYCERIN (NITROSTAT) 0.4 MG SL tablet Place 0.4 mg under the tongue every 5 (five) minutes x 3 doses as needed for chest pain.   Marland Kitchen nystatin (MYCOSTATIN) 100000 UNIT/ML suspension Take 5 mLs (500,000 Units total) by mouth 4 (four) times daily as needed (mouth/throat irritation.).  Marland Kitchen OXYGEN Inhale 2 L into the lungs continuous.   . traZODone (DESYREL) 100 MG tablet Take 1 tablet (100 mg total) by mouth at bedtime.  Marland Kitchen umeclidinium bromide (INCRUSE ELLIPTA) 62.5 MCG/INH AEPB Inhale 1 puff into the lungs daily.  . [DISCONTINUED] oxyCODONE-acetaminophen (PERCOCET) 10-325 MG tablet Take 1 tablet by mouth 3 (three) times daily.      Allergies:   Quinolones   Social History   Tobacco Use  . Smoking status: Current Every Day Smoker    Packs/day: 0.25    Years: 45.00    Pack years: 11.25    Types: Cigarettes  . Smokeless tobacco: Never Used  . Tobacco comment: pt hasnt smoked lately due to health but states he still smokes  Substance Use Topics  . Alcohol use: Not Currently  . Drug use: No     Family Hx: The patient's family history  includes Depression in his mother; Heart attack (age of onset: 41) in his father; Heart disease in his father; Hyperlipidemia in his father and mother; Hypertension in his father and mother.  ROS:   Please see the history of present illness.    Review of Systems  Constitution: Negative  for chills, fever and malaise/fatigue.  Cardiovascular: Positive for palpitations. Negative for chest pain, dyspnea on exertion, leg swelling, near-syncope, orthopnea and syncope.       "rapid heart rates"  Respiratory: Positive for shortness of breath (at baseline). Negative for cough and wheezing.   Gastrointestinal: Negative for nausea and vomiting.  Neurological: Negative for dizziness, light-headedness and weakness.   All other systems reviewed and are negative. Prior CV studies:   The following studies were reviewed today:  Echo 10/09/18 - Left ventricle: The cavity size was mildly dilated. Systolic   function was normal. The estimated ejection fraction was in the   range of 60% to 65%. Wall motion was normal; there were no   regional wall motion abnormalities. Features are consistent with   a pseudonormal left ventricular filling pattern, with concomitant   abnormal relaxation and increased filling pressure (grade 2   diastolic dysfunction). - Left atrium: The atrium was mildly dilated. - Right ventricle: Systolic function was normal. - Pulmonary arteries: Systolic pressure was within the normal   range.   Impressions:   - Challenging image quality. Rhythym appears to be NSR.  Labs/Other Tests and Data Reviewed:    EKG:  An ECG dated 04/07/19 was personally reviewed today and demonstrated:  SR with PAC rate 76 bpm  Recent Labs: 09/19/2018: ALT 19 11/21/2018: TSH 0.924 03/13/2019: BUN 19; Creatinine, Ser 0.94; Hemoglobin 13.8; Platelets 150; Potassium 4.6; Sodium 139   Recent Lipid Panel Lab Results  Component Value Date/Time   CHOL 199 10/25/2016 07:53 AM   TRIG 114 10/25/2016 07:53 AM    HDL 56 10/25/2016 07:53 AM   CHOLHDL 3.6 10/25/2016 07:53 AM   LDLCALC 120 (H) 10/25/2016 07:53 AM    Wt Readings from Last 3 Encounters:  09/08/19 270 lb (122.5 kg)  09/03/19 270 lb 8 oz (122.7 kg)  08/21/19 270 lb (122.5 kg)     Objective:    Vital Signs:  BP 103/65   Pulse 99   Ht _0  (1.778 m)   Wt 270 lb (122.5 kg)   BMI 38.74 kg/m    VITAL SIGNS:  reviewed RESPIRATORY:  normal respiratory effort, symmetric expansion PSYCH:  normal affect  ASSESSMENT & PLAN:    1. Tachycardia/atrial flutter- Elevated heart rates in the setting of acute viral illness and treat with prednisone.  Unfortunately no EKG capabilities at home and he is unable to tell if he is in recurrent atrial flutter.  He will continue the increased atenolol dose as changed 09/04/2019 as he has had improvement in his heart rates with heart rate routinely 90s to 125.  He was educated to report heart rate consistently more than 130.  Hesitant to further uptitrate beta-blocker or add CCB secondary to low normal blood pressure at home with some reports of dizziness.  Pending results of his Covid test which was done yesterday we will get him into the office for an EKG.  If he is in recurrent atrial flutter likely will need to rediscuss atrial flutter ablation with Dr. Tomma Lightning. Rayann Heman as he has had multiple prior cardioversions.  2. Chronic anticoagulation -secondary to paroxysmal atrial flutter.  CHA2DS2-VASc score of 3 (HTN, age, DM2).  He denies bleeding complications.  Continue Eliquis 5 mg twice daily.  3. CAD - Stable with no anginal symptoms. Continue GDMT beta blocker and statin. No aspirin secondary to chronic anticoagulation. No indication for ischemia evaluation at this time.   4. HFpEF - Reports weight stable at baseline. Reports  breathing at his baseline. Concern for recurrent volume overload in setting of tachycardia and concern for recurrent atrial flutter. Continue present Lasix dose.   5. OSA on CPAP  -compliant with CPAP.  Encouraged him to continue.  6. HTN -BP well controlled.  She monitors at home routinely.  Continue present antihypertensive regimen.  7. Tobacco abuse - Continues to smoke. Smoking cessation encouraged. Recommend utilization of [id number].  8. Respiratory infection -telephone visit with pulmonology 09/03/2019.  He was started on azithromycin and prednisone.Prednisone taper today.  He had a chest x-ray that was without acute findings.  He had Covid testing yesterday and we await results.  Prednisone and current illness may be contributing to elevated heart rates.  9. AAA - s/p endovascular repair. Follows with [facility].   COVID-19 Education: The signs and symptoms of COVID-19 were discussed with the patient and how to seek care for testing (follow up with PCP or arrange E-visit).  The importance of social distancing was discussed today.  Time:   Today, I have spent 25 minutes with the patient with telehealth technology discussing the above problems.     Medication Adjustments/Labs and Tests Ordered: Current medicines are reviewed at length with the patient today.  Concerns regarding medicines are outlined above.   Tests Ordered: No orders of the defined types were placed in this encounter.   Medication Changes: No orders of the defined types were placed in this encounter.   Follow Up:  In Person in 1 week(s) pending results of COVID test. I will call Monday to check on COVID test. If test result negative, schedule for office visit. If test positive, discuss plan of care with Dr. Rockey Situ and Caryl Comes.   Signed, Loel Dubonnet, NP  09/08/2019 12:48 PM    [facility]

## 2019-09-09 ENCOUNTER — Telehealth: Payer: Self-pay | Admitting: Adult Health

## 2019-09-09 ENCOUNTER — Telehealth: Payer: Self-pay | Admitting: Cardiovascular Disease

## 2019-09-09 NOTE — Telephone Encounter (Signed)
Follow Up:  In Person in 1 week(s) pending results of COVID test. I will call Monday to check on COVID test. If test result negative, schedule for office visit. If test positive, discuss plan of care with Dr. Rockey Situ and Caryl Comes.

## 2019-09-14 NOTE — Telephone Encounter (Signed)
lmov to schedule fu.  Needed 12/29 .  Covid Negative.

## 2019-09-16 ENCOUNTER — Telehealth: Payer: Self-pay

## 2019-09-16 ENCOUNTER — Other Ambulatory Visit: Payer: Self-pay | Admitting: Nurse Practitioner

## 2019-09-16 DIAGNOSIS — K5792 Diverticulitis of intestine, part unspecified, without perforation or abscess without bleeding: Secondary | ICD-10-CM

## 2019-09-16 MED ORDER — METRONIDAZOLE 500 MG PO TABS: 500.0000 mg | ORAL_TABLET | Freq: Three times a day (TID) | ORAL | 0 refills | Status: DC

## 2019-09-16 NOTE — Telephone Encounter (Signed)
I sent new prescription for metronidazole 500mg  which is three times daily for next 7 days. I recommend he try gasX to help with bloating and gas. He should not drink any alcolhol with this medication. May make him very ill to his stomach.

## 2019-09-16 NOTE — Progress Notes (Signed)
I sent new prescription for metronidazole 500mg  which is three times daily for next 7 days. I recommend he try gasX to help with bloating and gas. He should not drink any alcolhol with this medication. May make him very ill to his stomach.

## 2019-09-17 NOTE — Telephone Encounter (Signed)
Pt advised we send med and see attached message

## 2019-09-19 NOTE — Progress Notes (Signed)
Office Visit    Patient Name: Lawrence Steele Date of Encounter: 09/22/2019  Primary Care Provider:  Ronnell Freshwater, NP Primary Cardiologist:  Ida Rogue, MD  Chief Complaint    69 year old male with history of atrial flutter (03/2018) on apixaban s/p multiple cardioversions (04/2018, 08/2018, 11/2018), CAD s/p PCI to RCA and LCx (2004) with repeat cath (01/2014) , HTN, HLD with statin intolerance, h/o documented alcohol abuse, low back pain, h/o falls. AAA s/p endovascular repair 2016, COPD on oxygen at night, OSA on CPAP, DM2, and seen today for 1 week follow-up of elevated heart rate.  Past Medical History    Past Medical History:  Diagnosis Date   AAA (abdominal aortic aneurysm) ([place])    a.  Status post endovascular repair at [facility] in 2016   Anxiety    Asthma    Atrial flutter ([place])    a. diagnosed 7/19; b. CHADS2VASc => 5 (CHF, HTN, age x 1, DM, vascular disease); c. on Eliquis; d. s/p TEE/DCCV 8/19 with repeat DCCV 12/19   Cancer ([place])    melanoma L index finger    Chronic diastolic CHF (congestive heart failure) (HCC)    COPD (chronic obstructive pulmonary disease) ([place])    Coronary artery disease    a. LHC 5/10: left main 10%, mLAD-1 lesion 20%, mLAD-2 lesion 20%, D1 20%, mLCx-1 lesion 30%, mLCx-2 lesion 50%, OM1 20%, OM to 20%, LPL 20%, pRCA-1 lesion 20%, pRCA-2 lesion 20%.  EF 51% w/ mild inf HK   Diabetes mellitus with complication (HCC)    Hyperlipidemia    Hypertension    Iliac artery aneurysm, bilateral ([place])    Obstructive sleep apnea-hypopnea syndrome    Past Surgical History:  Procedure Laterality Date   ABDOMINAL AORTIC ANEURYSM REPAIR  03/06/2015   [facility]    ADENOIDECTOMY     AMPUTATION Left    partial amputation   APPENDECTOMY     CARDIAC CATHETERIZATION  2010,03/16/2003,11/04/2002   CARDIOVERSION N/A 04/24/2018   Procedure: CARDIOVERSION;  Surgeon: Nelva Bush, MD;  Location: [facility];  Service: Cardiovascular;  Laterality:  N/A;   CARDIOVERSION N/A 09/05/2018   Procedure: CARDIOVERSION;  Surgeon: Minna Merritts, MD;  Location: [facility];  Service: Cardiovascular;  Laterality: N/A;   CARDIOVERSION N/A 11/24/2018   Procedure: CARDIOVERSION (CATH LAB);  Surgeon: Wellington Hampshire, MD;  Location: [facility];  Service: Cardiovascular;  Laterality: N/A;   CARDIOVERSION N/A 03/18/2019   Procedure: CARDIOVERSION;  Surgeon: Minna Merritts, MD;  Location: [facility];  Service: Cardiovascular;  Laterality: N/A;   CORONARY ANGIOPLASTY  03/16/2003   stent to the RCA  & 2/18/2004stent mid circumflex   ELBOW SURGERY     HERNIA REPAIR     NASAL SINUS SURGERY     [place]     TEE WITHOUT CARDIOVERSION N/A 04/24/2018   Procedure: TRANSESOPHAGEAL ECHOCARDIOGRAM (TEE);  Surgeon: Nelva Bush, MD;  Location: [facility];  Service: Cardiovascular;  Laterality: N/A;   TONSILECTOMY, ADENOIDECTOMY, BILATERAL MYRINGOTOMY AND TUBES     UMBILICAL HERNIA REPAIR      Allergies  Allergies  Allergen Reactions   Quinolones     Aneurysmal disease (History of AAA repair)    History of Present Illness    69 year old male with PMH as above.  He underwent PCI to RCA and left circumflex in 2004.  [place] 2015 showed EF 51% and nonobstructive coronary artery disease. He has a h/o AAA  s/p endovascular repair in 2016 with repeat imaging in 2017 showing stable repair with decreased aneurysm size. He was also noted to have stable bilateral common iliac artery aneurysms, stable anterior abdominal wall hernia with nonobstructed small bowel, anterior bladder, and penile prosthesis reservoir. Lexiscan 10/2016 EF 62% with fixed perfusion defect in the inferior apical region on attenuation corrected images, frequent PACs, and ruled low risk study.  He was diagnosed with atrial flutter with RVR 03/2018 and started on Eliquis.  He underwent successful TEE/DCCV 04/2018 with EF 55 to 60% and repeat DCCV  08/2018 and 11/2018. Echo 09/2018 showed EF 60-65%. He was referred to EP with last visit 03/2019 and recommendation to proceed with catheter ablation. Given normal LV function and NSR at visit, his digoxin was discontinued.  On 09/04/2019, he called the office with report of elevated rates into the 160s. No associated sx. It was recommended he increase his atenolol with subsequent rates 100-128 and low BP/dizziness. He was instructed to keep the atenolol at his current dose and call the office for rates consistently over 130.  He was seen for virtual follow-up 09/08/2019 with concerning sx for COVID-19 and subsequent test negative. He had recently been seen by pulmonology and started on abx and steroids. He continues to smoke.  Today, he is back in atrial flutter with patient reporting that he feels very symptomatic when in atrial flutter. He is aware of his rapid HR and has felt dizzy at times, especially with rates into the 160s. No syncope or LOC with known / ongoing gate instability.  He notes his dizziness has progressed to the level that he needs to prop himself up while using the restroom or even with short trips from the couch to kitchen. He does admit to some orthostatic sx, which are chronic for him and controlled by changing positions slowly. His HR has been elevated despite increasing the atenolol dose, and he feels that his symptoms have been progressing over the last few months. He denies any CP but does note when his heart is beating irregularly. He notes SOB/DOE and increasing fatigue. He states that he is now wearing his oxygen around the clock with previously documented home oxygen use as mainly PM. He continues to wear his CPAP at night. He does not feel well rested, even when sleeping for long stretches at a time. He also has noted edema in his bilateral extremities. He has noted weight gain with EMR reviewed and reflective of increased weight. He reports that he may be missing anticoagulation  doses at times, given he sometimes notices he has pills left over in his bottle. No s/sx consistent with bleeding. He denies anginal sx with exertion or at rest. No increased orthopnea, PND, or early satiety. He stresses today that his main complaint is of fatigue and that the atrial tachycardia has severely interfered with his lifestyle. He expresses his eagerness to proceed with ablation, if possible; however, he also notes concern that the procedure is done in [place]. He states today that he does not have transportation and is allotted 6 rides per year via his insurance.   Home Medications    Prior to Admission medications   Medication Sig Start Date End Date Taking? Authorizing Provider  Accu-Chek Softclix Lancets lancets Use as instructed  Once a daily E11.65 12/01/18   Ronnell Freshwater, NP  acetaminophen (TYLENOL) 650 MG CR tablet Take 1,300 mg by mouth every 8 (eight) hours as needed for pain.    [provider]  albuterol (PROAIR HFA) 108 (90 Base) MCG/ACT inhaler Inhale 2 puffs into the lungs every 6 (six) hours as needed. 02/04/19   Ronnell Freshwater, NP  allopurinol (ZYLOPRIM) 100 MG tablet Take 1 tablet (100 mg total) by mouth 2 (two) times daily. 05/01/19   Ronnell Freshwater, NP  apixaban (ELIQUIS) 5 MG TABS tablet Take 1 tablet (5 mg total) by mouth 2 (two) times daily. 02/18/19   Minna Merritts, MD  atenolol (TENORMIN) 25 MG tablet Take 2 tablets (50 mg) in the morning and 1 tablet (25 mg) in the evening 09/04/19   Theora Gianotti, NP  atorvastatin (LIPITOR) 20 MG tablet Take 1 tablet by mouth once daily 07/20/19   Minna Merritts, MD  Blood Glucose Monitoring Suppl (ACCU-CHEK AVIVA PLUS) w/Device KIT Use as directed 12/01/18   Ronnell Freshwater, NP  calcium carbonate (TUMS - DOSED IN MG ELEMENTAL CALCIUM) 500 MG chewable tablet Chew 1-2 tablets by mouth 3 (three) times daily as needed for indigestion or heartburn.    [provider]  cyclobenzaprine  (FLEXERIL) 10 MG tablet Take 1 tablet (10 mg total) by mouth 2 (two) times daily at 10 AM and 5 PM. 07/20/19   Boscia, Greer Ee, NP  diclofenac sodium (VOLTAREN) 1 % GEL Apply 1 application topically 4 (four) times daily as needed for pain. 03/05/19   [provider]  doxycycline (VIBRA-TABS) 100 MG tablet Take 1 tablet (100 mg total) by mouth daily. 08/10/19   Ronnell Freshwater, NP  escitalopram (LEXAPRO) 5 MG tablet Take 1 tablet (5 mg total) by mouth daily. 05/09/18   Ronnell Freshwater, NP  Fluticasone-Salmeterol (ADVAIR) 250-50 MCG/DOSE AEPB Inhale 1 puff into the lungs 2 (two) times daily. 03/02/19   Kendell Bane, NP  furosemide (LASIX) 20 MG tablet Take 2 tablets (40 mg) by mouth once every other day alternating with 1 tablet (20 mg) by mouth once every other day. Patient taking differently: Take 20-40 mg by mouth See admin instructions. Take 1 tablet (20 mg) by mouth scheduled daily, if patient experiencing swelling in legs will increase to 2 tablets (40 mg) by mouth in the morning. 02/12/19   Dunn, Areta Haber, PA-C  gabapentin (NEURONTIN) 400 MG capsule Take 400 mg by mouth 4 (four) times daily.     [provider]  glimepiride (AMARYL) 1 MG tablet Take 2 tablets (2 mg total) by mouth daily with supper. 05/29/19   Ronnell Freshwater, NP  glucose blood (ACCU-CHEK AVIVA PLUS) test strip 1 each by Other route as needed for other. Use as instructed 01/24/18   Ronnell Freshwater, NP  glucose blood (ACCU-CHEK AVIVA PLUS) test strip Pt needs accu check Aviva plus lancets, test strips and meter to check blood sugars once daily 12/01/18   Ronnell Freshwater, NP  hydroxypropyl methylcellulose / hypromellose (ISOPTO TEARS / GONIOVISC) 2.5 % ophthalmic solution Place 1 drop into both eyes 4 (four) times daily as needed (eye irritation (sand like feeling)).     [provider]  ipratropium (ATROVENT) 0.02 % nebulizer solution Use every 6 hours for Shortness of breath and wheezing and as  needed.  Prescription is for 90 days 05/06/19   Kendell Bane, NP  isosorbide mononitrate (IMDUR) 60 MG 24 hr tablet Take 1 tablet (60 mg) by mouth once daily at bedtime 07/21/19   Ronnell Freshwater, NP  loratadine (CLARITIN) 10 MG tablet Take 10 mg by mouth daily.  [provider]  meloxicam (MOBIC) 7.5 MG tablet Take 1 tablet (7.5 mg total) by mouth 2 (two) times a day. 03/30/19   Ronnell Freshwater, NP  metFORMIN (GLUCOPHAGE) 500 MG tablet Take 1 tablet (500 mg total) by mouth 2 (two) times daily with a meal. 08/05/19   Boscia, Greer Ee, NP  metroNIDAZOLE (FLAGYL) 500 MG tablet Take 1 tablet (500 mg total) by mouth 3 (three) times daily. 09/16/19   Ronnell Freshwater, NP  nitroGLYCERIN (NITROSTAT) 0.4 MG SL tablet Place 0.4 mg under the tongue every 5 (five) minutes x 3 doses as needed for chest pain.     [provider]  nystatin (MYCOSTATIN) 100000 UNIT/ML suspension Take 5 mLs (500,000 Units total) by mouth 4 (four) times daily as needed (mouth/throat irritation.). 06/26/19   Ronnell Freshwater, NP  oxyCODONE-acetaminophen (PERCOCET) 10-325 MG tablet Take 1 tablet by mouth 4 (four) times daily.    [provider]  OXYGEN Inhale 2 L into the lungs continuous.     [provider]  traZODone (DESYREL) 100 MG tablet Take 1 tablet (100 mg total) by mouth at bedtime. 08/05/19   Ronnell Freshwater, NP  umeclidinium bromide (INCRUSE ELLIPTA) 62.5 MCG/INH AEPB Inhale 1 puff into the lungs daily. 03/19/19   Ronnell Freshwater, NP    Review of Systems    He denies chest pain, pnd, orthopnea, n, v, syncope, or early satiety.   He reports presyncope / dizziness, DOE/SOB, edema, weight gain, and increased / progressive fatigue. No s/sx of bleeding. All other systems reviewed and are otherwise negative except as noted above.  Physical Exam    VS:  BP 110/70 (BP Location: Left Arm, Patient Position: Sitting, Cuff Size: Normal)    Pulse (!) 129    Ht 5' 10" (1.778 m)    Wt 280  lb 8 oz (127.2 kg)    SpO2 93% Comment: on 2L O2   BMI 40.25 kg/m  , BMI Body mass index is 40.25 kg/m. GEN: Well nourished, well developed, in no acute distress. Appears fatigued on exam. HEENT: normal. Neck: Supple, JVD difficult to assess 2/2 body habitus. No carotid bruits, or masses. Cardiac: IRIR with tachycardic rate, no murmurs, rubs, or gallops. No clubbing, cyanosis. 2-3+ bilateral LEE.  Radials/DP/PT 2+ and equal bilaterally.  Respiratory:  Reduced breath sounds bilaterally otherwise CTAB. Respirations regular and unlabored (on oxygen). GI: Not distended or firm, not TTP, BS + x 4. MS: no deformity or atrophy. Skin: warm and dry, no rash. Neuro:  Strength and sensation are intact. Psych: Normal affect.  Accessory Clinical Findings    ECG personally reviewed by me today - atrial flutter with RVR, 129bpm, low voltage - no acute changes.  Filed Weights   09/22/19 0908  Weight: 280 lb 8 oz (127.2 kg)     2D echo 10/09/2018: - Left ventricle: The cavity size was mildly dilated. Systolic function was normal. The estimated ejection fraction was in the range of 60% to 65%. Wall motion was normal; there were no regional wall motion abnormalities. Features are consistent with a pseudonormal left ventricular filling pattern, with concomitant abnormal relaxation and increased filling pressure (grade 2 diastolic dysfunction). - Left atrium: The atrium was mildly dilated. - Right ventricle: Systolic function was normal. - Pulmonary arteries: Systolic pressure was within the normal range.  Impressions:  - Challenging image quality. Rhythym appears to be NSR. __________  [facility] 08/2014: Left main 10% stenosis, mid LAD sequential 20% stenosis, D1  20% stenosis, mid LCx lesion 30% stenosis, mid LCx lesion to 50% stenosis, small OM1 20% stenosis, large OM to 20% stenosis, proximal RCA sequential 20% stenosis __________  Most recent labs 06/2019 A1C 6.6 02/2019 Na  139, K 4.6, Cr 0.94, BUN 19, WBC 8.1, Hgb 13.8, plts 150, 09/07/2019 COVID-19 negative   Assessment & Plan    Recurrent Atrial flutter with rapid ventricular rate  --Very symptomatic in atrial flutter with rapid rate. S/p multiple cardioversions. --Rate not well controlled. Per telephone recommendations, he has increased his atenolol without significant improvement in rates. He notes consistent rates higher than 130s at rest. Escalation of BB precluded by BP and report of presyncope today. Suspect current elevated rates are exacerbated by recent prednisone use and illness, as well as suspected volume overload in prolonged tachycardic rate. Also discussed that albuterol can increase his rates as well. TSH 0.924. --Case discussed today with EP today given referral for consideration for ablation. Dr. Caryl Comes will reach out to Dr. Rayann Heman next week to review case and EKGs. Informed patient that we will call him with updated plan next week / next Tuesday and after EP discusses his case.  --CHA2DS2VASc score of at least 5 (HFpEF, HTN, agex1, DM2, AAA/CAD). Patient reports missing Eliquis doses. He does not remember missing them but often has pills left over in the bottle when he is due for a refill. Stressed the importance of taking his anticoagulation twice daily as prescribed and not missing any doses. Reviewed risk of clot / thrombotic event / stroke, especially given he is also a current smoker. Discussed trying pill boxes and phone alarms to ensure he does not miss any doses going forward. --No medication changes. Update regarding decision to proceed with ablation pending EP to meet and discuss his case. Estimated updates next Tuesday 1/12.  Fatigue / Somnolence --As above, suspect that elevated rates are playing a role in his fatigue. Also discussed the role of deconditioning and recommendation for lifestyle changes. Smoking cessation and glycemic control stressed.  CAD without angina --No CP. No recent  need for SL nitro. Tolerating Imdur well. Continue CDMT with BB and statin. No ASA as on Eliquis. No s/sx of bleeding. No plans for further ischemic workup at this time. Continue medications.   Acute on Chronic HFpEF --Reports progressive edema and weight with need for constant oxygen and 93% on 2L chronic oxygen today. Weight today is increased from previous clinic weights with patient reporting slow increase in home weight and DOE/SOB at home. He feels volume overloaded with 2-3+ bilateral edema noted on exam. Will increase lasix from 77m qd alternating with 221mqd to 4078m3 days then drop back down to regular alternating schedule. Will recheck BMET in 1 week.   OSA on CPAP --Encouraged continued CPAP use.  HTN --BP well controlled to soft in rapid ventricular rate. Continue to monitor at home. Continued BP control recommended given history of AAA.   AAA --Continue BP control. Avoid quinolones. Continue to monitor.  Tobacco use --Continues to smoke ~3 cigarettes daily but reports that he does not smoke them completely. Stressed the benefits of smoking cessation. Patient will continue to attempt to cut back.   COPD on oxygen --Consider as contributing to sx. He has increased home oxygen from intermittent PM use to 24/7 use with oxygen saturations still low today. COVID-19 test negative. Follow-up with pulmonology as directed.   DM2, poorly controlled --A1C 6.6. Encouraged glycemic control for risk factor modification.   HLD --Intolerant  to statins in the past. Tolerating atorvastatin at this time.  Transportation Barriers to Care --Will need to investigate transportation options if proceed to ablation as above in HPI.  Morbid obesity --Patient reports physical activity limited by current symptoms. Discussed lifestyle changes and slow escalation of activity as tolerated.    Arvil Chaco, PA-C 09/22/2019, 8:39 PM

## 2019-09-22 ENCOUNTER — Other Ambulatory Visit: Payer: Self-pay

## 2019-09-22 ENCOUNTER — Ambulatory Visit (INDEPENDENT_AMBULATORY_CARE_PROVIDER_SITE_OTHER): Payer: Medicare HMO | Admitting: Physician Assistant

## 2019-09-22 ENCOUNTER — Encounter: Payer: Self-pay | Admitting: Physician Assistant

## 2019-09-22 VITALS — BP 110/70 | HR 129 | Ht 70.0 in | Wt 280.5 lb

## 2019-09-22 DIAGNOSIS — E785 Hyperlipidemia, unspecified: Secondary | ICD-10-CM | POA: Diagnosis not present

## 2019-09-22 DIAGNOSIS — I2510 Atherosclerotic heart disease of native coronary artery without angina pectoris: Secondary | ICD-10-CM | POA: Diagnosis not present

## 2019-09-22 DIAGNOSIS — G4733 Obstructive sleep apnea (adult) (pediatric): Secondary | ICD-10-CM

## 2019-09-22 DIAGNOSIS — I714 Abdominal aortic aneurysm, without rupture, unspecified: Secondary | ICD-10-CM

## 2019-09-22 DIAGNOSIS — I483 Typical atrial flutter: Secondary | ICD-10-CM | POA: Diagnosis not present

## 2019-09-22 DIAGNOSIS — Z748 Other problems related to care provider dependency: Secondary | ICD-10-CM

## 2019-09-22 DIAGNOSIS — I5033 Acute on chronic diastolic (congestive) heart failure: Secondary | ICD-10-CM | POA: Diagnosis not present

## 2019-09-22 DIAGNOSIS — Z79899 Other long term (current) drug therapy: Secondary | ICD-10-CM

## 2019-09-22 DIAGNOSIS — F17200 Nicotine dependence, unspecified, uncomplicated: Secondary | ICD-10-CM

## 2019-09-22 DIAGNOSIS — R42 Dizziness and giddiness: Secondary | ICD-10-CM

## 2019-09-22 DIAGNOSIS — R2681 Unsteadiness on feet: Secondary | ICD-10-CM | POA: Diagnosis not present

## 2019-09-22 DIAGNOSIS — E1165 Type 2 diabetes mellitus with hyperglycemia: Secondary | ICD-10-CM

## 2019-09-22 NOTE — Patient Instructions (Signed)
Medication Instructions:  1- INCREASE Lasix to 40 mg total once daily for 3 days only, then resume normal schedule.   *If you need a refill on your cardiac medications before your next appointment, please call your pharmacy*  Lab Work: Your physician recommends that you return for lab work in: 1week [facility] mall. ([facility]) No appt is needed. Hours are M-F 7AM- 6 PM.  If you have labs (blood work) drawn today and your tests are completely normal, you will receive your results only by: Marland Kitchen MyChart Message (if you have MyChart) OR . A paper copy in the mail If you have any lab test that is abnormal or we need to change your treatment, we will call you to review the results.  Testing/Procedures: None ordered   Follow-Up: At [facility], you and your health needs are our priority.  As part of our continuing mission to provide you with exceptional heart care, we have created designated Provider Care Teams.  These Care Teams include your primary Cardiologist (physician) and Advanced Practice Providers (APPs -  Physician Assistants and Nurse Practitioners) who all work together to provide you with the care you need, when you need it.  Your next appointment:   Follow up based on call from Elenor Quinones, PA

## 2019-09-25 ENCOUNTER — Other Ambulatory Visit: Payer: Self-pay

## 2019-09-25 ENCOUNTER — Other Ambulatory Visit
Admission: RE | Admit: 2019-09-25 | Discharge: 2019-09-25 | Disposition: A | Discharge status: Home / Self Care | Payer: Medicare HMO | Source: Ambulatory Visit | Attending: Physician Assistant | Admitting: Physician Assistant

## 2019-09-25 DIAGNOSIS — I2510 Atherosclerotic heart disease of native coronary artery without angina pectoris: Secondary | ICD-10-CM | POA: Diagnosis not present

## 2019-09-25 LAB — BASIC METABOLIC PANEL
Anion gap: 11 (ref 5–15)
BUN: 13 mg/dL (ref 8–23)
CO2: 28 mmol/L (ref 22–32)
Calcium: 9 mg/dL (ref 8.9–10.3)
Chloride: 97 mmol/L — ABNORMAL LOW (ref 98–111)
Creatinine, Ser: 0.87 mg/dL (ref 0.61–1.24)
GFR calc Af Amer: >60 mL/min (ref >60)
GFR calc non Af Amer: >60 mL/min (ref >60)
Glucose, Bld: 180 mg/dL — ABNORMAL HIGH (ref 70–99)
Potassium: 4.1 mmol/L (ref 3.5–5.1)
Sodium: 136 mmol/L (ref 135–145)

## 2019-09-25 NOTE — Telephone Encounter (Signed)
Patient received meds on 09/16/2019

## 2019-09-28 ENCOUNTER — Telehealth: Payer: Self-pay

## 2019-09-28 NOTE — Telephone Encounter (Signed)
Call to patient to discuss lab results and POC.   Pt verbalized understanding and had no further questions at this time.   Advised pt to call for any further questions or concerns.

## 2019-09-28 NOTE — Telephone Encounter (Signed)
-----   Message from Arvil Chaco, PA-C sent at 09/25/2019  5:46 PM EST ----- Please let Mr. Mcgavock know that his renal function and electrolytes are stable from that of previous labs. We will recheck his kidney function in 1 week as planned (since we had him go up on his lasix for 3 days). Hopefully, he is feeling better after this change. Please let me know if he has any questions at all.

## 2019-10-02 ENCOUNTER — Other Ambulatory Visit
Admission: RE | Admit: 2019-10-02 | Discharge: 2019-10-02 | Disposition: A | Discharge status: Home / Self Care | Payer: Medicare HMO | Source: Ambulatory Visit | Attending: Internal Medicine | Admitting: Internal Medicine

## 2019-10-02 ENCOUNTER — Telehealth: Payer: Self-pay | Admitting: Cardiovascular Disease

## 2019-10-02 DIAGNOSIS — Z01810 Encounter for preprocedural cardiovascular examination: Secondary | ICD-10-CM | POA: Insufficient documentation

## 2019-10-02 DIAGNOSIS — I483 Typical atrial flutter: Secondary | ICD-10-CM | POA: Diagnosis not present

## 2019-10-02 LAB — BASIC METABOLIC PANEL
Anion gap: 10 (ref 5–15)
BUN: 17 mg/dL (ref 8–23)
CO2: 27 mmol/L (ref 22–32)
Calcium: 8.8 mg/dL — ABNORMAL LOW (ref 8.9–10.3)
Chloride: 101 mmol/L (ref 98–111)
Creatinine, Ser: 0.99 mg/dL (ref 0.61–1.24)
GFR calc Af Amer: >60 mL/min (ref >60)
GFR calc non Af Amer: >60 mL/min (ref >60)
Glucose, Bld: 232 mg/dL — ABNORMAL HIGH (ref 70–99)
Potassium: 4.3 mmol/L (ref 3.5–5.1)
Sodium: 138 mmol/L (ref 135–145)

## 2019-10-02 LAB — CBC WITH DIFFERENTIAL/PLATELET
Abs Immature Granulocytes: 0.06 10*3/uL (ref 0.00–0.07)
Basophils Absolute: 0.1 10*3/uL (ref 0.0–0.1)
Basophils Relative: 1 %
Eosinophils Absolute: 0.3 10*3/uL (ref 0.0–0.5)
Eosinophils Relative: 3 %
HCT: 40 % (ref 39.0–52.0)
Hemoglobin: 13 g/dL (ref 13.0–17.0)
Immature Granulocytes: 1 %
Lymphocytes Relative: 45 %
Lymphs Abs: 3.6 10*3/uL (ref 0.7–4.0)
MCH: 31.5 pg (ref 26.0–34.0)
MCHC: 32.5 g/dL (ref 30.0–36.0)
MCV: 96.9 fL (ref 80.0–100.0)
Monocytes Absolute: 0.8 10*3/uL (ref 0.1–1.0)
Monocytes Relative: 10 %
Neutro Abs: 3.2 10*3/uL (ref 1.7–7.7)
Neutrophils Relative %: 40 %
Platelets: 168 10*3/uL (ref 150–400)
RBC: 4.13 MIL/uL — ABNORMAL LOW (ref 4.22–5.81)
RDW: 16.8 % — ABNORMAL HIGH (ref 11.5–15.5)
WBC: 7.9 10*3/uL (ref 4.0–10.5)
nRBC: 0 % (ref 0.0–0.2)

## 2019-10-02 NOTE — Telephone Encounter (Signed)
Patient calling States he was told at last visit to get lab work [facility] but at lab was told there were no recent orders Lab drew old lab orders that were requested from Dr Caryl Comes  Please review and call to discuss if there are other labs to be completed

## 2019-10-05 ENCOUNTER — Other Ambulatory Visit: Payer: Self-pay | Admitting: Cardiovascular Disease

## 2019-10-07 DIAGNOSIS — G894 Chronic pain syndrome: Secondary | ICD-10-CM | POA: Diagnosis not present

## 2019-10-07 DIAGNOSIS — Z5181 Encounter for therapeutic drug level monitoring: Secondary | ICD-10-CM | POA: Diagnosis not present

## 2019-10-07 DIAGNOSIS — M545 Low back pain: Secondary | ICD-10-CM | POA: Diagnosis not present

## 2019-10-07 DIAGNOSIS — Z79899 Other long term (current) drug therapy: Secondary | ICD-10-CM | POA: Diagnosis not present

## 2019-10-07 DIAGNOSIS — M47896 Other spondylosis, lumbar region: Secondary | ICD-10-CM | POA: Diagnosis not present

## 2019-10-08 ENCOUNTER — Other Ambulatory Visit: Payer: Self-pay

## 2019-10-08 NOTE — Patient Outreach (Signed)
Lawrence Steele) Care Management  [name]  10/08/2019   Lawrence Steele 12/30/1950 [id number]  Subjective: Telephone call to patient for quarterly check in. Patient reports he is doing pretty good.  He reports some problems with A. Fib.  again.  He is having lab work done for possible upcoming procedure for his A. Fib.  He states that his sugars have run high with being on steroids sometimes.  Discussed diet and exercise being key to controlling blood sugars. He verbalized understanding.  He states his COPD is ok but has some problems at times due to A. Fib. He voices no needs at this time.  Objective:   Encounter Medications:  Outpatient Encounter Medications as of 10/08/2019  Medication Sig  . Accu-Chek Softclix Lancets lancets Use as instructed  Once a daily E11.65  . acetaminophen (TYLENOL) 650 MG CR tablet Take 1,300 mg by mouth every 8 (eight) hours as needed for pain.  Marland Kitchen albuterol (PROAIR HFA) 108 (90 Base) MCG/ACT inhaler Inhale 2 puffs into the lungs every 6 (six) hours as needed.  Marland Kitchen allopurinol (ZYLOPRIM) 100 MG tablet Take 1 tablet (100 mg total) by mouth 2 (two) times daily.  Marland Kitchen apixaban (ELIQUIS) 5 MG TABS tablet Take 1 tablet (5 mg total) by mouth 2 (two) times daily.  Marland Kitchen atenolol (TENORMIN) 25 MG tablet Take 2 tablets (50 mg) in the morning and 1 tablet (25 mg) in the evening,  . atorvastatin (LIPITOR) 20 MG tablet Take 1 tablet by mouth once daily  . Blood Glucose Monitoring Suppl (ACCU-CHEK AVIVA PLUS) w/Device KIT Use as directed  . calcium carbonate (TUMS - DOSED IN MG ELEMENTAL CALCIUM) 500 MG chewable tablet Chew 1-2 tablets by mouth 3 (three) times daily as needed for indigestion or heartburn.  . cyclobenzaprine (FLEXERIL) 10 MG tablet Take 1 tablet (10 mg total) by mouth 2 (two) times daily at 10 AM and 5 PM.  . diclofenac sodium (VOLTAREN) 1 % GEL Apply 1 application topically 4 (four) times daily as needed for pain.  Marland Kitchen doxycycline (VIBRA-TABS) 100  MG tablet Take 1 tablet (100 mg total) by mouth daily.  Marland Kitchen escitalopram (LEXAPRO) 5 MG tablet Take 1 tablet (5 mg total) by mouth daily.  . Fluticasone-Salmeterol (ADVAIR) 250-50 MCG/DOSE AEPB Inhale 1 puff into the lungs 2 (two) times daily.  . furosemide (LASIX) 20 MG tablet Take 2 tablets (40 mg) by mouth once every other day alternating with 1 tablet (20 mg) by mouth once every other day. (Patient taking differently: Take 20-40 mg by mouth See admin instructions. Take 1 tablet (20 mg) by mouth scheduled daily, if patient experiencing swelling in legs will increase to 2 tablets (40 mg) by mouth in the morning.)  . gabapentin (NEURONTIN) 400 MG capsule Take 400 mg by mouth 4 (four) times daily.   Marland Kitchen glimepiride (AMARYL) 1 MG tablet Take 2 tablets (2 mg total) by mouth daily with supper.  Marland Kitchen glucose blood (ACCU-CHEK AVIVA PLUS) test strip Pt needs accu check Aviva plus lancets, test strips and meter to check blood sugars once daily  . hydroxypropyl methylcellulose / hypromellose (ISOPTO TEARS / GONIOVISC) 2.5 % ophthalmic solution Place 1 drop into both eyes 4 (four) times daily as needed (eye irritation (sand like feeling)).   Marland Kitchen ipratropium (ATROVENT) 0.02 % nebulizer solution Use every 6 hours for Shortness of breath and wheezing and as needed.  Prescription is for 90 days  . isosorbide mononitrate (IMDUR) 60 MG 24 hr tablet Take  1 tablet (60 mg) by mouth once daily at bedtime  . loratadine (CLARITIN) 10 MG tablet Take 10 mg by mouth daily.  . meloxicam (MOBIC) 7.5 MG tablet Take 1 tablet (7.5 mg total) by mouth 2 (two) times a day.  . metFORMIN (GLUCOPHAGE) 500 MG tablet Take 1 tablet (500 mg total) by mouth 2 (two) times daily with a meal.  . metroNIDAZOLE (FLAGYL) 500 MG tablet Take 1 tablet (500 mg total) by mouth 3 (three) times daily.  . nitroGLYCERIN (NITROSTAT) 0.4 MG SL tablet Place 0.4 mg under the tongue every 5 (five) minutes x 3 doses as needed for chest pain.   Marland Kitchen nystatin (MYCOSTATIN)  100000 UNIT/ML suspension Take 5 mLs (500,000 Units total) by mouth 4 (four) times daily as needed (mouth/throat irritation.).  Marland Kitchen oxyCODONE-acetaminophen (PERCOCET) 10-325 MG tablet Take 1 tablet by mouth 4 (four) times daily.  . OXYGEN Inhale 2 L into the lungs continuous.   . traZODone (DESYREL) 100 MG tablet Take 1 tablet (100 mg total) by mouth at bedtime.  Marland Kitchen umeclidinium bromide (INCRUSE ELLIPTA) 62.5 MCG/INH AEPB Inhale 1 puff into the lungs daily.  Marland Kitchen glucose blood (ACCU-CHEK AVIVA PLUS) test strip 1 each by Other route as needed for other. Use as instructed (Patient not taking: Reported on 09/22/2019)   No facility-administered encounter medications on file as of 10/08/2019.    Functional Status:  In your present state of health, do you have any difficulty performing the following activities: 10/08/2019 08/21/2019  Hearing? N N  Vision? N N  Difficulty concentrating or making decisions? N N  Walking or climbing stairs? N N  Comment - -  Dressing or bathing? N N  Doing errands, shopping? Y N  Comment family helps -  Conservation officer, nature and eating ? N N  Using the Toilet? N N  In the past six months, have you accidently leaked urine? N N  Do you have problems with loss of bowel control? N N  Managing your Medications? N N  Managing your Finances? N N  Comment - -  Housekeeping or managing your Housekeeping? Y N  Comment family helps -  Some recent data might be hidden    Fall/Depression Screening: Fall Risk  10/08/2019 08/21/2019 08/21/2019  Falls in the past year? 0 1 0  Comment - - -  Number falls in past yr: - 0 -  Injury with Fall? - 1 -  Comment - - -  Risk for fall due to : - - -  Follow up - - -   PHQ 2/9 Scores 08/21/2019 08/21/2019 07/14/2019 06/09/2019 03/31/2019 03/05/2019 12/30/2018  PHQ - 2 Score 0 0 0 0 0 0 0  PHQ- 9 Score - - - - - - -    Assessment: Patient continues to manage chronic diseases and benefits from disease management follow ups.    Plan:  [facility] CM Care  Plan Problem One     Most Recent Value  Care Plan Problem One  Knowledge Deficit in Self Management of Diabetes  Role Documenting the Problem One  Care Management Telephonic Coordinator  Care Plan for Problem One  Active  [facility] Long Term Goal   Patient will  Patient will see a decrease in A1C in A1C from 6.9 within the next 90 days  [facility] Long Term Goal Start Date  10/08/19  Interventions for Problem One Long Term Goal  Patient A1c 6.6 last check.  Patient reports being on steroids that cause sugars to run  higher.  Discussed diet and exercise.    [facility] CM Care Plan Problem Two     Most Recent Value  Care Plan Problem Two  chronic back pain-self care  Role Documenting the Problem Two  Care Management Coordinator  Care Plan for Problem Two  Not Active    [facility] CM Care Plan Problem Three     Most Recent Value  Care Plan Problem Three  high risk for hospitalization related to new onset of Aflutter with RVR  Role Documenting the Problem Three  Care Management [place] for Problem Three  Active     RN CM will contact patient again in the month of April and patient agreeable.   Jone Baseman, RN, MSN [facility] Care Management Coordinator Direct Line [phone] Cell [phone] Toll Free: [phone]  Fax: [phone]

## 2019-10-13 ENCOUNTER — Telehealth: Payer: Self-pay | Admitting: Physician Assistant

## 2019-10-13 NOTE — Telephone Encounter (Signed)
I spoke with the patient regarding his lab results. I advised him Dr. Caryl Comes stated he had tried to reach him by phone last week, but this went to voice mail. Per Dr. Caryl Comes, he is going to try to call the patient this afternoon. The patient was made aware.

## 2019-10-13 NOTE — Telephone Encounter (Signed)
Patient would like lab results.

## 2019-10-15 ENCOUNTER — Telehealth: Payer: Self-pay | Admitting: Internal Medicine

## 2019-10-15 NOTE — Telephone Encounter (Signed)
I spoke with Dr. Caryl Comes in clinic this morning regarding the patient. He states he spoke with Dr. Lovena Le and Dr. Rayann Heman about the patient's atrial flutter and they both felt he would be a difficult candidate for ablation with his multiple flutters.  Dr. Caryl Comes states he called and spoke with the patient and advised him of the above.  He has advised the patient that he would like him to start on Multaq 400 mg BID, but would check with me to see if we have samples to start him on due to cost of the drug.   I advised Dr. Caryl Comes we do have Multaq samples in the office. He would like [place] to give a 2 week supply to the patient and then arrange for a telehealth visit in 2 weeks.  I attempted to call the patient. No answer- I left a message to please call back if he could. If I do not hear from him this morning, I will try to call him back later today.  Samples pulled: Multaq 400 mg Lot: [id number] Exp: 1/22  # 5 boxes (3 day supply each)

## 2019-10-15 NOTE — Telephone Encounter (Signed)
Patient returning call.

## 2019-10-16 ENCOUNTER — Encounter: Payer: Self-pay | Admitting: *Deleted

## 2019-10-16 MED ORDER — DRONEDARONE HCL 400 MG PO TABS: 400.0000 mg | ORAL_TABLET | Freq: Two times a day (BID) | ORAL | 0 refills | Status: DC

## 2019-10-16 NOTE — Telephone Encounter (Signed)
I spoke with the patient. He is aware that I have pulled Multaq 400 mg BID samples for him to take.   He is advised to pick these up in the office today. He states his daughter, [name] will come get them.  I have asked that he take his 1st dose tonight, then take 1 tablet (400 mg) every 12 hours after that.   He is also aware that Dr. Caryl Comes would like to do a telehealth visit with him in 2 weeks.  He is agreeable with an appt on 11/03/19 at [facility].   I have pulled 1 additional box of samples for him to cover him until his appt.  Multaq 400 mg Lot: [id number] Exp: 09/2020 # 1 box

## 2019-10-16 NOTE — Telephone Encounter (Signed)
I called and spoke with the patient's daughter and answered her questions in regards to Multaq. She was very appreciative for the call back.

## 2019-10-16 NOTE — Telephone Encounter (Signed)
Patients daughter calling in to discuss some of the medication side effects'  Please advise

## 2019-10-21 ENCOUNTER — Telehealth: Payer: Self-pay

## 2019-10-21 NOTE — Telephone Encounter (Signed)
CONFIRMED AND SCREENED FOR 10-23-19 OV. 

## 2019-10-23 ENCOUNTER — Encounter: Payer: Self-pay | Admitting: Nurse Practitioner

## 2019-10-23 ENCOUNTER — Other Ambulatory Visit: Payer: Self-pay

## 2019-10-23 ENCOUNTER — Ambulatory Visit (INDEPENDENT_AMBULATORY_CARE_PROVIDER_SITE_OTHER): Payer: Medicare HMO | Admitting: Nurse Practitioner

## 2019-10-23 VITALS — BP 114/70 | HR 82 | Temp 97.3°F | Resp 16 | Ht 70.0 in | Wt 275.0 lb

## 2019-10-23 DIAGNOSIS — I4891 Unspecified atrial fibrillation: Secondary | ICD-10-CM | POA: Diagnosis not present

## 2019-10-23 DIAGNOSIS — B351 Tinea unguium: Secondary | ICD-10-CM | POA: Diagnosis not present

## 2019-10-23 DIAGNOSIS — J441 Chronic obstructive pulmonary disease with (acute) exacerbation: Secondary | ICD-10-CM | POA: Diagnosis not present

## 2019-10-23 DIAGNOSIS — E1165 Type 2 diabetes mellitus with hyperglycemia: Secondary | ICD-10-CM | POA: Diagnosis not present

## 2019-10-23 LAB — POCT GLYCOSYLATED HEMOGLOBIN (HGB A1C): Hemoglobin A1C: 7.3 % — AB (ref 4.0–5.6)

## 2019-10-23 MED ORDER — CEFUROXIME AXETIL 500 MG PO TABS: 500.0000 mg | ORAL_TABLET | Freq: Two times a day (BID) | ORAL | 0 refills | Status: DC

## 2019-10-23 NOTE — Progress Notes (Signed)
[facility] [place], [place] [address]  Internal MEDICINE  Office Visit Note  Patient Name: Lawrence Steele  [id number]  [id number]  Date of Service: 10/23/2019  Chief Complaint  Patient presents with  . Diabetes  . Hypertension  . Hyperlipidemia  . Referral    podiatrist, needs foot exam     The patient is here for routine follow up visit. Blood sugars are a little elevated. He states that he had to be on oral steroids recently. Blood sugars were a bit elevated. Continues to have a-fib. Recently started on new medication called Multaq. Feels like it has slowed his heart down some. Tolerating the change well. Has appointment with cardiology later this month for follow up.  States that back and hip pain are getting worse. He does see pain management to help with pain control. Has had injections for hip and spine. Did not really help at all. He is now on prescription for pain medication. They are also prescribing his gabapentin.  The patient states that he has been nticing issues with his toenails, especially the toenail of the great toe of left foot. Nail is very thick and ingrown and needs to have it removed.       Current Medication: Outpatient Encounter Medications as of 10/23/2019  Medication Sig  . Accu-Chek Softclix Lancets lancets Use as instructed  Once a daily E11.65  . acetaminophen (TYLENOL) 650 MG CR tablet Take 1,300 mg by mouth every 8 (eight) hours as needed for pain.  Marland Kitchen albuterol (PROAIR HFA) 108 (90 Base) MCG/ACT inhaler Inhale 2 puffs into the lungs every 6 (six) hours as needed.  Marland Kitchen allopurinol (ZYLOPRIM) 100 MG tablet Take 1 tablet (100 mg total) by mouth 2 (two) times daily.  Marland Kitchen apixaban (ELIQUIS) 5 MG TABS tablet Take 1 tablet (5 mg total) by mouth 2 (two) times daily.  Marland Kitchen atenolol (TENORMIN) 25 MG tablet Take 2 tablets (50 mg) in the morning and 1 tablet (25 mg) in the evening,  . atorvastatin (LIPITOR) 20 MG tablet Take 1 tablet by mouth  once daily  . Blood Glucose Monitoring Suppl (ACCU-CHEK AVIVA PLUS) w/Device KIT Use as directed  . calcium carbonate (TUMS - DOSED IN MG ELEMENTAL CALCIUM) 500 MG chewable tablet Chew 1-2 tablets by mouth 3 (three) times daily as needed for indigestion or heartburn.  . cyclobenzaprine (FLEXERIL) 10 MG tablet Take 1 tablet (10 mg total) by mouth 2 (two) times daily at 10 AM and 5 PM.  . diclofenac sodium (VOLTAREN) 1 % GEL Apply 1 application topically 4 (four) times daily as needed for pain.  Marland Kitchen doxycycline (VIBRA-TABS) 100 MG tablet Take 1 tablet (100 mg total) by mouth daily.  Marland Kitchen dronedarone (MULTAQ) 400 MG tablet Take 1 tablet (400 mg total) by mouth 2 (two) times daily with a meal.  . escitalopram (LEXAPRO) 5 MG tablet Take 1 tablet (5 mg total) by mouth daily.  . Fluticasone-Salmeterol (ADVAIR) 250-50 MCG/DOSE AEPB Inhale 1 puff into the lungs 2 (two) times daily.  . furosemide (LASIX) 20 MG tablet Take 2 tablets (40 mg) by mouth once every other day alternating with 1 tablet (20 mg) by mouth once every other day. (Patient taking differently: Take 20-40 mg by mouth See admin instructions. Take 1 tablet (20 mg) by mouth scheduled daily, if patient experiencing swelling in legs will increase to 2 tablets (40 mg) by mouth in the morning.)  . gabapentin (NEURONTIN) 400 MG capsule Take 400 mg by  mouth 4 (four) times daily.   Marland Kitchen glimepiride (AMARYL) 1 MG tablet Take 2 tablets (2 mg total) by mouth daily with supper.  Marland Kitchen glucose blood (ACCU-CHEK AVIVA PLUS) test strip 1 each by Other route as needed for other. Use as instructed  . glucose blood (ACCU-CHEK AVIVA PLUS) test strip Pt needs accu check Aviva plus lancets, test strips and meter to check blood sugars once daily  . hydroxypropyl methylcellulose / hypromellose (ISOPTO TEARS / GONIOVISC) 2.5 % ophthalmic solution Place 1 drop into both eyes 4 (four) times daily as needed (eye irritation (sand like feeling)).   Marland Kitchen ipratropium (ATROVENT) 0.02 %  nebulizer solution Use every 6 hours for Shortness of breath and wheezing and as needed.  Prescription is for 90 days  . isosorbide mononitrate (IMDUR) 60 MG 24 hr tablet Take 1 tablet (60 mg) by mouth once daily at bedtime  . loratadine (CLARITIN) 10 MG tablet Take 10 mg by mouth daily.  . meloxicam (MOBIC) 7.5 MG tablet Take 1 tablet (7.5 mg total) by mouth 2 (two) times a day.  . metFORMIN (GLUCOPHAGE) 500 MG tablet Take 1 tablet (500 mg total) by mouth 2 (two) times daily with a meal.  . metroNIDAZOLE (FLAGYL) 500 MG tablet Take 1 tablet (500 mg total) by mouth 3 (three) times daily.  . nitroGLYCERIN (NITROSTAT) 0.4 MG SL tablet Place 0.4 mg under the tongue every 5 (five) minutes x 3 doses as needed for chest pain.   Marland Kitchen nystatin (MYCOSTATIN) 100000 UNIT/ML suspension Take 5 mLs (500,000 Units total) by mouth 4 (four) times daily as needed (mouth/throat irritation.).  Marland Kitchen oxyCODONE-acetaminophen (PERCOCET) 10-325 MG tablet Take 1 tablet by mouth 4 (four) times daily.  . OXYGEN Inhale 2 L into the lungs continuous.   . traZODone (DESYREL) 100 MG tablet Take 1 tablet (100 mg total) by mouth at bedtime.  Marland Kitchen umeclidinium bromide (INCRUSE ELLIPTA) 62.5 MCG/INH AEPB Inhale 1 puff into the lungs daily.  . cefUROXime (CEFTIN) 500 MG tablet Take 1 tablet (500 mg total) by mouth 2 (two) times daily with a meal.   No facility-administered encounter medications on file as of 10/23/2019.    Surgical History: Past Surgical History:  Procedure Laterality Date  . ABDOMINAL AORTIC ANEURYSM REPAIR  03/06/2015   [facility]    . AMPUTATION Left    partial amputation  . APPENDECTOMY    . CARDIAC CATHETERIZATION  2010,03/16/2003,11/04/2002  . CARDIOVERSION N/A 04/24/2018   Procedure: CARDIOVERSION;  Surgeon: Nelva Bush, MD;  Location: [facility];  Service: Cardiovascular;  Laterality: N/A;  . CARDIOVERSION N/A 09/05/2018   Procedure: CARDIOVERSION;  Surgeon: Minna Merritts, MD;  Location:  [facility];  Service: Cardiovascular;  Laterality: N/A;  . CARDIOVERSION N/A 11/24/2018   Procedure: CARDIOVERSION (CATH LAB);  Surgeon: Wellington Hampshire, MD;  Location: [facility];  Service: Cardiovascular;  Laterality: N/A;  . CARDIOVERSION N/A 03/18/2019   Procedure: CARDIOVERSION;  Surgeon: Minna Merritts, MD;  Location: [facility];  Service: Cardiovascular;  Laterality: N/A;  . CORONARY ANGIOPLASTY  03/16/2003   stent to the RCA  & 2/18/2004stent mid circumflex  . ELBOW SURGERY    . HERNIA REPAIR    . NASAL SINUS SURGERY    . PENILE PROSTHESIS IMPLANT  1995  . PILONIDAL CYST EXCISION    . TEE WITHOUT CARDIOVERSION N/A 04/24/2018   Procedure: TRANSESOPHAGEAL ECHOCARDIOGRAM (TEE);  Surgeon: Nelva Bush, MD;  Location: [facility];  Service: Cardiovascular;  Laterality: N/A;  .  TONSILECTOMY, ADENOIDECTOMY, BILATERAL MYRINGOTOMY AND TUBES    . UMBILICAL HERNIA REPAIR      Medical History: Past Medical History:  Diagnosis Date  . AAA (abdominal aortic aneurysm) ([place])    a.  Status post endovascular repair at [facility] in 2016  . Anxiety   . Asthma   . Atrial flutter ([place])    a. diagnosed 7/19; b. CHADS2VASc => 5 (CHF, HTN, age x 1, DM, vascular disease); c. on Eliquis; d. s/p TEE/DCCV 8/19 with repeat DCCV 12/19  . Cancer (HCC)    melanoma L index finger   . Chronic diastolic CHF (congestive heart failure) ([place])   . COPD (chronic obstructive pulmonary disease) ([place])   . Coronary artery disease    a. LHC 5/10: left main 10%, mLAD-1 lesion 20%, mLAD-2 lesion 20%, D1 20%, mLCx-1 lesion 30%, mLCx-2 lesion 50%, OM1 20%, OM to 20%, LPL 20%, pRCA-1 lesion 20%, pRCA-2 lesion 20%.  EF 51% w/ mild inf HK  . Diabetes mellitus with complication ([place])   . Hyperlipidemia   . Hypertension   . Iliac artery aneurysm, bilateral ([place])   . Obstructive sleep apnea-hypopnea syndrome     Family History: Family History  Problem Relation Age of Onset  . Hypertension Mother   . Hyperlipidemia Mother   .  Depression Mother   . Heart attack Father 4  . Heart disease Father   . Hypertension Father   . Hyperlipidemia Father     Social History   Socioeconomic History  . Marital status: Divorced    Spouse name: Not on file  . Number of children: 2  . Years of education: 10   . Highest education level: 10th grade  Occupational History  . Occupation: disabled   Tobacco Use  . Smoking status: Current Every Day Smoker    Packs/day: 0.25    Years: 45.00    Pack years: 11.25    Types: Cigarettes  . Smokeless tobacco: Never Used  . Tobacco comment: pt hasnt smoked lately due to health but states he still smokes  Substance and Sexual Activity  . Alcohol use: Not Currently  . Drug use: No  . Sexual activity: Not on file  Other Topics Concern  . Not on file  Social History Narrative  . Not on file   Social Determinants of Health   Financial Resource Strain:   . Difficulty of Paying Living Expenses: Not on file  Food Insecurity:   . Worried About Charity fundraiser in the Last Year: Not on file  . Ran Out of Food in the Last Year: Not on file  Transportation Needs: No Transportation Needs  . Lack of Transportation (Medical): No  . Lack of Transportation (Non-Medical): No  Physical Activity:   . Days of Exercise per Week: Not on file  . Minutes of Exercise per Session: Not on file  Stress:   . Feeling of Stress : Not on file  Social Connections:   . Frequency of Communication with Friends and Family: Not on file  . Frequency of Social Gatherings with Friends and Family: Not on file  . Attends Religious Services: Not on file  . Active Member of Clubs or Organizations: Not on file  . Attends Archivist Meetings: Not on file  . Marital Status: Not on file  Intimate Partner Violence:   . Fear of Current or Ex-Partner: Not on file  . Emotionally Abused: Not on file  . Physically Abused: Not on file  .  Sexually Abused: Not on file      Review of Systems   Constitutional: Positive for fatigue. Negative for activity change and unexpected weight change.  HENT: Negative for postnasal drip, rhinorrhea, sore throat and voice change.   Respiratory: Positive for cough, shortness of breath and wheezing.        Intermittent wheezing.  Cardiovascular: Positive for palpitations. Negative for chest pain.       Seeing cardiology for chronic a-fib.   Gastrointestinal: Negative for constipation, diarrhea, nausea and vomiting.  Endocrine: Negative for cold intolerance, heat intolerance, polydipsia and polyuria.       Blood sugars have been slightly elevated recently. Had to take steroid taper and this will generally elevate his blood sugar.   Musculoskeletal: Positive for arthralgias, back pain and myalgias.  Skin: Negative for rash.       Long, thick, and ingrowing toenails. Worse on the left side.   Allergic/Immunologic: Positive for environmental allergies.  Neurological: Positive for headaches. Negative for seizures.  Hematological: Negative for adenopathy.  Psychiatric/Behavioral: Positive for dysphoric mood. The patient is nervous/anxious.     Today's Vitals   10/23/19 1020  BP: 114/70  Pulse: 82  Resp: 16  Temp: (!) 97.3 F (36.3 C)  SpO2: 92%  Weight: 275 lb (124.7 kg)  Height: 5' 10"  (1.778 m)   Body mass index is 39.46 kg/m.  Physical Exam Vitals and nursing note reviewed.  Constitutional:      Appearance: Normal appearance. He is well-developed.  HENT:     Head: Normocephalic.     Nose: Nose normal.  Eyes:     Conjunctiva/sclera: Conjunctivae normal.     Pupils: Pupils are equal, round, and reactive to light.  Neck:     Thyroid: No thyromegaly.     Vascular: No carotid bruit or JVD.     Trachea: No tracheal deviation.  Cardiovascular:     Rate and Rhythm: Normal rate. Rhythm irregular. Frequent extrasystoles are present.    Heart sounds: Murmur present. Systolic murmur present with a grade of 2/6.     Comments:  Intermittent premature beat.  Pulmonary:     Effort: Pulmonary effort is normal. No accessory muscle usage or respiratory distress.     Breath sounds: Examination of the right-upper field reveals rhonchi. Examination of the left-upper field reveals rhonchi. Examination of the right-middle field reveals rhonchi. Examination of the left-middle field reveals rhonchi. Wheezing and rhonchi present.     Comments: Rhonchi heard throughout the lung fields. Not clearing with cough. Tenderness along the right lower chest with mild palpation.  Abdominal:     General: Bowel sounds are normal.     Palpations: Abdomen is soft.     Tenderness: There is no abdominal tenderness.  Musculoskeletal:        General: Normal range of motion.     Cervical back: Normal range of motion and neck supple.  Lymphadenopathy:     Cervical: No cervical adenopathy.  Skin:    General: Skin is warm and dry.     Comments: Thickened and discolored toenails of the toenails of both great toes.   Neurological:     General: No focal deficit present.     Mental Status: He is alert and oriented to person, place, and time.  Psychiatric:        Mood and Affect: Mood is anxious and depressed.        Speech: Speech normal.        Behavior: Behavior normal.  Thought Content: Thought content normal.        Judgment: Judgment normal.    Assessment/Plan: 1. Type 2 diabetes mellitus with hyperglycemia, without long-term current use of insulin (HCC) - POCT HgB A1C 7.3 today. Patient recently on prednisone taper. No changes in diabetic medication. Refer to podiatry for diabetic foot care.  - Ambulatory referral to Podiatry  2. Chronic obstructive pulmonary disease with acute exacerbation (HCC) Recent chest x-ray, done 08/2019, showing no active cardiopulmonary disease. Start ceftin 53m twice daily for 10 days. Continue to use inhalers and respiratory medications as prescribed and as needed. Follow up with pulmonology as  scheduled.  - cefUROXime (CEFTIN) 500 MG tablet; Take 1 tablet (500 mg total) by mouth 2 (two) times daily with a meal.  Dispense: 20 tablet; Refill: 0  3. Fungal infection of toenail Refer to podiatry for further evaluation.  - Ambulatory referral to Podiatry  4. Atrial fibrillation, unspecified type ([facility] New medication started per cardiology. Follow up as scheduled.   General Counseling: Kinnie verbalizes understanding of the findings of todays visit and agrees with plan of treatment. I have discussed any further diagnostic evaluation that may be needed or ordered today. We also reviewed his medications today. he has been encouraged to call the office with any questions or concerns that should arise related to todays visit.  Diabetes Counseling:  1. Addition of ACE inh/ ARB'S for nephroprotection. Microalbumin is updated  2. Diabetic foot care, prevention of complications. Podiatry consult 3. Exercise and lose weight.  4. Diabetic eye examination, Diabetic eye exam is updated  5. Monitor blood sugar closlely. nutrition counseling.  6. Sign and symptoms of hypoglycemia including shaking sweating,confusion and headaches.  This patient was seen by HLeretha PolFNP Collaboration with Dr FLavera Guiseas a part of collaborative care agreement  Orders Placed This Encounter  Procedures  . Ambulatory referral to Podiatry  . POCT HgB A1C    Meds ordered this encounter  Medications  . cefUROXime (CEFTIN) 500 MG tablet    Sig: Take 1 tablet (500 mg total) by mouth 2 (two) times daily with a meal.    Dispense:  20 tablet    Refill:  0    Order Specific Question:   Supervising Provider    Answer:   KLavera Guise[[[id number]   Total time spent: 30 Minutes  Time spent includes review of chart, medications, test results, and follow up plan with the patient.      Dr FLavera GuiseInternal medicine

## 2019-10-29 ENCOUNTER — Other Ambulatory Visit: Payer: Self-pay

## 2019-10-29 DIAGNOSIS — E1140 Type 2 diabetes mellitus with diabetic neuropathy, unspecified: Secondary | ICD-10-CM | POA: Diagnosis not present

## 2019-10-29 DIAGNOSIS — B351 Tinea unguium: Secondary | ICD-10-CM | POA: Diagnosis not present

## 2019-10-29 MED ORDER — ACCU-CHEK SOFTCLIX LANCETS MISC: 12 refills | Status: DC

## 2019-10-29 MED ORDER — ACCU-CHEK AVIVA PLUS VI STRP: ORAL_STRIP | 12 refills | Status: DC

## 2019-10-29 MED ORDER — ACCU-CHEK AVIVA PLUS W/DEVICE KIT: PACK | 0 refills | Status: DC

## 2019-11-02 ENCOUNTER — Other Ambulatory Visit: Payer: Self-pay

## 2019-11-02 MED ORDER — ACCU-CHEK AVIVA PLUS VI STRP: 1.0000 | ORAL_STRIP | 3 refills | Status: AC | PRN

## 2019-11-02 MED ORDER — ACCU-CHEK AVIVA PLUS W/DEVICE KIT: PACK | 0 refills | Status: AC

## 2019-11-02 MED ORDER — ACCU-CHEK SOFTCLIX LANCETS MISC: 12 refills | Status: AC

## 2019-11-03 ENCOUNTER — Other Ambulatory Visit: Payer: Self-pay

## 2019-11-03 ENCOUNTER — Telehealth (INDEPENDENT_AMBULATORY_CARE_PROVIDER_SITE_OTHER): Payer: Medicare HMO | Admitting: Internal Medicine

## 2019-11-03 VITALS — BP 126/71 | HR 99 | Ht 69.0 in | Wt 270.0 lb

## 2019-11-03 DIAGNOSIS — I4892 Unspecified atrial flutter: Secondary | ICD-10-CM | POA: Diagnosis not present

## 2019-11-03 MED ORDER — MELOXICAM 7.5 MG PO TABS: 7.5000 mg | ORAL_TABLET | Freq: Two times a day (BID) | ORAL | 3 refills | Status: AC

## 2019-11-03 NOTE — Progress Notes (Signed)
telehealth      Electrophysiology TeleHealth Note   Due to national recommendations of social distancing due to [place] 19, an audio/video telehealth visit is felt to be most appropriate for this patient at this time.  See MyChart message from today for the patient's consent to telehealth for [facility].   Date:  11/03/2019   ID:  Lawrence Steele, DOB [birth year], MRN [id number]  Location: patient's home  Provider location: [address], [place]  Evaluation Performed: Follow-up visit  PCP:  Lawrence Freshwater, NP  Cardiologist:   TG Electrophysiologist:  SK   Chief Complaint:  dypsnea  History of Present Illness:    Lawrence Steele is a 69 y.o. male who presents via audio/video conferencing for a telehealth visit today.  Since last being seen in our clinic for atrial arhythmia which after delay of many months finally reviewed with Dr. Elliot Cousin.  Multiple arrhythmias prompted [place] to think in terms of medical therapy.  Elected to start on dronaderone   the patient reports doing a little bit better.  However, in the modest exertion is associated with significant dyspnea and almost always tachypalpitations.  Sinus rates in the past have been in the 70s.  Most daily recorded rates are in the 80s-90s sometimes above 100 and with exertion over 130.  DATE TEST EF    12/15 LHC  51 % CA Nonobstructive  2/18 Myoview 62% Non Ischemic  3/20 Echo  60-65% LA  (42/1.7/24)    Date Cr K Hgb  3/20 0.85 4.2 14.3   1/21 0.99 4.3 13.0    History of cough syncope.   ECG 7/19 Atrial flutter typical 2:1 block >>DCCV   ECG 12/19 Atrial flutter typical 2:1 AV block >> DCCV     The patient denies symptoms of fevers, chills, cough, or new SOB worrisome for COVID 19.    Past Medical History:  Diagnosis Date   AAA (abdominal aortic aneurysm) ([place])    a.  Status post endovascular repair at [facility] in 2016   Anxiety    Asthma    Atrial flutter ([place])    a. diagnosed 7/19; b. CHADS2VASc => 5 (CHF,  HTN, age x 1, DM, vascular disease); c. on Eliquis; d. s/p TEE/DCCV 8/19 with repeat DCCV 12/19   Cancer ([place])    melanoma L index finger    Chronic diastolic CHF (congestive heart failure) (HCC)    COPD (chronic obstructive pulmonary disease) ([place])    Coronary artery disease    a. LHC 5/10: left main 10%, mLAD-1 lesion 20%, mLAD-2 lesion 20%, D1 20%, mLCx-1 lesion 30%, mLCx-2 lesion 50%, OM1 20%, OM to 20%, LPL 20%, pRCA-1 lesion 20%, pRCA-2 lesion 20%.  EF 51% w/ mild inf HK   Diabetes mellitus with complication (HCC)    Hyperlipidemia    Hypertension    Iliac artery aneurysm, bilateral ([place])    Obstructive sleep apnea-hypopnea syndrome     Past Surgical History:  Procedure Laterality Date   ABDOMINAL AORTIC ANEURYSM REPAIR  03/06/2015   [facility]    ADENOIDECTOMY     AMPUTATION Left    partial amputation   APPENDECTOMY     CARDIAC CATHETERIZATION  2010,03/16/2003,11/04/2002   CARDIOVERSION N/A 04/24/2018   Procedure: CARDIOVERSION;  Surgeon: Nelva Bush, MD;  Location: [place];  Service: Cardiovascular;  Laterality: N/A;   CARDIOVERSION N/A 09/05/2018   Procedure: CARDIOVERSION;  Surgeon: Minna Merritts, MD;  Location: [facility];  Service: Cardiovascular;  Laterality: N/A;  CARDIOVERSION N/A 11/24/2018   Procedure: CARDIOVERSION (CATH LAB);  Surgeon: Wellington Hampshire, MD;  Location: [facility];  Service: Cardiovascular;  Laterality: N/A;   CARDIOVERSION N/A 03/18/2019   Procedure: CARDIOVERSION;  Surgeon: Minna Merritts, MD;  Location: [facility];  Service: Cardiovascular;  Laterality: N/A;   CORONARY ANGIOPLASTY  03/16/2003   stent to the RCA  & 2/18/2004stent mid circumflex   ELBOW SURGERY     HERNIA REPAIR     NASAL SINUS SURGERY     PENILE [place]     TEE WITHOUT CARDIOVERSION N/A 04/24/2018   Procedure: TRANSESOPHAGEAL ECHOCARDIOGRAM (TEE);  Surgeon: Nelva Bush, MD;  Location: [facility];  Service: Cardiovascular;   Laterality: N/A;   TONSILECTOMY, ADENOIDECTOMY, BILATERAL MYRINGOTOMY AND TUBES     UMBILICAL HERNIA REPAIR      Current Outpatient Medications  Medication Sig Dispense Refill   Accu-Chek Softclix Lancets lancets Use as instructed  Once a daily E11.65 100 each 12   acetaminophen (TYLENOL) 650 MG CR tablet Take 1,300 mg by mouth every 8 (eight) hours as needed for pain.     albuterol (PROAIR HFA) 108 (90 Base) MCG/ACT inhaler Inhale 2 puffs into the lungs every 6 (six) hours as needed. 3 Inhaler 3   allopurinol (ZYLOPRIM) 100 MG tablet Take 1 tablet (100 mg total) by mouth 2 (two) times daily. 180 tablet 1   apixaban (ELIQUIS) 5 MG TABS tablet Take 1 tablet (5 mg total) by mouth 2 (two) times daily. 180 tablet 3   atenolol (TENORMIN) 25 MG tablet Take 2 tablets (50 mg) in the morning and 1 tablet (25 mg) in the evening, 270 tablet 3   atorvastatin (LIPITOR) 20 MG tablet Take 1 tablet by mouth once daily 90 tablet 0   Blood Glucose Monitoring Suppl (ACCU-CHEK AVIVA PLUS) w/Device KIT Use as directed 1 kit 0   calcium carbonate (TUMS - DOSED IN MG ELEMENTAL CALCIUM) 500 MG chewable tablet Chew 1-2 tablets by mouth 3 (three) times daily as needed for indigestion or heartburn.     cefUROXime (CEFTIN) 500 MG tablet Take 1 tablet (500 mg total) by mouth 2 (two) times daily with a meal. 20 tablet 0   cyclobenzaprine (FLEXERIL) 10 MG tablet Take 1 tablet (10 mg total) by mouth 2 (two) times daily at 10 AM and 5 PM. 60 tablet 2   diclofenac sodium (VOLTAREN) 1 % GEL Apply 1 application topically 4 (four) times daily as needed for pain.     doxycycline (VIBRA-TABS) 100 MG tablet Take 1 tablet (100 mg total) by mouth daily. 30 tablet 3   dronedarone (MULTAQ) 400 MG tablet Take 1 tablet (400 mg total) by mouth 2 (two) times daily with a meal. 36 tablet 0   escitalopram (LEXAPRO) 5 MG tablet Take 1 tablet (5 mg total) by mouth daily. 30 tablet 3   Fluticasone-Salmeterol (ADVAIR) 250-50 MCG/DOSE AEPB Inhale 1  puff into the lungs 2 (two) times daily. 60 each 4   furosemide (LASIX) 20 MG tablet Take 2 tablets (40 mg) by mouth once every other day alternating with 1 tablet (20 mg) by mouth once every other day. (Patient taking differently: Take 20-40 mg by mouth See admin instructions. Take 1 tablet (20 mg) by mouth scheduled daily, if patient experiencing swelling in legs will increase to 2 tablets (40 mg) by mouth in the morning.) 135 tablet 1   gabapentin (NEURONTIN) 400 MG capsule Take 400 mg by  mouth 4 (four) times daily.      glimepiride (AMARYL) 1 MG tablet Take 2 tablets (2 mg total) by mouth daily with supper. 180 tablet 1   glucose blood (ACCU-CHEK AVIVA PLUS) test strip Pt needs accu check Aviva plus lancets, test strips and meter to check blood sugars once daily 100 each 12   glucose blood (ACCU-CHEK AVIVA PLUS) test strip 1 each by Other route as needed for other. Use as instructed 100 each 3   hydroxypropyl methylcellulose / hypromellose (ISOPTO TEARS / GONIOVISC) 2.5 % ophthalmic solution Place 1 drop into both eyes 4 (four) times daily as needed (eye irritation (sand like feeling)).      ipratropium (ATROVENT) 0.02 % nebulizer solution Use every 6 hours for Shortness of breath and wheezing and as needed.  Prescription is for 90 days 900 mL 1   isosorbide mononitrate (IMDUR) 60 MG 24 hr tablet Take 1 tablet (60 mg) by mouth once daily at bedtime 90 tablet 1   loratadine (CLARITIN) 10 MG tablet Take 10 mg by mouth daily.     meloxicam (MOBIC) 7.5 MG tablet Take 1 tablet (7.5 mg total) by mouth 2 (two) times a day. 60 tablet 1   metFORMIN (GLUCOPHAGE) 500 MG tablet Take 1 tablet (500 mg total) by mouth 2 (two) times daily with a meal. 180 tablet 1   metroNIDAZOLE (FLAGYL) 500 MG tablet Take 1 tablet (500 mg total) by mouth 3 (three) times daily. 21 tablet 0   nitroGLYCERIN (NITROSTAT) 0.4 MG SL tablet Place 0.4 mg under the tongue every 5 (five) minutes x 3 doses as needed for chest pain.       nystatin (MYCOSTATIN) 100000 UNIT/ML suspension Take 5 mLs (500,000 Units total) by mouth 4 (four) times daily as needed (mouth/throat irritation.). 200 mL 1   oxyCODONE-acetaminophen (PERCOCET) 10-325 MG tablet Take 1 tablet by mouth 4 (four) times daily.     OXYGEN Inhale 2 L into the lungs continuous.      traZODone (DESYREL) 100 MG tablet Take 1 tablet (100 mg total) by mouth at bedtime. 90 tablet 1   umeclidinium bromide (INCRUSE ELLIPTA) 62.5 MCG/INH AEPB Inhale 1 puff into the lungs daily. 1 each 1   No current facility-administered medications for this visit.    Allergies:   Quinolones   Social History:  The patient  reports that he has been smoking cigarettes. He has a 11.25 pack-year smoking history. He has never used smokeless tobacco. He reports previous alcohol use. He reports that he does not use drugs.   Family History:  The patient's   family history includes Depression in his mother; Heart attack (age of onset: 76) in his father; Heart disease in his father; Hyperlipidemia in his father and mother; Hypertension in his father and mother.   ROS:  Please see the history of present illness.   All other systems are personally reviewed and negative.    Exam:    Vital Signs:  BP 126/71   Pulse 99   Ht _0  (1.753 m)   Wt 270 lb (122.5 kg)   SpO2 92%   BMI 39.87 kg/m     Well appearing, alert and conversant, regular work of breathing,  good skin color Eyes- anicteric, neuro- grossly intact, skin- no apparent rash or lesions or cyanosis, mouth- oral mucosa is pink   Labs/Other Tests and Data Reviewed:    Recent Labs: 11/21/2018: TSH 0.924 10/02/2019: BUN 17; Creatinine, Ser 0.99; Hemoglobin 13.0; Platelets 168;  Potassium 4.3; Sodium 138   Wt Readings from Last 3 Encounters:  11/03/19 270 lb (122.5 kg)  10/23/19 275 lb (124.7 kg)  09/22/19 280 lb 8 oz (127.2 kg)     Other studies personally reviewed:      ASSESSMENT & PLAN:    Atrial flutters multiple  recurrent/persistent   Abdominal and Aortic aneurysmal disease   Morbidly obese  COPD-oxygen dependent   Quinolone therapy previous   Pain-chronic-opiate dependence   Gait instability   Fatigue and somnolence  HFpEF  Hgb  13<<14.3   Significant limitations in exercise tolerance which in his mind correlate predictably with rapid rates.  It is presumptive but likely that the rapid rates reflect increased atrial conduction with exertion in the context of his underlying atrial arrhythmia as opposed to sinus tachycardia.  Hence, we will plan to undertake cardioversion to restore sinus rhythm in hopes that the dronaderone which we will continue will be able to help him maintain sinus rhythm.  In the event that it cannot, alternative antiarrhythmic therapy could be considered.  He does not have coronary disease and so a 1C is a possibility as his amiodarone; however, is oxygen dependent COPD makes this less attractive as does his relatively young age  He is taking his Eliquis.  Has not missed doses.  No bleeding.  Hemoglobin is down slightly-- we will keep aware   COVID 19 screen The patient denies symptoms of COVID 19 at this time.  The importance of social distancing was discussed today.  Follow-up:   4 weeks post cardioversion in office or telehealth    Current medicines are reviewed at length with the patient today.   The patient  concerns regarding his medicines.  The following changes were made today:    Labs/ tests ordered today include: DC cardioversion No orders of the defined types were placed in this encounter.   Future tests ( post COVID )   in   months  Patient Risk:  after full review of this patients clinical status, I feel that they are at moderate  risk at this time.  Today, I have spent 11 minutes with the patient with telehealth technology discussing the above.  Signed, [name], MD  11/03/2019 9:52 AM     [address] [place] [address] [address] [phone] (office) [phone] (fax)

## 2019-11-03 NOTE — Patient Instructions (Addendum)
Medication Instructions:  - Your physician recommends that you continue on your current medications as directed. Please refer to the Current Medication list given to you today.  *If you need a refill on your cardiac medications before your next appointment, please call your pharmacy*  Lab Work: - Pre procedure lab: [facility] entrance, 1st desk on the right to check in once you stop by the screening table  - Pre procedure COVID swab: [facility] entrance, Drive up testing  If you have labs (blood work) drawn today and your tests are completely normal, you will receive your results only by: Mar[facility] Kitchen MyChart Message (if you have MyChart) OR . A paper copy in the mail If you have any lab test that is abnormal or we need to change your treatment, we will call you to review the results.  Testing/Procedures: - Your physician has recommended that you have a Cardioversion (DCCV). Electrical Cardioversion uses a jolt of electricity to your heart either through paddles or wired patches attached to your chest. This is a controlled, usually prescheduled, procedure. Defibrillation is done under light anesthesia in the hospital, and you usually go home the day of the procedure. This is done to get your heart back into a normal rhythm. You are not awake for the procedure. Please see the instruction sheet given to you today.  - You are scheduled for a Cardioversion on ________________ with Dr.___________ Please arrive at the [facility] at _________ a.m. on the day of your procedure.  DIET INSTRUCTIONS:  Nothing to eat or drink after midnight the night before your procedure         1) Labs: __________________  2) Medications:  YOU MAY TAKE ALL of your remaining medications with a small amount of water.  3) Must have a responsible person to drive you home.  4) Bring a current list of your medications and current insurance cards.    If you have any questions after you get home, please call  the office at [phone]. Alvis Lemmings, RN, BSN   Follow-Up: At [facility], you and your health needs are our priority.  As part of our continuing mission to provide you with exceptional heart care, we have created designated Provider Care Teams.  These Care Teams include your primary Cardiologist (physician) and Advanced Practice Providers (APPs -  Physician Assistants and Nurse Practitioners) who all work together to provide you with the care you need, when you need it.  Your next appointment:   4 week(s)  The format for your next appointment:   In Person  Provider:   [name], MD  Other Instructions N/a

## 2019-11-04 ENCOUNTER — Telehealth: Payer: Self-pay | Admitting: Internal Medicine

## 2019-11-04 NOTE — Telephone Encounter (Signed)
Patient calling in for advice on multaq. Patient has been taking samples up til now but unsure if he is supposed to continue them.  Please advise

## 2019-11-04 NOTE — Telephone Encounter (Signed)
I attempted to call the patient. No answer- I left a detailed message on his identified voice mail that I will be calling him back tomorrow to discuss his DCCV that needs to be scheduled and his medication. 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

## 2019-11-05 MED ORDER — DRONEDARONE HCL 400 MG PO TABS: 400.0000 mg | ORAL_TABLET | Freq: Two times a day (BID) | ORAL | 6 refills | Status: DC

## 2019-11-05 NOTE — Telephone Encounter (Signed)
I spoke with the patient.  I advised him that I need to get him set up for his DCCV per Dr. Caryl Comes. He is agreeable to doing this on Friday 11/13/19 at 7:30 am (arrive at 6:30 am) with Dr. Rockey Situ.  I have advised the patient I also reviewed with Dr. Caryl Comes what we need to do about his Multaq as he has just been taking samples of this medication.  Per the patient, he has 3 days of Multaq samples left. I advised him we do not have any more samples in the ofifce. He is aware I am going to send a RX to his pharmacy so they can price this for him. He is aware I will call him back tomorrow with his DCCV instructions.  He is aware if the Multaq is too expensive, then Dr. Caryl Comes wants to start him on flecainide 100 mg BID.  The patient voices understanding of the above recommendations and is agreeable with a call back tomorrow.  I have attempted to call scheduling at [facility]- no answer- I left a message to please place the patient on the schedule for 2/26 at 7:30 am with Dr. Rockey Situ.

## 2019-11-05 NOTE — Telephone Encounter (Signed)
I attempted to call the patient. °No answer- I left a message to please call back.  °

## 2019-11-05 NOTE — Telephone Encounter (Signed)
Patient called back in  Please advise when able

## 2019-11-06 NOTE — Telephone Encounter (Signed)
Attempted to call the patient with his DCCV instructions & to find out if we need to send in flecainide 100 mg BID in place of the multaq if it is too expensive.   No answer- I left a detailed message of the DCCV instructions below and asked the he call back to the office first thing on Monday morning to go over these again/ ask any questions and to confirm if he found out the cost of Multaq.   You are scheduled for a Cardioversion on  Friday 11/13/19  with Dr. Rockey Situ  Please arrive at the [place] [facility] at 6:30 am on the day of your procedure.  DIET INSTRUCTIONS:  Nothing to eat or drink after midnight the night before your procedure        1) Labs:  - Pre-procedure COVID swab: Wednesday 11/11/19 (12:30 pm- 2:30 pm) - [facility] at [facility], drive up only  2) Medications: You may take all of your regular medications the morning of your test with enough water to get them down safely unless listed below:  - Hold lasix (furosemide), amaryl (glimperide), glucophage (metformin) the morning of your procedure   3) Must have a responsible person to drive you home.  4) Bring a current list of your medications and current insurance cards.    If you have any questions after you get home, please call the office at [phone]

## 2019-11-09 ENCOUNTER — Telehealth: Payer: Self-pay | Admitting: Internal Medicine

## 2019-11-09 NOTE — Telephone Encounter (Signed)
Please call to discuss medications for cardioversion.

## 2019-11-09 NOTE — Telephone Encounter (Signed)
Spoke with the patient. Patient made aware of his dccv pre-procedure instructions with verbalized understanding.  Patient sts that he was able to pick up the prescription for Multaq over the weekend.  Patient has no other questions or concerns at this time. Patient voiced appreciation for the call back.  You are scheduled for a Cardioversion on  Friday 11/13/19  with Dr. Rockey Situ Please arrive at the [place] [facility] at 6:30 am on the day of your procedure. DIET INSTRUCTIONS:  Nothing to eat or drink after midnight the night before your procedure        1. Labs:  - Pre-procedure COVID swab: Wednesday 11/11/19 (12:30 pm- 2:30 pm) - [facility] at [facility], drive up only  2. Medications: You may take all of your regular medications the morning of your test with enough water to get them down safely unless listed below:  - Hold lasix (furosemide), amaryl (glimperide), glucophage (metformin) the morning of your procedure   3. Must have a responsible person to drive you home.  4. Bring a current list of your medications and current insurance cards.        If you have any questions after you get home, please call the office at [phone]

## 2019-11-10 ENCOUNTER — Other Ambulatory Visit: Payer: Self-pay | Admitting: Internal Medicine

## 2019-11-10 ENCOUNTER — Telehealth: Payer: Self-pay | Admitting: Nurse Practitioner

## 2019-11-10 NOTE — Telephone Encounter (Signed)
Noted  

## 2019-11-10 NOTE — Telephone Encounter (Signed)
See 2/22 phone note.

## 2019-11-10 NOTE — Telephone Encounter (Signed)
Yes. I agree about visit to ER. He also needs to let cardiology know when he received this vaccine.

## 2019-11-11 ENCOUNTER — Other Ambulatory Visit: Payer: Self-pay

## 2019-11-11 ENCOUNTER — Other Ambulatory Visit
Admission: RE | Admit: 2019-11-11 | Discharge: 2019-11-11 | Disposition: A | Discharge status: Home / Self Care | Payer: Medicare HMO | Source: Ambulatory Visit | Attending: Internal Medicine | Admitting: Internal Medicine

## 2019-11-11 DIAGNOSIS — Z20822 Contact with and (suspected) exposure to covid-19: Secondary | ICD-10-CM | POA: Diagnosis not present

## 2019-11-11 DIAGNOSIS — Z01812 Encounter for preprocedural laboratory examination: Secondary | ICD-10-CM | POA: Insufficient documentation

## 2019-11-12 LAB — SARS CORONAVIRUS 2 (TAT 6-24 HRS): SARS Coronavirus 2: NEGATIVE

## 2019-11-13 ENCOUNTER — Encounter: Payer: Self-pay | Admitting: Cardiovascular Disease

## 2019-11-13 ENCOUNTER — Ambulatory Visit: Payer: Medicare HMO | Admitting: Anesthesiology

## 2019-11-13 ENCOUNTER — Ambulatory Visit
Admission: RE | Admit: 2019-11-13 | Discharge: 2019-11-13 | Disposition: A | Discharge status: Home / Self Care | Payer: Medicare HMO | Attending: Cardiovascular Disease | Admitting: Cardiovascular Disease

## 2019-11-13 ENCOUNTER — Encounter
Admission: RE | Disposition: A | Discharge status: Home / Self Care | Payer: Self-pay | Source: Home / Self Care | Attending: Cardiovascular Disease

## 2019-11-13 ENCOUNTER — Other Ambulatory Visit: Payer: Self-pay

## 2019-11-13 DIAGNOSIS — Z7951 Long term (current) use of inhaled steroids: Secondary | ICD-10-CM | POA: Insufficient documentation

## 2019-11-13 DIAGNOSIS — Z6839 Body mass index (BMI) 39.0-39.9, adult: Secondary | ICD-10-CM | POA: Insufficient documentation

## 2019-11-13 DIAGNOSIS — Z8249 Family history of ischemic heart disease and other diseases of the circulatory system: Secondary | ICD-10-CM | POA: Diagnosis not present

## 2019-11-13 DIAGNOSIS — G4733 Obstructive sleep apnea (adult) (pediatric): Secondary | ICD-10-CM | POA: Insufficient documentation

## 2019-11-13 DIAGNOSIS — G8929 Other chronic pain: Secondary | ICD-10-CM | POA: Diagnosis not present

## 2019-11-13 DIAGNOSIS — I4819 Other persistent atrial fibrillation: Secondary | ICD-10-CM | POA: Diagnosis not present

## 2019-11-13 DIAGNOSIS — I110 Hypertensive heart disease with heart failure: Secondary | ICD-10-CM | POA: Insufficient documentation

## 2019-11-13 DIAGNOSIS — Z793 Long term (current) use of hormonal contraceptives: Secondary | ICD-10-CM | POA: Insufficient documentation

## 2019-11-13 DIAGNOSIS — Z7901 Long term (current) use of anticoagulants: Secondary | ICD-10-CM | POA: Diagnosis not present

## 2019-11-13 DIAGNOSIS — Z791 Long term (current) use of non-steroidal anti-inflammatories (NSAID): Secondary | ICD-10-CM | POA: Diagnosis not present

## 2019-11-13 DIAGNOSIS — E1151 Type 2 diabetes mellitus with diabetic peripheral angiopathy without gangrene: Secondary | ICD-10-CM | POA: Diagnosis not present

## 2019-11-13 DIAGNOSIS — Z9981 Dependence on supplemental oxygen: Secondary | ICD-10-CM | POA: Insufficient documentation

## 2019-11-13 DIAGNOSIS — J449 Chronic obstructive pulmonary disease, unspecified: Secondary | ICD-10-CM | POA: Diagnosis not present

## 2019-11-13 DIAGNOSIS — I5032 Chronic diastolic (congestive) heart failure: Secondary | ICD-10-CM | POA: Insufficient documentation

## 2019-11-13 DIAGNOSIS — R5383 Other fatigue: Secondary | ICD-10-CM | POA: Diagnosis not present

## 2019-11-13 DIAGNOSIS — Z7984 Long term (current) use of oral hypoglycemic drugs: Secondary | ICD-10-CM | POA: Insufficient documentation

## 2019-11-13 DIAGNOSIS — Z79891 Long term (current) use of opiate analgesic: Secondary | ICD-10-CM | POA: Insufficient documentation

## 2019-11-13 DIAGNOSIS — I483 Typical atrial flutter: Secondary | ICD-10-CM | POA: Diagnosis not present

## 2019-11-13 DIAGNOSIS — F17210 Nicotine dependence, cigarettes, uncomplicated: Secondary | ICD-10-CM | POA: Insufficient documentation

## 2019-11-13 DIAGNOSIS — Z01810 Encounter for preprocedural cardiovascular examination: Secondary | ICD-10-CM | POA: Diagnosis not present

## 2019-11-13 DIAGNOSIS — R2689 Other abnormalities of gait and mobility: Secondary | ICD-10-CM | POA: Diagnosis not present

## 2019-11-13 DIAGNOSIS — Z955 Presence of coronary angioplasty implant and graft: Secondary | ICD-10-CM | POA: Insufficient documentation

## 2019-11-13 DIAGNOSIS — J439 Emphysema, unspecified: Secondary | ICD-10-CM | POA: Diagnosis not present

## 2019-11-13 DIAGNOSIS — Z79899 Other long term (current) drug therapy: Secondary | ICD-10-CM | POA: Diagnosis not present

## 2019-11-13 DIAGNOSIS — I2510 Atherosclerotic heart disease of native coronary artery without angina pectoris: Secondary | ICD-10-CM | POA: Diagnosis not present

## 2019-11-13 DIAGNOSIS — I4891 Unspecified atrial fibrillation: Secondary | ICD-10-CM | POA: Diagnosis not present

## 2019-11-13 DIAGNOSIS — F419 Anxiety disorder, unspecified: Secondary | ICD-10-CM | POA: Insufficient documentation

## 2019-11-13 DIAGNOSIS — I4892 Unspecified atrial flutter: Secondary | ICD-10-CM | POA: Insufficient documentation

## 2019-11-13 DIAGNOSIS — E1165 Type 2 diabetes mellitus with hyperglycemia: Secondary | ICD-10-CM | POA: Diagnosis not present

## 2019-11-13 HISTORY — PX: CARDIOVERSION: SHX1299

## 2019-11-13 SURGERY — CARDIOVERSION
Anesthesia: Moderate Sedation

## 2019-11-13 SURGERY — CARDIOVERSION
Anesthesia: General

## 2019-11-13 MED ORDER — PROPOFOL 10 MG/ML IV BOLUS
INTRAVENOUS | Status: DC | PRN
  Administered 2019-11-13: 20 mg via INTRAVENOUS
  Administered 2019-11-13: 60 mg via INTRAVENOUS
  Administered 2019-11-13: 20 mg via INTRAVENOUS

## 2019-11-13 MED ORDER — SODIUM CHLORIDE 0.9 % IV SOLN: INTRAVENOUS | Status: DC | PRN

## 2019-11-13 NOTE — CV Procedure (Signed)
Cardioversion procedure note For atrial fib/flutter, persistent.  Procedure Details:  Consent: Risks of procedure as well as the alternatives and risks of each were explained to the (patient/caregiver). Consent for procedure obtained.  Time Out: Verified patient identification, verified procedure, site/side was marked, verified correct patient position, special equipment/implants available, medications/allergies/relevent history reviewed, required imaging and test results available. Performed  Patient placed on cardiac monitor, pulse oximetry, supplemental oxygen as necessary.  Sedation given: propofol IV, Dr. Amie Critchley Pacer pads placed anterior and posterior chest.   Cardioverted 3 time(s).  Cardioverted at150  J, 200J with manual compression, 200J with manual compression,  Synchronized biphasic Converted to NSR   Evaluation: Findings: Post procedure EKG shows: NSR Complications: None Patient did tolerate procedure well.  Time Spent Directly with the Patient:  61 minutes   Esmond Plants, M.D., Ph.D.

## 2019-11-13 NOTE — Anesthesia Preprocedure Evaluation (Signed)
Anesthesia Evaluation  Patient identified by MRN, date of birth, ID band Patient awake    Reviewed: Allergy & Precautions, H&P , NPO status , Patient's Chart, lab work & pertinent test results  History of Anesthesia Complications Negative for: history of anesthetic complications  Airway Mallampati: III  TM Distance: <3 FB Neck ROM: limited    Dental  (+) Chipped, Poor Dentition, Missing   Pulmonary asthma , sleep apnea, Continuous Positive Airway Pressure Ventilation and Oxygen sleep apnea , COPD,  COPD inhaler and oxygen dependent, Current Smoker,           Cardiovascular Exercise Tolerance: Good hypertension, (-) angina+ CAD, + Peripheral Vascular Disease, +CHF and + DOE  (-) Past MI + dysrhythmias Atrial Fibrillation      Neuro/Psych  Headaches, PSYCHIATRIC DISORDERS  Neuromuscular disease negative neurological ROS     GI/Hepatic negative GI ROS, Neg liver ROS, neg GERD  ,  Endo/Other  negative endocrine ROSdiabetes  Renal/GU negative Renal ROS  negative genitourinary   Musculoskeletal  (+) Arthritis ,   Abdominal   Peds  Hematology negative hematology ROS (+)   Anesthesia Other Findings Past Medical History: No date: AAA (abdominal aortic aneurysm) (HCC)     Comment:  a.  Status post endovascular repair at [facility] in 2016 No date: Anxiety No date: Asthma No date: Atrial flutter (HCC)     Comment:  a. diagnosed 7/19; b. CHADS2VASc => 5 (CHF, HTN, age x               1, DM, vascular disease); c. on Eliquis; d. s/p TEE/DCCV               8/19 with repeat DCCV 12/19 No date: Chronic diastolic CHF (congestive heart failure) (HCC) No date: COPD (chronic obstructive pulmonary disease) ([place]) No date: Coronary artery disease     Comment:  a. LHC 5/10: left main 10%, mLAD-1 lesion 20%, mLAD-2               lesion 20%, D1 20%, mLCx-1 lesion 30%, mLCx-2 lesion 50%,              OM1 20%, OM to 20%, LPL 20%, pRCA-1 lesion  20%, pRCA-2               lesion 20%.  EF 51% w/ mild inf HK No date: Diabetes mellitus with complication (HCC) No date: Hyperlipidemia No date: Hypertension No date: Iliac artery aneurysm, bilateral (HCC) No date: Obstructive sleep apnea-hypopnea syndrome  Past Surgical History: 03/06/2015: ABDOMINAL AORTIC ANEURYSM REPAIR     Comment:  [facility]  No date: ADENOIDECTOMY No date: APPENDECTOMY 2010,03/16/2003,11/04/2002: CARDIAC CATHETERIZATION 04/24/2018: CARDIOVERSION; N/A     Comment:  Procedure: CARDIOVERSION;  Surgeon: Nelva Bush,               MD;  Location: [facility];  Service: Cardiovascular;                Laterality: N/A; 09/05/2018: CARDIOVERSION; N/A     Comment:  Procedure: CARDIOVERSION;  Surgeon: Minna Merritts,               MD;  Location: [facility];  Service: Cardiovascular;                Laterality: N/A; 11/24/2018: CARDIOVERSION; N/A     Comment:  Procedure: CARDIOVERSION (CATH LAB);  Surgeon: Wellington Hampshire, MD;  Location:  [facility];  Service:               Cardiovascular;  Laterality: N/A; 03/16/2003: CORONARY ANGIOPLASTY     Comment:  stent to the RCA  & 2/18/2004stent mid circumflex No date: ELBOW SURGERY No date: HERNIA REPAIR No date: NASAL SINUS SURGERY 1995: PENILE PROSTHESIS IMPLANT No date: PILONIDAL CYST EXCISION 04/24/2018: TEE WITHOUT CARDIOVERSION; N/A     Comment:  Procedure: TRANSESOPHAGEAL ECHOCARDIOGRAM (TEE);                Surgeon: Nelva Bush, MD;  Location: [facility];                Service: Cardiovascular;  Laterality: N/A; No date: TONSILECTOMY, ADENOIDECTOMY, BILATERAL MYRINGOTOMY AND TUBES No date: UMBILICAL HERNIA REPAIR  BMI    Body Mass Index: 39.43 kg/m      Reproductive/Obstetrics negative OB ROS                             Anesthesia Physical  Anesthesia Plan  ASA: IV  Anesthesia Plan: General   Post-op Pain Management:    Induction: Intravenous  PONV Risk Score  and Plan: Propofol infusion and TIVA  Airway Management Planned: Natural Airway and Nasal Cannula  Additional Equipment:   Intra-op Plan:   Post-operative Plan:   Informed Consent: I have reviewed the patients History and Physical, chart, labs and discussed the procedure including the risks, benefits and alternatives for the proposed anesthesia with the patient or authorized representative who has indicated his/her understanding and acceptance.     Dental Advisory Given  Plan Discussed with: Anesthesiologist, CRNA and Surgeon  Anesthesia Plan Comments: (Patient consented for risks of anesthesia including but not limited to:  - adverse reactions to medications - risk of intubation if required - damage to teeth, lips or other oral mucosa - sore throat or hoarseness - Damage to heart, brain, lungs or loss of life  Patient voiced understanding.)        Anesthesia Quick Evaluation

## 2019-11-13 NOTE — Anesthesia Postprocedure Evaluation (Signed)
Anesthesia Post Note  Patient: Lawrence Steele  Procedure(s) Performed: CARDIOVERSION (N/A )  Patient location during evaluation: Spec[facility] Recovery Anesthesia Type: General Level of consciousness: awake and alert Pain management: pain level controlled Vital Signs Assessment: post-procedure vital signs reviewed and stable Respiratory status: spontaneous breathing, nonlabored ventilation, respiratory function stable and patient connected to nasal cannula oxygen Cardiovascular status: blood pressure returned to baseline and stable Postop Assessment: no apparent nausea or vomiting Anesthetic complications: no     Last Vitals:  Vitals:   11/13/19 0800 11/13/19 0815  BP: 111/86 94/68  Pulse: 67 68  Resp: 14 14  Temp:    SpO2: 95% 95%    Last Pain:  Vitals:   11/13/19 0815  TempSrc:   PainSc: 0-No pain                 [name]

## 2019-11-13 NOTE — Transfer of Care (Signed)
Immediate Anesthesia Transfer of Care Note  Patient: Lawrence Steele  Procedure(s) Performed: CARDIOVERSION (N/A )  Patient Location: PACU  Anesthesia Type:General  Level of Consciousness: sedated  Airway & Oxygen Therapy: Patient Spontanous Breathing and Patient connected to nasal cannula oxygen  Post-op Assessment: Report given to RN and Post -op Vital signs reviewed and stable  Post vital signs: Reviewed and stable  Last Vitals:  Vitals Value Taken Time  BP 95/60 11/13/19 0740  Temp    Pulse 65 11/13/19 0742  Resp 12 11/13/19 0742  SpO2 98 % 11/13/19 0742    Last Pain:  Vitals:   11/13/19 0715  TempSrc: Oral  PainSc: 0-No pain         Complications: No apparent anesthesia complications

## 2019-11-15 NOTE — H&P (Signed)
H&P Addendum, pre-cardioversion  Patient was seen and evaluated prior to -cardioversion procedure Symptoms, prior testing details again confirmed with the patient Patient examined, no significant change from prior exam Lab work reviewed in detail personally by myself Patient understands risk and benefit of the procedure, willing to proceed  Signed, Tim Shailah Gibbins, MD, Ph.D [facility]  

## 2019-11-29 ENCOUNTER — Other Ambulatory Visit: Payer: Self-pay | Admitting: Cardiovascular Disease

## 2019-11-30 ENCOUNTER — Other Ambulatory Visit: Payer: Self-pay

## 2019-11-30 DIAGNOSIS — E1165 Type 2 diabetes mellitus with hyperglycemia: Secondary | ICD-10-CM

## 2019-11-30 MED ORDER — ALLOPURINOL 100 MG PO TABS: 100.0000 mg | ORAL_TABLET | Freq: Two times a day (BID) | ORAL | 1 refills | Status: DC

## 2019-11-30 MED ORDER — GLIMEPIRIDE 1 MG PO TABS: 2.0000 mg | ORAL_TABLET | Freq: Every day | ORAL | 1 refills | Status: DC

## 2019-11-30 MED ORDER — DOXYCYCLINE HYCLATE 100 MG PO TABS: 100.0000 mg | ORAL_TABLET | Freq: Every day | ORAL | 3 refills | Status: DC

## 2019-12-01 ENCOUNTER — Telehealth: Payer: Self-pay

## 2019-12-01 NOTE — Telephone Encounter (Signed)
Confirmed and screened pt for appt 12/03/19

## 2019-12-01 NOTE — Progress Notes (Signed)
Office Visit    Patient Name: Lawrence Steele Date of Encounter: 12/01/2019  Primary Care Provider:  Ronnell Freshwater, NP Primary Cardiologist:  Ida Rogue, MD Electrophysiologist:  None   Chief Complaint    Lawrence Steele is a 69 y.o. male with a hx of atrial flutter, chronic anticoagulation on apixaban, oxygen dependent COPD, OSA on CPAP, CAD presents today for follow up after cardioversion.   Past Medical History    Past Medical History:  Diagnosis Date  . AAA (abdominal aortic aneurysm) ([place])    a.  Status post endovascular repair at [facility] in 2016  . Anxiety   . Asthma   . Atrial flutter ([place])    a. diagnosed 7/19; b. CHADS2VASc => 5 (CHF, HTN, age x 1, DM, vascular disease); c. on Eliquis; d. s/p TEE/DCCV 8/19 with repeat DCCV 12/19  . Cancer (HCC)    melanoma L index finger   . Chronic diastolic CHF (congestive heart failure) ([place])   . COPD (chronic obstructive pulmonary disease) ([place])   . Coronary artery disease    a. LHC 5/10: left main 10%, mLAD-1 lesion 20%, mLAD-2 lesion 20%, D1 20%, mLCx-1 lesion 30%, mLCx-2 lesion 50%, OM1 20%, OM to 20%, LPL 20%, pRCA-1 lesion 20%, pRCA-2 lesion 20%.  EF 51% w/ mild inf HK  . Diabetes mellitus with complication ([place])   . Hyperlipidemia   . Hypertension   . Iliac artery aneurysm, bilateral ([place])   . Obstructive sleep apnea-hypopnea syndrome    Past Surgical History:  Procedure Laterality Date  . ABDOMINAL AORTIC ANEURYSM REPAIR  03/06/2015   [place]    . AMPUTATION Left    partial amputation  . APPENDECTOMY    . CARDIAC CATHETERIZATION  2010,03/16/2003,11/04/2002  . CARDIOVERSION N/A 04/24/2018   Procedure: CARDIOVERSION;  Surgeon: Nelva Bush, MD;  Location: [facility];  Service: Cardiovascular;  Laterality: N/A;  . CARDIOVERSION N/A 09/05/2018   Procedure: CARDIOVERSION;  Surgeon: Minna Merritts, MD;  Location: [facility];  Service: Cardiovascular;  Laterality: N/A;  . CARDIOVERSION N/A  11/24/2018   Procedure: CARDIOVERSION (CATH LAB);  Surgeon: Wellington Hampshire, MD;  Location: [facility];  Service: Cardiovascular;  Laterality: N/A;  . CARDIOVERSION N/A 03/18/2019   Procedure: CARDIOVERSION;  Surgeon: Minna Merritts, MD;  Location: [facility];  Service: Cardiovascular;  Laterality: N/A;  . CARDIOVERSION N/A 11/13/2019   Procedure: CARDIOVERSION;  Surgeon: Minna Merritts, MD;  Location: [facility];  Service: Cardiovascular;  Laterality: N/A;  . CORONARY ANGIOPLASTY  03/16/2003   stent to the RCA  & 2/18/2004stent mid circumflex  . ELBOW SURGERY    . HERNIA REPAIR    . NASAL SINUS SURGERY    . PENILE PROSTHESIS IMPLANT  1995  . PILONIDAL CYST EXCISION    . TEE WITHOUT CARDIOVERSION N/A 04/24/2018   Procedure: TRANSESOPHAGEAL ECHOCARDIOGRAM (TEE);  Surgeon: Nelva Bush, MD;  Location: [facility];  Service: Cardiovascular;  Laterality: N/A;  . TONSILECTOMY, ADENOIDECTOMY, BILATERAL MYRINGOTOMY AND TUBES    . UMBILICAL HERNIA REPAIR      Allergies  No Active Allergies  History of Present Illness    Lawrence Steele is a 69 y.o. male with a hx of atrial flutter, chronic anticoagulation on apixaban, oxygen dependent COPD, OSA on CPAP, CAD. He was last seen for cardioversion 11/13/19.  PCI to RCA and LCx 2004. LHC 12//2015 with EF 51% and nonobstructive CAD. Lexiscan 10/2016 EF 62% with frequent PAC, low risk study.  Diagnosed with atrial flutter with RVR 03/2018, started on Eliquis, underwent successful TEE guided DCCV with EF 555-60%. CT abd 09/2015 with stable aortoiliac endograft with decreased aneurysm size and table bilateral common iliac artery aneurysms.  Admitted 09/03/18 atrial flutter with RVR in setting of URI requirin cardioversion. After cardioversion, noted weight gain and started on lasix. Echo 10/09/18 EF 60-65%, mildly dilated LV, gr2DD, mildly dilated LA. Recurrent atrial flutter and cardioversion 11/24/18, was referred to EP.   Called office 08/2019 with recurrent  elevated heart rates. Subsequent workup revealed recurrent atrial flutter. Atenolol dose increased, but BP subsequently dropped. Dr. Caryl Comes discussed case with Dr. Lovena Le & Dr. Rayann Heman who felt he would be a difficult candidate for ablation with his multiple flutters. He was started on Multaq. During televisit 11/03/19 he was scheduled for cardioversion. Underwent DCCV 11/13/19 with restoration of NSR.   Reports no chest pain, pressure, tightness.  Reports feeling much better after his cardioversion.  Telemetry his heart rates have been very well controlled with heart rate 60s to 70s at home.  Endorses compliance with all of his cardiac medications.  Reports no shortness of breath at rest.  Reports his dyspnea on exertion is stable at his baseline.  He does utilize his oxygen at home.  He has upcoming overnight pulse oximetry with pulmonology to see if he can wear his oxygen overnight in addition to his CPAP.  No lower extremity edema.  No orthopnea, PND.  EKG today shows he is maintaining sinus rhythm. EKGs/Labs/Other Studies Reviewed:   The following studies were reviewed today: Echo 10/09/18 - Left ventricle: The cavity size was mildly dilated. Systolic   function was normal. The estimated ejection fraction was in the   range of 60% to 65%. Wall motion was normal; there were no   regional wall motion abnormalities. Features are consistent with   a pseudonormal left ventricular filling pattern, with concomitant   abnormal relaxation and increased filling pressure (grade 2   diastolic dysfunction). - Left atrium: The atrium was mildly dilated. - Right ventricle: Systolic function was normal. - Pulmonary arteries: Systolic pressure was within the normal   range.   Impressions:   - Challenging image quality. Rhythym appears to be NSR.  EKG:  EKG is ordered today.  The ekg ordered today demonstrates NSR 74 bpm - EKG reviewed by Dr. Garen Lah and myself.  Recent Labs: 10/02/2019: BUN 17;  Creatinine, Ser 0.99; Hemoglobin 13.0; Platelets 168; Potassium 4.3; Sodium 138  Recent Lipid Panel    Component Value Date/Time   CHOL 199 10/25/2016 0753   TRIG 114 10/25/2016 0753   HDL 56 10/25/2016 0753   CHOLHDL 3.6 10/25/2016 0753   VLDL 23 10/25/2016 0753   LDLCALC 120 (H) 10/25/2016 0753    Home Medications   No outpatient medications have been marked as taking for the 12/04/19 encounter (Appointment) with Loel Dubonnet, NP.      Review of Systems       Review of Systems  Constitution: Negative for chills, fever and malaise/fatigue.  Cardiovascular: Positive for dyspnea on exertion. Negative for chest pain, irregular heartbeat, leg swelling, near-syncope, orthopnea, palpitations and syncope.  Respiratory: Negative for cough, shortness of breath and wheezing.   Gastrointestinal: Negative for melena, nausea and vomiting.  Genitourinary: Negative for hematuria.  Neurological: Negative for dizziness, light-headedness and weakness.   All other systems reviewed and are otherwise negative except as noted above.  Physical Exam    VS:  There were no vitals taken  for this visit. , BMI There is no height or weight on file to calculate BMI. GEN: Well nourished, well developed, in no acute distress. HEENT: normal. Neck: Supple, no JVD, carotid bruits, or masses. Cardiac: RRR, no murmurs, rubs, or gallops. No clubbing, cyanosis, edema.  Radials/DP/PT 2+ and equal bilaterally.  Respiratory:  Respirations regular and unlabored, clear to auscultation bilaterally. Diminished in bases bilaterally. On 2L O2. GI: Soft, nontender, nondistended, BS + x 4. MS: No deformity or atrophy. Skin: Warm and dry, no rash. Neuro:  Strength and sensation are intact. Psych: Normal affect.  Accessory Clinical Findings    ECG personally reviewed by me today - SR 7 4bpm - no acute changes.  Assessment & Plan    1. Atrial flutter - Maintaining NSR after DCCV on 11/13/19. Deemed poor candidate  for ablation by EP previously. Continue rhythm control with Multaq 400mg  BID, Atenolol 50mg  AM and 25mg  PM. Continue anticoagulation with Eliquis 5mg  BID. Denies bleeding complications.  2. Chronic anticoagulation - Secondary to atrial flutter and CHADS2VASC of at least 5 (HFpEF, HTN, age, DM2, AAA/CAD). Denies bleeding complications.  3. CAD without angina - Stable with no anginal symptoms. No indication for ischemic eval at this time. Continue beta blocker, statin, Imdur - no aspirin secondary to anticoagulation.  4. Chronic HfpEF - Euvolemic and well compensated on exam today. Continue present Lasix dosing of alternating 40mg  and 20mg . Continue beta blocker.  5. OSA on CPAP - CPAP compliance encouraged. Upcoming overnight oximetry with pulmonology. 6. HTN - BP well controlled. Denies lightheadedness, dizziness. Continue present antihypertensive regimen. 7. AAA - Continue BP control. Avoid quilonolones.  8. Tobacco abuse - Smoking cessation encouraged. Recommend utilization of [id number]. 9. COPD on oxygen - Follows with pulmonology. m 10. HLD - Hx of statin intolerance but tolerating atorvastatin. Plan for fasting lipid panel and liver panel at next office visit.  11. Morbid obesity - Weight loss via diet and exercise encouraged.  Disposition: Follow up in 2 month(s) with Dr. Rockey Situ or APP   Loel Dubonnet, NP 12/01/2019, 1:57 PM

## 2019-12-02 ENCOUNTER — Other Ambulatory Visit: Payer: Self-pay

## 2019-12-02 DIAGNOSIS — E1165 Type 2 diabetes mellitus with hyperglycemia: Secondary | ICD-10-CM

## 2019-12-02 MED ORDER — GLIMEPIRIDE 1 MG PO TABS: 2.0000 mg | ORAL_TABLET | Freq: Every day | ORAL | 1 refills | Status: DC

## 2019-12-03 ENCOUNTER — Encounter: Payer: Self-pay | Admitting: Internal Medicine

## 2019-12-03 ENCOUNTER — Ambulatory Visit: Payer: Medicare HMO | Admitting: Internal Medicine

## 2019-12-03 ENCOUNTER — Other Ambulatory Visit: Payer: Self-pay

## 2019-12-03 VITALS — BP 120/68 | HR 71 | Temp 97.3°F | Resp 16 | Ht 70.0 in | Wt 275.0 lb

## 2019-12-03 DIAGNOSIS — G4733 Obstructive sleep apnea (adult) (pediatric): Secondary | ICD-10-CM

## 2019-12-03 DIAGNOSIS — F17200 Nicotine dependence, unspecified, uncomplicated: Secondary | ICD-10-CM | POA: Diagnosis not present

## 2019-12-03 DIAGNOSIS — Z6839 Body mass index (BMI) 39.0-39.9, adult: Secondary | ICD-10-CM

## 2019-12-03 DIAGNOSIS — Z9989 Dependence on other enabling machines and devices: Secondary | ICD-10-CM | POA: Diagnosis not present

## 2019-12-03 DIAGNOSIS — I4891 Unspecified atrial fibrillation: Secondary | ICD-10-CM | POA: Diagnosis not present

## 2019-12-03 DIAGNOSIS — R0602 Shortness of breath: Secondary | ICD-10-CM | POA: Diagnosis not present

## 2019-12-03 DIAGNOSIS — J9611 Chronic respiratory failure with hypoxia: Secondary | ICD-10-CM | POA: Diagnosis not present

## 2019-12-03 DIAGNOSIS — J441 Chronic obstructive pulmonary disease with (acute) exacerbation: Secondary | ICD-10-CM | POA: Diagnosis not present

## 2019-12-03 NOTE — Progress Notes (Signed)
[facility] [place], [place] [address]  Pulmonary Sleep Medicine   Office Visit Note  Patient Name: Lawrence Steele DOB: 1951/04/27 MRN [id number]  Date of Service: 12/03/2019  Complaints/HPI: Pt is here for pulmonary follow up. He has a history of osa, copd, chronic respiratory failure and afib.  He reports overall he is doing well.   ROS  General: (-) fever, (-) chills, (-) night sweats, (-) weakness Skin: (-) rashes, (-) itching,. Eyes: (-) visual changes, (-) redness, (-) itching. Nose and Sinuses: (-) nasal stuffiness or itchiness, (-) postnasal drip, (-) nosebleeds, (-) sinus trouble. Mouth and Throat: (-) sore throat, (-) hoarseness. Neck: (-) swollen glands, (-) enlarged thyroid, (-) neck pain. Respiratory: - cough, (-) bloody sputum, +shortness of breath, - wheezing. Cardiovascular: - ankle swelling, (-) chest pain. Lymphatic: (-) lymph node enlargement. Neurologic: (-) numbness, (-) tingling. Psychiatric: (-) anxiety, (-) depression   Current Medication: Outpatient Encounter Medications as of 12/03/2019  Medication Sig  . Accu-Chek Softclix Lancets lancets Use as instructed  Once a daily E11.65  . acetaminophen (TYLENOL) 500 MG tablet Take 1,000 mg by mouth every 6 (six) hours as needed for headache.  . albuterol (PROAIR HFA) 108 (90 Base) MCG/ACT inhaler Inhale 2 puffs into the lungs every 6 (six) hours as needed. (Patient taking differently: Inhale 2 puffs into the lungs every 6 (six) hours as needed for wheezing. Ventolin)  . allopurinol (ZYLOPRIM) 100 MG tablet Take 1 tablet (100 mg total) by mouth 2 (two) times daily.  Marland Kitchen apixaban (ELIQUIS) 5 MG TABS tablet Take 1 tablet (5 mg total) by mouth 2 (two) times daily.  Marland Kitchen atenolol (TENORMIN) 25 MG tablet Take 2 tablets (50 mg) in the morning and 1 tablet (25 mg) in the evening, (Patient taking differently: Take 25-50 mg by mouth 2 (two) times daily. Take 2 tablets (50 mg) in the morning and 1  tablet (25 mg) in the evening,)  . atorvastatin (LIPITOR) 20 MG tablet Take 1 tablet by mouth once daily  . Blood Glucose Monitoring Suppl (ACCU-CHEK AVIVA PLUS) w/Device KIT Use as directed  . calcium carbonate (TUMS - DOSED IN MG ELEMENTAL CALCIUM) 500 MG chewable tablet Chew 1-2 tablets by mouth 3 (three) times daily as needed for indigestion or heartburn.  . cefUROXime (CEFTIN) 500 MG tablet Take 1 tablet (500 mg total) by mouth 2 (two) times daily with a meal.  . cyclobenzaprine (FLEXERIL) 10 MG tablet Take 1 tablet (10 mg total) by mouth 2 (two) times daily at 10 AM and 5 PM.  . diclofenac sodium (VOLTAREN) 1 % GEL Apply 1 application topically 4 (four) times daily as needed (pain).   Marland Kitchen doxycycline (VIBRA-TABS) 100 MG tablet Take 1 tablet (100 mg total) by mouth daily.  Marland Kitchen dronedarone (MULTAQ) 400 MG tablet Take 1 tablet (400 mg total) by mouth 2 (two) times daily with a meal.  . escitalopram (LEXAPRO) 5 MG tablet Take 1 tablet (5 mg total) by mouth daily.  . Fluticasone-Salmeterol (ADVAIR) 250-50 MCG/DOSE AEPB Inhale 1 puff into the lungs 2 (two) times daily.  . furosemide (LASIX) 20 MG tablet Take 2 tablets (40 mg) by mouth once every other day alternating with 1 tablet (20 mg) by mouth once every other day. (Patient taking differently: Take 20-40 mg by mouth See admin instructions. Take 1 tablet (20 mg) by mouth scheduled daily, if patient experiencing swelling in legs will increase to 2 tablets (40 mg) by mouth in the morning.)  .  gabapentin (NEURONTIN) 400 MG capsule Take 400 mg by mouth 4 (four) times daily.   Marland Kitchen glimepiride (AMARYL) 1 MG tablet Take 2 tablets (2 mg total) by mouth daily with supper.  Marland Kitchen glucose blood (ACCU-CHEK AVIVA PLUS) test strip Pt needs accu check Aviva plus lancets, test strips and meter to check blood sugars once daily  . glucose blood (ACCU-CHEK AVIVA PLUS) test strip 1 each by Other route as needed for other. Use as instructed  . hydroxypropyl methylcellulose /  hypromellose (ISOPTO TEARS / GONIOVISC) 2.5 % ophthalmic solution Place 1 drop into both eyes 4 (four) times daily as needed (eye irritation (sand like feeling)).   Marland Kitchen ipratropium (ATROVENT) 0.02 % nebulizer solution Use every 6 hours for Shortness of breath and wheezing and as needed.  Prescription is for 90 days (Patient taking differently: Take 0.5 mg by nebulization every 6 (six) hours as needed for wheezing or shortness of breath. Use every 6 hours for Shortness of breath and wheezing and as needed.  Prescription is for 90 days)  . isosorbide mononitrate (IMDUR) 60 MG 24 hr tablet Take 1 tablet (60 mg) by mouth once daily at bedtime (Patient taking differently: Take 60 mg by mouth daily. Take 1 tablet (60 mg) by mouth once daily at bedtime)  . loratadine (CLARITIN) 10 MG tablet Take 10 mg by mouth daily.  . meloxicam (MOBIC) 7.5 MG tablet Take 1 tablet (7.5 mg total) by mouth 2 (two) times daily.  . metFORMIN (GLUCOPHAGE) 500 MG tablet Take 1 tablet (500 mg total) by mouth 2 (two) times daily with a meal.  . metroNIDAZOLE (FLAGYL) 500 MG tablet Take 1 tablet (500 mg total) by mouth 3 (three) times daily.  . nitroGLYCERIN (NITROSTAT) 0.4 MG SL tablet Place 0.4 mg under the tongue every 5 (five) minutes x 3 doses as needed for chest pain.   Marland Kitchen nystatin (MYCOSTATIN) 100000 UNIT/ML suspension Take 5 mLs (500,000 Units total) by mouth 4 (four) times daily as needed (mouth/throat irritation.).  Marland Kitchen oxyCODONE-acetaminophen (PERCOCET) 10-325 MG tablet Take 1 tablet by mouth 4 (four) times daily.  . OXYGEN Inhale 2 L into the lungs continuous.   Marland Kitchen tiotropium (SPIRIVA) 18 MCG inhalation capsule Place 18 mcg into inhaler and inhale daily.  . traZODone (DESYREL) 100 MG tablet Take 1 tablet (100 mg total) by mouth at bedtime.  Marland Kitchen umeclidinium bromide (INCRUSE ELLIPTA) 62.5 MCG/INH AEPB Inhale 1 puff into the lungs daily.   No facility-administered encounter medications on file as of 12/03/2019.    Surgical  History: Past Surgical History:  Procedure Laterality Date  . ABDOMINAL AORTIC ANEURYSM REPAIR  03/06/2015   [place]    . AMPUTATION Left    partial amputation  . APPENDECTOMY    . CARDIAC CATHETERIZATION  2010,03/16/2003,11/04/2002  . CARDIOVERSION N/A 04/24/2018   Procedure: CARDIOVERSION;  Surgeon: Nelva Bush, MD;  Location: [facility];  Service: Cardiovascular;  Laterality: N/A;  . CARDIOVERSION N/A 09/05/2018   Procedure: CARDIOVERSION;  Surgeon: Minna Merritts, MD;  Location: [facility];  Service: Cardiovascular;  Laterality: N/A;  . CARDIOVERSION N/A 11/24/2018   Procedure: CARDIOVERSION (CATH LAB);  Surgeon: Wellington Hampshire, MD;  Location: [facility];  Service: Cardiovascular;  Laterality: N/A;  . CARDIOVERSION N/A 03/18/2019   Procedure: CARDIOVERSION;  Surgeon: Minna Merritts, MD;  Location: [facility];  Service: Cardiovascular;  Laterality: N/A;  . CARDIOVERSION N/A 11/13/2019   Procedure: CARDIOVERSION;  Surgeon: Minna Merritts, MD;  Location: [facility]  [facility];  Service: Cardiovascular;  Laterality: N/A;  . CORONARY ANGIOPLASTY  03/16/2003   stent to the RCA  & 2/18/2004stent mid circumflex  . ELBOW SURGERY    . HERNIA REPAIR    . NASAL SINUS SURGERY    . PENILE PROSTHESIS IMPLANT  1995  . PILONIDAL CYST EXCISION    . TEE WITHOUT CARDIOVERSION N/A 04/24/2018   Procedure: TRANSESOPHAGEAL ECHOCARDIOGRAM (TEE);  Surgeon: Nelva Bush, MD;  Location: [facility] OR[facility];  Service: Cardiovascular;  Laterality: N/A;  . TONSILECTOMY, ADENOIDECTOMY, BILATERAL MYRINGOTOMY AND TUBES    . UMBILICAL HERNIA REPAIR      Medical History: Past Medical History:  Diagnosis Date  . AAA (abdominal aortic aneurysm) ([place])    a.  Status post endovascular repair at [facility] in 2016  . Anxiety   . Asthma   . Atrial flutter ([place])    a. diagnosed 7/19; b. CHADS2VASc => 5 (CHF, HTN, age x 1, DM, vascular disease); c. on Eliquis; d. s/p TEE/DCCV 8/19 with repeat DCCV 12/19  . Cancer (HCC)     melanoma L index finger   . Chronic diastolic CHF (congestive heart failure) ([place])   . COPD (chronic obstructive pulmonary disease) ([place])   . Coronary artery disease    a. LHC 5/10: left main 10%, mLAD-1 lesion 20%, mLAD-2 lesion 20%, D1 20%, mLCx-1 lesion 30%, mLCx-2 lesion 50%, OM1 20%, OM to 20%, LPL 20%, pRCA-1 lesion 20%, pRCA-2 lesion 20%.  EF 51% w/ mild inf HK  . Diabetes mellitus with complication ([place])   . Hyperlipidemia   . Hypertension   . Iliac artery aneurysm, bilateral ([place])   . Obstructive sleep apnea-hypopnea syndrome     Family History: Family History  Problem Relation Age of Onset  . Hypertension Mother   . Hyperlipidemia Mother   . Depression Mother   . Heart attack Father 72  . Heart disease Father   . Hypertension Father   . Hyperlipidemia Father     Social History: Social History   Socioeconomic History  . Marital status: Divorced    Spouse name: Not on file  . Number of children: 2  . Years of education: 10   . Highest education level: 10th grade  Occupational History  . Occupation: disabled   Tobacco Use  . Smoking status: Current Every Day Smoker    Packs/day: 0.25    Years: 45.00    Pack years: 11.25    Types: Cigarettes  . Smokeless tobacco: Never Used  . Tobacco comment: 3 to 4 cigarettes a day  Substance and Sexual Activity  . Alcohol use: Not Currently  . Drug use: No  . Sexual activity: Not on file  Other Topics Concern  . Not on file  Social History Narrative  . Not on file   Social Determinants of Health   Financial Resource Strain:   . Difficulty of Paying Living Expenses:   Food Insecurity:   . Worried About Charity fundraiser in the Last Year:   . Arboriculturist in the Last Year:   Transportation Needs: No Transportation Needs  . Lack of Transportation (Medical): No  . Lack of Transportation (Non-Medical): No  Physical Activity:   . Days of Exercise per Week:   . Minutes of Exercise per Session:   Stress:   .  Feeling of Stress :   Social Connections:   . Frequency of Communication with Friends and Family:   . Frequency of Social Gatherings with Friends and Family:   .  Attends Religious Services:   . Active Member of Clubs or Organizations:   . Attends Archivist Meetings:   Marland Kitchen Marital Status:   Intimate Partner Violence:   . Fear of Current or Ex-Partner:   . Emotionally Abused:   Marland Kitchen Physically Abused:   . Sexually Abused:     Vital Signs: Blood pressure 120/68, pulse 71, temperature (!) 97.3 F (36.3 C), resp. rate 16, height 5' 10" (1.778 m), weight 275 lb (124.7 kg), SpO2 93 %.  Examination: General Appearance: The patient is well-developed, well-nourished, and in no distress. Skin: Gross inspection of skin unremarkable. Head: normocephalic, no gross deformities. Eyes: no gross deformities noted. ENT: ears appear grossly normal no exudates. Neck: Supple. No thyromegaly. No LAD. Respiratory: diminished breaths sounds. Cardiovascular: Normal S1 and S2 without murmur or rub. Extremities: No cyanosis. pulses are equal. Neurologic: Alert and oriented. No involuntary movements.  LABS: Recent Results (from the past 2160 hour(s))  Novel Coronavirus, NAA ([facility])     Status: None   Collection Time: 09/07/19 12:41 PM   Specimen: Nasopharyngeal(NP) swabs in vial transport medium   NASOPHARYNGE  TESTING  Result Value Ref Range   SARS-CoV-2, NAA Not Detected Not Detected    Comment: This nucleic acid amplification test was developed and its performance characteristics determined by [employer] and [employer]. Nucleic acid amplification tests include PCR and TMA. This test has not been FDA cleared or approved. This test has been authorized by FDA under an Emergency Use Authorization (EUA). This test is only authorized for the duration of time the declaration that circumstances exist justifying the authorization of the emergency use of in vitro diagnostic tests for detection of  SARS-CoV-2 virus and/or diagnosis of COVID-19 infection under section 564(b)(1) of the Act, 21 U.S.C. 462VOJ-5(K) (1), unless the authorization is terminated or revoked sooner. When diagnostic testing is negative, the possibility of a false negative result should be considered in the context of a patient's recent exposures and the presence of clinical signs and symptoms consistent with COVID-19. An individual without symptoms of COVID-19 and who is not shedding SARS-CoV-2 virus would  expect to have a negative (not detected) result in this assay.   Basic Metabolic Panel (BMET)     Status: Abnormal   Collection Time: 09/25/19  6:51 AM  Result Value Ref Range   Sodium 136 135 - 145 mmol/L   Potassium 4.1 3.5 - 5.1 mmol/L   Chloride 97 (L) 98 - 111 mmol/L   CO2 28 22 - 32 mmol/L   Glucose, Bld 180 (H) 70 - 99 mg/dL   BUN 13 8 - 23 mg/dL   Creatinine, Ser 0.87 0.61 - 1.24 mg/dL   Calcium 9.0 8.9 - 10.3 mg/dL   GFR calc non Af Amer >60 >60 mL/min   GFR calc Af Amer >60 >60 mL/min   Anion gap 11 5 - 15    Comment: Performed at [facility], [address] [place], [place] [address]  CBC with Differential/Platelet     Status: Abnormal   Collection Time: 10/02/19  7:05 AM  Result Value Ref Range   WBC 7.9 4.0 - 10.5 K/uL   RBC 4.13 (L) 4.22 - 5.81 MIL/uL   Hemoglobin 13.0 13.0 - 17.0 g/dL   HCT 40.0 39.0 - 52.0 %   MCV 96.9 80.0 - 100.0 fL   MCH 31.5 26.0 - 34.0 pg   MCHC 32.5 30.0 - 36.0 g/dL   RDW 16.8 (H) 11.5 - 15.5 %  Platelets 168 150 - 400 K/uL   nRBC 0.0 0.0 - 0.2 %   Neutrophils Relative % 40 %   Neutro Abs 3.2 1.7 - 7.7 K/uL   Lymphocytes Relative 45 %   Lymphs Abs 3.6 0.7 - 4.0 K/uL   Monocytes Relative 10 %   Monocytes Absolute 0.8 0.1 - 1.0 K/uL   Eosinophils Relative 3 %   Eosinophils Absolute 0.3 0.0 - 0.5 K/uL   Basophils Relative 1 %   Basophils Absolute 0.1 0.0 - 0.1 K/uL   Immature Granulocytes 1 %   Abs Immature Granulocytes 0.06 0.00 - 0.07  K/uL    Comment: Performed at [facility], [address] [place], [place] [address]  Basic metabolic panel     Status: Abnormal   Collection Time: 10/02/19  7:05 AM  Result Value Ref Range   Sodium 138 135 - 145 mmol/L   Potassium 4.3 3.5 - 5.1 mmol/L   Chloride 101 98 - 111 mmol/L   CO2 27 22 - 32 mmol/L   Glucose, Bld 232 (H) 70 - 99 mg/dL   BUN 17 8 - 23 mg/dL   Creatinine, Ser 0.99 0.61 - 1.24 mg/dL   Calcium 8.8 (L) 8.9 - 10.3 mg/dL   GFR calc non Af Amer >60 >60 mL/min   GFR calc Af Amer >60 >60 mL/min   Anion gap 10 5 - 15    Comment: Performed at [facility], [address] [place], [place] [address]  POCT HgB A1C     Status: Abnormal   Collection Time: 10/23/19 10:39 AM  Result Value Ref Range   Hemoglobin A1C 7.3 (A) 4.0 - 5.6 %   HbA1c POC (<> result, manual entry)     HbA1c, POC (prediabetic range)     HbA1c, POC (controlled diabetic range)    SARS CORONAVIRUS 2 (TAT 6-24 HRS) Nasopharyngeal Nasopharyngeal Swab     Status: None   Collection Time: 11/11/19 12:37 PM   Specimen: Nasopharyngeal Swab  Result Value Ref Range   SARS Coronavirus 2 NEGATIVE NEGATIVE    Comment: (NOTE) SARS-CoV-2 target nucleic acids are NOT DETECTED. The SARS-CoV-2 RNA is generally detectable in upper and lower respiratory specimens during the acute phase of infection. Negative results do not preclude SARS-CoV-2 infection, do not rule out co-infections with other pathogens, and should not be used as the sole basis for treatment or other patient management decisions. Negative results must be combined with clinical observations, patient history, and epidemiological information. The expected result is Negative. Fact Sheet for Patients: SugarRoll.be Fact Sheet for Healthcare Providers: https://www.woods-mathews.com/ This test is not yet approved or cleared by the [place] FDA and  has been authorized for detection and/or  diagnosis of SARS-CoV-2 by FDA under an Emergency Use Authorization (EUA). This EUA will remain  in effect (meaning this test can be used) for the duration of the COVID-19 declaration under Section 56 4(b)(1) of the Act, 21 U.S.C. section 360bbb-3(b)(1), unless the authorization is terminated or revoked sooner. Performed at [facility], [place] [address] [place],  [address]     Radiology: No results found.  No results found.  No results found.    Assessment and Plan: Patient Active Problem List   Diagnosis Date Noted  . Chronic obstructive pulmonary disease with acute exacerbation ([place]) 10/23/2019  . Fungal infection of toenail 10/23/2019  . Encounter for general adult medical examination with abnormal findings 08/21/2019  . Need for vaccination against Streptococcus pneumoniae  08/21/2019  . Flu vaccine need 07/14/2019  . Type 2 diabetes mellitus with hyperglycemia ([place]) 04/13/2019  . Hoarseness of voice 03/05/2019  . Acne vulgaris 12/05/2018  . Atrial fibrillation ([place]) 05/02/2018  . Atrial flutter ([place]) 04/09/2018  . Uncontrolled type 2 diabetes mellitus with hyperglycemia ([place]) 01/09/2018  . Nicotine dependence with current use 01/09/2018  . Diabetes mellitus without complication ([place]) 62/69/4854  . Myalgia 09/19/2017  . Vitamin B12 deficiency anemia 09/19/2017  . Common migraine with intractable migraine 09/19/2017  . Chronic hypoxemic respiratory failure ([place]) 09/19/2017  . Peripheral vascular disease, unspecified ([place]) 09/19/2017  . Dysuria 09/19/2017  . Osteoarthritis 09/19/2017  . Diverticulitis of intestine 09/19/2017  . Cervicalgia 09/19/2017  . Unspecified abdominal hernia without obstruction or gangrene 09/19/2017  . Vasomotor rhinitis 09/19/2017  . Occlusion and stenosis of bilateral carotid arteries 09/19/2017  . Generalized anxiety disorder 09/19/2017  . Malaise 09/19/2017  . Chronic bronchitis ([place]) 09/19/2017  . Obstructive sleep apnea  (adult) (pediatric) 09/19/2017  . Cardiac arrhythmia 09/19/2017  . Smoker 04/02/2016  . Pain in the chest 08/20/2014  . SOB (shortness of breath) 08/20/2014  . Coronary artery disease involving native coronary artery of native heart with angina pectoris with documented spasm ([place]) 08/20/2014  . Emphysema of lung ([place]) 08/20/2014  . Coronary artery disease involving native coronary artery of native heart with unstable angina pectoris ([place]) 08/20/2014  . AAA (abdominal aortic aneurysm) without rupture ([place]) 08/20/2014  . Iliac artery aneurysm, bilateral ([place]) 08/20/2014  . Hyperlipidemia 08/20/2014  . Essential hypertension 08/20/2014  . Morbid obesity ([place]) 08/20/2014    1. OSA on CPAP Continue to wear cpap nightly as prescribed. Good control of symptoms.   2. Chronic obstructive pulmonary disease with acute exacerbation (HCC) Severe, unfortunately he continues to smoke.  Continue to use oxygen as prescribed.  3. Chronic hypoxemic respiratory failure ([place]) ONO ordered to recertify oxygen.  - Pulse oximetry, overnight; Future  4. Atrial fibrillation, unspecified type [facility]) Multiple cardioversion in the past. Continue to see cardiology and follow their recommendations.  5. BMI 39.0-39.9,adult comorbidities include afib, HTN and DM.   6. SOB (shortness of breath) Passed 6 minute walk - 6 minute walk  7. Nicotine dependence with current use Smoking cessation counseling: 1. Pt acknowledges the risks of long term smoking, she will try to quite smoking. 2. Options for different medications including nicotine products, chewing gum, patch etc, Wellbutrin and Chantix is discussed 3. Goal and date of compete cessation is discussed 4. Total time spent in smoking cessation is 15 min.   General Counseling: I have discussed the findings of the evaluation and examination with Peder.  I have also discussed any further diagnostic evaluation thatmay be needed or ordered today. Wai verbalizes  understanding of the findings of todays visit. We also reviewed his medications today and discussed drug interactions and side effects including but not limited excessive drowsiness and altered mental states. We also discussed that there is always a risk not just to him but also people around him. he has been encouraged to call the office with any questions or concerns that should arise related to todays visit.  Orders Placed This Encounter  Procedures  . 6 minute walk     Time spent: 30 This patient was seen by Orson Gear AGNP-C in Collaboration with Dr. Devona Konig as a part of collaborative care agreement.   I have personally obtained a history, examined the patient, evaluated laboratory and imaging results, formulated the assessment and plan  and placed orders.    [name], MD [facility] Pulmonary and Critical Care Sleep medicine

## 2019-12-04 ENCOUNTER — Ambulatory Visit (INDEPENDENT_AMBULATORY_CARE_PROVIDER_SITE_OTHER): Payer: Medicare HMO | Admitting: Family

## 2019-12-04 ENCOUNTER — Encounter: Payer: Self-pay | Admitting: Family

## 2019-12-04 VITALS — BP 100/64 | HR 74 | Ht 70.0 in | Wt 273.0 lb

## 2019-12-04 DIAGNOSIS — I714 Abdominal aortic aneurysm, without rupture, unspecified: Secondary | ICD-10-CM

## 2019-12-04 DIAGNOSIS — E782 Mixed hyperlipidemia: Secondary | ICD-10-CM

## 2019-12-04 DIAGNOSIS — J449 Chronic obstructive pulmonary disease, unspecified: Secondary | ICD-10-CM | POA: Diagnosis not present

## 2019-12-04 DIAGNOSIS — I2510 Atherosclerotic heart disease of native coronary artery without angina pectoris: Secondary | ICD-10-CM

## 2019-12-04 DIAGNOSIS — I5020 Unspecified systolic (congestive) heart failure: Secondary | ICD-10-CM

## 2019-12-04 DIAGNOSIS — Z720 Tobacco use: Secondary | ICD-10-CM

## 2019-12-04 DIAGNOSIS — I4892 Unspecified atrial flutter: Secondary | ICD-10-CM

## 2019-12-04 DIAGNOSIS — G4733 Obstructive sleep apnea (adult) (pediatric): Secondary | ICD-10-CM | POA: Diagnosis not present

## 2019-12-04 NOTE — Patient Instructions (Signed)
Medication Instructions:  Your physician recommends that you continue on your current medications as directed. Please refer to the Current Medication list given to you today.' *If you need a refill on your cardiac medications before your next appointment, please call your pharmacy*   Lab Work: None If you have labs (blood work) drawn today and your tests are completely normal, you will receive your results only by: Marland Kitchen MyChart Message (if you have MyChart) OR . A paper copy in the mail If you have any lab test that is abnormal or we need to change your treatment, we will call you to review the results.   Testing/Procedures: None   Follow-Up: At [facility], you and your health needs are our priority.  As part of our continuing mission to provide you with exceptional heart care, we have created designated Provider Care Teams.  These Care Teams include your primary Cardiologist (physician) and Advanced Practice Providers (APPs -  Physician Assistants and Nurse Practitioners) who all work together to provide you with the care you need, when you need it.  We recommend signing up for the patient portal called "MyChart".  Sign up information is provided on this After Visit Summary.  MyChart is used to connect with patients for Virtual Visits (Telemedicine).  Patients are able to view lab/test results, encounter notes, upcoming appointments, etc.  Non-urgent messages can be sent to your provider as well.   To learn more about what you can do with MyChart, go to [email or web address].    Your next appointment:   2 month(s)  The format for your next appointment:   In Person  Provider:   Ida Rogue, MD   Other Instructions

## 2019-12-08 ENCOUNTER — Encounter: Payer: Self-pay | Admitting: Family

## 2019-12-09 DIAGNOSIS — C44629 Squamous cell carcinoma of skin of left upper limb, including shoulder: Secondary | ICD-10-CM | POA: Diagnosis not present

## 2019-12-09 DIAGNOSIS — R6889 Other general symptoms and signs: Secondary | ICD-10-CM | POA: Diagnosis not present

## 2019-12-11 ENCOUNTER — Telehealth: Payer: Self-pay

## 2019-12-11 NOTE — Telephone Encounter (Signed)
Faxed [facility] RX order for overnight ox test for recert on O2. Beth

## 2019-12-15 DIAGNOSIS — J9611 Chronic respiratory failure with hypoxia: Secondary | ICD-10-CM | POA: Diagnosis not present

## 2019-12-31 ENCOUNTER — Other Ambulatory Visit: Payer: Self-pay

## 2019-12-31 NOTE — Patient Outreach (Signed)
[facility]) Care Management  [place]  12/31/2019   Lawrence Steele May 06, 1951 [id number]  Subjective: Telephone call to patient for quarterly disease management check in.  Patient reports he is doing pretty good but some worries about his daughter's health presently.  He has done well post cardioversion.  Denies any signs of A. Fib. He states on the follow up appointment he was in normal rhythm.  Patient A1c has gone up. Discussed with patient his items that he includes in his diet.  Discussed with patient the importance of normal normal portion sizes.  He verbalized understanding and voices no concerns.    Objective:   Encounter Medications:  Outpatient Encounter Medications as of 12/31/2019  Medication Sig  . Accu-Chek Softclix Lancets lancets Use as instructed  Once a daily E11.65  . acetaminophen (TYLENOL) 500 MG tablet Take 1,000 mg by mouth every 6 (six) hours as needed for headache.  . albuterol (PROAIR HFA) 108 (90 Base) MCG/ACT inhaler Inhale 2 puffs into the lungs every 6 (six) hours as needed. (Patient taking differently: Inhale 2 puffs into the lungs every 6 (six) hours as needed for wheezing. Ventolin)  . allopurinol (ZYLOPRIM) 100 MG tablet Take 1 tablet (100 mg total) by mouth 2 (two) times daily.  Marland Kitchen apixaban (ELIQUIS) 5 MG TABS tablet Take 1 tablet (5 mg total) by mouth 2 (two) times daily.  Marland Kitchen atenolol (TENORMIN) 25 MG tablet Take 2 tablets (50 mg) in the morning and 1 tablet (25 mg) in the evening, (Patient taking differently: Take 25-50 mg by mouth 2 (two) times daily. Take 2 tablets (50 mg) in the morning and 1 tablet (25 mg) in the evening,)  . atorvastatin (LIPITOR) 20 MG tablet Take 1 tablet by mouth once daily  . Blood Glucose Monitoring Suppl (ACCU-CHEK AVIVA PLUS) w/Device KIT Use as directed  . calcium carbonate (TUMS - DOSED IN MG ELEMENTAL CALCIUM) 500 MG chewable tablet Chew 1-2 tablets by mouth 3 (three) times daily as needed for  indigestion or heartburn.  . cyclobenzaprine (FLEXERIL) 10 MG tablet Take 1 tablet (10 mg total) by mouth 2 (two) times daily at 10 AM and 5 PM.  . diclofenac sodium (VOLTAREN) 1 % GEL Apply 1 application topically 4 (four) times daily as needed (pain).   Marland Kitchen doxycycline (VIBRA-TABS) 100 MG tablet Take 1 tablet (100 mg total) by mouth daily.  Marland Kitchen dronedarone (MULTAQ) 400 MG tablet Take 1 tablet (400 mg total) by mouth 2 (two) times daily with a meal.  . escitalopram (LEXAPRO) 5 MG tablet Take 1 tablet (5 mg total) by mouth daily.  . Fluticasone-Salmeterol (ADVAIR) 250-50 MCG/DOSE AEPB Inhale 1 puff into the lungs 2 (two) times daily.  . furosemide (LASIX) 20 MG tablet Take 2 tablets (40 mg) by mouth once every other day alternating with 1 tablet (20 mg) by mouth once every other day.  . gabapentin (NEURONTIN) 400 MG capsule Take 400 mg by mouth 4 (four) times daily.   Marland Kitchen glimepiride (AMARYL) 1 MG tablet Take 2 tablets (2 mg total) by mouth daily with supper.  Marland Kitchen glucose blood (ACCU-CHEK AVIVA PLUS) test strip Pt needs accu check Aviva plus lancets, test strips and meter to check blood sugars once daily  . glucose blood (ACCU-CHEK AVIVA PLUS) test strip 1 each by Other route as needed for other. Use as instructed  . hydroxypropyl methylcellulose / hypromellose (ISOPTO TEARS / GONIOVISC) 2.5 % ophthalmic solution Place 1 drop into both eyes 4 (four)  times daily as needed (eye irritation (sand like feeling)).   Marland Kitchen ipratropium (ATROVENT) 0.02 % nebulizer solution Use every 6 hours for Shortness of breath and wheezing and as needed.  Prescription is for 90 days (Patient taking differently: Take 0.5 mg by nebulization every 6 (six) hours as needed for wheezing or shortness of breath. Use every 6 hours for Shortness of breath and wheezing and as needed.  Prescription is for 90 days)  . isosorbide mononitrate (IMDUR) 60 MG 24 hr tablet Take 1 tablet (60 mg) by mouth once daily at bedtime (Patient taking differently:  Take 60 mg by mouth daily. Take 1 tablet (60 mg) by mouth once daily at bedtime)  . loratadine (CLARITIN) 10 MG tablet Take 10 mg by mouth daily.  . meloxicam (MOBIC) 7.5 MG tablet Take 1 tablet (7.5 mg total) by mouth 2 (two) times daily.  . metFORMIN (GLUCOPHAGE) 500 MG tablet Take 1 tablet (500 mg total) by mouth 2 (two) times daily with a meal.  . nitroGLYCERIN (NITROSTAT) 0.4 MG SL tablet Place 0.4 mg under the tongue every 5 (five) minutes x 3 doses as needed for chest pain.   Marland Kitchen nystatin (MYCOSTATIN) 100000 UNIT/ML suspension Take 5 mLs (500,000 Units total) by mouth 4 (four) times daily as needed (mouth/throat irritation.).  Marland Kitchen oxyCODONE-acetaminophen (PERCOCET) 10-325 MG tablet Take 1 tablet by mouth 4 (four) times daily.  . OXYGEN Inhale 2 L into the lungs continuous.   Marland Kitchen tiotropium (SPIRIVA) 18 MCG inhalation capsule Place 18 mcg into inhaler and inhale daily.  . traZODone (DESYREL) 100 MG tablet Take 1 tablet (100 mg total) by mouth at bedtime.  Marland Kitchen umeclidinium bromide (INCRUSE ELLIPTA) 62.5 MCG/INH AEPB Inhale 1 puff into the lungs daily.   No facility-administered encounter medications on file as of 12/31/2019.    Functional Status:  In your present state of health, do you have any difficulty performing the following activities: 11/13/2019 10/08/2019  Hearing? N N  Vision? N N  Difficulty concentrating or making decisions? N N  Walking or climbing stairs? Y N  Comment - -  Dressing or bathing? N N  Doing errands, shopping? - Y  Comment - family helps  Preparing Food and eating ? - N  Using the Toilet? - N  In the past six months, have you accidently leaked urine? - N  Do you have problems with loss of bowel control? - N  Managing your Medications? - N  Managing your Finances? - N  Comment - -  Housekeeping or managing your Housekeeping? - Y  Comment - family helps  Some recent data might be hidden    Fall/Depression Screening: Fall Risk  12/31/2019 10/23/2019 10/08/2019   Falls in the past year? 0 1 0  Comment - - -  Number falls in past yr: - 1 -  Injury with Fall? - 0 -  Comment - - -  Risk for fall due to : - - -  Follow up - - -   PHQ 2/9 Scores 12/31/2019 10/23/2019 10/08/2019 08/21/2019 08/21/2019 07/14/2019 06/09/2019  PHQ - 2 Score 0 0 0 0 0 0 0  PHQ- 9 Score - - - - - - -    Assessment: Patient continues to manage chronic illnesses and benefits from disease management follow up.  Plan:  [facility] CM Care Plan Problem One     Most Recent Value  Care Plan Problem One  Knowledge Deficit in Self Management of Diabetes  Role Documenting the Problem  One  Care Management Telephonic Coordinator  Care Plan for Problem One  Active  [facility] Long Term Goal   Patient will  Patient will see a decrease in A1C in A1C from 6.9 within the next 90 days  [facility] Long Term Goal Start Date  12/31/19  Interventions for Problem One Long Term Goal  Patient A1c reported 7.3.  Discussed with patient importance if diabetes control.  Also disucssed typical meal items of patient and encouraged lowering portion sizes.  Patient not eating breads to help control sugars at this time.       RN CM will contact in the month of July and patient agreeable.   Jone Baseman, RN, MSN [place] [facility] Care Management Coordinator Direct Line [phone] Cell [phone] Toll Free: [phone]  Fax: [phone]

## 2019-12-31 NOTE — Progress Notes (Signed)
Scanned Overnight Ox results. Lawrence Steele

## 2020-01-06 ENCOUNTER — Telehealth: Payer: Self-pay | Admitting: Cardiovascular Disease

## 2020-01-06 DIAGNOSIS — M47896 Other spondylosis, lumbar region: Secondary | ICD-10-CM | POA: Diagnosis not present

## 2020-01-06 DIAGNOSIS — G894 Chronic pain syndrome: Secondary | ICD-10-CM | POA: Diagnosis not present

## 2020-01-06 DIAGNOSIS — M545 Low back pain: Secondary | ICD-10-CM | POA: Diagnosis not present

## 2020-01-06 DIAGNOSIS — Z79899 Other long term (current) drug therapy: Secondary | ICD-10-CM | POA: Diagnosis not present

## 2020-01-06 NOTE — Telephone Encounter (Signed)
STAT if patient feels like he/she is going to faint   1) Are you dizzy now?  YES   2) Do you feel faint or have you passed out? yes  3) Do you have any other symptoms? Feels like o2 is dropping feels dizzy stumbling off balance fell in the yard at home head leg ankle and arm injury major headache   4) Have you checked your HR and BP (record if available)? BP 112/55 HR 74  per pulse oximeter 02 70-80's on RA 90's on 2L patient states o2 is prn   Patient not sure if he can get transportation for acute visit

## 2020-01-06 NOTE — Telephone Encounter (Signed)
Call transferred directly to triage.  The patient called with complaints of severe dizziness that started this morning upon waking at 3 am. He was going to an ortho appointment and his transportation was picking him up a little after 6 am today. The patient reports dizziness at the doctors office earlier today, but no vital signs taken per his report. He reports he was told by his lung doctor he recently did not need 24 hour O2 as his 6 minute walk test showed his O2 sats at 93 %.  The patient states he was so dizzy at [facility] it took him about 7-8 times to try to get up.  He did get back on the transportation bus. When getting off he was unsteady on his feet and told by the driver to be careful getting off. He states went about 10 steps and went to his knees due to dizziness.  He thinks he must have blacked out because he hit his head and got a cut there.  He got in and his O2 sats were low per his report. He put his O2 on on 4 L, and has felt some better through the day.  He reports his O2 sats have dropped periodically through the day.  He is still having blurry vision. I have advised the patient to please call EMS to evaluate him with his symptoms and since he hit his head on anticoagulation.   He is requesting an in office appointment on Friday due to transportation. I have advised I will put him on for 8:30 am with Laurann Montana, NP, but that he needs to call EMS now for evaluation.  The patient voices understanding as well as his daughter, [name] who is in the home with him now.  The patient has given verbal consent for [place] to speak with her.  I have advised her will need to sign a DPR when in the office next time as well.   Contact is Lawrence Steele (daughter). Phone: [phone].

## 2020-01-07 NOTE — Progress Notes (Deleted)
Office Visit    Patient Name: Lawrence Steele Date of Encounter: 01/07/2020  Primary Care Provider:  Ronnell Freshwater, NP Primary Cardiologist:  Ida Rogue, MD Electrophysiologist:  None   Chief Complaint    Lawrence Steele is a 69 y.o. male with a hx of atrial flutter, chronic anticoagulation on apixaban, oxygen dependent COPD, OSA on CPAP, CAD presents today for dizziness.   Past Medical History    Past Medical History:  Diagnosis Date  . AAA (abdominal aortic aneurysm) ([place])    a.  Status post endovascular repair at [facility] in 2016  . Anxiety   . Asthma   . Atrial flutter ([place])    a. diagnosed 7/19; b. CHADS2VASc => 5 (CHF, HTN, age x 1, DM, vascular disease); c. on Eliquis; d. s/p TEE/DCCV 8/19 with repeat DCCV 12/19  . Cancer (HCC)    melanoma L index finger   . Chronic diastolic CHF (congestive heart failure) ([place])   . COPD (chronic obstructive pulmonary disease) ([place])   . Coronary artery disease    a. LHC 5/10: left main 10%, mLAD-1 lesion 20%, mLAD-2 lesion 20%, D1 20%, mLCx-1 lesion 30%, mLCx-2 lesion 50%, OM1 20%, OM to 20%, LPL 20%, pRCA-1 lesion 20%, pRCA-2 lesion 20%.  EF 51% w/ mild inf HK  . Diabetes mellitus with complication ([place])   . Hyperlipidemia   . Hypertension   . Iliac artery aneurysm, bilateral ([place])   . Obstructive sleep apnea-hypopnea syndrome    Past Surgical History:  Procedure Laterality Date  . ABDOMINAL AORTIC ANEURYSM REPAIR  03/06/2015   [place]    . AMPUTATION Left    partial amputation  . APPENDECTOMY    . CARDIAC CATHETERIZATION  2010,03/16/2003,11/04/2002  . CARDIOVERSION N/A 04/24/2018   Procedure: CARDIOVERSION;  Surgeon: Nelva Bush, MD;  Location: [facility];  Service: Cardiovascular;  Laterality: N/A;  . CARDIOVERSION N/A 09/05/2018   Procedure: CARDIOVERSION;  Surgeon: Minna Merritts, MD;  Location: [facility];  Service: Cardiovascular;  Laterality: N/A;  . CARDIOVERSION N/A 11/24/2018   Procedure:  CARDIOVERSION (CATH LAB);  Surgeon: Wellington Hampshire, MD;  Location: [facility];  Service: Cardiovascular;  Laterality: N/A;  . CARDIOVERSION N/A 03/18/2019   Procedure: CARDIOVERSION;  Surgeon: Minna Merritts, MD;  Location: [place] [facility];  Service: Cardiovascular;  Laterality: N/A;  . CARDIOVERSION N/A 11/13/2019   Procedure: CARDIOVERSION;  Surgeon: Minna Merritts, MD;  Location: [facility];  Service: Cardiovascular;  Laterality: N/A;  . CORONARY ANGIOPLASTY  03/16/2003   stent to the RCA  & 2/18/2004stent mid circumflex  . ELBOW SURGERY    . HERNIA REPAIR    . NASAL SINUS SURGERY    . PENILE PROSTHESIS IMPLANT  1995  . PILONIDAL CYST EXCISION    . TEE WITHOUT CARDIOVERSION N/A 04/24/2018   Procedure: TRANSESOPHAGEAL ECHOCARDIOGRAM (TEE);  Surgeon: Nelva Bush, MD;  Location: [facility];  Service: Cardiovascular;  Laterality: N/A;  . TONSILECTOMY, ADENOIDECTOMY, BILATERAL MYRINGOTOMY AND TUBES    . UMBILICAL HERNIA REPAIR     Allergies  No Active Allergies  History of Present Illness    Lawrence Steele is a 69 y.o. 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 He was last seen 12/04/19.   CAD s/p PCI to RCA and LCx 2004. LHC 08/2014 with EF  51% and nonobstructive CAD. Lexiscan 10/2016 EF 62% with frequent PAC, low risk study. Diagnosed with atrial flutter with RVR 03/2018, started on Eliquis, underwent successful TEE guided DCCV with EF 555-60%. CT abd 09/2015 with stable aortoiliac endograft with decreased aneurysm size and stable bilateral common iliac artery aneurysms.   Admitted 09/03/18 atrial flutter with RVR in setting of URI requirin cardioversion. After cardioversion, noted weight gain and started on lasix. Echo 10/09/18 EF 60-65%, mildly dilated LV, gr2DD, mildly dilated LA. Recurrent atrial flutter and cardioversion 11/24/18, was  referred to EP.    Called office 08/2019 with recurrent elevated heart rates. Subsequent workup revealed recurrent atrial flutter. Atenolol dose increased, but BP subsequently dropped. Dr. Caryl Comes discussed case with Dr. Lovena Le & Dr. Rayann Heman who felt he would be a difficult candidate for ablation with his multiple flutters. He was started on Multaq. During televisit 11/03/19 he was scheduled for cardioversion. Underwent DCCV 11/13/19 with restoration of NSR.   Last seen 12/04/19 for post-cardioversion follow up, maintaining SR, stble DOE, and feeling overall well. He was planning to undergo overnight oximetry with pulmonology to determine if he needed oxygen in addition to his CPAP at night. No changes were made.  Called the office 01/06/20 noting severe dizziness on waking at 3am. Went to ortho appt with assistance of transportation. After exiting the transportation bus to walk inside his home per his report took about 10 steps, fell to knees, thinks he blacked out as he had cut on his head. O2 sats were low when he eventually got into his home per his report. Put on his O2 and felt better on 4L. Reports blurry vision. He was encouraged to proceed to ED due to fall, syncope, and head injury in the setting of chronic anticoagulation and this was discussed with his daughter as well.  ***  EKGs/Labs/Other Studies Reviewed:   The following studies were reviewed today:  Echo 10/09/18 - Left ventricle: The cavity size was mildly dilated. Systolic   function was normal. The estimated ejection fraction was in the   range of 60% to 65%. Wall motion was normal; there were no   regional wall motion abnormalities. Features are consistent with   a pseudonormal left ventricular filling pattern, with concomitant   abnormal relaxation and increased filling pressure (grade 2   diastolic dysfunction). - Left atrium: The atrium was mildly dilated. - Right ventricle: Systolic function was normal. - Pulmonary arteries:  Systolic pressure was within the normal   range.   Impressions:   - Challenging image quality. Rhythym appears to be NSR.  EKG:  EKG is  ordered today.  The ekg ordered today demonstrates ***  Recent Labs: 10/02/2019: BUN 17; Creatinine, Ser 0.99; Hemoglobin 13.0; Platelets 168; Potassium 4.3; Sodium 138  Recent Lipid Panel    Component Value Date/Time   CHOL 199 10/25/2016 0753   TRIG 114 10/25/2016 0753   HDL 56 10/25/2016 0753   CHOLHDL 3.6 10/25/2016 0753   VLDL 23 10/25/2016 0753   LDLCALC 120 (H) 10/25/2016 0753    Home Medications   No outpatient medications have been marked as taking for the 01/08/20 encounter (Appointment) with Loel Dubonnet, NP.      Review of Systems    ***   ROS All other systems reviewed and are otherwise negative except as noted above.  Physical Exam    VS:  There were no vitals taken for this visit. , BMI There is no height or weight on file to calculate  BMI. GEN: Well nourished, well developed, in no acute distress. HEENT: normal. Neck: Supple, no JVD, carotid bruits, or masses. Cardiac: ***RRR, no murmurs, rubs, or gallops. No clubbing, cyanosis, edema.  ***Radials/DP/PT 2+ and equal bilaterally.  Respiratory:  ***Respirations regular and unlabored, clear to auscultation bilaterally. GI: Soft, nontender, nondistended, BS + x 4. MS: No deformity or atrophy. Skin: Warm and dry, no rash. Neuro:  Strength and sensation are intact. Psych: Normal affect.  Accessory Clinical Findings    ECG personally reviewed by me today - *** - no acute changes.  Assessment & Plan    1. Atrial flutter -  2. Chronic anticoagulation -  3. CAD without angina -  4. Chronic HFpEF -  5. OSA on CPAP - Compliant with CPAP. Follows with pulmonology.  6. HTN -  7. AAA s/p EVAR 01/2015 - Follows with [facility] vascular. Smoking cessation and optimal BP encouraged.  8. Tobacco abuse - Smoking cessation encouraged. Recommend utilization of  [id number]. 9. Severe COPD on oxygen - Follows with pulmonology Dr. Humphrey Rolls. Passed 6 minute walk test at recent visit 12/03/19 with their office.  10. HLD, LDL goal <70 -  11. DM2 -  12. Morbid obesity - Weight loss via diet and exercise encouraged.   Disposition: Follow up {follow up:15908} with ***   Loel Dubonnet, NP 01/07/2020, 10:43 AM

## 2020-01-08 ENCOUNTER — Ambulatory Visit: Payer: Medicare HMO | Admitting: Family

## 2020-01-08 NOTE — Telephone Encounter (Signed)
Pt was a no show to appt this morning. Daughter reports he is sleeping and told her he meant to cancel because he is "feeling better". Daughter went ahead and rescheduled for this coming Wednesday 4/28 @ 8 am. She will attend appt with him.   Routing to provider to make aware.

## 2020-01-13 ENCOUNTER — Ambulatory Visit: Payer: Medicare HMO | Admitting: Family

## 2020-01-15 ENCOUNTER — Telehealth: Payer: Self-pay

## 2020-01-15 NOTE — Telephone Encounter (Signed)
Confirmed and screened for 01-19-20 ov. 

## 2020-01-19 ENCOUNTER — Ambulatory Visit (INDEPENDENT_AMBULATORY_CARE_PROVIDER_SITE_OTHER): Payer: Medicare HMO | Admitting: Nurse Practitioner

## 2020-01-19 ENCOUNTER — Other Ambulatory Visit: Payer: Self-pay

## 2020-01-19 ENCOUNTER — Encounter: Payer: Self-pay | Admitting: Nurse Practitioner

## 2020-01-19 VITALS — BP 98/51 | HR 63 | Temp 97.2°F | Resp 16 | Ht 70.0 in | Wt 274.4 lb

## 2020-01-19 DIAGNOSIS — I482 Chronic atrial fibrillation, unspecified: Secondary | ICD-10-CM

## 2020-01-19 DIAGNOSIS — E1165 Type 2 diabetes mellitus with hyperglycemia: Secondary | ICD-10-CM | POA: Diagnosis not present

## 2020-01-19 DIAGNOSIS — R29818 Other symptoms and signs involving the nervous system: Secondary | ICD-10-CM | POA: Diagnosis not present

## 2020-01-19 DIAGNOSIS — R55 Syncope and collapse: Secondary | ICD-10-CM | POA: Diagnosis not present

## 2020-01-19 LAB — POCT GLYCOSYLATED HEMOGLOBIN (HGB A1C): Hemoglobin A1C: 5.8 % — AB (ref 4.0–5.6)

## 2020-01-19 NOTE — Progress Notes (Signed)
[facility] [place], [place] [address]  Internal MEDICINE  Office Visit Note  Patient Name: Lawrence Steele  [id number]  [id number]  Date of Service: 01/31/2020  Chief Complaint  Patient presents with  . Diabetes  . Hypertension  . Hyperlipidemia  . Dizziness    has been going on for about 2 years on and off     The patient is here for follow up visit. He states that he has been having some trouble with dizzy spells. Getting so bad, he is falling frequently. Patient has mentioned this to his cardiologist. Was told that MRI of the brain might be useful. Blood pressure on low side today. He states that he felt this way this morning. Took blood pressure and it was 161/50.  Blood sugars are lower than they had been previously. His HgbA1c is 5.8 today, down from 7.2 at the last check. He states that he has had some lower blood sugar readings. Sometimes, they are in the 49s and 60s. When they are low, he feels rather sleepy.       Current Medication: Outpatient Encounter Medications as of 01/19/2020  Medication Sig Note  . Accu-Chek Softclix Lancets lancets Use as instructed  Once a daily E11.65   . acetaminophen (TYLENOL) 500 MG tablet Take 1,000 mg by mouth every 6 (six) hours as needed for headache.   . albuterol (PROAIR HFA) 108 (90 Base) MCG/ACT inhaler Inhale 2 puffs into the lungs every 6 (six) hours as needed. (Patient taking differently: Inhale 2 puffs into the lungs every 6 (six) hours as needed for wheezing. Ventolin)   . allopurinol (ZYLOPRIM) 100 MG tablet Take 1 tablet (100 mg total) by mouth 2 (two) times daily.   Marland Kitchen atenolol (TENORMIN) 25 MG tablet Take 2 tablets (50 mg) in the morning and 1 tablet (25 mg) in the evening, (Patient taking differently: Take 25-50 mg by mouth 2 (two) times daily. Take 2 tablets (50 mg) in the morning and 1 tablet (25 mg) in the evening,)   . atorvastatin (LIPITOR) 20 MG tablet Take 1 tablet by mouth once daily   . Blood  Glucose Monitoring Suppl (ACCU-CHEK AVIVA PLUS) w/Device KIT Use as directed   . calcium carbonate (TUMS - DOSED IN MG ELEMENTAL CALCIUM) 500 MG chewable tablet Chew 1-2 tablets by mouth 3 (three) times daily as needed for indigestion or heartburn.   . cyclobenzaprine (FLEXERIL) 10 MG tablet Take 1 tablet (10 mg total) by mouth 2 (two) times daily at 10 AM and 5 PM.   . diclofenac sodium (VOLTAREN) 1 % GEL Apply 1 application topically 4 (four) times daily as needed (pain).    Marland Kitchen doxycycline (VIBRA-TABS) 100 MG tablet Take 1 tablet (100 mg total) by mouth daily.   Marland Kitchen dronedarone (MULTAQ) 400 MG tablet Take 1 tablet (400 mg total) by mouth 2 (two) times daily with a meal.   . escitalopram (LEXAPRO) 5 MG tablet Take 1 tablet (5 mg total) by mouth daily.   . Fluticasone-Salmeterol (ADVAIR) 250-50 MCG/DOSE AEPB Inhale 1 puff into the lungs 2 (two) times daily.   . furosemide (LASIX) 20 MG tablet Take 2 tablets (40 mg) by mouth once every other day alternating with 1 tablet (20 mg) by mouth once every other day.   . gabapentin (NEURONTIN) 400 MG capsule Take 400 mg by mouth 4 (four) times daily.    Marland Kitchen glucose blood (ACCU-CHEK AVIVA PLUS) test strip Pt needs accu check Aviva plus lancets,  test strips and meter to check blood sugars once daily   . glucose blood (ACCU-CHEK AVIVA PLUS) test strip 1 each by Other route as needed for other. Use as instructed   . hydroxypropyl methylcellulose / hypromellose (ISOPTO TEARS / GONIOVISC) 2.5 % ophthalmic solution Place 1 drop into both eyes 4 (four) times daily as needed (eye irritation (sand like feeling)).    Marland Kitchen ipratropium (ATROVENT) 0.02 % nebulizer solution Use every 6 hours for Shortness of breath and wheezing and as needed.  Prescription is for 90 days (Patient taking differently: Take 0.5 mg by nebulization every 6 (six) hours as needed for wheezing or shortness of breath. Use every 6 hours for Shortness of breath and wheezing and as needed.  Prescription is for 90  days)   . loratadine (CLARITIN) 10 MG tablet Take 10 mg by mouth daily.   . meloxicam (MOBIC) 7.5 MG tablet Take 1 tablet (7.5 mg total) by mouth 2 (two) times daily.   . metFORMIN (GLUCOPHAGE) 500 MG tablet Take 1 tablet (500 mg total) by mouth 2 (two) times daily with a meal.   . nitroGLYCERIN (NITROSTAT) 0.4 MG SL tablet Place 0.4 mg under the tongue every 5 (five) minutes x 3 doses as needed for chest pain.    Marland Kitchen nystatin (MYCOSTATIN) 100000 UNIT/ML suspension Take 5 mLs (500,000 Units total) by mouth 4 (four) times daily as needed (mouth/throat irritation.).   Marland Kitchen oxyCODONE-acetaminophen (PERCOCET) 10-325 MG tablet Take 1 tablet by mouth 4 (four) times daily.   . OXYGEN Inhale 2 L into the lungs continuous.    Marland Kitchen tiotropium (SPIRIVA) 18 MCG inhalation capsule Place 18 mcg into inhaler and inhale daily.   . traZODone (DESYREL) 100 MG tablet Take 1 tablet (100 mg total) by mouth at bedtime.   Marland Kitchen umeclidinium bromide (INCRUSE ELLIPTA) 62.5 MCG/INH AEPB Inhale 1 puff into the lungs daily.   . [DISCONTINUED] apixaban (ELIQUIS) 5 MG TABS tablet Take 1 tablet (5 mg total) by mouth 2 (two) times daily.   . [DISCONTINUED] glimepiride (AMARYL) 1 MG tablet Take 2 tablets (2 mg total) by mouth daily with supper. 01/19/2020: patient to hold this for now   . [DISCONTINUED] isosorbide mononitrate (IMDUR) 60 MG 24 hr tablet Take 1 tablet (60 mg) by mouth once daily at bedtime (Patient taking differently: Take 60 mg by mouth daily. Take 1 tablet (60 mg) by mouth once daily at bedtime)    No facility-administered encounter medications on file as of 01/19/2020.    Surgical History: Past Surgical History:  Procedure Laterality Date  . ABDOMINAL AORTIC ANEURYSM REPAIR  03/06/2015   [facility]    . AMPUTATION Left    partial amputation  . APPENDECTOMY    . CARDIAC CATHETERIZATION  2010,03/16/2003,11/04/2002  . CARDIOVERSION N/A 04/24/2018   Procedure: CARDIOVERSION;  Surgeon: Nelva Bush, MD;   Location: [facility];  Service: Cardiovascular;  Laterality: N/A;  . CARDIOVERSION N/A 09/05/2018   Procedure: CARDIOVERSION;  Surgeon: Minna Merritts, MD;  Location: [facility];  Service: Cardiovascular;  Laterality: N/A;  . CARDIOVERSION N/A 11/24/2018   Procedure: CARDIOVERSION (CATH LAB);  Surgeon: Wellington Hampshire, MD;  Location: [facility];  Service: Cardiovascular;  Laterality: N/A;  . CARDIOVERSION N/A 03/18/2019   Procedure: CARDIOVERSION;  Surgeon: Minna Merritts, MD;  Location: [facility];  Service: Cardiovascular;  Laterality: N/A;  . CARDIOVERSION N/A 11/13/2019   Procedure: CARDIOVERSION;  Surgeon: Minna Merritts, MD;  Location: [facility];  Service:  Cardiovascular;  Laterality: N/A;  . CORONARY ANGIOPLASTY  03/16/2003   stent to the RCA  & 2/18/2004stent mid circumflex  . ELBOW SURGERY    . HERNIA REPAIR    . NASAL SINUS SURGERY    . PENILE PROSTHESIS IMPLANT  1995  . PILONIDAL CYST EXCISION    . TEE WITHOUT CARDIOVERSION N/A 04/24/2018   Procedure: TRANSESOPHAGEAL ECHOCARDIOGRAM (TEE);  Surgeon: Nelva Bush, MD;  Location: [facility];  Service: Cardiovascular;  Laterality: N/A;  . TONSILECTOMY, ADENOIDECTOMY, BILATERAL MYRINGOTOMY AND TUBES    . UMBILICAL HERNIA REPAIR      Medical History: Past Medical History:  Diagnosis Date  . AAA (abdominal aortic aneurysm) ([place])    a.  Status post endovascular repair at [facility] in 2016  . Anxiety   . Asthma   . Atrial flutter ([place])    a. diagnosed 7/19; b. CHADS2VASc => 5 (CHF, HTN, age x 1, DM, vascular disease); c. on Eliquis; d. s/p TEE/DCCV 8/19 with repeat DCCV 12/19  . Cancer (HCC)    melanoma L index finger   . Chronic diastolic CHF (congestive heart failure) ([place])   . COPD (chronic obstructive pulmonary disease) ([place])   . Coronary artery disease    a. LHC 5/10: left main 10%, mLAD-1 lesion 20%, mLAD-2 lesion 20%, D1 20%, mLCx-1 lesion 30%, mLCx-2 lesion 50%, OM1 20%, OM to 20%, LPL 20%, pRCA-1 lesion 20%, pRCA-2 lesion 20%.   EF 51% w/ mild inf HK  . Diabetes mellitus with complication ([place])   . Hyperlipidemia   . Hypertension   . Iliac artery aneurysm, bilateral ([place])   . Obstructive sleep apnea-hypopnea syndrome     Family History: Family History  Problem Relation Age of Onset  . Hypertension Mother   . Hyperlipidemia Mother   . Depression Mother   . Heart attack Father 63  . Heart disease Father   . Hypertension Father   . Hyperlipidemia Father     Social History   Socioeconomic History  . Marital status: Divorced    Spouse name: Not on file  . Number of children: 2  . Years of education: 10   . Highest education level: 10th grade  Occupational History  . Occupation: disabled   Tobacco Use  . Smoking status: Current Every Day Smoker    Packs/day: 0.25    Years: 45.00    Pack years: 11.25    Types: Cigarettes  . Smokeless tobacco: Former Systems developer    Types: Chew  . Tobacco comment: 3 to 4 cigarettes a day  Substance and Sexual Activity  . Alcohol use: Not Currently  . Drug use: No  . Sexual activity: Not on file  Other Topics Concern  . Not on file  Social History Narrative  . Not on file   Social Determinants of Health   Financial Resource Strain:   . Difficulty of Paying Living Expenses:   Food Insecurity: No Food Insecurity  . Worried About Charity fundraiser in the Last Year: Never true  . Ran Out of Food in the Last Year: Never true  Transportation Needs: No Transportation Needs  . Lack of Transportation (Medical): No  . Lack of Transportation (Non-Medical): No  Physical Activity:   . Days of Exercise per Week:   . Minutes of Exercise per Session:   Stress:   . Feeling of Stress :   Social Connections:   . Frequency of Communication with Friends and Family:   . Frequency of Social Gatherings with  Friends and Family:   . Attends Religious Services:   . Active Member of Clubs or Organizations:   . Attends Archivist Meetings:   Marland Kitchen Marital Status:   Intimate  Partner Violence:   . Fear of Current or Ex-Partner:   . Emotionally Abused:   Marland Kitchen Physically Abused:   . Sexually Abused:       Review of Systems  Constitutional: Positive for activity change and fatigue. Negative for unexpected weight change.  HENT: Negative for postnasal drip, rhinorrhea, sore throat and voice change.   Respiratory: Positive for cough, shortness of breath and wheezing.        Intermittent wheezing.  Cardiovascular: Positive for palpitations. Negative for chest pain.       Seeing cardiology for chronic a-fib.  Blood pressure very low today. Patient feeling dizzy.   Gastrointestinal: Negative for constipation, diarrhea, nausea and vomiting.  Endocrine: Negative for cold intolerance, heat intolerance, polydipsia and polyuria.       Blood sugars much lower than prior checks. HgbA1c 5.8 today, down from 7.2 at his last check.   Musculoskeletal: Positive for arthralgias, back pain and myalgias.  Skin: Negative for rash.       Long, thick, and ingrowing toenails. Worse on the left side.   Allergic/Immunologic: Positive for environmental allergies.  Neurological: Positive for dizziness and headaches. Negative for seizures.  Hematological: Negative for adenopathy.  Psychiatric/Behavioral: Positive for dysphoric mood. The patient is nervous/anxious.     Today's Vitals   01/19/20 1356  BP: (!) 98/51  Pulse: 63  Resp: 16  Temp: (!) 97.2 F (36.2 C)  SpO2: 96%  Weight: 274 lb 6.4 oz (124.5 kg)  Height: 5' 10"  (1.778 m)   Body mass index is 39.37 kg/m.  Physical Exam Vitals and nursing note reviewed.  Constitutional:      Appearance: Normal appearance. He is well-developed.  HENT:     Head: Normocephalic.     Nose: Nose normal.  Eyes:     Conjunctiva/sclera: Conjunctivae normal.     Pupils: Pupils are equal, round, and reactive to light.  Neck:     Thyroid: No thyromegaly.     Vascular: No carotid bruit or JVD.     Trachea: No tracheal deviation.   Cardiovascular:     Rate and Rhythm: Normal rate. Rhythm irregular. Frequent extrasystoles are present.    Heart sounds: Murmur present. Systolic murmur present with a grade of 2/6.     Comments: Intermittent premature beat.  Pulmonary:     Effort: Pulmonary effort is normal. No accessory muscle usage or respiratory distress.     Breath sounds: Examination of the right-upper field reveals rhonchi. Examination of the left-upper field reveals rhonchi. Examination of the right-middle field reveals rhonchi. Examination of the left-middle field reveals rhonchi. Wheezing and rhonchi present.     Comments: Rhonchi heard throughout the lung fields. Not clearing with cough. Tenderness along the right lower chest with mild palpation.  Abdominal:     Palpations: Abdomen is soft.  Musculoskeletal:        General: Normal range of motion.     Cervical back: Normal range of motion and neck supple.  Lymphadenopathy:     Cervical: No cervical adenopathy.  Skin:    General: Skin is warm and dry.     Comments: Thickened and discolored toenails of the toenails of both great toes.   Neurological:     General: No focal deficit present.     Mental Status:  He is alert and oriented to person, place, and time.  Psychiatric:        Mood and Affect: Mood is anxious and depressed.        Speech: Speech normal.        Behavior: Behavior normal.        Thought Content: Thought content normal.        Judgment: Judgment normal.    Assessment/Plan: 1. Type 2 diabetes mellitus with hyperglycemia, without long-term current use of insulin (HCC) - POCT HgB A1C 5.8 today. Will have him reduce metformin dose to 273m twice daily. Advised him to monitor blood sugars closely.   2. Syncope and collapse Carotid doppler study ordered today for further evaluation.  - [place] Bilateral; Future  3. Chronic atrial fibrillation ([place] Continue regular visit with cardiology as scheduled. Message sent to cardiology office  informing them of patient's current symptoms and condition.   4. Other symptoms and signs involving the nervous system Will get MRI of brain for further evaluation.  - MR Brain W Wo Contrast; Future  General Counseling: Philopateer verbalizes understanding of the findings of todays visit and agrees with plan of treatment. I have discussed any further diagnostic evaluation that may be needed or ordered today. We also reviewed his medications today. he has been encouraged to call the office with any questions or concerns that should arise related to todays visit.  This patient was seen by HLeretha PolFNP Collaboration with Dr FLavera Guiseas a part of collaborative care agreement  Orders Placed This Encounter  Procedures  . [place] Bilateral  . MR Brain W Wo Contrast  . POCT HgB A1C     Total time spent: 30 Minutes   Time spent includes review of chart, medications, test results, and follow up plan with the patient.      Dr FLavera GuiseInternal medicine

## 2020-01-20 ENCOUNTER — Telehealth: Payer: Self-pay | Admitting: Cardiovascular Disease

## 2020-01-20 MED ORDER — ISOSORBIDE MONONITRATE ER 30 MG PO TB24: 30.0000 mg | ORAL_TABLET | Freq: Every day | ORAL | 3 refills | Status: DC

## 2020-01-20 NOTE — Telephone Encounter (Signed)
Received a phone call from primary care that blood pressure is low and he was dizzy On last clinic visit with [place] blood pressure was low [id number] systolic Can we suggest he cut his isosorbide back to 30 mg daily not 60 Monitor blood pressure/orthostatics at home, please call with numbers He can set up appointment and follow-up if he would like  thx TGollan

## 2020-01-20 NOTE — Telephone Encounter (Signed)
Decreased isosorbide mononitrate down to 30 mg once daily. Called and left voicemail message for patient to call back regarding provider recommendations.

## 2020-01-20 NOTE — Addendum Note (Signed)
Addended by: Valora Corporal on: 01/20/2020 08:24 AM   Modules accepted: Orders

## 2020-01-20 NOTE — Telephone Encounter (Signed)
Spoke with patient and reviewed that provider would like him to decrease his isosorbide mononitrate to 30 mg once daily and that I sent in updated prescription for that. He does have upcoming appointment with Dr. Rockey Situ and verbalized understanding of instructions on medication dosage change. Also advised to monitor blood pressures as well. He verbalized understanding of our conversation, instructions, and appointment date coming up with no further questions at this time.

## 2020-01-25 ENCOUNTER — Ambulatory Visit: Payer: Medicare HMO | Admitting: Nurse Practitioner

## 2020-01-26 ENCOUNTER — Other Ambulatory Visit: Payer: Self-pay | Admitting: Cardiovascular Disease

## 2020-01-26 NOTE — Telephone Encounter (Signed)
Please review for refill on Eliquis.  Thanks  

## 2020-01-27 ENCOUNTER — Other Ambulatory Visit: Payer: Self-pay

## 2020-01-27 MED ORDER — APIXABAN 5 MG PO TABS: 5.0000 mg | ORAL_TABLET | Freq: Two times a day (BID) | ORAL | 6 refills | Status: DC

## 2020-01-31 DIAGNOSIS — R29818 Other symptoms and signs involving the nervous system: Secondary | ICD-10-CM | POA: Insufficient documentation

## 2020-01-31 DIAGNOSIS — R55 Syncope and collapse: Secondary | ICD-10-CM | POA: Insufficient documentation

## 2020-02-01 ENCOUNTER — Other Ambulatory Visit: Payer: Self-pay | Admitting: Cardiovascular Disease

## 2020-02-01 ENCOUNTER — Other Ambulatory Visit: Payer: Self-pay

## 2020-02-01 DIAGNOSIS — F411 Generalized anxiety disorder: Secondary | ICD-10-CM

## 2020-02-01 MED ORDER — TRAZODONE HCL 100 MG PO TABS: 100.0000 mg | ORAL_TABLET | Freq: Every day | ORAL | 1 refills | Status: DC

## 2020-02-01 MED ORDER — METFORMIN HCL 500 MG PO TABS: 500.0000 mg | ORAL_TABLET | Freq: Two times a day (BID) | ORAL | 1 refills | Status: DC

## 2020-02-01 NOTE — Telephone Encounter (Signed)
Refill Request.  

## 2020-02-02 ENCOUNTER — Telehealth: Payer: Self-pay

## 2020-02-02 NOTE — Telephone Encounter (Signed)
Confirmed appointment on 02/04/2020 and screened for covid. [facility] 

## 2020-02-03 ENCOUNTER — Ambulatory Visit: Payer: Medicare HMO | Admitting: Cardiovascular Disease

## 2020-02-04 ENCOUNTER — Encounter: Payer: Self-pay | Admitting: Internal Medicine

## 2020-02-04 ENCOUNTER — Ambulatory Visit: Payer: Medicare HMO | Admitting: Internal Medicine

## 2020-02-04 ENCOUNTER — Other Ambulatory Visit: Payer: Self-pay

## 2020-02-04 VITALS — BP 109/55 | HR 76 | Temp 97.4°F | Resp 16 | Ht 70.0 in | Wt 273.8 lb

## 2020-02-04 DIAGNOSIS — J449 Chronic obstructive pulmonary disease, unspecified: Secondary | ICD-10-CM | POA: Diagnosis not present

## 2020-02-04 DIAGNOSIS — Z9989 Dependence on other enabling machines and devices: Secondary | ICD-10-CM | POA: Diagnosis not present

## 2020-02-04 DIAGNOSIS — G4733 Obstructive sleep apnea (adult) (pediatric): Secondary | ICD-10-CM | POA: Diagnosis not present

## 2020-02-04 DIAGNOSIS — I482 Chronic atrial fibrillation, unspecified: Secondary | ICD-10-CM

## 2020-02-04 DIAGNOSIS — R0602 Shortness of breath: Secondary | ICD-10-CM | POA: Diagnosis not present

## 2020-02-04 MED ORDER — DALIRESP 250 MCG PO TABS: 250.0000 ug | ORAL_TABLET | Freq: Every day | ORAL | 4 refills | Status: DC

## 2020-02-04 NOTE — Progress Notes (Signed)
[facility] [place], [place] [address]  Pulmonary Sleep Medicine   Office Visit Note  Patient Name: Lawrence Steele DOB: 02-05-1951 MRN [id number]  Date of Service: 02/04/2020  Complaints/HPI: Patient has been having increasing shortness of breath lately.  He states that when he is exerting himself he notes that his heart rate is beating rapidly and also has been having worsening of his shortness of breath.  He was seen by cardiology found to be in atrial fibrillation.  He underwent defibrillation really without any success.  States that he is supposed to go and have another assessment done by cardiology and the possibility of ablation has been entertained.  In addition he has been feeling dizzy carotid Dopplers have been ordered for his dizziness.  As far as his breathing is concerned it may very well be related to the cardiac issues but I also did review his pulmonary medication Currently he is on Advair as well as being on albuterol and Spiriva.  He feels as though the Spiriva helps in the past.  Has not been on Daliresp and I recommended that we could consider doing this I do want to avoid any other stimulants or adrenergic drugs until his cardiac issue is under control.  ROS  General: (-) fever, (-) chills, (-) night sweats, (-) weakness Skin: (-) rashes, (-) itching,. Eyes: (-) visual changes, (-) redness, (-) itching. Nose and Sinuses: (-) nasal stuffiness or itchiness, (-) postnasal drip, (-) nosebleeds, (-) sinus trouble. Mouth and Throat: (-) sore throat, (-) hoarseness. Neck: (-) swollen glands, (-) enlarged thyroid, (-) neck pain. Respiratory: - cough, (-) bloody sputum, + shortness of breath, - wheezing. Cardiovascular: - ankle swelling, (-) chest pain. Lymphatic: (-) lymph node enlargement. Neurologic: (-) numbness, (-) tingling. Psychiatric: (-) anxiety, (-) depression   Current Medication: Outpatient Encounter Medications as of 02/04/2020   Medication Sig  . Accu-Chek Softclix Lancets lancets Use as instructed  Once a daily E11.65  . acetaminophen (TYLENOL) 500 MG tablet Take 1,000 mg by mouth every 6 (six) hours as needed for headache.  . albuterol (PROAIR HFA) 108 (90 Base) MCG/ACT inhaler Inhale 2 puffs into the lungs every 6 (six) hours as needed. (Patient taking differently: Inhale 2 puffs into the lungs every 6 (six) hours as needed for wheezing. Ventolin)  . allopurinol (ZYLOPRIM) 100 MG tablet Take 1 tablet (100 mg total) by mouth 2 (two) times daily.  Marland Kitchen atenolol (TENORMIN) 25 MG tablet Take 2 tablets (50 mg) in the morning and 1 tablet (25 mg) in the evening, (Patient taking differently: Take 25-50 mg by mouth 2 (two) times daily. Take 2 tablets (50 mg) in the morning and 1 tablet (25 mg) in the evening,)  . atorvastatin (LIPITOR) 20 MG tablet Take 1 tablet by mouth once daily  . Blood Glucose Monitoring Suppl (ACCU-CHEK AVIVA PLUS) w/Device KIT Use as directed  . calcium carbonate (TUMS - DOSED IN MG ELEMENTAL CALCIUM) 500 MG chewable tablet Chew 1-2 tablets by mouth 3 (three) times daily as needed for indigestion or heartburn.  . cyclobenzaprine (FLEXERIL) 10 MG tablet Take 1 tablet (10 mg total) by mouth 2 (two) times daily at 10 AM and 5 PM.  . diclofenac sodium (VOLTAREN) 1 % GEL Apply 1 application topically 4 (four) times daily as needed (pain).   Marland Kitchen doxycycline (VIBRA-TABS) 100 MG tablet Take 1 tablet (100 mg total) by mouth daily.  Marland Kitchen dronedarone (MULTAQ) 400 MG tablet Take 1 tablet (400 mg total)  by mouth 2 (two) times daily with a meal.  . ELIQUIS 5 MG TABS tablet Take 1 tablet by mouth twice daily  . escitalopram (LEXAPRO) 5 MG tablet Take 1 tablet (5 mg total) by mouth daily.  . Fluticasone-Salmeterol (ADVAIR) 250-50 MCG/DOSE AEPB Inhale 1 puff into the lungs 2 (two) times daily.  . furosemide (LASIX) 20 MG tablet Take 2 tablets (40 mg) by mouth once every other day alternating with 1 tablet (20 mg) by mouth once  every other day.  . gabapentin (NEURONTIN) 400 MG capsule Take 400 mg by mouth 4 (four) times daily.   Marland Kitchen glucose blood (ACCU-CHEK AVIVA PLUS) test strip Pt needs accu check Aviva plus lancets, test strips and meter to check blood sugars once daily  . glucose blood (ACCU-CHEK AVIVA PLUS) test strip 1 each by Other route as needed for other. Use as instructed  . hydroxypropyl methylcellulose / hypromellose (ISOPTO TEARS / GONIOVISC) 2.5 % ophthalmic solution Place 1 drop into both eyes 4 (four) times daily as needed (eye irritation (sand like feeling)).   Marland Kitchen ipratropium (ATROVENT) 0.02 % nebulizer solution Use every 6 hours for Shortness of breath and wheezing and as needed.  Prescription is for 90 days (Patient taking differently: Take 0.5 mg by nebulization every 6 (six) hours as needed for wheezing or shortness of breath. Use every 6 hours for Shortness of breath and wheezing and as needed.  Prescription is for 90 days)  . isosorbide mononitrate (IMDUR) 30 MG 24 hr tablet Take 1 tablet (30 mg total) by mouth daily.  Marland Kitchen loratadine (CLARITIN) 10 MG tablet Take 10 mg by mouth daily.  . meloxicam (MOBIC) 7.5 MG tablet Take 1 tablet (7.5 mg total) by mouth 2 (two) times daily.  . metFORMIN (GLUCOPHAGE) 500 MG tablet Take 1 tablet (500 mg total) by mouth 2 (two) times daily with a meal.  . nitroGLYCERIN (NITROSTAT) 0.4 MG SL tablet Place 0.4 mg under the tongue every 5 (five) minutes x 3 doses as needed for chest pain.   Marland Kitchen nystatin (MYCOSTATIN) 100000 UNIT/ML suspension Take 5 mLs (500,000 Units total) by mouth 4 (four) times daily as needed (mouth/throat irritation.).  Marland Kitchen oxyCODONE-acetaminophen (PERCOCET) 10-325 MG tablet Take 1 tablet by mouth 4 (four) times daily.  . OXYGEN Inhale 2 L into the lungs continuous.   Marland Kitchen tiotropium (SPIRIVA) 18 MCG inhalation capsule Place 18 mcg into inhaler and inhale daily.  . traZODone (DESYREL) 100 MG tablet Take 1 tablet (100 mg total) by mouth at bedtime.  Marland Kitchen  umeclidinium bromide (INCRUSE ELLIPTA) 62.5 MCG/INH AEPB Inhale 1 puff into the lungs daily.   No facility-administered encounter medications on file as of 02/04/2020.    Surgical History: Past Surgical History:  Procedure Laterality Date  . ABDOMINAL AORTIC ANEURYSM REPAIR  03/06/2015   [facility]    . AMPUTATION Left    partial amputation  . APPENDECTOMY    . CARDIAC CATHETERIZATION  2010,03/16/2003,11/04/2002  . CARDIOVERSION N/A 04/24/2018   Procedure: CARDIOVERSION;  Surgeon: Nelva Bush, MD;  Location: [facility];  Service: Cardiovascular;  Laterality: N/A;  . CARDIOVERSION N/A 09/05/2018   Procedure: CARDIOVERSION;  Surgeon: Minna Merritts, MD;  Location: [facility];  Service: Cardiovascular;  Laterality: N/A;  . CARDIOVERSION N/A 11/24/2018   Procedure: CARDIOVERSION (CATH LAB);  Surgeon: Wellington Hampshire, MD;  Location: [facility];  Service: Cardiovascular;  Laterality: N/A;  . CARDIOVERSION N/A 03/18/2019   Procedure: CARDIOVERSION;  Surgeon: Minna Merritts,  MD;  Location: [facility];  Service: Cardiovascular;  Laterality: N/A;  . CARDIOVERSION N/A 11/13/2019   Procedure: CARDIOVERSION;  Surgeon: Minna Merritts, MD;  Location: [facility];  Service: Cardiovascular;  Laterality: N/A;  . CORONARY ANGIOPLASTY  03/16/2003   stent to the RCA  & 2/18/2004stent mid circumflex  . ELBOW SURGERY    . HERNIA REPAIR    . NASAL SINUS SURGERY    . PENILE PROSTHESIS IMPLANT  1995  . PILONIDAL CYST EXCISION    . TEE WITHOUT CARDIOVERSION N/A 04/24/2018   Procedure: TRANSESOPHAGEAL ECHOCARDIOGRAM (TEE);  Surgeon: Nelva Bush, MD;  Location: [facility];  Service: Cardiovascular;  Laterality: N/A;  . TONSILECTOMY, ADENOIDECTOMY, BILATERAL MYRINGOTOMY AND TUBES    . UMBILICAL HERNIA REPAIR      Medical History: Past Medical History:  Diagnosis Date  . AAA (abdominal aortic aneurysm) ([place])    a.  Status post endovascular repair at [facility] in 2016  . Anxiety   . Asthma   .  Atrial flutter ([place])    a. diagnosed 7/19; b. CHADS2VASc => 5 (CHF, HTN, age x 1, DM, vascular disease); c. on Eliquis; d. s/p TEE/DCCV 8/19 with repeat DCCV 12/19  . Cancer (HCC)    melanoma L index finger   . Chronic diastolic CHF (congestive heart failure) ([place])   . COPD (chronic obstructive pulmonary disease) ([place])   . Coronary artery disease    a. LHC 5/10: left main 10%, mLAD-1 lesion 20%, mLAD-2 lesion 20%, D1 20%, mLCx-1 lesion 30%, mLCx-2 lesion 50%, OM1 20%, OM to 20%, LPL 20%, pRCA-1 lesion 20%, pRCA-2 lesion 20%.  EF 51% w/ mild inf HK  . Diabetes mellitus with complication ([place])   . Hyperlipidemia   . Hypertension   . Iliac artery aneurysm, bilateral ([place])   . Obstructive sleep apnea-hypopnea syndrome     Family History: Family History  Problem Relation Age of Onset  . Hypertension Mother   . Hyperlipidemia Mother   . Depression Mother   . Heart attack Father 2  . Heart disease Father   . Hypertension Father   . Hyperlipidemia Father     Social History: Social History   Socioeconomic History  . Marital status: Divorced    Spouse name: Not on file  . Number of children: 2  . Years of education: 10   . Highest education level: 10th grade  Occupational History  . Occupation: disabled   Tobacco Use  . Smoking status: Current Every Day Smoker    Packs/day: 0.25    Years: 45.00    Pack years: 11.25    Types: Cigarettes  . Smokeless tobacco: Former Systems developer    Types: Chew  . Tobacco comment: 3 to 4 cigarettes a day  Substance and Sexual Activity  . Alcohol use: Not Currently  . Drug use: No  . Sexual activity: Not on file  Other Topics Concern  . Not on file  Social History Narrative  . Not on file   Social Determinants of Health   Financial Resource Strain:   . Difficulty of Paying Living Expenses:   Food Insecurity: No Food Insecurity  . Worried About Charity fundraiser in the Last Year: Never true  . Ran Out of Food in the Last Year: Never true   Transportation Needs: No Transportation Needs  . Lack of Transportation (Medical): No  . Lack of Transportation (Non-Medical): No  Physical Activity:   . Days of Exercise per Week:   . Minutes of  Exercise per Session:   Stress:   . Feeling of Stress :   Social Connections:   . Frequency of Communication with Friends and Family:   . Frequency of Social Gatherings with Friends and Family:   . Attends Religious Services:   . Active Member of Clubs or Organizations:   . Attends Archivist Meetings:   Marland Kitchen Marital Status:   Intimate Partner Violence:   . Fear of Current or Ex-Partner:   . Emotionally Abused:   Marland Kitchen Physically Abused:   . Sexually Abused:     Vital Signs: Blood pressure (!) 109/55, pulse 76, temperature (!) 97.4 F (36.3 C), resp. rate 16, height 5' 10"  (1.778 m), weight 273 lb 12.8 oz (124.2 kg), SpO2 (!) 89 %.  Examination: General Appearance: The patient is well-developed, well-nourished, and in no distress. Skin: Gross inspection of skin unremarkable. Head: normocephalic, no gross deformities. Eyes: no gross deformities noted. ENT: ears appear grossly normal no exudates. Neck: Supple. No thyromegaly. No LAD. Respiratory: few rhonchi noted. Cardiovascular: Normal S1 and S2 without murmur or rub. Extremities: No cyanosis. pulses are equal. Neurologic: Alert and oriented. No involuntary movements.  LABS: Recent Results (from the past 2160 hour(s))  SARS CORONAVIRUS 2 (TAT 6-24 HRS) Nasopharyngeal Nasopharyngeal Swab     Status: None   Collection Time: 11/11/19 12:37 PM   Specimen: Nasopharyngeal Swab  Result Value Ref Range   SARS Coronavirus 2 NEGATIVE NEGATIVE    Comment: (NOTE) SARS-CoV-2 target nucleic acids are NOT DETECTED. The SARS-CoV-2 RNA is generally detectable in upper and lower respiratory specimens during the acute phase of infection. Negative results do not preclude SARS-CoV-2 infection, do not rule out co-infections with other  pathogens, and should not be used as the sole basis for treatment or other patient management decisions. Negative results must be combined with clinical observations, patient history, and epidemiological information. The expected result is Negative. Fact Sheet for Patients: SugarRoll.be Fact Sheet for Healthcare Providers: https://www.woods-mathews.com/ This test is not yet approved or cleared by the [place] FDA and  has been authorized for detection and/or diagnosis of SARS-CoV-2 by FDA under an Emergency Use Authorization (EUA). This EUA will remain  in effect (meaning this test can be used) for the duration of the COVID-19 declaration under Section 56 4(b)(1) of the Act, 21 U.S.C. section 360bbb-3(b)(1), unless the authorization is terminated or revoked sooner. Performed at [facility], [place] [address], [place], [place] [address]   POCT HgB A1C     Status: Abnormal   Collection Time: 01/19/20  2:08 PM  Result Value Ref Range   Hemoglobin A1C 5.8 (A) 4.0 - 5.6 %   HbA1c POC (<> result, manual entry)     HbA1c, POC (prediabetic range)     HbA1c, POC (controlled diabetic range)      Radiology: No results found.  No results found.  No results found.    Assessment and Plan: Patient Active Problem List   Diagnosis Date Noted  . Syncope and collapse 01/31/2020  . Other symptoms and signs involving the nervous system 01/31/2020  . Chronic obstructive pulmonary disease with acute exacerbation ([place]) 10/23/2019  . Fungal infection of toenail 10/23/2019  . Encounter for general adult medical examination with abnormal findings 08/21/2019  . Need for vaccination against Streptococcus pneumoniae 08/21/2019  . Flu vaccine need 07/14/2019  . Type 2 diabetes mellitus with hyperglycemia ([place]) 04/13/2019  . Hoarseness of voice 03/05/2019  . Acne vulgaris 12/05/2018  . Atrial  fibrillation ([place]) 05/02/2018  . Atrial flutter ([place])  04/09/2018  . Uncontrolled type 2 diabetes mellitus with hyperglycemia ([place]) 01/09/2018  . Nicotine dependence with current use 01/09/2018  . Diabetes mellitus without complication ([place]) 56/81/2751  . Myalgia 09/19/2017  . Vitamin B12 deficiency anemia 09/19/2017  . Common migraine with intractable migraine 09/19/2017  . Chronic hypoxemic respiratory failure ([place]) 09/19/2017  . Peripheral vascular disease, unspecified ([place]) 09/19/2017  . Dysuria 09/19/2017  . Osteoarthritis 09/19/2017  . Diverticulitis of intestine 09/19/2017  . Cervicalgia 09/19/2017  . Unspecified abdominal hernia without obstruction or gangrene 09/19/2017  . Vasomotor rhinitis 09/19/2017  . Occlusion and stenosis of bilateral carotid arteries 09/19/2017  . Generalized anxiety disorder 09/19/2017  . Malaise 09/19/2017  . Chronic bronchitis ([place]) 09/19/2017  . Obstructive sleep apnea (adult) (pediatric) 09/19/2017  . Cardiac arrhythmia 09/19/2017  . Smoker 04/02/2016  . Pain in the chest 08/20/2014  . SOB (shortness of breath) 08/20/2014  . Coronary artery disease involving native coronary artery of native heart with angina pectoris with documented spasm ([place]) 08/20/2014  . Emphysema of lung ([place]) 08/20/2014  . Coronary artery disease involving native coronary artery of native heart with unstable angina pectoris ([place]) 08/20/2014  . AAA (abdominal aortic aneurysm) without rupture ([place]) 08/20/2014  . Iliac artery aneurysm, bilateral ([place]) 08/20/2014  . Hyperlipidemia 08/20/2014  . Essential hypertension 08/20/2014  . Morbid obesity ([place]) 08/20/2014    1. COPD currently is on advair as well as spiriva. He needs to make certain he is using the medications as prescribed. Will add daliresp to his regimen. Also suggest follow up PFT he has had a significant decline in his FEV1 since the last assessment.  Ultimately I think part of his shortness of breath likely coming from the cardiac issues with tachycardia.  We did do a  6-minute walk on room he was hypoxic and I recommended that he go back on oxygen during the daytime prescription will be written for that to take care of that. 2. Dizziness scheduled for carotid doppler as part of the work up could be related to cardiac issue as well as the hypoxia we will put him on oxygen for now 3. A fib scheduled with his Cardiologist for follow up possible ablation according to the patient is what has been discussed with his primary cardiologist 4. OSA using his PAP device which he should continue to do so 5. Morbid obesity he has gained significant weight and again I recommended back to work on his diet and exercise.  Also if he does go on the Daliresp this might help him to lose some weight in fact  General Counseling: I have discussed the findings of the evaluation and examination with Avinash.  I have also discussed any further diagnostic evaluation thatmay be needed or ordered today. Benjamen verbalizes understanding of the findings of todays visit. We also reviewed his medications today and discussed drug interactions and side effects including but not limited excessive drowsiness and altered mental states. We also discussed that there is always a risk not just to him but also people around him. he has been encouraged to call the office with any questions or concerns that should arise related to todays visit.  Orders Placed This Encounter  Procedures  . Spirometry with Graph    Order Specific Question:   Where should this test be performed?    Answer:   [facility]  . 6 minute walk    Standing Status:   Future  Standing Expiration Date:   02/03/2021    Order Specific Question:   Where should this test be performed?    Answer:   other  . Pulmonary function test    Standing Status:   Future    Standing Expiration Date:   02/03/2021    Order Specific Question:   Where should this test be performed?    Answer:   [facility]     Time spent:  71mn  I have personally obtained a history, examined the patient, evaluated laboratory and imaging results, formulated the assessment and plan and placed orders.    SAllyne Gee MD FBehavior[facility] At RenaissancePulmonary and Critical Care Sleep medicine

## 2020-02-04 NOTE — Patient Instructions (Signed)
Roflumilast oral tablets What is this medicine? ROFLUMILAST (roe FLUE mi last) decreases inflammation in the lungs. This medicine is used to prevent COPD flare-ups. Do not use this medicine to treat sudden breathing problems. This medicine may be used for other purposes; ask your health care provider or pharmacist if you have questions. COMMON BRAND NAME(S): Daliresp What should I tell my health care provider before I take this medicine? They need to know if you have any of these conditions:  liver disease  mental illness  an unusual or allergic reaction to roflumilast, other medicines, foods, dyes, or preservatives  pregnant or trying to get pregnant  breast-feeding How should I use this medicine? Take this medicine by mouth with a glass of water. Follow the directions on the prescription label. You can take it with or without food. If it upsets your stomach, take it with food. Take your medicine at regular intervals. Do not take it more often than directed. Do not stop taking except on your doctor's advice. A special MedGuide will be given to you by the pharmacist with each prescription and refill. Be sure to read this information carefully each time. Talk to your pediatrician regarding the use of this medicine in children. Special care may be needed. Overdosage: If you think you have taken too much of this medicine contact a poison control center or emergency room at once. NOTE: This medicine is only for you. Do not share this medicine with others. What if I miss a dose? If you miss a dose, take it as soon as you can. If it is almost time for your next dose, take only that dose. Do not take double or extra doses. What may interact with this medicine?  carbamazepine  cimetidine  enoxacin  erythromycin  fluvoxamine  ketoconazole  phenobarbital  phenytoin  rifampicin  St. John's wort This list may not describe all possible interactions. Give your health care provider a  list of all the medicines, herbs, non-prescription drugs, or dietary supplements you use. Also tell them if you smoke, drink alcohol, or use illegal drugs. Some items may interact with your medicine. What should I watch for while using this medicine? Visit your doctor for regular check ups. Tell your doctor or healthcare professional if your symptoms do not start to get better or if they get worse. What side effects may I notice from receiving this medicine? Side effects that you should report to your doctor or health care professional as soon as possible:  allergic reactions like skin rash, itching or hives, swelling of the face, lips, or tongue  anxious  breathing problems  suicidal thoughts or other mood changes  trouble sleeping  weight loss Side effects that usually do not require medical attention (report to your doctor or health care professional if they continue or are bothersome):  back pain  diarrhea  dizziness  general ill feeling or flu-like symptoms  headache  loss of appetite  nausea, vomiting This list may not describe all possible side effects. Call your doctor for medical advice about side effects. You may report side effects to FDA at 1-800-FDA-1088. Where should I keep my medicine? Keep out of the reach of children. Store at room temperature between 15 and 30 degrees C (59 and 86 degrees F). Throw away any unused medicine after the expiration date. NOTE: This sheet is a summary. It may not cover all possible information. If you have questions about this medicine, talk to your doctor, pharmacist, or health care  provider.  2020 Elsevier/Gold Standard (2015-10-06 09:35:23) Chronic Obstructive Pulmonary Disease Chronic obstructive pulmonary disease (COPD) is a long-term (chronic) lung problem. When you have COPD, it is hard for air to get in and out of your lungs. Usually the condition gets worse over time, and your lungs will never return to normal. There are  things you can do to keep yourself as healthy as possible.  Your doctor may treat your condition with: ? Medicines. ? Oxygen. ? Lung surgery.  Your doctor may also recommend: ? Rehabilitation. This includes steps to make your body work better. It may involve a team of specialists. ? Quitting smoking, if you smoke. ? Exercise and changes to your diet. ? Comfort measures (palliative care). Follow these instructions at home: Medicines  Take over-the-counter and prescription medicines only as told by your doctor.  Talk to your doctor before taking any cough or allergy medicines. You may need to avoid medicines that cause your lungs to be dry. Lifestyle  If you smoke, stop. Smoking makes the problem worse. If you need help quitting, ask your doctor.  Avoid being around things that make your breathing worse. This may include smoke, chemicals, and fumes.  Stay active, but remember to rest as well.  Learn and use tips on how to relax.  Make sure you get enough sleep. Most adults need at least 7 hours of sleep every night.  Eat healthy foods. Eat smaller meals more often. Rest before meals. Controlled breathing Learn and use tips on how to control your breathing as told by your doctor. Try:  Breathing in (inhaling) through your nose for 1 second. Then, pucker your lips and breath out (exhale) through your lips for 2 seconds.  Putting one hand on your belly (abdomen). Breathe in slowly through your nose for 1 second. Your hand on your belly should move out. Pucker your lips and breathe out slowly through your lips. Your hand on your belly should move in as you breathe out.  Controlled coughing Learn and use controlled coughing to clear mucus from your lungs. Follow these steps: 1. Lean your head a little forward. 2. Breathe in deeply. 3. Try to hold your breath for 3 seconds. 4. Keep your mouth slightly open while coughing 2 times. 5. Spit any mucus out into a tissue. 6. Rest and  do the steps again 1 or 2 times as needed. General instructions  Make sure you get all the shots (vaccines) that your doctor recommends. Ask your doctor about a flu shot and a pneumonia shot.  Use oxygen therapy and pulmonary rehabilitation if told by your doctor. If you need home oxygen therapy, ask your doctor if you should buy a tool to measure your oxygen level (oximeter).  Make a COPD action plan with your doctor. This helps you to know what to do if you feel worse than usual.  Manage any other conditions you have as told by your doctor.  Avoid going outside when it is very hot, cold, or humid.  Avoid people who have a sickness you can catch (contagious).  Keep all follow-up visits as told by your doctor. This is important. Contact a doctor if:  You cough up more mucus than usual.  There is a change in the color or thickness of the mucus.  It is harder to breathe than usual.  Your breathing is faster than usual.  You have trouble sleeping.  You need to use your medicines more often than usual.  You have trouble  doing your normal activities such as getting dressed or walking around the house. Get help right away if:  You have shortness of breath while resting.  You have shortness of breath that stops you from: ? Being able to talk. ? Doing normal activities.  Your chest hurts for longer than 5 minutes.  Your skin color is more blue than usual.  Your pulse oximeter shows that you have low oxygen for longer than 5 minutes.  You have a fever.  You feel too tired to breathe normally. Summary  Chronic obstructive pulmonary disease (COPD) is a long-term lung problem.  The way your lungs work will never return to normal. Usually the condition gets worse over time. There are things you can do to keep yourself as healthy as possible.  Take over-the-counter and prescription medicines only as told by your doctor.  If you smoke, stop. Smoking makes the problem  worse. This information is not intended to replace advice given to you by your health care provider. Make sure you discuss any questions you have with your health care provider. Document Revised: 08/16/2017 Document Reviewed: 10/08/2016 Elsevier Patient Education  2020 [employer].

## 2020-02-05 ENCOUNTER — Ambulatory Visit: Payer: Medicare HMO

## 2020-02-05 ENCOUNTER — Telehealth: Payer: Self-pay

## 2020-02-05 DIAGNOSIS — R55 Syncope and collapse: Secondary | ICD-10-CM

## 2020-02-05 NOTE — Telephone Encounter (Signed)
Authorization has been approved for Daliresp 250 MCG tablet 31.00/31. Authorization is good until 09/16/2020 SL

## 2020-02-06 NOTE — Progress Notes (Signed)
Cardiology Office Note  Date:  02/08/2020   ID:  Lawrence Steele, DOB 11-13-1950, MRN [id number]  PCP:  Ronnell Freshwater, NP   Chief Complaint  Patient presents with  . other    2 month follow up. Meds reviewed by the pt. verbally. Pt. c/o shortness of breath more than usual.     HPI:  Lawrence Steele is a 69 year old gentleman with hx of   COPD,  coronary artery disease, catheterization 2000  50% left circumflex disease diabetes type 2, hemoglobin A1c 9.2 ETOH, vodka long history of smoking, continues to smoke diastolic CHF,  hypertension,  hyperlipidemia,  Morbid obesity Followed at [facility] for a moderately large abdominal aortic aneurysm, dilated iliac arteries. S/p EVAR Ascending aorta <4 cm cough syncope, none in several years  chronic oxygen at night, not on exertion who presents for follow-up of his coronary artery disease and atrial flutter  Dizzy, orthostasis 4 wks ago, had a fall Some balance issues, walks with a cane Working with PT, still wobbly  Has oxygen, uses it sometimes  On lasix 40 alternating with 20 daily Trace leg edema  Cardioversion on 04/24/2018 for atrial flutter  Multiple cardioversions in 2020, July 10/2019: cardioversion Fib/flutter  Labs reviewed HBA1C 5.8  EKG personally reviewed by myself on todays visit Shows NSR rate 63 bpm no ST or T wave change  Other past medical history reviewed  stress test February 2018 No significant ischemia, inferoapical fixed perfusion defect small size  ejection fraction 62% Cardiac catheterization was offered if symptoms got worse  previous falls, low back pain, neuropathy Had MRI lumbar spine with mild degeneration of disks,  Presented to chest pain August 14, 2017  Hospital records reviewed with the patient in detail Notes indicating that he was drinking heavy vodka Diagnosed with alcohol intoxication level was 383  Previous cardiac catheterization from 2010 reviewed with him showing diffuse mild  disease, 50% mid left circumflex, 20% disease in the other vessels   CT ABd 09/2015 at [facility]  completed AAA endovascular stent at [facility] reports -- Stable aortobiiliac endograft with decreased excluded aneurysm size. The previously identified type II endoleak is not well seen on today's study. -- Stable bilateral common iliac artery aneurysms. -- Stable anterior abdominal wall hernia containing nonobstructed small bowel, anterior bladder, and penile prosthesis reservoir.  He is seen by Dr. Humphrey Rolls , pulmonary, did 6 min walk, 94% Only oxygen at night, discussed with him in detail  Was on lipitor for years, just decided to stop this on his own, possibly having stomach issues with Lipitor   previously reported having problems on Crestor , myalgias Myalgias on simvastatin  long history of shortness of breath, on several inhalers  He has been seen at [facility] for a moderately large abdominal aortic aneurysm as well as dilated iliac arteries.   cardiac catheterization 01/18/2009.  Notes indicate he has proximal 20% RCA disease, 10% left main disease, 30 follow by 50% mid left circumflex disease, 20% OM1 disease, 20% OM 2 disease, 20% disease of a ramus branch, 20% mid LAD disease and 20% D1 disease. No intervention was performed at that time He does report a cardiac catheterization prior to that. No further cardiac catheterization or stress testing since 2010  Previously reported chest pain over the past several years. with arm pain, possible numbness in his arm  in the hospital 04/01/2014 for chest pain, shortness of breath. Chest x-ray showed scarring at the left base, lab work reviewed with him showing negative  cardiac enzymes, normal renal function, elevated alcohol level 0.28, BNP 270, EKG with no significant ST or T-wave changes  Records below were researched and found on care anywhere, reviewed with the patient today Echocardiogram done at [facility] 07/28/2014 showing normal LV systolic  function, dilated thoracic aorta 4.0 cm at the sinus of Valsalva, 3.6 cm above the sinotubular junction, LVH otherwise normal study  CT scan of the abdomen details a infrarenal abdominal aortic aneurysm arising at the level of the IMA measuring up to 4.8 x 4.3 cm, iliac artery aneurysm 2.6 on the right, 2.2 on the left  PMH:   has a past medical history of AAA (abdominal aortic aneurysm) ([place]), Anxiety, Asthma, Atrial flutter ([place]), Cancer ([place]), Chronic diastolic CHF (congestive heart failure) ([place]), COPD (chronic obstructive pulmonary disease) ([place]), Coronary artery disease, Diabetes mellitus with complication ([place]), Hyperlipidemia, Hypertension, Iliac artery aneurysm, bilateral ([place]), and Obstructive sleep apnea-hypopnea syndrome.  PSH:    Past Surgical History:  Procedure Laterality Date  . ABDOMINAL AORTIC ANEURYSM REPAIR  03/06/2015   [place]    . AMPUTATION Left    partial amputation  . APPENDECTOMY    . CARDIAC CATHETERIZATION  2010,03/16/2003,11/04/2002  . CARDIOVERSION N/A 04/24/2018   Procedure: CARDIOVERSION;  Surgeon: Nelva Bush, MD;  Location: [place] [facility];  Service: Cardiovascular;  Laterality: N/A;  . CARDIOVERSION N/A 09/05/2018   Procedure: CARDIOVERSION;  Surgeon: Minna Merritts, MD;  Location: [facility];  Service: Cardiovascular;  Laterality: N/A;  . CARDIOVERSION N/A 11/24/2018   Procedure: CARDIOVERSION (CATH LAB);  Surgeon: Wellington Hampshire, MD;  Location: [place] [facility];  Service: Cardiovascular;  Laterality: N/A;  . CARDIOVERSION N/A 03/18/2019   Procedure: CARDIOVERSION;  Surgeon: Minna Merritts, MD;  Location: [place] [facility];  Service: Cardiovascular;  Laterality: N/A;  . CARDIOVERSION N/A 11/13/2019   Procedure: CARDIOVERSION;  Surgeon: Minna Merritts, MD;  Location: [facility];  Service: Cardiovascular;  Laterality: N/A;  . CORONARY ANGIOPLASTY  03/16/2003   stent to the RCA  & 2/18/2004stent mid circumflex  . ELBOW SURGERY    . HERNIA REPAIR    .  NASAL SINUS SURGERY    . PENILE PROSTHESIS IMPLANT  1995  . PILONIDAL CYST EXCISION    . TEE WITHOUT CARDIOVERSION N/A 04/24/2018   Procedure: TRANSESOPHAGEAL ECHOCARDIOGRAM (TEE);  Surgeon: Nelva Bush, MD;  Location: [facility];  Service: Cardiovascular;  Laterality: N/A;  . TONSILECTOMY, ADENOIDECTOMY, BILATERAL MYRINGOTOMY AND TUBES    . UMBILICAL HERNIA REPAIR      Current Outpatient Medications  Medication Sig Dispense Refill  . Accu-Chek Softclix Lancets lancets Use as instructed  Once a daily E11.65 100 each 12  . acetaminophen (TYLENOL) 500 MG tablet Take 1,000 mg by mouth every 6 (six) hours as needed for headache.    . albuterol (PROAIR HFA) 108 (90 Base) MCG/ACT inhaler Inhale 2 puffs into the lungs every 6 (six) hours as needed. (Patient taking differently: Inhale 2 puffs into the lungs every 6 (six) hours as needed for wheezing. Ventolin) 3 Inhaler 3  . allopurinol (ZYLOPRIM) 100 MG tablet Take 1 tablet (100 mg total) by mouth 2 (two) times daily. 180 tablet 1  . atenolol (TENORMIN) 25 MG tablet Take 2 tablets (50 mg) in the morning and 1 tablet (25 mg) in the evening, (Patient taking differently: Take 25-50 mg by mouth 2 (two) times daily. Take 2 tablets (50 mg) in the morning and 1 tablet (25 mg) in the evening,) 270 tablet 3  .  atorvastatin (LIPITOR) 20 MG tablet Take 1 tablet by mouth once daily 90 tablet 0  . Blood Glucose Monitoring Suppl (ACCU-CHEK AVIVA PLUS) w/Device KIT Use as directed 1 kit 0  . calcium carbonate (TUMS - DOSED IN MG ELEMENTAL CALCIUM) 500 MG chewable tablet Chew 1-2 tablets by mouth 3 (three) times daily as needed for indigestion or heartburn.    . cyclobenzaprine (FLEXERIL) 10 MG tablet Take 1 tablet (10 mg total) by mouth 2 (two) times daily at 10 AM and 5 PM. 60 tablet 2  . diclofenac sodium (VOLTAREN) 1 % GEL Apply 1 application topically 4 (four) times daily as needed (pain).     Marland Kitchen doxycycline (VIBRA-TABS) 100 MG tablet Take 1 tablet (100 mg total)  by mouth daily. 30 tablet 3  . dronedarone (MULTAQ) 400 MG tablet Take 1 tablet (400 mg total) by mouth 2 (two) times daily with a meal. 60 tablet 6  . ELIQUIS 5 MG TABS tablet Take 1 tablet by mouth twice daily 60 tablet 6  . escitalopram (LEXAPRO) 5 MG tablet Take 1 tablet (5 mg total) by mouth daily. 30 tablet 3  . Fluticasone-Salmeterol (ADVAIR) 250-50 MCG/DOSE AEPB Inhale 1 puff into the lungs 2 (two) times daily. 60 each 4  . furosemide (LASIX) 20 MG tablet Take 2 tablets (40 mg) by mouth once every other day alternating with 1 tablet (20 mg) by mouth once every other day. 135 tablet 1  . gabapentin (NEURONTIN) 400 MG capsule Take 400 mg by mouth 4 (four) times daily.     Marland Kitchen glucose blood (ACCU-CHEK AVIVA PLUS) test strip Pt needs accu check Aviva plus lancets, test strips and meter to check blood sugars once daily 100 each 12  . glucose blood (ACCU-CHEK AVIVA PLUS) test strip 1 each by Other route as needed for other. Use as instructed 100 each 3  . hydroxypropyl methylcellulose / hypromellose (ISOPTO TEARS / GONIOVISC) 2.5 % ophthalmic solution Place 1 drop into both eyes 4 (four) times daily as needed (eye irritation (sand like feeling)).     Marland Kitchen ipratropium (ATROVENT) 0.02 % nebulizer solution Use every 6 hours for Shortness of breath and wheezing and as needed.  Prescription is for 90 days (Patient taking differently: Take 0.5 mg by nebulization every 6 (six) hours as needed for wheezing or shortness of breath. Use every 6 hours for Shortness of breath and wheezing and as needed.  Prescription is for 90 days) 900 mL 1  . isosorbide mononitrate (IMDUR) 30 MG 24 hr tablet Take 1 tablet (30 mg total) by mouth daily. 90 tablet 3  . loratadine (CLARITIN) 10 MG tablet Take 10 mg by mouth daily.    . meloxicam (MOBIC) 7.5 MG tablet Take 1 tablet (7.5 mg total) by mouth 2 (two) times daily. 60 tablet 3  . metFORMIN (GLUCOPHAGE) 500 MG tablet Take 1 tablet (500 mg total) by mouth 2 (two) times daily  with a meal. 180 tablet 1  . nitroGLYCERIN (NITROSTAT) 0.4 MG SL tablet Place 0.4 mg under the tongue every 5 (five) minutes x 3 doses as needed for chest pain.     Marland Kitchen nystatin (MYCOSTATIN) 100000 UNIT/ML suspension Take 5 mLs (500,000 Units total) by mouth 4 (four) times daily as needed (mouth/throat irritation.). 200 mL 1  . oxyCODONE-acetaminophen (PERCOCET) 10-325 MG tablet Take 1 tablet by mouth 4 (four) times daily.    . OXYGEN Inhale 2 L into the lungs continuous.     . Roflumilast (DALIRESP) 250 MCG  TABS Take 250 mcg by mouth daily. 31 tablet 4  . tiotropium (SPIRIVA) 18 MCG inhalation capsule Place 18 mcg into inhaler and inhale daily.    . traZODone (DESYREL) 100 MG tablet Take 1 tablet (100 mg total) by mouth at bedtime. 90 tablet 1  . umeclidinium bromide (INCRUSE ELLIPTA) 62.5 MCG/INH AEPB Inhale 1 puff into the lungs daily. 1 each 1  . gabapentin (NEURONTIN) 300 MG capsule      No current facility-administered medications for this visit.     Allergies:   Patient has no active allergies.   Social History:  The patient  reports that he has been smoking cigarettes. He has a 11.25 pack-year smoking history. He has quit using smokeless tobacco.  His smokeless tobacco use included chew. He reports previous alcohol use. He reports that he does not use drugs.   Family History:   family history includes Depression in his mother; Heart attack (age of onset: 52) in his father; Heart disease in his father; Hyperlipidemia in his father and mother; Hypertension in his father and mother.    Review of Systems: Review of Systems  Constitutional: Negative.   Respiratory: Positive for shortness of breath.   Cardiovascular: Negative.   Gastrointestinal: Negative.   Musculoskeletal: Negative.        Gait instability  Neurological: Negative.   Psychiatric/Behavioral: Negative.   All other systems reviewed and are negative.    PHYSICAL EXAM: VS:  BP 110/62 (BP Location: Left Arm, Patient  Position: Sitting, Cuff Size: Normal)   Pulse 63   Ht 5' 10"  (1.778 m)   Wt 270 lb 8 oz (122.7 kg)   SpO2 96%   BMI 38.81 kg/m  , BMI Body mass index is 38.81 kg/m. Constitutional:  oriented to person, place, and time. No distress.  HENT:  Head: Grossly normal Eyes:  no discharge. No scleral icterus.  Neck: No JVD, no carotid bruits  Cardiovascular: Regular rate and rhythm, no murmurs appreciated Pulmonary/Chest: Coarse breath sounds bilaterally with Rales Abdominal: Soft.  no distension.  no tenderness.  Musculoskeletal: Normal range of motion Neurological:  normal muscle tone. Coordination normal. No atrophy Skin: Skin warm and dry Psychiatric: normal affect, pleasant  Recent Labs: 10/02/2019: BUN 17; Creatinine, Ser 0.99; Hemoglobin 13.0; Platelets 168; Potassium 4.3; Sodium 138    Lipid Panel Lab Results  Component Value Date   CHOL 199 10/25/2016   HDL 56 10/25/2016   LDLCALC 120 (H) 10/25/2016   TRIG 114 10/25/2016    Wt Readings from Last 3 Encounters:  02/08/20 270 lb 8 oz (122.7 kg)  02/04/20 273 lb 12.8 oz (124.2 kg)  01/19/20 274 lb 6.4 oz (124.5 kg)     ASSESSMENT AND PLAN:  Coronary artery disease involving native coronary artery of native heart with angina pectoris with documented spasm (HCC) - Currently with no symptoms of angina. No further workup at this time. Continue current medication regimen. Cholesterol at goal, continues to smoke On Lipitor  Atrial flutter Will stay on atenolol, continue Eliquis Maintaining normal sinus rhythm, continue Multaq Was only taking half Multaq twice a day recommend he take full pill  Essential hypertension - Stay on atenolol Stop imdur, blood pressure running low, orthostasis symptoms, recent fall  Severe COPD, chronic respiratory distress/centrilobular emphysema Followed by pulmonary, on nebulizers inhalers  Morbid obesity (HCC) Chronic back pain, joint pain  A1c at goal Weight still running  high  Smoker Still smoking,  Underlying COPD No by pulmonary, on nebulizers and inhalers  Total encounter time more than 25 minutes  Greater than 50% was spent in counseling and coordination of care with the patient  Disposition:   F/U  6 months   Orders Placed This Encounter  Procedures  . EKG 12-Lead     Signed, Esmond Plants, M.D., Ph.D. 02/08/2020  [place], [place]

## 2020-02-08 ENCOUNTER — Encounter: Payer: Self-pay | Admitting: Cardiovascular Disease

## 2020-02-08 ENCOUNTER — Other Ambulatory Visit: Payer: Self-pay

## 2020-02-08 ENCOUNTER — Ambulatory Visit (INDEPENDENT_AMBULATORY_CARE_PROVIDER_SITE_OTHER): Payer: Medicare HMO | Admitting: Cardiovascular Disease

## 2020-02-08 VITALS — BP 110/62 | HR 63 | Ht 70.0 in | Wt 270.5 lb

## 2020-02-08 DIAGNOSIS — J449 Chronic obstructive pulmonary disease, unspecified: Secondary | ICD-10-CM

## 2020-02-08 DIAGNOSIS — I25118 Atherosclerotic heart disease of native coronary artery with other forms of angina pectoris: Secondary | ICD-10-CM | POA: Diagnosis not present

## 2020-02-08 DIAGNOSIS — I723 Aneurysm of iliac artery: Secondary | ICD-10-CM | POA: Diagnosis not present

## 2020-02-08 DIAGNOSIS — I714 Abdominal aortic aneurysm, without rupture, unspecified: Secondary | ICD-10-CM

## 2020-02-08 DIAGNOSIS — E785 Hyperlipidemia, unspecified: Secondary | ICD-10-CM | POA: Diagnosis not present

## 2020-02-08 DIAGNOSIS — I4892 Unspecified atrial flutter: Secondary | ICD-10-CM

## 2020-02-08 NOTE — Patient Instructions (Addendum)
Medication Instructions:  Stop the imdur If you continue to have dizzy spells, call the office We might need to cut back some on the lasix   Take full multaq twice a day  If you need a refill on your cardiac medications before your next appointment, please call your pharmacy.    Lab work: No new labs needed   If you have labs (blood work) drawn today and your tests are completely normal, you will receive your results only by: Marland Kitchen MyChart Message (if you have MyChart) OR . A paper copy in the mail If you have any lab test that is abnormal or we need to change your treatment, we will call you to review the results.   Testing/Procedures: No new testing needed   Follow-Up: At [facility], you and your health needs are our priority.  As part of our continuing mission to provide you with exceptional heart care, we have created designated Provider Care Teams.  These Care Teams include your primary Cardiologist (physician) and Advanced Practice Providers (APPs -  Physician Assistants and Nurse Practitioners) who all work together to provide you with the care you need, when you need it.  . You will need a follow up appointment in 6 months .  Marland Kitchen Providers on your designated Care Team:   . Murray Hodgkins, NP . Christell Faith, PA-C . Marrianne Mood, PA-C  Any Other Special Instructions Will Be Listed Below (If Applicable).  For educational health videos Log in to : [email or web address] Or : [email or web address], password : [email or web address]

## 2020-02-11 ENCOUNTER — Telehealth: Payer: Self-pay

## 2020-02-11 NOTE — Telephone Encounter (Signed)
Medical clearance for dental procedure signed and faxed back to [facility] @ [phone]. Copy placed in scan.

## 2020-02-17 ENCOUNTER — Telehealth: Payer: Self-pay | Admitting: Nurse Practitioner

## 2020-02-18 ENCOUNTER — Ambulatory Visit: Payer: Medicare HMO

## 2020-02-21 NOTE — Progress Notes (Signed)
Minimal plaque right with 50-60% stenosis. Mild to moderate plaque with <50% stenosis on left. Discuss with patient at next visit 03/29/2020.

## 2020-02-21 NOTE — Procedures (Signed)
[place], [place] [address]  DATE OF SERVICE: Feb 05, 2020  CAROTID DOPPLER INTERPRETATION:  Bilateral Carotid Ultrsasound and Color Doppler Examination was performed. The RIGHT CCA shows insignificant plaque in the vessel. The LEFT CCA shows mild to moderate plaque in the vessel. There was significant intimal thickening noted in the RIGHT carotid artery. There was no significant intimal thickening in the LEFT carotid artery.  The RIGHT CCA shows peak systolic velocity of 21.2 cm per second. The end diastolic velocity is 24.8 cm per second on the RIGHT side. The RIGHT ICA shows peak systolic velocity of 250.0 per second. RIGHT sided ICA end diastolic velocity is 37.0 cm per second. The RIGHT ECA shows a peak systolic velocity of 488.8 cm per second. The ICA/CCA ratio is calculated to be 1.7. This suggests 50 to 60% stenosis. The Vertebral Artery shows antegrade flow.  The LEFT CCA shows peak systolic velocity of 91.6 cm per second. The end diastolic velocity is 94.5 cm per second on the LEFT side. The LEFT ICA shows peak systolic velocity of 038.8 per second. LEFT sided ICA end diastolic velocity is 82.8 cm per second. The LEFT ECA shows a peak systolic velocity of 003.4 cm per second. The ICA/CCA ratio is calculated to be 1.38. This suggests less than 50% stenosis. The Vertebral Artery shows antegrade flow.   Impression:    The RIGHT CAROTID shows 50 to 60% stenosis. The LEFT CAROTID shows less than 50% stenosis.  There is mild to moderate plaque formation noted on the LEFT and insignificant plaque on the RIGHT  side. Consider a repeat Carotid doppler if clinical situation and symptoms warrant in 6-12 months. Patient should be encouraged to change lifestyles such as smoking cessation, regular exercise and dietary modification. Use of statins in the right clinical setting and ASA is encouraged.  [name], MD [facility] Pulmonary Critical Care Medicine

## 2020-03-02 ENCOUNTER — Other Ambulatory Visit: Payer: Self-pay | Admitting: Nurse Practitioner

## 2020-03-02 DIAGNOSIS — F40240 Claustrophobia: Secondary | ICD-10-CM

## 2020-03-02 MED ORDER — DIAZEPAM 5 MG PO TABS: ORAL_TABLET | ORAL | 1 refills | Status: DC

## 2020-03-02 MED ORDER — DIAZEPAM 5 MG PO TABS: ORAL_TABLET | ORAL | 0 refills | Status: DC

## 2020-03-02 NOTE — Progress Notes (Signed)
Prescription for diazepam sent ot [place]. Take 1 tablet po one time 1.5 hours prior to scheduled procedure. May repeat dose in 1 hour for persistent anxiety.

## 2020-03-02 NOTE — Telephone Encounter (Signed)
Prescription for diazepam sent ot [place]. Take 1 tablet po one time 1.5 hours prior to scheduled procedure. May repeat dose in 1 hour for persistent anxiety.

## 2020-03-07 ENCOUNTER — Telehealth: Payer: Self-pay | Admitting: Cardiovascular Disease

## 2020-03-07 NOTE — Telephone Encounter (Signed)
Pt c/o medication issue:  1. Name of Medication: eliquis   2. How are you currently taking this medication (dosage and times per day)? 5 mg po BID  3. Are you having a reaction (difficulty breathing--STAT)? no  4. What is your medication issue? Not affordable and wants to change to something he can afford

## 2020-03-08 ENCOUNTER — Telehealth: Payer: Self-pay

## 2020-03-08 NOTE — Telephone Encounter (Signed)
Portable oxygen form signed and faxed back to [facility] with requesting information. Placed in scan.

## 2020-03-08 NOTE — Telephone Encounter (Signed)
Spoke with patient and he reports that the Eliquis was $105 and he is just not able to afford that. He did have patient assistance but now is in the donut hole. He wants to know what other options are available. Advised that coumadin is sometimes option but it would be up to the provider. He states that he is not able to afford this current medication now that he is no longer able to get assistance. Advised that provider is out this week but that I would send him message and call him back once he responds. He verbalized understanding of our conversation, agreement with plan, and had no further questions at this time.

## 2020-03-09 ENCOUNTER — Other Ambulatory Visit: Payer: Self-pay

## 2020-03-09 NOTE — Patient Outreach (Addendum)
[facility]) Care Management  [place]  03/09/2020   Lawrence Steele [birth year] [id number]  Subjective: Incoming call from patient.  He reports that he is in the doughnut hole. He is needing help with several medications.  He states he did not pick up his Eliquis as he could not afford it.  Patient has received assistance before for his Eliquis, incruse ellipta, ventolin, and Advair.   Patient also needing assistance now for Multaq and daliresp.    Patient reports that he has done better with his diabetes.  Most recent A1c 5.8 and sugars running in the low 100's.  Patient reports some falls recently and that he has an MRI ordered for 03-24-20 due to falls.  He states he just has some dizzy spells and falls.  Most recent fall a few days ago after an appointment and states that he skinned his knees and fell on his face and broke his glasses.  Fall precautions discussed.  He states that his daughter is staying with him now to help.  Patient and daughter considering moving to a place that is better equipped for his needs and hers.  Discussed with patient paying closer attention to sugars as well being that this may also be reasons for his recent falls as well.  He verbalized understanding.     Objective:   Encounter Medications:  Outpatient Encounter Medications as of 03/09/2020  Medication Sig  . Accu-Chek Softclix Lancets lancets Use as instructed  Once a daily E11.65  . acetaminophen (TYLENOL) 500 MG tablet Take 1,000 mg by mouth every 6 (six) hours as needed for headache.  . albuterol (PROAIR HFA) 108 (90 Base) MCG/ACT inhaler Inhale 2 puffs into the lungs every 6 (six) hours as needed. (Patient taking differently: Inhale 2 puffs into the lungs every 6 (six) hours as needed for wheezing. Ventolin)  . allopurinol (ZYLOPRIM) 100 MG tablet Take 1 tablet (100 mg total) by mouth 2 (two) times daily.  Marland Kitchen atenolol (TENORMIN) 25 MG tablet Take 2 tablets (50 mg) in the morning and 1  tablet (25 mg) in the evening, (Patient taking differently: Take 25-50 mg by mouth 2 (two) times daily. Take 2 tablets (50 mg) in the morning and 1 tablet (25 mg) in the evening,)  . atorvastatin (LIPITOR) 20 MG tablet Take 1 tablet by mouth once daily  . Blood Glucose Monitoring Suppl (ACCU-CHEK AVIVA PLUS) w/Device KIT Use as directed  . calcium carbonate (TUMS - DOSED IN MG ELEMENTAL CALCIUM) 500 MG chewable tablet Chew 1-2 tablets by mouth 3 (three) times daily as needed for indigestion or heartburn.  . cyclobenzaprine (FLEXERIL) 10 MG tablet Take 1 tablet (10 mg total) by mouth 2 (two) times daily at 10 AM and 5 PM.  . diazepam (VALIUM) 5 MG tablet Take 1 tablet po one time 1.5 hours prior to scheduled procedure. May repeat dose in 1 hour for persistent anxiety.  . diclofenac sodium (VOLTAREN) 1 % GEL Apply 1 application topically 4 (four) times daily as needed (pain).   Marland Kitchen doxycycline (VIBRA-TABS) 100 MG tablet Take 1 tablet (100 mg total) by mouth daily.  Marland Kitchen dronedarone (MULTAQ) 400 MG tablet Take 1 tablet (400 mg total) by mouth 2 (two) times daily with a meal.  . ELIQUIS 5 MG TABS tablet Take 1 tablet by mouth twice daily  . escitalopram (LEXAPRO) 5 MG tablet Take 1 tablet (5 mg total) by mouth daily.  . Fluticasone-Salmeterol (ADVAIR) 250-50 MCG/DOSE AEPB Inhale 1 puff  into the lungs 2 (two) times daily.  . furosemide (LASIX) 20 MG tablet Take 2 tablets (40 mg) by mouth once every other day alternating with 1 tablet (20 mg) by mouth once every other day.  . gabapentin (NEURONTIN) 300 MG capsule   . gabapentin (NEURONTIN) 400 MG capsule Take 400 mg by mouth 4 (four) times daily.   Marland Kitchen glucose blood (ACCU-CHEK AVIVA PLUS) test strip Pt needs accu check Aviva plus lancets, test strips and meter to check blood sugars once daily  . glucose blood (ACCU-CHEK AVIVA PLUS) test strip 1 each by Other route as needed for other. Use as instructed  . hydroxypropyl methylcellulose / hypromellose (ISOPTO  TEARS / GONIOVISC) 2.5 % ophthalmic solution Place 1 drop into both eyes 4 (four) times daily as needed (eye irritation (sand like feeling)).   Marland Kitchen ipratropium (ATROVENT) 0.02 % nebulizer solution Use every 6 hours for Shortness of breath and wheezing and as needed.  Prescription is for 90 days (Patient taking differently: Take 0.5 mg by nebulization every 6 (six) hours as needed for wheezing or shortness of breath. Use every 6 hours for Shortness of breath and wheezing and as needed.  Prescription is for 90 days)  . loratadine (CLARITIN) 10 MG tablet Take 10 mg by mouth daily.  . meloxicam (MOBIC) 7.5 MG tablet Take 1 tablet (7.5 mg total) by mouth 2 (two) times daily.  . metFORMIN (GLUCOPHAGE) 500 MG tablet Take 1 tablet (500 mg total) by mouth 2 (two) times daily with a meal.  . nitroGLYCERIN (NITROSTAT) 0.4 MG SL tablet Place 0.4 mg under the tongue every 5 (five) minutes x 3 doses as needed for chest pain.   Marland Kitchen nystatin (MYCOSTATIN) 100000 UNIT/ML suspension Take 5 mLs (500,000 Units total) by mouth 4 (four) times daily as needed (mouth/throat irritation.).  Marland Kitchen oxyCODONE-acetaminophen (PERCOCET) 10-325 MG tablet Take 1 tablet by mouth 4 (four) times daily.  . OXYGEN Inhale 2 L into the lungs continuous.   . Roflumilast (DALIRESP) 250 MCG TABS Take 250 mcg by mouth daily.  Marland Kitchen tiotropium (SPIRIVA) 18 MCG inhalation capsule Place 18 mcg into inhaler and inhale daily.  . traZODone (DESYREL) 100 MG tablet Take 1 tablet (100 mg total) by mouth at bedtime.  Marland Kitchen umeclidinium bromide (INCRUSE ELLIPTA) 62.5 MCG/INH AEPB Inhale 1 puff into the lungs daily.   No facility-administered encounter medications on file as of 03/09/2020.    Functional Status:  In your present state of health, do you have any difficulty performing the following activities: 11/13/2019 10/08/2019  Hearing? N N  Vision? N N  Difficulty concentrating or making decisions? N N  Walking or climbing stairs? Y N  Comment - -  Dressing or  bathing? N N  Doing errands, shopping? - Y  Comment - family helps  Preparing Food and eating ? - N  Using the Toilet? - N  In the past six months, have you accidently leaked urine? - N  Do you have problems with loss of bowel control? - N  Managing your Medications? - N  Managing your Finances? - N  Comment - -  Housekeeping or managing your Housekeeping? - Y  Comment - family helps  Some recent data might be hidden    Fall/Depression Screening: Fall Risk  03/09/2020 01/19/2020 12/31/2019  Falls in the past year? 1 1 0  Comment - - -  Number falls in past yr: 1 0 -  Injury with Fall? 1 0 -  Comment - - -  Risk for fall due to : Medication side effect - -  Follow up - - -   PHQ 2/9 Scores 03/09/2020 01/19/2020 12/31/2019 10/23/2019 10/08/2019 08/21/2019 08/21/2019  PHQ - 2 Score 0 0 0 0 0 0 0  PHQ- 9 Score - - - - - - -    Assessment: Patient in doughnut hole and having problems affording his medications.  Patient with recent falls and MD has ordered MRI of the head to help explain recent falls.  Patient doing better with blood sugar control.    Plan:  [facility] CM Care Plan Problem One     Most Recent Value  Care Plan Problem One Knowledge Deficit in Self Management of Diabetes  Role Documenting the Problem One Care Management Telephonic Coordinator  Care Plan for Problem One Active  THN Long Term Goal  Patient will see a decrease in A1C in A1C from 6.9 within the next 90 days  THN Long Term Goal Start Date 03/09/20  Interventions for Problem One Long Term Goal Patient sugars ranging in low 100's.  Recent medication changes due to sugar levels.    THN CM Short Term Goal #1  Patient will not experience any falls within the next 30 days.    THN CM Short Term Goal #1 Start Date 03/09/20  Interventions for Short Term Goal #1 Discussed fall precautions and closer monitoring of sugars.  Aslo discussed actions for hypoglycemia. Patient scheduled for MRI of the head on 03-24-20.        RN CM will  refer to pharmacy for medication assistance.   RN CM will contact patient again in the month of September and patient agreeable.  Jone Baseman, RN, MSN [place] Management Care Management Coordinator Direct Line [phone] Cell [phone] Toll Free: [phone]  Fax: [phone]

## 2020-03-10 NOTE — Patient Outreach (Signed)
Referral from Jon Billings, RN to [facility] -  Priority: Routine Class: Clinic Performed  Comment:Patient in the doughnut hole again with eliquis, albuterol, multaq,       dialiresp, advair, incruse ellipta. Patient needing assistance  .       Thanks, [place]   Reason for Consult -> Medication Assistance

## 2020-03-10 NOTE — Telephone Encounter (Signed)
He will probably need to call his insurance company and find out the price of xarelto ,an alternative If that medication is also expensive we can transition him to warfarin Perhaps he can work with pharmacy

## 2020-03-11 ENCOUNTER — Other Ambulatory Visit: Payer: Self-pay | Admitting: Pharmacist

## 2020-03-11 DIAGNOSIS — J449 Chronic obstructive pulmonary disease, unspecified: Secondary | ICD-10-CM | POA: Diagnosis not present

## 2020-03-11 NOTE — Patient Outreach (Addendum)
[facility]) [place]   03/11/2020  Lawrence Steele 02-06-1951 [id number]  Reason for referral: medication assistance  Referral source: [facility] CM Referral medication(s):Multaq, Eliquis, Ventolin HFA, Dailresp, Advair, Incruse  Current insurance [employer]  HPI:  Patient is a 69 year old male with multiple medical conditions including but not limited to:  Afib, tyep 2 diabetes, CAD, diverticulitis, hypertension, diverticulitis, morbid obesity, PVD, and osteoarthritis. Spoke with patient and his daughter.  HIPAA identifiers were obtained.  Patient expressed concern affording Multaq, Ventolin HFA, Eliquis, Incruse, and Advair.  [place] has helped the patient get these medications through patient assistance programs for several years.   Objective: No Known Allergies  Medications Reviewed Today    Reviewed by Elayne Guerin, [facility] (Pharmacist) on 03/11/20 at 1642  Med List Status: <None>  Medication Order Taking? Sig Documenting Provider Last Dose Status Informant  Accu-Chek Softclix Lancets lancets [id number] Yes Use as instructed  Once a daily E11.65 Ronnell Freshwater, NP Taking Active Self  acetaminophen (TYLENOL) 500 MG tablet [id number] Yes Take 1,000 mg by mouth every 6 (six) hours as needed for headache. [provider] Taking Active Self  albuterol (PROAIR HFA) 108 (90 Base) MCG/ACT inhaler [id number] Yes Inhale 2 puffs into the lungs every 6 (six) hours as needed.  Patient taking differently: Inhale 2 puffs into the lungs every 6 (six) hours as needed for wheezing. Ventolin   Ronnell Freshwater, NP Taking Active Self  allopurinol (ZYLOPRIM) 100 MG tablet [id number] Yes Take 1 tablet (100 mg total) by mouth 2 (two) times daily. Ronnell Freshwater, NP Taking Active   atenolol (TENORMIN) 25 MG tablet [id number] Yes Take 2 tablets (50 mg) in the morning and 1 tablet (25 mg) in the evening,  Patient taking differently: Take 25-50 mg  by mouth 2 (two) times daily. Take 2 tablets (50 mg) in the morning and 1 tablet (25 mg) in the evening,   Visser, Jacquelyn D, PA-C Taking Active Self  atorvastatin (LIPITOR) 20 MG tablet [id number] Yes Take 1 tablet by mouth once daily Gollan, Kathlene November, MD Taking Active   Blood Glucose Monitoring Suppl (ACCU-CHEK AVIVA PLUS) w/Device KIT [id number] Yes Use as directed Ronnell Freshwater, NP Taking Active Self  cyclobenzaprine (FLEXERIL) 10 MG tablet [id number] Yes Take 1 tablet (10 mg total) by mouth 2 (two) times daily at 10 AM and 5 PM. Ronnell Freshwater, NP Taking Active Self  diazepam (VALIUM) 5 MG tablet [id number] Yes Take 1 tablet po one time 1.5 hours prior to scheduled procedure. May repeat dose in 1 hour for persistent anxiety. Ronnell Freshwater, NP Taking Active   diclofenac sodium (VOLTAREN) 1 % GEL [id number] Yes Apply 1 application topically 4 (four) times daily as needed (pain).  [provider] Taking Active Self  doxycycline (VIBRA-TABS) 100 MG tablet [id number] Yes Take 1 tablet (100 mg total) by mouth daily. Ronnell Freshwater, NP Taking Active   dronedarone (MULTAQ) 400 MG tablet [id number] Yes Take 1 tablet (400 mg total) by mouth 2 (two) times daily with a meal. Deboraha Sprang, MD Taking Active Self  ELIQUIS 5 MG TABS tablet [id number] No Take 1 tablet by mouth twice daily  Patient not taking: Reported on 03/11/2020   Minna Merritts, MD Not Taking Active            Med Note Elayne Guerin   Fri Mar 11, 2020  4:37 PM) Ran out due  to cost  escitalopram (LEXAPRO) 5 MG tablet [id number] No Take 1 tablet (5 mg total) by mouth daily.  Patient not taking: Reported on 03/11/2020   Ronnell Freshwater, NP Not Taking Active Self  Fluticasone-Salmeterol (ADVAIR) 250-50 MCG/DOSE AEPB [id number] Yes Inhale 1 puff into the lungs 2 (two) times daily. Kendell Bane, NP Taking Active Self  furosemide (LASIX) 20 MG tablet [id number] Yes Take 2 tablets (40 mg) by mouth once every other day  alternating with 1 tablet (20 mg) by mouth once every other day. Rise Mu, PA-C Taking Active Self  gabapentin (NEURONTIN) 400 MG capsule [id number] Yes Take 400 mg by mouth 4 (four) times daily.  [provider] Taking Active Self           Med Note (ADEDOYIN, GLORIA   Wed Sep 03, 2018  6:17 PM)    glucose blood (ACCU-CHEK AVIVA PLUS) test strip [id number] Yes Pt needs accu check Aviva plus lancets, test strips and meter to check blood sugars once daily Ronnell Freshwater, NP Taking Active Self  glucose blood (ACCU-CHEK AVIVA PLUS) test strip [id number] Yes 1 each by Other route as needed for other. Use as instructed Ronnell Freshwater, NP Taking Active Self  hydroxypropyl methylcellulose / hypromellose (ISOPTO TEARS / GONIOVISC) 2.5 % ophthalmic solution [id number]  Place 1 drop into both eyes 4 (four) times daily as needed (eye irritation (sand like feeling)).  [provider]  Active Self  ipratropium (ATROVENT) 0.02 % nebulizer solution [id number] Yes Use every 6 hours for Shortness of breath and wheezing and as needed.  Prescription is for 90 days  Patient taking differently: Take 0.5 mg by nebulization every 6 (six) hours as needed for wheezing or shortness of breath. Use every 6 hours for Shortness of breath and wheezing and as needed.  Prescription is for 90 days   Kendell Bane, NP Taking Active Self  loratadine (CLARITIN) 10 MG tablet [id number] Yes Take 10 mg by mouth daily. [provider] Taking Active Self  meloxicam (MOBIC) 7.5 MG tablet [id number] Yes Take 1 tablet (7.5 mg total) by mouth 2 (two) times daily. Ronnell Freshwater, NP Taking Active Self  metFORMIN (GLUCOPHAGE) 500 MG tablet [id number] Yes Take 1 tablet (500 mg total) by mouth 2 (two) times daily with a meal. Boscia, Heather E, NP Taking Active   nitroGLYCERIN (NITROSTAT) 0.4 MG SL tablet [id number] Yes Place 0.4 mg under the tongue every 5 (five) minutes x 3 doses as needed for chest pain.  [provider] Taking Active Self           Med Note Iva Lento, Louann Liv Feb 16, 2019  2:53 PM)    nystatin (MYCOSTATIN) 100000 UNIT/ML suspension [id number] Yes Take 5 mLs (500,000 Units total) by mouth 4 (four) times daily as needed (mouth/throat irritation.). Ronnell Freshwater, NP Taking Active Self  oxyCODONE-acetaminophen (PERCOCET) 10-325 MG tablet [id number] Yes Take 1 tablet by mouth 4 (four) times daily. [provider] Taking Active Self  OXYGEN [id number] Yes Inhale 2 L into the lungs continuous.  [provider] Taking Active Self  Roflumilast (DALIRESP) 250 MCG TABS [id number] Yes Take 250 mcg by mouth daily. [name], MD Taking Active   tiotropium [facility]) 18 MCG inhalation capsule [id number] Yes Place 18 mcg into inhaler and inhale daily.  [provider] Taking Active Self           Med Note Luciana Axe, Angelia Mould  Fri Mar 11, 2020  4:42 PM) Uses interchangeably with Spiriva  traZODone (DESYREL) 100 MG tablet [id number] Yes Take 1 tablet (100 mg total) by mouth at bedtime. Ronnell Freshwater, NP Taking Active   umeclidinium bromide (INCRUSE ELLIPTA) 62.5 MCG/INH AEPB [id number] Yes Inhale 1 puff into the lungs daily. Ronnell Freshwater, NP Taking Active Self          Assessment:  Medication Assistance Findings:  Medication assistance needs identified: Ventolin, Advair, Dailresp, Incruse-[employer]   Multaq-[employer] [place]  Since he has been on patient assistance before, patient was familiar with the process.   He communicated understanding about providing proof of income along with a pharmacy print out.  Patient is not eligible for LIS/Extra Help based on his reported household income   Additional medication assistance options reviewed with patient as warranted:  No other options identified   Adherence Due to the cost, patient reported being out of Eliquis (last day of therapy was > 1 week ago).  He also said that he  would be running out of Multaq soon.    Patient's daughter said she would contact the patient's provider about the potential of samples.   The patient assistance process takes about 4-6 weeks minimum to complete.  Plan: I will route patient assistance letter to [place] technician who will coordinate patient assistance program application process for medications listed above.  [facility] pharmacy technician will assist with obtaining all required documents from both patient and provider(s) and submit application(s) once completed.    Will route note to patient's PCP to let her know about the adherence issues.  [place] Technician will follow the patient through the patient assistance process.  Elayne Guerin, PharmD, [place] Clinical Pharmacist [phone]

## 2020-03-14 ENCOUNTER — Ambulatory Visit: Payer: Medicare HMO | Admitting: Nurse Practitioner

## 2020-03-14 ENCOUNTER — Other Ambulatory Visit: Payer: Self-pay | Admitting: Pharmacy Technician

## 2020-03-14 NOTE — Patient Outreach (Signed)
[facility]) Care Management  03/14/2020  Lawrence Steele 10/02/1950 [id number]                                      Medication Assistance Referral  Referral From: [name]  Medication/Company: [name] Patient application portion:  Education officer, museum portion: Faxed  to Dr. [name] Provider address/fax verified via: Office website  Medication/Company: Eliquis / [employer] Patient application portion:  Mailed Provider application portion: Faxed  to Dr. [name] Provider address/fax verified via: Office website  Medication/Company: [name], [name] Patient application portion:  Mailed Provider application portion: Faxed  to [name], NP Provider address/fax verified via: Office website  Medication/Company: [name] Patient application portion:  Mailed Provider application portion: Faxed  to [name], NP Provider address/fax verified via: Office website   Follow up:  Will follow up with patient in 5-10 business days to confirm application(s) have been received.  [name], [place]  [phone]

## 2020-03-15 NOTE — Telephone Encounter (Signed)
Left voicemail message to call back  

## 2020-03-16 NOTE — Telephone Encounter (Signed)
Spoke with patient and he has someone at [facility] who is helping him get patient assistance. Reviewed provider recommendations and he verbalized understanding and states that he is still getting some for now and if he should have any problems then he would give [place] a call. Expressed the importance of not missing any doses and to please call before he runs out if not able to get any medication. He verbalized understanding of our conversation and acknowledged that he would let us know. He was appreciative for the call back with no further questions at this time.

## 2020-03-17 ENCOUNTER — Other Ambulatory Visit: Payer: Self-pay | Admitting: *Deleted

## 2020-03-17 MED ORDER — APIXABAN 5 MG PO TABS: 5.0000 mg | ORAL_TABLET | Freq: Two times a day (BID) | ORAL | 3 refills | Status: AC

## 2020-03-17 MED ORDER — DRONEDARONE HCL 400 MG PO TABS: 400.0000 mg | ORAL_TABLET | Freq: Two times a day (BID) | ORAL | 3 refills | Status: DC

## 2020-03-23 ENCOUNTER — Telehealth: Payer: Self-pay | Admitting: *Deleted

## 2020-03-23 NOTE — Telephone Encounter (Signed)
Late entry: - Physician portion & RX's for Eliquis & Multaq patient assistance faxed to [facility] Sharee Pimple [place], CPhT) on 03/18/20 by Meyer Cory, RN in my absence.  - faxed to [phone] - confirmation received

## 2020-03-24 ENCOUNTER — Ambulatory Visit
Admission: RE | Admit: 2020-03-24 | Discharge: 2020-03-24 | Disposition: A | Discharge status: Home / Self Care | Payer: Medicare HMO | Source: Ambulatory Visit | Attending: Nurse Practitioner | Admitting: Nurse Practitioner

## 2020-03-24 DIAGNOSIS — R6889 Other general symptoms and signs: Secondary | ICD-10-CM | POA: Diagnosis not present

## 2020-03-24 DIAGNOSIS — R29818 Other symptoms and signs involving the nervous system: Secondary | ICD-10-CM

## 2020-03-24 DIAGNOSIS — I6782 Cerebral ischemia: Secondary | ICD-10-CM | POA: Diagnosis not present

## 2020-03-24 DIAGNOSIS — G319 Degenerative disease of nervous system, unspecified: Secondary | ICD-10-CM | POA: Diagnosis not present

## 2020-03-24 DIAGNOSIS — G9389 Other specified disorders of brain: Secondary | ICD-10-CM | POA: Diagnosis not present

## 2020-03-24 DIAGNOSIS — I639 Cerebral infarction, unspecified: Secondary | ICD-10-CM | POA: Diagnosis not present

## 2020-03-24 MED ORDER — GADOBENATE DIMEGLUMINE 529 MG/ML IV SOLN
20.0000 mL | Freq: Once | INTRAVENOUS | Status: AC | PRN
  Administered 2020-03-24: 20 mL via INTRAVENOUS

## 2020-03-25 ENCOUNTER — Telehealth: Payer: Self-pay

## 2020-03-25 ENCOUNTER — Other Ambulatory Visit: Payer: Self-pay | Admitting: Pharmacy Technician

## 2020-03-25 ENCOUNTER — Ambulatory Visit: Payer: Self-pay

## 2020-03-25 NOTE — Patient Outreach (Signed)
[facility]) Care Management  03/25/2020  Lawrence Steele Aug 04, 1951 [id number]  Received both patient and provider portion(s) of patient assistance application(s) for Multaq & Eliquis. Faxed completed application and required documents into [employer] and [employer] respectively.  Also refaxed [place] as we are in need of the provider portions of the [place] application for Daliresp and [place] application for [place].  Will follow up with company(ies) in 5-7 business days to check status of application(s) and to follow up with provider on the other 2 applications.  Doneta Bayman P. Riddick Nuon, [place]  [phone]

## 2020-03-25 NOTE — Telephone Encounter (Signed)
Confirmed and screened for 03-29-20 ov.

## 2020-03-25 NOTE — Telephone Encounter (Signed)
Thank you. I see him back on 03/29/2020 and I will review with him. Will refer to neurology.

## 2020-03-25 NOTE — Patient Outreach (Signed)
[place] [facility]) Care Management  03/25/2020  Lawrence Steele 1951/04/29 [id number]  ADDENDUM  Received both patient and provider portion(s) of patient assistance application(s) for Advair, Incruse and Daliresp. Faxed completed application and required documents into [employer] and [employer] respectively.  Will follow up with company(ies) in 5-10 business days to check status of application(s).  Eila Runyan P. Aurther Harlin, [place]  [phone]

## 2020-03-29 ENCOUNTER — Other Ambulatory Visit: Payer: Self-pay

## 2020-03-29 ENCOUNTER — Ambulatory Visit (INDEPENDENT_AMBULATORY_CARE_PROVIDER_SITE_OTHER): Payer: Medicare HMO | Admitting: Nurse Practitioner

## 2020-03-29 ENCOUNTER — Encounter: Payer: Self-pay | Admitting: Nurse Practitioner

## 2020-03-29 VITALS — BP 101/62 | HR 81 | Temp 97.5°F | Resp 16 | Ht 70.0 in | Wt 255.4 lb

## 2020-03-29 DIAGNOSIS — I4891 Unspecified atrial fibrillation: Secondary | ICD-10-CM | POA: Diagnosis not present

## 2020-03-29 DIAGNOSIS — J441 Chronic obstructive pulmonary disease with (acute) exacerbation: Secondary | ICD-10-CM

## 2020-03-29 DIAGNOSIS — E1165 Type 2 diabetes mellitus with hyperglycemia: Secondary | ICD-10-CM | POA: Diagnosis not present

## 2020-03-29 DIAGNOSIS — I6381 Other cerebral infarction due to occlusion or stenosis of small artery: Secondary | ICD-10-CM

## 2020-03-29 DIAGNOSIS — R55 Syncope and collapse: Secondary | ICD-10-CM

## 2020-03-29 NOTE — Progress Notes (Signed)
[facility] [place], [place] [address]  Internal MEDICINE  Office Visit Note  Patient Name: Lawrence Steele  [id number]  [id number]  Date of Service: 04/06/2020  Chief Complaint  Patient presents with  . Follow-up    Review MRI results  . Quality Metric Gaps    TDAP    The patient is here for routine follow up. He states that he has been having some trouble with dizzy spells. Getting so bad, he is falling frequently. Patient has mentioned this to his cardiologist. May be related to some of the medication he takes for a-fib and CAD. Continues to have these dizzy spells, sometimes, he blacks out completely for several minutes which have caused him to fall and injure himself. Since his last visit, he had carotid doppler. The RIGHT CAROTID shows 50 to 60% stenosis. The LEFT CAROTID shows less than 50% stenosis. There is mild to moderate plaque formation noted on the LEFT and insignificant plaque on the RIGHT side. He also had MRI of the brain. This was positive for T1 hyperintensity in the bilateral globus pallidus correlating with cirrhosis seen by prior CTs. Mild cortical atrophy. There is subcentimeter focus of restricted diffusion in the left temporal white matter without enhancement. Few remote white matter insults, typical for age. The patient also reports reduced appetite. Has lost about 18 pounds over the past two months.       Current Medication: Outpatient Encounter Medications as of 03/29/2020  Medication Sig Note  . Accu-Chek Softclix Lancets lancets Use as instructed  Once a daily E11.65   . acetaminophen (TYLENOL) 500 MG tablet Take 1,000 mg by mouth every 6 (six) hours as needed for headache.   . albuterol (PROAIR HFA) 108 (90 Base) MCG/ACT inhaler Inhale 2 puffs into the lungs every 6 (six) hours as needed. (Patient taking differently: Inhale 2 puffs into the lungs every 6 (six) hours as needed for wheezing. Ventolin)   . allopurinol (ZYLOPRIM) 100  MG tablet Take 1 tablet (100 mg total) by mouth 2 (two) times daily.   Marland Kitchen apixaban (ELIQUIS) 5 MG TABS tablet Take 1 tablet (5 mg total) by mouth 2 (two) times daily.   Marland Kitchen atenolol (TENORMIN) 25 MG tablet Take 2 tablets (50 mg) in the morning and 1 tablet (25 mg) in the evening, (Patient taking differently: Take 25-50 mg by mouth 2 (two) times daily. Take 2 tablets (50 mg) in the morning and 1 tablet (25 mg) in the evening,)   . Blood Glucose Monitoring Suppl (ACCU-CHEK AVIVA PLUS) w/Device KIT Use as directed   . diazepam (VALIUM) 5 MG tablet Take 1 tablet po one time 1.5 hours prior to scheduled procedure. May repeat dose in 1 hour for persistent anxiety.   . diclofenac sodium (VOLTAREN) 1 % GEL Apply 1 application topically 4 (four) times daily as needed (pain).    Marland Kitchen dronedarone (MULTAQ) 400 MG tablet Take 1 tablet (400 mg total) by mouth 2 (two) times daily with a meal.   . escitalopram (LEXAPRO) 5 MG tablet Take 1 tablet (5 mg total) by mouth daily.   . Fluticasone-Salmeterol (ADVAIR) 250-50 MCG/DOSE AEPB Inhale 1 puff into the lungs 2 (two) times daily.   Marland Kitchen gabapentin (NEURONTIN) 400 MG capsule Take 400 mg by mouth 4 (four) times daily.    Marland Kitchen glucose blood (ACCU-CHEK AVIVA PLUS) test strip Pt needs accu check Aviva plus lancets, test strips and meter to check blood sugars once daily   . glucose  blood (ACCU-CHEK AVIVA PLUS) test strip 1 each by Other route as needed for other. Use as instructed   . hydroxypropyl methylcellulose / hypromellose (ISOPTO TEARS / GONIOVISC) 2.5 % ophthalmic solution Place 1 drop into both eyes 4 (four) times daily as needed (eye irritation (sand like feeling)).    Marland Kitchen ipratropium (ATROVENT) 0.02 % nebulizer solution Use every 6 hours for Shortness of breath and wheezing and as needed.  Prescription is for 90 days (Patient taking differently: Take 0.5 mg by nebulization every 6 (six) hours as needed for wheezing or shortness of breath. Use every 6 hours for Shortness of  breath and wheezing and as needed.  Prescription is for 90 days)   . loratadine (CLARITIN) 10 MG tablet Take 10 mg by mouth daily.   . meloxicam (MOBIC) 7.5 MG tablet Take 1 tablet (7.5 mg total) by mouth 2 (two) times daily.   . metFORMIN (GLUCOPHAGE) 500 MG tablet Take 1 tablet (500 mg total) by mouth 2 (two) times daily with a meal.   . nitroGLYCERIN (NITROSTAT) 0.4 MG SL tablet Place 0.4 mg under the tongue every 5 (five) minutes x 3 doses as needed for chest pain.    Marland Kitchen nystatin (MYCOSTATIN) 100000 UNIT/ML suspension Take 5 mLs (500,000 Units total) by mouth 4 (four) times daily as needed (mouth/throat irritation.).   Marland Kitchen oxyCODONE-acetaminophen (PERCOCET) 10-325 MG tablet Take 1 tablet by mouth 4 (four) times daily.   . OXYGEN Inhale 2 L into the lungs continuous.    . Roflumilast (DALIRESP) 250 MCG TABS Take 250 mcg by mouth daily.   Marland Kitchen tiotropium (SPIRIVA) 18 MCG inhalation capsule Place 18 mcg into inhaler and inhale daily.  03/11/2020: Uses interchangeably with Spiriva  . traZODone (DESYREL) 100 MG tablet Take 1 tablet (100 mg total) by mouth at bedtime.   Marland Kitchen umeclidinium bromide (INCRUSE ELLIPTA) 62.5 MCG/INH AEPB Inhale 1 puff into the lungs daily.   . [DISCONTINUED] atorvastatin (LIPITOR) 20 MG tablet Take 1 tablet by mouth once daily   . [DISCONTINUED] cyclobenzaprine (FLEXERIL) 10 MG tablet Take 1 tablet (10 mg total) by mouth 2 (two) times daily at 10 AM and 5 PM.   . [DISCONTINUED] doxycycline (VIBRA-TABS) 100 MG tablet Take 1 tablet (100 mg total) by mouth daily.   . [DISCONTINUED] furosemide (LASIX) 20 MG tablet Take 2 tablets (40 mg) by mouth once every other day alternating with 1 tablet (20 mg) by mouth once every other day.    No facility-administered encounter medications on file as of 03/29/2020.    Surgical History: Past Surgical History:  Procedure Laterality Date  . ABDOMINAL AORTIC ANEURYSM REPAIR  03/06/2015   [place]    . AMPUTATION Left     partial amputation  . APPENDECTOMY    . CARDIAC CATHETERIZATION  2010,03/16/2003,11/04/2002  . CARDIOVERSION N/A 04/24/2018   Procedure: CARDIOVERSION;  Surgeon: Nelva Bush, MD;  Location: [facility];  Service: Cardiovascular;  Laterality: N/A;  . CARDIOVERSION N/A 09/05/2018   Procedure: CARDIOVERSION;  Surgeon: Minna Merritts, MD;  Location: [facility];  Service: Cardiovascular;  Laterality: N/A;  . CARDIOVERSION N/A 11/24/2018   Procedure: CARDIOVERSION (CATH LAB);  Surgeon: Wellington Hampshire, MD;  Location: [facility];  Service: Cardiovascular;  Laterality: N/A;  . CARDIOVERSION N/A 03/18/2019   Procedure: CARDIOVERSION;  Surgeon: Minna Merritts, MD;  Location: [facility];  Service: Cardiovascular;  Laterality: N/A;  . CARDIOVERSION N/A 11/13/2019   Procedure: CARDIOVERSION;  Surgeon: Minna Merritts, MD;  Location: [facility];  Service: Cardiovascular;  Laterality: N/A;  . CORONARY ANGIOPLASTY  03/16/2003   stent to the RCA  & 2/18/2004stent mid circumflex  . ELBOW SURGERY    . HERNIA REPAIR    . NASAL SINUS SURGERY    . PENILE PROSTHESIS IMPLANT  1995  . PILONIDAL CYST EXCISION    . TEE WITHOUT CARDIOVERSION N/A 04/24/2018   Procedure: TRANSESOPHAGEAL ECHOCARDIOGRAM (TEE);  Surgeon: Nelva Bush, MD;  Location: [facility];  Service: Cardiovascular;  Laterality: N/A;  . TONSILECTOMY, ADENOIDECTOMY, BILATERAL MYRINGOTOMY AND TUBES    . UMBILICAL HERNIA REPAIR      Medical History: Past Medical History:  Diagnosis Date  . AAA (abdominal aortic aneurysm) ([place])    a.  Status post endovascular repair at [facility] in 2016  . Anxiety   . Asthma   . Atrial flutter ([place])    a. diagnosed 7/19; b. CHADS2VASc => 5 (CHF, HTN, age x 1, DM, vascular disease); c. on Eliquis; d. s/p TEE/DCCV 8/19 with repeat DCCV 12/19  . Cancer (HCC)    melanoma L index finger   . Chronic diastolic CHF (congestive heart failure) ([place])   . COPD (chronic obstructive pulmonary disease) ([place])   . Coronary artery disease     a. LHC 5/10: left main 10%, mLAD-1 lesion 20%, mLAD-2 lesion 20%, D1 20%, mLCx-1 lesion 30%, mLCx-2 lesion 50%, OM1 20%, OM to 20%, LPL 20%, pRCA-1 lesion 20%, pRCA-2 lesion 20%.  EF 51% w/ mild inf HK  . Diabetes mellitus with complication ([place])   . Hyperlipidemia   . Hypertension   . Iliac artery aneurysm, bilateral ([place])   . Obstructive sleep apnea-hypopnea syndrome     Family History: Family History  Problem Relation Age of Onset  . Hypertension Mother   . Hyperlipidemia Mother   . Depression Mother   . Heart attack Father 60  . Heart disease Father   . Hypertension Father   . Hyperlipidemia Father     Social History   Socioeconomic History  . Marital status: Divorced    Spouse name: Not on file  . Number of children: 2  . Years of education: 10   . Highest education level: 10th grade  Occupational History  . Occupation: disabled   Tobacco Use  . Smoking status: Current Every Day Smoker    Packs/day: 0.25    Years: 45.00    Pack years: 11.25    Types: Cigarettes  . Smokeless tobacco: Former Systems developer    Types: Chew  . Tobacco comment: 3 to 4 cigarettes a day  Vaping Use  . Vaping Use: Never used  Substance and Sexual Activity  . Alcohol use: Not Currently  . Drug use: No  . Sexual activity: Not on file  Other Topics Concern  . Not on file  Social History Narrative  . Not on file   Social Determinants of Health   Financial Resource Strain:   . Difficulty of Paying Living Expenses:   Food Insecurity: No Food Insecurity  . Worried About Charity fundraiser in the Last Year: Never true  . Ran Out of Food in the Last Year: Never true  Transportation Needs: No Transportation Needs  . Lack of Transportation (Medical): No  . Lack of Transportation (Non-Medical): No  Physical Activity:   . Days of Exercise per Week:   . Minutes of Exercise per Session:   Stress:   . Feeling of Stress :   Social Connections:   . Frequency  of Communication with Friends and  Family:   . Frequency of Social Gatherings with Friends and Family:   . Attends Religious Services:   . Active Member of Clubs or Organizations:   . Attends Archivist Meetings:   Marland Kitchen Marital Status:   Intimate Partner Violence:   . Fear of Current or Ex-Partner:   . Emotionally Abused:   Marland Kitchen Physically Abused:   . Sexually Abused:       Review of Systems  Constitutional: Positive for activity change and fatigue. Negative for unexpected weight change.  HENT: Negative for postnasal drip, rhinorrhea, sore throat and voice change.   Respiratory: Positive for cough, shortness of breath and wheezing.        Intermittent wheezing.  Cardiovascular: Positive for palpitations. Negative for chest pain.       Seeing cardiology for chronic a-fib.  Blood pressure very low today. Patient feeling dizzy.   Gastrointestinal: Negative for constipation, diarrhea, nausea and vomiting.  Endocrine: Negative for cold intolerance, heat intolerance, polydipsia and polyuria.       Blood sugars much lower than prior checks. HgbA1c 5.8 today, down from 7.2 at his last check.   Musculoskeletal: Positive for arthralgias, back pain and myalgias.  Skin: Negative for rash.  Allergic/Immunologic: Positive for environmental allergies.  Neurological: Positive for dizziness and headaches. Negative for seizures.  Hematological: Negative for adenopathy.  Psychiatric/Behavioral: Positive for dysphoric mood. The patient is nervous/anxious.     Today's Vitals   03/29/20 1057  BP: 101/62  Pulse: 81  Resp: 16  Temp: (!) 97.5 F (36.4 C)  SpO2: 94%  Weight: 255 lb 6.4 oz (115.8 kg)  Height: 5' 10"  (1.778 m)   Body mass index is 36.65 kg/m.  Physical Exam Vitals and nursing note reviewed.  Constitutional:      Appearance: Normal appearance. He is well-developed. He is ill-appearing.  HENT:     Head: Normocephalic.     Nose: Nose normal.  Eyes:     Conjunctiva/sclera: Conjunctivae normal.     Pupils:  Pupils are equal, round, and reactive to light.  Neck:     Thyroid: No thyromegaly.     Vascular: No carotid bruit or JVD.     Trachea: No tracheal deviation.  Cardiovascular:     Rate and Rhythm: Normal rate. Rhythm irregular. Frequent extrasystoles are present.    Heart sounds: Murmur heard.  Systolic murmur is present with a grade of 2/6.      Comments: Intermittent premature beat.  Pulmonary:     Effort: Pulmonary effort is normal. No accessory muscle usage or respiratory distress.     Breath sounds: Examination of the right-upper field reveals rhonchi. Examination of the left-upper field reveals rhonchi. Examination of the right-middle field reveals rhonchi. Examination of the left-middle field reveals rhonchi. Wheezing and rhonchi present.     Comments: Rhonchi heard throughout the lung fields. Not clearing with cough. Tenderness along the right lower chest with mild palpation.  Abdominal:     Palpations: Abdomen is soft.  Musculoskeletal:        General: Normal range of motion.     Cervical back: Normal range of motion and neck supple.  Lymphadenopathy:     Cervical: No cervical adenopathy.  Skin:    General: Skin is warm and dry.  Neurological:     General: No focal deficit present.     Mental Status: He is alert and oriented to person, place, and time.  Psychiatric:  Mood and Affect: Mood is anxious and depressed.        Speech: Speech normal.        Behavior: Behavior normal.        Thought Content: Thought content normal.        Judgment: Judgment normal.   Assessment/Plan: 1. Syncope and collapse Reviewed results of Carotid doppler and MRI with the patient. The RIGHT CAROTID shows 50 to 60% stenosis. The LEFT CAROTID shows less than 50% stenosis. There is mild to moderate plaque formation noted on the LEFT and insignificant plaque on the RIGHT side. He also had MRI of the brain. This was positive for T1 hyperintensity in the bilateral globus pallidus correlating  with cirrhosis seen by prior CTs. Mild cortical atrophy. There is subcentimeter focus of restricted diffusion in the left temporal white matter without enhancement. Few remote white matter insults, typical for age. Unsure if findings are contributing to current symptoms. A referral to neruology made for further evaluation and treatment.  - Ambulatory referral to Neurology  2. Left sided lacunar infarction [facility]) Reviewed results of Carotid doppler and MRI with the patient. The RIGHT CAROTID shows 50 to 60% stenosis. The LEFT CAROTID shows less than 50% stenosis. There is mild to moderate plaque formation noted on the LEFT and insignificant plaque on the RIGHT side. He also had MRI of the brain. This was positive for T1 hyperintensity in the bilateral globus pallidus correlating with cirrhosis seen by prior CTs. Mild cortical atrophy. There is subcentimeter focus of restricted diffusion in the left temporal white matter without enhancement. Few remote white matter insults, typical for age. Unsure if findings are contributing to current symptoms. A referral to neruology made for further evaluation and treatment.  - Ambulatory referral to Neurology  3. Chronic obstructive pulmonary disease with acute exacerbation ([place]) Patient should continue all inhalers and respiratory medications as prescribed. Follow up with pulmonary as schedule.d   4. Type 2 diabetes mellitus with hyperglycemia, without long-term current use of insulin (HCC) Continue diabetic medication as prescribed   5. Atrial fibrillation, unspecified type [facility]) Regular visits with cardiology as scheduled.   General Counseling: Cederic verbalizes understanding of the findings of todays visit and agrees with plan of treatment. I have discussed any further diagnostic evaluation that may be needed or ordered today. We also reviewed his medications today. he has been encouraged to call the office with any questions or concerns that should arise  related to todays visit.    Orders Placed This Encounter  Procedures  . Ambulatory referral to Neurology   This patient was seen by [name] FNP Collaboration with Dr Lavera Guise as a part of collaborative care agreement  Total time spent: 30 Minutes   Time spent includes review of chart, medications, test results, and follow up plan with the patient.      Dr Lavera Guise Internal medicine

## 2020-03-31 ENCOUNTER — Other Ambulatory Visit: Payer: Self-pay | Admitting: Pharmacy Technician

## 2020-03-31 ENCOUNTER — Telehealth: Payer: Self-pay

## 2020-03-31 NOTE — Patient Outreach (Signed)
[place] [facility]) Care Management  03/31/2020  RHYAN RADLER [birth year] [id number]  4 care coordination calls made to various patient assistance companies to check on the status/delivery of patient's applications/medications.  1st care coordination call placed to [employer] in regards to patient's Daliresp application. Spoke to [place] who informed patient was APPROVED 03/28/20-09/16/20. She informed the medication would be delivered today via [employer]. The tracking number is [id number].  2nd care coordination call placed to [employer] in regards to patient's Eliquis application. Spoke to [place] who informed patient was APPROVED 03/28/20-09/16/20 and the medication was delivered on 03/30/2020.  3rd care coordination call placed to [place] in regards to patient's Multaq application. Spoke to [place] who informed patient was APPROVED 03/30/2020-09/16/2020. Cecelia informed that the medication is to be Dr. Loyal Jacobson office on 04/01/2020.  4th care coordination call placed to [place] in regards to patient's application for Advair, Incruse and Ventolin HFA. Spoke to [place] who informed patient was APPROVED for the [place] on 03/30/2020-09/16/2020. Ventolin HFA was removed from the patient assistance medication list in June and is no longer available for processing. She informed the medication should ship on 7/16 and then delivery process in 4-6 business days from that date.  Will follow up with patient in 5-7 business days to confirm all medications were received and to discuss refill processes of each company.  Yovany Clock P. Ranon Coven, [place]  [phone]

## 2020-03-31 NOTE — Telephone Encounter (Signed)
Per fax from [place] [facility], patient has been approved for Eliquis free of charge from 03/28/2020 through 09/16/2020.

## 2020-04-01 ENCOUNTER — Telehealth: Payer: Self-pay

## 2020-04-01 NOTE — Telephone Encounter (Signed)
Patient notified the Multaq from [facility] Patient Assistance Program is available to be picked up. The patient was told the medication Multaq will be placed at the front dest.  Multaq 400 mg LOT: [id number] Exp. 07/2022  # 180 Tabs

## 2020-04-03 NOTE — Progress Notes (Signed)
Reviewed with patient at visit. Referred to neurology for further evaluation.

## 2020-04-05 ENCOUNTER — Other Ambulatory Visit: Payer: Self-pay | Admitting: Physician Assistant

## 2020-04-05 ENCOUNTER — Other Ambulatory Visit: Payer: Self-pay

## 2020-04-05 ENCOUNTER — Other Ambulatory Visit: Payer: Self-pay | Admitting: Cardiovascular Disease

## 2020-04-05 MED ORDER — DOXYCYCLINE HYCLATE 100 MG PO TABS: 100.0000 mg | ORAL_TABLET | Freq: Every day | ORAL | 3 refills | Status: DC

## 2020-04-05 MED ORDER — CYCLOBENZAPRINE HCL 10 MG PO TABS: 10.0000 mg | ORAL_TABLET | Freq: Two times a day (BID) | ORAL | 2 refills | Status: DC

## 2020-04-06 ENCOUNTER — Other Ambulatory Visit: Payer: Self-pay | Admitting: Pharmacy Technician

## 2020-04-06 DIAGNOSIS — M47896 Other spondylosis, lumbar region: Secondary | ICD-10-CM | POA: Diagnosis not present

## 2020-04-06 DIAGNOSIS — I6381 Other cerebral infarction due to occlusion or stenosis of small artery: Secondary | ICD-10-CM | POA: Insufficient documentation

## 2020-04-06 DIAGNOSIS — Z79899 Other long term (current) drug therapy: Secondary | ICD-10-CM | POA: Diagnosis not present

## 2020-04-06 DIAGNOSIS — M545 Low back pain: Secondary | ICD-10-CM | POA: Diagnosis not present

## 2020-04-06 DIAGNOSIS — G894 Chronic pain syndrome: Secondary | ICD-10-CM | POA: Diagnosis not present

## 2020-04-06 NOTE — Patient Outreach (Signed)
[place]) Care Management  04/06/2020  Lawrence Steele 08/19/1951 [id number]   Successful call placed to patient regarding patient assistance medication delivery of Multaq from [employer], Eliquis from [place], [place] from [place] and [place] from [place], HIPAA identifiers verified.   Patient informs he received the Multaq and has picked it up from the provider's office. Informed patient provider will need to call/fax in his refill to [employer]  so he will have to outreach them and let them know where to send the request when he has 2-3 week supply remaining. Informed him [employer] will also fax the provider.  Patient informs he received both the Eliquis and Daliresp. Informed him that both of these medications should auto ship. Informed him to call the respective companies if he has less than 2-3 week supply and has not received next shipment. Informed him the phone numbers for the respective companies are in the paperwork that came with the medication or on the pharmacy label on the medication bottle.  Patient informs he has not received the Incruse or Advair yet. Informed him they were to arrive in 7-10 business days with the 23rd being the 10th business day. Informed him I would outreach company to find out the status of the shipment. Also informed him that he would not be receiving Ventolin HFA as they removed it from the list of patient assistance medications. Discussed other options with patient if cost is still a barrier in receiving the medication such as outreaching provider to change it to Proventil HFA and going through [employer] patient assistance, having provider send in for Albuterol HFA as it may cheaper on his insurance or even perhaps using [facility]. Patient informed he thinks he will be okay with the cost at this time considering his approval of above mentioned medications.  Patient verbalized understanding of the refill processes as outlined above as most of these were  renewals from last year.  Will outreach [employer] and follow back up with patient.   [name] P. Miosha Behe, [place]  [phone]

## 2020-04-06 NOTE — Patient Outreach (Signed)
[facility]) Care Management  04/06/2020  Lawrence Steele 1950-11-19 [id number]  ADDENDUM   Unsuccessful call placed to patient regarding patient assistance medication delivery of Incruse and Advair with [employer], HIPAA compliant voicemail left.   Was calling patient to update him on the delivery status of above named medications.  Follow up:  Will follow up with patient in 5-7 business days to confirm medications were received.  Marcello Tuzzolino P. Kaleiah Kutzer, [place]  [phone]

## 2020-04-06 NOTE — Patient Outreach (Addendum)
[place] [facility]) Care Management  04/06/2020  WESLIE PRETLOW [birth year] [id number]  ADDENDUM  Care coordination call placed to [place] in regards to patient's Incruse and Advair shipment.  Spoke to [place] who informed the items were in the last stage of processing and should ship either today or 04/07/2020 with an expected delivery to the patient's home address 4-6 days after the ship date.  Will outreach patient with this information.  Infant Zink P. Poet Hineman, [place]  [phone]

## 2020-04-10 DIAGNOSIS — J449 Chronic obstructive pulmonary disease, unspecified: Secondary | ICD-10-CM | POA: Diagnosis not present

## 2020-04-13 ENCOUNTER — Ambulatory Visit: Payer: Medicare HMO | Admitting: Internal Medicine

## 2020-04-13 DIAGNOSIS — R0602 Shortness of breath: Secondary | ICD-10-CM

## 2020-04-13 LAB — PULMONARY FUNCTION TEST

## 2020-04-14 ENCOUNTER — Other Ambulatory Visit: Payer: Self-pay | Admitting: Pharmacy Technician

## 2020-04-14 NOTE — Patient Outreach (Signed)
[place] [facility]) Care Management  04/14/2020  Lawrence Steele 1950/09/25 [id number]   Successful call placed to patient regarding patient assistance medication delivery of Incruse and Advair with [place], HIPAA identifiers verified.   Patient informs he received the inahlers listed above. Reviewed the refill procedure with the patient for this company as well as the other companies for which he was approved for patient assistance. Patient verbalized understanding and confirmed having name ane number.  Follow up:  Will close patient assistance case as the process has been completed.   Lawrence Steele, [place]  [phone]

## 2020-04-18 ENCOUNTER — Ambulatory Visit: Payer: Medicare HMO | Admitting: Internal Medicine

## 2020-04-19 ENCOUNTER — Telehealth: Payer: Self-pay

## 2020-04-19 NOTE — Telephone Encounter (Signed)
CMN for nasal cannula signed by provider and mailed back to [employer].

## 2020-04-21 ENCOUNTER — Ambulatory Visit: Payer: Medicare HMO | Admitting: Internal Medicine

## 2020-04-26 NOTE — Procedures (Signed)
Lawrence Steele, [id number]  DATE OF SERVICE: April 13, 2020  Complete Pulmonary Function Testing Interpretation:  FINDINGS:  The forced vital capacity is moderately decreased.  The FEV1 is 2.24 L which is 69% of predicted and is mildly decreased.  The FEV1 FVC ratio is normal.  Postbronchodilator there is no significant improvement in the FEV1 however clinical improvement may occur in the absence of spirometric improvement.  Total lung capacity is moderately decreased.  Residual volume is mildly decreased.  Residual volume total lung capacity ratio is increased.  FRC is decreased.  DLCO is mildly decreased.  DLCO for alveolar volume is normal.  IMPRESSION:  This pulmonary function study is consistent with moderate restrictive lung disease.  Patient has a reduced DLCO.  [name], MD [facility] Pulmonary Critical Care Medicine Sleep Medicine

## 2020-04-28 ENCOUNTER — Ambulatory Visit: Payer: Medicare HMO | Admitting: Internal Medicine

## 2020-04-28 ENCOUNTER — Encounter: Payer: Self-pay | Admitting: Internal Medicine

## 2020-04-28 ENCOUNTER — Other Ambulatory Visit: Payer: Self-pay

## 2020-04-28 VITALS — BP 110/68 | HR 65 | Temp 96.9°F | Resp 16 | Ht 70.0 in | Wt 252.8 lb

## 2020-04-28 DIAGNOSIS — J9611 Chronic respiratory failure with hypoxia: Secondary | ICD-10-CM | POA: Diagnosis not present

## 2020-04-28 DIAGNOSIS — Z9981 Dependence on supplemental oxygen: Secondary | ICD-10-CM | POA: Diagnosis not present

## 2020-04-28 DIAGNOSIS — Z9989 Dependence on other enabling machines and devices: Secondary | ICD-10-CM | POA: Diagnosis not present

## 2020-04-28 DIAGNOSIS — J0100 Acute maxillary sinusitis, unspecified: Secondary | ICD-10-CM | POA: Diagnosis not present

## 2020-04-28 DIAGNOSIS — I482 Chronic atrial fibrillation, unspecified: Secondary | ICD-10-CM

## 2020-04-28 DIAGNOSIS — G4733 Obstructive sleep apnea (adult) (pediatric): Secondary | ICD-10-CM | POA: Diagnosis not present

## 2020-04-28 DIAGNOSIS — J441 Chronic obstructive pulmonary disease with (acute) exacerbation: Secondary | ICD-10-CM | POA: Diagnosis not present

## 2020-04-28 MED ORDER — AZITHROMYCIN 250 MG PO TABS: ORAL_TABLET | ORAL | 0 refills | Status: DC

## 2020-04-28 NOTE — Progress Notes (Signed)
[facility], [place] [id number]  Pulmonary Sleep Medicine   Office Visit Note  Patient Name: Lawrence Steele DOB: 1951/05/15 MRN [id number]  Date of Service: 04/28/2020  Complaints/HPI: PT is here for pulm follow up.  He reports his breathing has remained in his usual state.  He has sob with minimal exertion.  He continues to use oxygen at home and had low saturation in office today initially wad own to 78% on room air at rest.  He is not wearing his oxygen today, however his o2 sat improves to 96% with time. Pt reports good compliance with CPAP therapy. Cleaning machine by hand, and changing filters and tubing as directed. Denies headaches, sinus issues, palpitations, or hemoptysis.   Pt is also complaining of sinus pressure, PND, and ear pressure.  He has some throat irritation, and facial pain/tenderness.   ROS  General: (-) fever, (-) chills, (-) night sweats, (-) weakness Skin: (-) rashes, (-) itching,. Eyes: (-) visual changes, (-) redness, (-) itching. Nose and Sinuses: (-) nasal stuffiness or itchiness, (-) postnasal drip, (-) nosebleeds, (-) sinus trouble. Mouth and Throat: (-) sore throat, (-) hoarseness. Neck: (-) swollen glands, (-) enlarged thyroid, (-) neck pain. Respiratory: - cough, (-) bloody sputum, - shortness of breath, - wheezing. Cardiovascular: - ankle swelling, (-) chest pain. Lymphatic: (-) lymph node enlargement. Neurologic: (-) numbness, (-) tingling. Psychiatric: (-) anxiety, (-) depression   Current Medication: Outpatient Encounter Medications as of 04/28/2020  Medication Sig Note   Accu-Chek Softclix Lancets lancets Use as instructed  Once a daily E11.65    acetaminophen (TYLENOL) 500 MG tablet Take 1,000 mg by mouth every 6 (six) hours as needed for headache.    albuterol (PROAIR HFA) 108 (90 Base) MCG/ACT inhaler Inhale 2 puffs into the lungs every 6 (six) hours as needed. (Patient taking differently: Inhale 2 puffs  into the lungs every 6 (six) hours as needed for wheezing. Ventolin)    allopurinol (ZYLOPRIM) 100 MG tablet Take 1 tablet (100 mg total) by mouth 2 (two) times daily.    apixaban (ELIQUIS) 5 MG TABS tablet Take 1 tablet (5 mg total) by mouth 2 (two) times daily.    atenolol (TENORMIN) 25 MG tablet Take 2 tablets (50 mg) in the morning and 1 tablet (25 mg) in the evening, (Patient taking differently: Take 25-50 mg by mouth 2 (two) times daily. Take 2 tablets (50 mg) in the morning and 1 tablet (25 mg) in the evening,)    atorvastatin (LIPITOR) 20 MG tablet Take 1 tablet by mouth once daily    Blood Glucose Monitoring Suppl (ACCU-CHEK AVIVA PLUS) w/Device KIT Use as directed    cyclobenzaprine (FLEXERIL) 10 MG tablet Take 1 tablet (10 mg total) by mouth 2 (two) times daily at 10 AM and 5 PM.    diazepam (VALIUM) 5 MG tablet Take 1 tablet po one time 1.5 hours prior to scheduled procedure. May repeat dose in 1 hour for persistent anxiety.    diclofenac sodium (VOLTAREN) 1 % GEL Apply 1 application topically 4 (four) times daily as needed (pain).     doxycycline (VIBRA-TABS) 100 MG tablet Take 1 tablet (100 mg total) by mouth daily.    dronedarone (MULTAQ) 400 MG tablet Take 1 tablet (400 mg total) by mouth 2 (two) times daily with a meal.    escitalopram (LEXAPRO) 5 MG tablet Take 1 tablet (5 mg total) by mouth daily.    Fluticasone-Salmeterol (ADVAIR) 250-50 MCG/DOSE AEPB  Inhale 1 puff into the lungs 2 (two) times daily.    furosemide (LASIX) 20 MG tablet TAKE 2 TABLETS BY MOUTH EVERY OTHER DAY ALTERNATING WITH 1 TABLET    gabapentin (NEURONTIN) 400 MG capsule Take 400 mg by mouth 4 (four) times daily.     glucose blood (ACCU-CHEK AVIVA PLUS) test strip Pt needs accu check Aviva plus lancets, test strips and meter to check blood sugars once daily    glucose blood (ACCU-CHEK AVIVA PLUS) test strip 1 each by Other route as needed for other. Use as instructed    hydroxypropyl  methylcellulose / hypromellose (ISOPTO TEARS / GONIOVISC) 2.5 % ophthalmic solution Place 1 drop into both eyes 4 (four) times daily as needed (eye irritation (sand like feeling)).     ipratropium (ATROVENT) 0.02 % nebulizer solution Use every 6 hours for Shortness of breath and wheezing and as needed.  Prescription is for 90 days (Patient taking differently: Take 0.5 mg by nebulization every 6 (six) hours as needed for wheezing or shortness of breath. Use every 6 hours for Shortness of breath and wheezing and as needed.  Prescription is for 90 days)    loratadine (CLARITIN) 10 MG tablet Take 10 mg by mouth daily.    meloxicam (MOBIC) 7.5 MG tablet Take 1 tablet (7.5 mg total) by mouth 2 (two) times daily.    metFORMIN (GLUCOPHAGE) 500 MG tablet Take 1 tablet (500 mg total) by mouth 2 (two) times daily with a meal.    nitroGLYCERIN (NITROSTAT) 0.4 MG SL tablet Place 0.4 mg under the tongue every 5 (five) minutes x 3 doses as needed for chest pain.     nystatin (MYCOSTATIN) 100000 UNIT/ML suspension Take 5 mLs (500,000 Units total) by mouth 4 (four) times daily as needed (mouth/throat irritation.).    oxyCODONE-acetaminophen (PERCOCET) 10-325 MG tablet Take 1 tablet by mouth 4 (four) times daily.    OXYGEN Inhale 2 L into the lungs continuous.     Roflumilast (DALIRESP) 250 MCG TABS Take 250 mcg by mouth daily.    tiotropium (SPIRIVA) 18 MCG inhalation capsule Place 18 mcg into inhaler and inhale daily.  03/11/2020: Uses interchangeably with Spiriva   traZODone (DESYREL) 100 MG tablet Take 1 tablet (100 mg total) by mouth at bedtime.    umeclidinium bromide (INCRUSE ELLIPTA) 62.5 MCG/INH AEPB Inhale 1 puff into the lungs daily.    No facility-administered encounter medications on file as of 04/28/2020.    Surgical History: Past Surgical History:  Procedure Laterality Date   ABDOMINAL AORTIC ANEURYSM REPAIR  03/06/2015   [facility]    ADENOIDECTOMY     AMPUTATION Left    partial  amputation   APPENDECTOMY     CARDIAC CATHETERIZATION  2010,03/16/2003,11/04/2002   CARDIOVERSION N/A 04/24/2018   Procedure: CARDIOVERSION;  Surgeon: Nelva Bush, MD;  Location: [place] [facility];  Service: Cardiovascular;  Laterality: N/A;   CARDIOVERSION N/A 09/05/2018   Procedure: CARDIOVERSION;  Surgeon: Minna Merritts, MD;  Location: [facility];  Service: Cardiovascular;  Laterality: N/A;   CARDIOVERSION N/A 11/24/2018   Procedure: CARDIOVERSION (CATH LAB);  Surgeon: Wellington Hampshire, MD;  Location: [place] [facility];  Service: Cardiovascular;  Laterality: N/A;   CARDIOVERSION N/A 03/18/2019   Procedure: CARDIOVERSION;  Surgeon: Minna Merritts, MD;  Location: [facility];  Service: Cardiovascular;  Laterality: N/A;   CARDIOVERSION N/A 11/13/2019   Procedure: CARDIOVERSION;  Surgeon: Minna Merritts, MD;  Location: [facility];  Service: Cardiovascular;  Laterality: N/A;   CORONARY ANGIOPLASTY  03/16/2003   stent to the RCA  & 2/18/2004stent mid circumflex   ELBOW SURGERY     HERNIA REPAIR     NASAL SINUS SURGERY     PENILE PROSTHESIS IMPLANT  1995   PILONIDAL CYST EXCISION     TEE WITHOUT CARDIOVERSION N/A 04/24/2018   Procedure: TRANSESOPHAGEAL ECHOCARDIOGRAM (TEE);  Surgeon: Nelva Bush, MD;  Location: [facility];  Service: Cardiovascular;  Laterality: N/A;   TONSILECTOMY, ADENOIDECTOMY, BILATERAL MYRINGOTOMY AND TUBES     UMBILICAL HERNIA REPAIR      Medical History: Past Medical History:  Diagnosis Date   AAA (abdominal aortic aneurysm) ([place])    a.  Status post endovascular repair at [facility] in 2016   Anxiety    Asthma    Atrial flutter ([place])    a. diagnosed 7/19; b. CHADS2VASc => 5 (CHF, HTN, age x 1, DM, vascular disease); c. on Eliquis; d. s/p TEE/DCCV 8/19 with repeat DCCV 12/19   Cancer ([place])    melanoma L index finger    Chronic diastolic CHF (congestive heart failure) (HCC)    COPD (chronic obstructive pulmonary disease) ([place])    Coronary artery disease    a.  LHC 5/10: left main 10%, mLAD-1 lesion 20%, mLAD-2 lesion 20%, D1 20%, mLCx-1 lesion 30%, mLCx-2 lesion 50%, OM1 20%, OM to 20%, LPL 20%, pRCA-1 lesion 20%, pRCA-2 lesion 20%.  EF 51% w/ mild inf HK   Diabetes mellitus with complication (HCC)    Hyperlipidemia    Hypertension    Iliac artery aneurysm, bilateral (HCC)    Obstructive sleep apnea-hypopnea syndrome     Family History: Family History  Problem Relation Age of Onset   Hypertension Mother    Hyperlipidemia Mother    Depression Mother    Heart attack Father 80   Heart disease Father    Hypertension Father    Hyperlipidemia Father     Social History: Social History   Socioeconomic History   Marital status: Divorced    Spouse name: Not on file   Number of children: 2   Years of education: 10    Highest education level: 10th grade  Occupational History   Occupation: disabled   Tobacco Use   Smoking status: Current Every Day Smoker    Packs/day: 0.25    Years: 45.00    Pack years: 11.25    Types: Cigarettes   Smokeless tobacco: Former Systems developer    Types: Chew   Tobacco comment: 3 to 4 cigarettes a day  Vaping Use   Vaping Use: Never used  Substance and Sexual Activity   Alcohol use: Not Currently   Drug use: No   Sexual activity: Not on file  Other Topics Concern   Not on file  Social History Narrative   Not on file   Social Determinants of Health   Financial Resource Strain:    Difficulty of Paying Living Expenses:   Food Insecurity: No Food Insecurity   Worried About Running Out of Food in the Last Year: Never true   Ran Out of Food in the Last Year: Never true  Transportation Needs: No Transportation Needs   Lack of Transportation (Medical): No   Lack of Transportation (Non-Medical): No  Physical Activity:    Days of Exercise per Week:    Minutes of Exercise per Session:   Stress:    Feeling of Stress :   Social Connections:    Frequency of Communication with  Friends and Family:    Frequency of  Social Gatherings with Friends and Family:    Attends Religious Services:    Active Member of Clubs or Organizations:    Attends Music therapist:    Marital Status:   Intimate Partner Violence:    Fear of Current or Ex-Partner:    Emotionally Abused:    Physically Abused:    Sexually Abused:     Vital Signs: Blood pressure 110/68, pulse 65, temperature (!) 96.9 F (36.1 C), resp. rate 16, height 5' 10"  (1.778 m), weight 252 lb 12.8 oz (114.7 kg), SpO2 96 %.  Examination: General Appearance: The patient is well-developed, well-nourished, and in no distress. Skin: Gross inspection of skin unremarkable. Head: normocephalic, no gross deformities. Eyes: no gross deformities noted. ENT: ears appear grossly normal no exudates. Neck: Supple. No thyromegaly. No LAD. Respiratory: course breath sounds bilaterally. Cardiovascular: Normal S1 and S2 without murmur or rub. Extremities: No cyanosis. pulses are equal. Neurologic: Alert and oriented. No involuntary movements.  LABS: Recent Results (from the past 2160 hour(s))  Pulmonary function test     Status: None   Collection Time: 04/13/20  9:00 AM  Result Value Ref Range   FEV1     FVC     FEV1/FVC     TLC     DLCO      Radiology: MR Brain W Wo Contrast  Result Date: 03/25/2020 CLINICAL DATA:  Subacute neuro deficits. Dizziness and off balance with slurred speech for 6-8 months EXAM: MRI HEAD WITHOUT AND WITH CONTRAST TECHNIQUE: Multiplanar, multiecho pulse sequences of the brain and surrounding structures were obtained without and with intravenous contrast. CONTRAST:  57m MULTIHANCE GADOBENATE DIMEGLUMINE 529 MG/ML IV SOLN COMPARISON:  None available FINDINGS: Brain: T1 hyperintensity in the bilateral globus pallidus correlating with cirrhosis seen by prior CTs. Mild cortical atrophy. Subcentimeter focus of restricted diffusion in the left temporal white matter without  enhancement. Few remote white matter insults, typical for age No blood products, hydrocephalus, masslike finding, or collection. Vascular: Normal flow voids and preserved vascular enhancements. There is a degree of intracranial tortuosity. Skull and upper cervical spine: No focal marrow lesion. Sinuses/Orbits: Mild opacification of ethmoid and right maxillary sinuses. Negative orbits. These results will be called to the ordering clinician or representative by the Radiologist Assistant, and communication documented in the PACS or [employer] IMPRESSION: 1. Incidental acute lacunar infarct in the left temporal white matter. Chronic ischemic injury is overall mild for age. 2. Globus pallidus mineralization in keeping with cirrhosis seen on prior CTs. 3. Mild cortical atrophy. Electronically Signed   By: JMonte FantasiaM.D.   On: 03/25/2020 10:32    No results found.  No results found.    Assessment and Plan: Patient Active Problem List   Diagnosis Date Noted   Left sided lacunar infarction ([place] 04/06/2020   Syncope and collapse 01/31/2020   Other symptoms and signs involving the nervous system 01/31/2020   Chronic obstructive pulmonary disease with acute exacerbation ([place] 10/23/2019   Fungal infection of toenail 10/23/2019   Encounter for general adult medical examination with abnormal findings 08/21/2019   Need for vaccination against Streptococcus pneumoniae 08/21/2019   Flu vaccine need 07/14/2019   Type 2 diabetes mellitus with hyperglycemia, without long-term current use of insulin ([place] 04/13/2019   Hoarseness of voice 03/05/2019   Acne vulgaris 12/05/2018   Atrial fibrillation ([place] 05/02/2018   Atrial flutter ([place] 04/09/2018   Uncontrolled type 2 diabetes mellitus with hyperglycemia ([place] 01/09/2018   Nicotine dependence with current  use 01/09/2018   Diabetes mellitus without complication ([place]) [id number]/47/8295   Myalgia 09/19/2017   Vitamin B12 deficiency  anemia 09/19/2017   Common migraine with intractable migraine 09/19/2017   Chronic hypoxemic respiratory failure ([place]) 09/19/2017   Peripheral vascular disease, unspecified ([place]) 09/19/2017   Dysuria 09/19/2017   Osteoarthritis 09/19/2017   Diverticulitis of intestine 09/19/2017   Cervicalgia 09/19/2017   Unspecified abdominal hernia without obstruction or gangrene 09/19/2017   Vasomotor rhinitis 09/19/2017   Occlusion and stenosis of bilateral carotid arteries 09/19/2017   Generalized anxiety disorder 09/19/2017   Malaise 09/19/2017   Chronic bronchitis ([place]) 09/19/2017   Obstructive sleep apnea (adult) (pediatric) 09/19/2017   Cardiac arrhythmia 09/19/2017   Smoker 04/02/2016   Pain in the chest 08/20/2014   SOB (shortness of breath) 08/20/2014   Coronary artery disease involving native coronary artery of native heart with angina pectoris with documented spasm ([place]) 08/20/2014   Emphysema of lung ([place]) 08/20/2014   Coronary artery disease involving native coronary artery of native heart with unstable angina pectoris ([place]) 08/20/2014   AAA (abdominal aortic aneurysm) without rupture ([place]) 08/20/2014   Iliac artery aneurysm, bilateral ([place]) 08/20/2014   Hyperlipidemia 08/20/2014   Essential hypertension 08/20/2014   Morbid obesity ([place]) 08/20/2014    1. Chronic obstructive pulmonary disease with acute exacerbation (HCC) Severe disease, continue current management and oxygen therapy as discussed.   2. Acute maxillary sinusitis, recurrence not specified Advised patient to take entire course of antibiotics as prescribed with food. Pt should return to clinic in 7-10 days if symptoms fail to improve or new symptoms develop.  - azithromycin (ZITHROMAX) 250 MG tablet; Take as directed  Dispense: 6 tablet; Refill: 0  3. Chronic hypoxemic respiratory failure (HCC) Discussed importance of using oxygen as prescribed.  Will order portable concentrator to help  patient with compliance.  He complains that the bottles run of of oxygen to quickly.   4. Supplemental oxygen dependent Continue to use oxygen as prescribed.   5. Chronic atrial fibrillation (HCC) Stable, continue to follow up with cardiology.   6. OSA on CPAP Continue with good cpap compliance.   General Counseling: I have discussed the findings of the evaluation and examination with Farah.  I have also discussed any further diagnostic evaluation thatmay be needed or ordered today. Jorell verbalizes understanding of the findings of todays visit. We also reviewed his medications today and discussed drug interactions and side effects including but not limited excessive drowsiness and altered mental states. We also discussed that there is always a risk not just to him but also people around him. he has been encouraged to call the office with any questions or concerns that should arise related to todays visit.  No orders of the defined types were placed in this encounter.    Time spent: 30 This patient was seen by Orson Gear AGNP-C in Collaboration with Dr. Devona Konig as a part of collaborative care agreement.   I have personally obtained a history, examined the patient, evaluated laboratory and imaging results, formulated the assessment and plan and placed orders.    [name], MD [facility] Pulmonary and Critical Care Sleep medicine

## 2020-05-10 ENCOUNTER — Ambulatory Visit: Payer: Medicare HMO | Admitting: Nurse Practitioner

## 2020-05-10 DIAGNOSIS — R42 Dizziness and giddiness: Secondary | ICD-10-CM | POA: Diagnosis not present

## 2020-05-10 DIAGNOSIS — R55 Syncope and collapse: Secondary | ICD-10-CM | POA: Diagnosis not present

## 2020-05-10 DIAGNOSIS — R251 Tremor, unspecified: Secondary | ICD-10-CM | POA: Diagnosis not present

## 2020-05-11 DIAGNOSIS — J449 Chronic obstructive pulmonary disease, unspecified: Secondary | ICD-10-CM | POA: Diagnosis not present

## 2020-05-17 ENCOUNTER — Telehealth: Payer: Self-pay

## 2020-05-17 ENCOUNTER — Other Ambulatory Visit: Payer: Self-pay

## 2020-05-17 ENCOUNTER — Encounter: Payer: Self-pay | Admitting: Nurse Practitioner

## 2020-05-17 ENCOUNTER — Ambulatory Visit (INDEPENDENT_AMBULATORY_CARE_PROVIDER_SITE_OTHER): Payer: Medicare HMO | Admitting: Nurse Practitioner

## 2020-05-17 VITALS — BP 138/71 | HR 76 | Temp 97.8°F | Resp 16 | Ht 70.0 in | Wt 246.0 lb

## 2020-05-17 DIAGNOSIS — E1165 Type 2 diabetes mellitus with hyperglycemia: Secondary | ICD-10-CM | POA: Diagnosis not present

## 2020-05-17 DIAGNOSIS — R269 Unspecified abnormalities of gait and mobility: Secondary | ICD-10-CM

## 2020-05-17 DIAGNOSIS — I10 Essential (primary) hypertension: Secondary | ICD-10-CM

## 2020-05-17 DIAGNOSIS — R42 Dizziness and giddiness: Secondary | ICD-10-CM

## 2020-05-17 DIAGNOSIS — R531 Weakness: Secondary | ICD-10-CM | POA: Diagnosis not present

## 2020-05-17 DIAGNOSIS — M542 Cervicalgia: Secondary | ICD-10-CM

## 2020-05-17 DIAGNOSIS — I482 Chronic atrial fibrillation, unspecified: Secondary | ICD-10-CM

## 2020-05-17 DIAGNOSIS — R296 Repeated falls: Secondary | ICD-10-CM | POA: Diagnosis not present

## 2020-05-17 LAB — POCT GLYCOSYLATED HEMOGLOBIN (HGB A1C): Hemoglobin A1C: 5.5 % (ref 4.0–5.6)

## 2020-05-17 MED ORDER — METFORMIN HCL 500 MG PO TABS: 250.0000 mg | ORAL_TABLET | Freq: Two times a day (BID) | ORAL | 1 refills | Status: DC

## 2020-05-17 MED ORDER — GABAPENTIN 400 MG PO CAPS: ORAL_CAPSULE | ORAL | 0 refills | Status: AC

## 2020-05-17 NOTE — Progress Notes (Signed)
[facility] [place], [place] [address]  Internal MEDICINE  Office Visit Note  Patient Name: Lawrence Steele  [id number]  [id number]  Date of Service: 05/17/2020  Chief Complaint  Patient presents with  . Diabetes  . Hyperlipidemia  . Hypertension  . Quality Metric Gaps    Hep C, tetnaus,ophthalmology exam    The patient is here for routine follow up. Continues to have these dizzy spells, sometimes, he blacks out completely for several minutes which have caused him to fall and injure himself. He recently saw neurologist, who suggested patient get a walker. No further examinations or tests were ordered. An order for walker was not sent to DME provider. He had fall yesterday, bruising his right knee. Several changes have been made to blood pressure medication, as it was thought this was contributing to his falls. He states that this has not improved as of yet. His blood pressure is stable and blood sugar is very well controlled with HgbA1c at 5.5 today. Currently taking only metformin 531m twice daily.       Current Medication: Outpatient Encounter Medications as of 05/17/2020  Medication Sig Note  . Accu-Chek Softclix Lancets lancets Use as instructed  Once a daily E11.65   . acetaminophen (TYLENOL) 500 MG tablet Take 1,000 mg by mouth every 6 (six) hours as needed for headache.   . albuterol (PROAIR HFA) 108 (90 Base) MCG/ACT inhaler Inhale 2 puffs into the lungs every 6 (six) hours as needed. (Patient taking differently: Inhale 2 puffs into the lungs every 6 (six) hours as needed for wheezing. Ventolin)   . allopurinol (ZYLOPRIM) 100 MG tablet Take 1 tablet (100 mg total) by mouth 2 (two) times daily.   .Marland Kitchenapixaban (ELIQUIS) 5 MG TABS tablet Take 1 tablet (5 mg total) by mouth 2 (two) times daily.   .Marland Kitchenatenolol (TENORMIN) 25 MG tablet Take 2 tablets (50 mg) in the morning and 1 tablet (25 mg) in the evening, (Patient taking differently: Take 25-50 mg by mouth 2  (two) times daily. Take 2 tablets (50 mg) in the morning and 1 tablet (25 mg) in the evening,)   . atorvastatin (LIPITOR) 20 MG tablet Take 1 tablet by mouth once daily   . Blood Glucose Monitoring Suppl (ACCU-CHEK AVIVA PLUS) w/Device KIT Use as directed   . cyclobenzaprine (FLEXERIL) 10 MG tablet Take 1 tablet (10 mg total) by mouth 2 (two) times daily at 10 AM and 5 PM.   . diazepam (VALIUM) 5 MG tablet Take 1 tablet po one time 1.5 hours prior to scheduled procedure. May repeat dose in 1 hour for persistent anxiety.   . diclofenac sodium (VOLTAREN) 1 % GEL Apply 1 application topically 4 (four) times daily as needed (pain).    .Marland Kitchendoxycycline (VIBRA-TABS) 100 MG tablet Take 1 tablet (100 mg total) by mouth daily.   .Marland Kitchendronedarone (MULTAQ) 400 MG tablet Take 1 tablet (400 mg total) by mouth 2 (two) times daily with a meal.   . escitalopram (LEXAPRO) 5 MG tablet Take 1 tablet (5 mg total) by mouth daily.   . Fluticasone-Salmeterol (ADVAIR) 250-50 MCG/DOSE AEPB Inhale 1 puff into the lungs 2 (two) times daily.   . furosemide (LASIX) 20 MG tablet TAKE 2 TABLETS BY MOUTH EVERY OTHER DAY ALTERNATING WITH 1 TABLET   . gabapentin (NEURONTIN) 400 MG capsule Decrease to TID for several days. If tolerated well, may decrease to twice daily.   .Marland Kitchenglucose blood (ACCU-CHEK AVIVA  PLUS) test strip Pt needs accu check Aviva plus lancets, test strips and meter to check blood sugars once daily   . glucose blood (ACCU-CHEK AVIVA PLUS) test strip 1 each by Other route as needed for other. Use as instructed   . hydroxypropyl methylcellulose / hypromellose (ISOPTO TEARS / GONIOVISC) 2.5 % ophthalmic solution Place 1 drop into both eyes 4 (four) times daily as needed (eye irritation (sand like feeling)).    Marland Kitchen ipratropium (ATROVENT) 0.02 % nebulizer solution Use every 6 hours for Shortness of breath and wheezing and as needed.  Prescription is for 90 days (Patient taking differently: Take 0.5 mg by nebulization every 6 (six)  hours as needed for wheezing or shortness of breath. Use every 6 hours for Shortness of breath and wheezing and as needed.  Prescription is for 90 days)   . loratadine (CLARITIN) 10 MG tablet Take 10 mg by mouth daily.   . meloxicam (MOBIC) 7.5 MG tablet Take 1 tablet (7.5 mg total) by mouth 2 (two) times daily.   . metFORMIN (GLUCOPHAGE) 500 MG tablet Take 0.5 tablets (250 mg total) by mouth 2 (two) times daily with a meal. Decreased dose based on very good blood sugar control   . nitroGLYCERIN (NITROSTAT) 0.4 MG SL tablet Place 0.4 mg under the tongue every 5 (five) minutes x 3 doses as needed for chest pain.    Marland Kitchen nystatin (MYCOSTATIN) 100000 UNIT/ML suspension Take 5 mLs (500,000 Units total) by mouth 4 (four) times daily as needed (mouth/throat irritation.).   Marland Kitchen oxyCODONE-acetaminophen (PERCOCET) 10-325 MG tablet Take 1 tablet by mouth 4 (four) times daily.   . OXYGEN Inhale 2 L into the lungs continuous.    . Roflumilast (DALIRESP) 250 MCG TABS Take 250 mcg by mouth daily.   Marland Kitchen tiotropium (SPIRIVA) 18 MCG inhalation capsule Place 18 mcg into inhaler and inhale daily.  03/11/2020: Uses interchangeably with Spiriva  . traZODone (DESYREL) 100 MG tablet Take 1 tablet (100 mg total) by mouth at bedtime.   Marland Kitchen umeclidinium bromide (INCRUSE ELLIPTA) 62.5 MCG/INH AEPB Inhale 1 puff into the lungs daily.   . [DISCONTINUED] azithromycin (ZITHROMAX) 250 MG tablet Take as directed   . [DISCONTINUED] gabapentin (NEURONTIN) 400 MG capsule Take 400 mg by mouth 4 (four) times daily.    . [DISCONTINUED] metFORMIN (GLUCOPHAGE) 500 MG tablet Take 1 tablet (500 mg total) by mouth 2 (two) times daily with a meal.    No facility-administered encounter medications on file as of 05/17/2020.    Surgical History: Past Surgical History:  Procedure Laterality Date  . ABDOMINAL AORTIC ANEURYSM REPAIR  03/06/2015   [place]    . AMPUTATION Left    partial amputation  . APPENDECTOMY    . CARDIAC  CATHETERIZATION  2010,03/16/2003,11/04/2002  . CARDIOVERSION N/A 04/24/2018   Procedure: CARDIOVERSION;  Surgeon: Nelva Bush, MD;  Location: [facility];  Service: Cardiovascular;  Laterality: N/A;  . CARDIOVERSION N/A 09/05/2018   Procedure: CARDIOVERSION;  Surgeon: Minna Merritts, MD;  Location: [facility];  Service: Cardiovascular;  Laterality: N/A;  . CARDIOVERSION N/A 11/24/2018   Procedure: CARDIOVERSION (CATH LAB);  Surgeon: Wellington Hampshire, MD;  Location: [facility];  Service: Cardiovascular;  Laterality: N/A;  . CARDIOVERSION N/A 03/18/2019   Procedure: CARDIOVERSION;  Surgeon: Minna Merritts, MD;  Location: [facility];  Service: Cardiovascular;  Laterality: N/A;  . CARDIOVERSION N/A 11/13/2019   Procedure: CARDIOVERSION;  Surgeon: Minna Merritts, MD;  Location: [facility];  Service: Cardiovascular;  Laterality: N/A;  . CORONARY ANGIOPLASTY  03/16/2003   stent to the RCA  & 2/18/2004stent mid circumflex  . ELBOW SURGERY    . HERNIA REPAIR    . NASAL SINUS SURGERY    . PENILE PROSTHESIS IMPLANT  1995  . PILONIDAL CYST EXCISION    . TEE WITHOUT CARDIOVERSION N/A 04/24/2018   Procedure: TRANSESOPHAGEAL ECHOCARDIOGRAM (TEE);  Surgeon: Nelva Bush, MD;  Location: [facility];  Service: Cardiovascular;  Laterality: N/A;  . TONSILECTOMY, ADENOIDECTOMY, BILATERAL MYRINGOTOMY AND TUBES    . UMBILICAL HERNIA REPAIR      Medical History: Past Medical History:  Diagnosis Date  . AAA (abdominal aortic aneurysm) ([place])    a.  Status post endovascular [facility] at [facility] in 2016  . Anxiety   . Asthma   . Atrial flutter ([place])    a. diagnosed 7/19; b. CHADS2VASc => 5 (CHF, HTN, age x 1, DM, vascular disease); c. on Eliquis; d. s/p TEE/DCCV 8/19 with repeat DCCV 12/19  . Cancer (HCC)    melanoma L index finger   . Chronic diastolic CHF (congestive heart failure) ([place])   . COPD (chronic obstructive pulmonary disease) ([place])   . Coronary artery disease    a. LHC 5/10: left main 10%, mLAD-1 lesion 20%,  mLAD-2 lesion 20%, D1 20%, mLCx-1 lesion 30%, mLCx-2 lesion 50%, OM1 20%, OM to 20%, LPL 20%, pRCA-1 lesion 20%, pRCA-2 lesion 20%.  EF 51% w/ mild inf HK  . Diabetes mellitus with complication ([place])   . Hyperlipidemia   . Hypertension   . Iliac artery aneurysm, bilateral ([place])   . Obstructive sleep apnea-hypopnea syndrome     Family History: Family History  Problem Relation Age of Onset  . Hypertension Mother   . Hyperlipidemia Mother   . Depression Mother   . Heart attack Father 71  . Heart disease Father   . Hypertension Father   . Hyperlipidemia Father     Social History   Socioeconomic History  . Marital status: Divorced    Spouse name: Not on file  . Number of children: 2  . Years of education: 10   . Highest education level: 10th grade  Occupational History  . Occupation: disabled   Tobacco Use  . Smoking status: Current Every Day Smoker    Packs/day: 0.25    Years: 45.00    Pack years: 11.25    Types: Cigarettes  . Smokeless tobacco: Former Systems developer    Types: Chew  . Tobacco comment: 3 to 4 cigarettes a day  Vaping Use  . Vaping Use: Never used  Substance and Sexual Activity  . Alcohol use: Not Currently  . Drug use: No  . Sexual activity: Not on file  Other Topics Concern  . Not on file  Social History Narrative  . Not on file   Social Determinants of Health   Financial Resource Strain:   . Difficulty of Paying Living Expenses: Not on file  Food Insecurity: No Food Insecurity  . Worried About Charity fundraiser in the Last Year: Never true  . Ran Out of Food in the Last Year: Never true  Transportation Needs: No Transportation Needs  . Lack of Transportation (Medical): No  . Lack of Transportation (Non-Medical): No  Physical Activity:   . Days of Exercise per Week: Not on file  . Minutes of Exercise per Session: Not on file  Stress:   . Feeling of Stress : Not on file  Social Connections:   .  Frequency of Communication with Friends and Family:  Not on file  . Frequency of Social Gatherings with Friends and Family: Not on file  . Attends Religious Services: Not on file  . Active Member of Clubs or Organizations: Not on file  . Attends Archivist Meetings: Not on file  . Marital Status: Not on file  Intimate Partner Violence:   . Fear of Current or Ex-Partner: Not on file  . Emotionally Abused: Not on file  . Physically Abused: Not on file  . Sexually Abused: Not on file      Review of Systems  Constitutional: Positive for activity change and fatigue. Negative for unexpected weight change.  HENT: Negative for postnasal drip, rhinorrhea, sore throat and voice change.   Respiratory: Positive for cough, shortness of breath and wheezing.        Intermittent wheezing.  Cardiovascular: Positive for palpitations. Negative for chest pain.       Seeing cardiology for chronic a-fib.  Blood pressure medication recently lowered.   Gastrointestinal: Negative for constipation, diarrhea, nausea and vomiting.  Endocrine: Negative for cold intolerance, heat intolerance, polydipsia and polyuria.       Blood sugars doing very well.   Musculoskeletal: Positive for arthralgias, back pain and myalgias.  Skin: Negative for rash.  Allergic/Immunologic: Positive for environmental allergies.  Neurological: Positive for dizziness and headaches. Negative for seizures.  Hematological: Negative for adenopathy.  Psychiatric/Behavioral: Positive for dysphoric mood. The patient is nervous/anxious.     Today's Vitals   05/17/20 0843  BP: 138/71  Pulse: 76  Resp: 16  Temp: 97.8 F (36.6 C)  SpO2: 90%  Weight: 246 lb (111.6 kg)  Height: 5' 10"  (1.778 m)   Body mass index is 35.3 kg/m.  Physical Exam Vitals and nursing note reviewed.  Constitutional:      Appearance: Normal appearance. He is well-developed. He is not ill-appearing.  HENT:     Head: Normocephalic.     Right Ear: External ear normal.     Left Ear: External ear  normal.     Nose: Nose normal.  Eyes:     Conjunctiva/sclera: Conjunctivae normal.     Pupils: Pupils are equal, round, and reactive to light.  Neck:     Thyroid: No thyromegaly.     Vascular: No carotid bruit or JVD.     Trachea: No tracheal deviation.  Cardiovascular:     Rate and Rhythm: Normal rate. Rhythm irregular. Frequent extrasystoles are present.    Heart sounds: Murmur heard.  Systolic murmur is present with a grade of 2/6.   Pulmonary:     Effort: Pulmonary effort is normal. No accessory muscle usage or respiratory distress.     Breath sounds: Wheezing present.     Comments: Mild, expiratory wheezes auscultated throughout the lung fields. These clear with cough Abdominal:     Palpations: Abdomen is soft.  Musculoskeletal:        General: Normal range of motion.     Cervical back: Normal range of motion and neck supple.  Lymphadenopathy:     Cervical: No cervical adenopathy.  Skin:    General: Skin is warm and dry.  Neurological:     General: No focal deficit present.     Mental Status: He is alert and oriented to person, place, and time.  Psychiatric:        Mood and Affect: Mood is anxious and depressed.        Speech: Speech normal.  Behavior: Behavior normal.        Thought Content: Thought content normal.        Judgment: Judgment normal.    Assessment/Plan: 1. Type 2 diabetes mellitus with hyperglycemia, without long-term current use of insulin (HCC) - POCT HgB A1C 5.5 today. Will lower dose of metformin to 269m twice daily. Patient to continue with low carbohydrate diet. Will monitor blood sugars closely and adjust metformin dosing back up to 5023mtwice daily as indicated.  - metFORMIN (GLUCOPHAGE) 500 MG tablet; Take 0.5 tablets (250 mg total) by mouth 2 (two) times daily with a meal. Decreased dose based on very good blood sugar control  Dispense: 180 tablet; Refill: 1  2. Frequent falls Frequent falls due to abnormality of gait and weakness.  Evaluated per neurology and was recommended he get walker. New prescription sent to DME provider for rolling walker with seat.  - For home use only DME 4 wheeled rolling walker with seat (D([id number] 3. Weakness Frequent falls due to abnormality of gait and weakness. Evaluated per neurology and was recommended he get walker. New prescription sent to DME provider for rolling walker with seat.  - For home use only DME 4 wheeled rolling walker with seat (D([id number] 4. Abnormality of gait Frequent falls due to abnormality of gait and weakness. Evaluated per neurology and was recommended he get walker. New prescription sent to DME provider for rolling walker with seat.  - For home use only DME 4 wheeled rolling walker with seat (D([id number] 5. Dizziness Frequent falls due to abnormality of gait and weakness. Evaluated per neurology and was recommended he get walker. New prescription sent to DME provider for rolling walker with seat.  Lowered dose metformin to 25050mid and will also wean dosing of gabapentin to TID for several days. May decrease to twice daily as needed if tolerated. Monitor symptoms closely.  - For home use only DME 4 wheeled rolling walker with seat (DM([id number]. Cervicalgia Wean dosing of gabapentin to TID for several days. May decrease to twice daily as needed if tolerated. Monitor symptoms closely. He should continue with routine visits to pain management.  - gabapentin (NEURONTIN) 400 MG capsule; Decrease to TID for several days. If tolerated well, may decrease to twice daily.  Dispense: 90 capsule; Refill: 0  7. Essential hypertension Improved with lower dose bp medication. Continue to monitor closely.   8. Chronic atrial fibrillation (HCC[place]inue regular visits with cardiology as scheduled  General Counseling: Keigan verbalizes understanding of the findings of todays visit and agrees with plan of treatment. I have discussed any further diagnostic evaluation that may be  needed or ordered today. We also reviewed his medications today. he has been encouraged to call the office with any questions or concerns that should arise related to todays visit.  This patient was seen by HeaLeretha PolP Collaboration with Dr FozLavera Guise a part of collaborative care agreement  Orders Placed This Encounter  Procedures  . For home use only DME 4 wheeled rolling walker with seat (DM([id number]. POCT HgB A1C    Meds ordered this encounter  Medications  . metFORMIN (GLUCOPHAGE) 500 MG tablet    Sig: Take 0.5 tablets (250 mg total) by mouth 2 (two) times daily with a meal. Decreased dose based on very good blood sugar control    Dispense:  180 tablet    Refill:  1    Order Specific Question:   Supervising Provider  Answer:   Lavera Guise [place]  . gabapentin (NEURONTIN) 400 MG capsule    Sig: Decrease to TID for several days. If tolerated well, may decrease to twice daily.    Dispense:  90 capsule    Refill:  0    Prescribed per pain management.    Order Specific Question:   Supervising Provider    Answer:   Lavera Guise [[id number]]    Total time spent: 34 Minutes   Time spent includes review of chart, medications, test results, and follow up plan with the patient.      Dr Lavera Guise Internal medicine

## 2020-05-17 NOTE — Telephone Encounter (Signed)
Spoke with ashley from [employer] for rolling walker with seat she will going to process and call us back

## 2020-06-02 ENCOUNTER — Other Ambulatory Visit: Payer: Self-pay

## 2020-06-02 NOTE — Patient Outreach (Signed)
[facility]) Care Management  06/02/2020  Lawrence Steele July 19, 1951 [id number]   Telephone call to patient for disease management follow up.   No answer.  HIPAA compliant voice message left.    Plan: If no return call, RN CM will attempt patient again in the month of December.  Jone Baseman, RN, MSN [place] Management Care Management Coordinator Direct Line [phone] Cell [phone] Toll Free: [phone]  Fax: [phone]

## 2020-06-03 ENCOUNTER — Telehealth: Payer: Self-pay

## 2020-06-03 NOTE — Telephone Encounter (Signed)
Form completed and faxed to [employer].

## 2020-06-07 NOTE — Telephone Encounter (Signed)
Medication was received today, 3 bottles of Multaq with #60 in each bottle. Patient informed medication has been received and he may pick up at his convenience. Per patient he has an appointment on Thursday with urology so he will stop by to pick it up on that day.

## 2020-06-08 NOTE — Progress Notes (Signed)
06/09/2020 8:28 AM   Lawrence Steele [birth year] [id number]  Referring provider: Ronnell Freshwater, NP [address] [place],  [place] [address] Chief Complaint  Patient presents with   Erectile Dysfunction    HPI: Lawrence Steele is a 69 y.o. male who presents today to establish urological care and discuss management of lower urinary tract symptoms.   -He had a penile prosthesis placed in 1995 by Dr. Quillian Quince with apparent complication and it was replaced at [facility] by Dr Gareth Eagle. -Not sexually active x3 years -He notes intermittent weak stream and postvoid dribbling -IPSS 18/35 -Denies dysuria, gross hematuria -Denies flank, abdominal or pelvic pain -Relates history of "prostate scraping" by Dr. Quillian Quince which required postoperative cystoscopy under anesthesia for hematuria      PMH: Past Medical History:  Diagnosis Date   AAA (abdominal aortic aneurysm) ([place])    a.  Status post endovascular repair at [facility] in 2016   Anxiety    Asthma    Atrial flutter ([place])    a. diagnosed 7/19; b. CHADS2VASc => 5 (CHF, HTN, age x 1, DM, vascular disease); c. on Eliquis; d. s/p TEE/DCCV 8/19 with repeat DCCV 12/19   Cancer ([place])    melanoma L index finger    Chronic diastolic CHF (congestive heart failure) (HCC)    COPD (chronic obstructive pulmonary disease) ([place])    Coronary artery disease    a. LHC 5/10: left main 10%, mLAD-1 lesion 20%, mLAD-2 lesion 20%, D1 20%, mLCx-1 lesion 30%, mLCx-2 lesion 50%, OM1 20%, OM to 20%, LPL 20%, pRCA-1 lesion 20%, pRCA-2 lesion 20%.  EF 51% w/ mild inf HK   Diabetes mellitus with complication (HCC)    Hyperlipidemia    Hypertension    Iliac artery aneurysm, bilateral ([place])    Obstructive sleep apnea-hypopnea syndrome     Surgical History: Past Surgical History:  Procedure Laterality Date   ABDOMINAL AORTIC ANEURYSM REPAIR  03/06/2015   [facility]    ADENOIDECTOMY     AMPUTATION Left    partial amputation   APPENDECTOMY      CARDIAC CATHETERIZATION  2010,03/16/2003,11/04/2002   CARDIOVERSION N/A 04/24/2018   Procedure: CARDIOVERSION;  Surgeon: Nelva Bush, MD;  Location: [facility];  Service: Cardiovascular;  Laterality: N/A;   CARDIOVERSION N/A 09/05/2018   Procedure: CARDIOVERSION;  Surgeon: Minna Merritts, MD;  Location: [facility];  Service: Cardiovascular;  Laterality: N/A;   CARDIOVERSION N/A 11/24/2018   Procedure: CARDIOVERSION (CATH LAB);  Surgeon: Wellington Hampshire, MD;  Location: [facility];  Service: Cardiovascular;  Laterality: N/A;   CARDIOVERSION N/A 03/18/2019   Procedure: CARDIOVERSION;  Surgeon: Minna Merritts, MD;  Location: [facility];  Service: Cardiovascular;  Laterality: N/A;   CARDIOVERSION N/A 11/13/2019   Procedure: CARDIOVERSION;  Surgeon: Minna Merritts, MD;  Location: [facility];  Service: Cardiovascular;  Laterality: N/A;   CORONARY ANGIOPLASTY  03/16/2003   stent to the RCA  & 2/18/2004stent mid circumflex   ELBOW SURGERY     HERNIA REPAIR     NASAL [place]     TEE WITHOUT CARDIOVERSION N/A 04/24/2018   Procedure: TRANSESOPHAGEAL ECHOCARDIOGRAM (TEE);  Surgeon: Nelva Bush, MD;  Location: [facility];  Service: Cardiovascular;  Laterality: N/A;   TONSILECTOMY, ADENOIDECTOMY, BILATERAL MYRINGOTOMY AND TUBES     UMBILICAL HERNIA REPAIR      Home Medications:  Allergies as of 06/09/2020   No Known Allergies  Medication List       Accurate as of June 09, 2020  8:28 AM. If you have any questions, ask your nurse or doctor.        Accu-Chek Aviva Plus test strip Generic drug: glucose blood Pt needs accu check Aviva plus lancets, test strips and meter to check blood sugars once daily   Accu-Chek Aviva Plus test strip Generic drug: glucose blood 1 each by Other route as needed for other. Use as instructed   Accu-Chek Aviva Plus w/Device Kit Use as directed   Accu-Chek Softclix Lancets  lancets Use as instructed  Once a daily E11.65   acetaminophen 500 MG tablet Commonly known as: TYLENOL Take 1,000 mg by mouth every 6 (six) hours as needed for headache.   albuterol 108 (90 Base) MCG/ACT inhaler Commonly known as: ProAir HFA Inhale 2 puffs into the lungs every 6 (six) hours as needed. What changed:   reasons to take this  additional instructions   allopurinol 100 MG tablet Commonly known as: ZYLOPRIM Take 1 tablet (100 mg total) by mouth 2 (two) times daily.   apixaban 5 MG Tabs tablet Commonly known as: Eliquis Take 1 tablet (5 mg total) by mouth 2 (two) times daily.   atenolol 25 MG tablet Commonly known as: TENORMIN Take 2 tablets (50 mg) in the morning and 1 tablet (25 mg) in the evening, What changed:   how much to take  how to take this  when to take this   atorvastatin 20 MG tablet Commonly known as: LIPITOR Take 1 tablet by mouth once daily   cyclobenzaprine 10 MG tablet Commonly known as: FLEXERIL Take 1 tablet (10 mg total) by mouth 2 (two) times daily at 10 AM and 5 PM.   Daliresp 250 MCG Tabs Generic drug: Roflumilast Take 250 mcg by mouth daily.   diazepam 5 MG tablet Commonly known as: VALIUM Take 1 tablet po one time 1.5 hours prior to scheduled procedure. May repeat dose in 1 hour for persistent anxiety.   diclofenac sodium 1 % Gel Commonly known as: VOLTAREN Apply 1 application topically 4 (four) times daily as needed (pain).   doxycycline 100 MG tablet Commonly known as: VIBRA-TABS Take 1 tablet (100 mg total) by mouth daily.   dronedarone 400 MG tablet Commonly known as: MULTAQ Take 1 tablet (400 mg total) by mouth 2 (two) times daily with a meal.   escitalopram 5 MG tablet Commonly known as: LEXAPRO Take 1 tablet (5 mg total) by mouth daily.   Fluticasone-Salmeterol 250-50 MCG/DOSE Aepb Commonly known as: ADVAIR Inhale 1 puff into the lungs 2 (two) times daily.   furosemide 20 MG tablet Commonly known as:  LASIX TAKE 2 TABLETS BY MOUTH EVERY OTHER DAY ALTERNATING WITH 1 TABLET   gabapentin 400 MG capsule Commonly known as: NEURONTIN Decrease to TID for several days. If tolerated well, may decrease to twice daily.   hydroxypropyl methylcellulose / hypromellose 2.5 % ophthalmic solution Commonly known as: ISOPTO TEARS / GONIOVISC Place 1 drop into both eyes 4 (four) times daily as needed (eye irritation (sand like feeling)).   Incruse Ellipta 62.5 MCG/INH Aepb Generic drug: umeclidinium bromide Inhale 1 puff into the lungs daily.   ipratropium 0.02 % nebulizer solution Commonly known as: ATROVENT Use every 6 hours for Shortness of breath and wheezing and as needed.  Prescription is for 90 days What changed:   how much to take  how to take this  when to take this  reasons to take this   loratadine 10 MG tablet Commonly known as: CLARITIN Take 10 mg by mouth daily.   meloxicam 7.5 MG tablet Commonly known as: MOBIC Take 1 tablet (7.5 mg total) by mouth 2 (two) times daily.   metFORMIN 500 MG tablet Commonly known as: GLUCOPHAGE Take 0.5 tablets (250 mg total) by mouth 2 (two) times daily with a meal. Decreased dose based on very good blood sugar control   nitroGLYCERIN 0.4 MG SL tablet Commonly known as: NITROSTAT Place 0.4 mg under the tongue every 5 (five) minutes x 3 doses as needed for chest pain.   nystatin 100000 UNIT/ML suspension Commonly known as: MYCOSTATIN Take 5 mLs (500,000 Units total) by mouth 4 (four) times daily as needed (mouth/throat irritation.).   oxyCODONE-acetaminophen 10-325 MG tablet Commonly known as: PERCOCET Take 1 tablet by mouth 4 (four) times daily.   OXYGEN Inhale 2 L into the lungs continuous.   tiotropium 18 MCG inhalation capsule Commonly known as: SPIRIVA Place 18 mcg into inhaler and inhale daily.   traZODone 100 MG tablet Commonly known as: DESYREL Take 1 tablet (100 mg total) by mouth at bedtime.       Allergies: No  Known Allergies  Family History: Family History  Problem Relation Age of Onset   Hypertension Mother    Hyperlipidemia Mother    Depression Mother    Heart attack Father 8   Heart disease Father    Hypertension Father    Hyperlipidemia Father     Social History:  reports that he has been smoking cigarettes. He has a 11.25 pack-year smoking history. He has quit using smokeless tobacco.  His smokeless tobacco use included chew. He reports previous alcohol use. He reports that he does not use drugs.   Physical Exam: BP (!) 93/54    Pulse 85    Ht 5' 10"  (1.778 m)    Wt 243 lb (110.2 kg)    BMI 34.87 kg/m   Constitutional:  Alert and oriented, No acute distress. HEENT: [place] AT, moist mucus membranes.  Trachea midline, no masses. Cardiovascular: No clubbing, cyanosis, or edema. Respiratory: Normal respiratory effort, no increased work of breathing. GI: Abdomen is soft, nontender, nondistended, no abdominal masses GU: No CVA tenderness. Penile implant in place.  Pump in good position.  Testes are located scrotally bilaterally.  Rectal: Prostate is approximately 30 grams, no nodules are appreciated.  Lymph: No cervical or inguinal lymphadenopathy. Skin: No rashes, bruises or suspicious lesions. Neurologic: Grossly intact, no focal deficits, moving all 4 extremities. Psychiatric: Normal mood and affect.  Laboratory Data:  Lab Results  Component Value Date   CREATININE 0.99 10/02/2019    Assessment & Plan:    1. BPH with weak stream Minimal bothersome urinary symptoms that do not require management at this time. Prior history of TURP Check PCP for last PSA RTC in 1 year for symptoms recheck.    Grandfather [address] [place], [place] [address] [phone]  I, Selena Batten, am acting as a scribe for Dr. Nicki Reaper C. Ioan Landini,  I have reviewed the above documentation for accuracy and completeness, and I agree with the above.    Abbie Sons, MD

## 2020-06-09 ENCOUNTER — Ambulatory Visit: Payer: Medicare HMO | Admitting: Urology

## 2020-06-09 ENCOUNTER — Other Ambulatory Visit: Payer: Self-pay

## 2020-06-09 ENCOUNTER — Encounter: Payer: Self-pay | Admitting: Urology

## 2020-06-09 VITALS — BP 93/54 | HR 85 | Ht 70.0 in | Wt 243.0 lb

## 2020-06-09 DIAGNOSIS — N400 Enlarged prostate without lower urinary tract symptoms: Secondary | ICD-10-CM

## 2020-06-11 DIAGNOSIS — J449 Chronic obstructive pulmonary disease, unspecified: Secondary | ICD-10-CM | POA: Diagnosis not present

## 2020-06-22 DIAGNOSIS — R2689 Other abnormalities of gait and mobility: Secondary | ICD-10-CM | POA: Diagnosis not present

## 2020-06-22 DIAGNOSIS — R531 Weakness: Secondary | ICD-10-CM | POA: Diagnosis not present

## 2020-06-22 DIAGNOSIS — R42 Dizziness and giddiness: Secondary | ICD-10-CM | POA: Diagnosis not present

## 2020-06-22 DIAGNOSIS — R296 Repeated falls: Secondary | ICD-10-CM | POA: Diagnosis not present

## 2020-06-29 DIAGNOSIS — I509 Heart failure, unspecified: Secondary | ICD-10-CM | POA: Diagnosis not present

## 2020-06-29 DIAGNOSIS — D6869 Other thrombophilia: Secondary | ICD-10-CM | POA: Diagnosis not present

## 2020-06-29 DIAGNOSIS — E261 Secondary hyperaldosteronism: Secondary | ICD-10-CM | POA: Diagnosis not present

## 2020-06-29 DIAGNOSIS — I25118 Atherosclerotic heart disease of native coronary artery with other forms of angina pectoris: Secondary | ICD-10-CM | POA: Diagnosis not present

## 2020-06-29 DIAGNOSIS — I4819 Other persistent atrial fibrillation: Secondary | ICD-10-CM | POA: Diagnosis not present

## 2020-06-29 DIAGNOSIS — E119 Type 2 diabetes mellitus without complications: Secondary | ICD-10-CM | POA: Diagnosis not present

## 2020-06-29 DIAGNOSIS — G8929 Other chronic pain: Secondary | ICD-10-CM | POA: Diagnosis not present

## 2020-06-29 DIAGNOSIS — J449 Chronic obstructive pulmonary disease, unspecified: Secondary | ICD-10-CM | POA: Diagnosis not present

## 2020-06-29 DIAGNOSIS — F17210 Nicotine dependence, cigarettes, uncomplicated: Secondary | ICD-10-CM | POA: Diagnosis not present

## 2020-07-01 ENCOUNTER — Other Ambulatory Visit: Payer: Self-pay

## 2020-07-01 ENCOUNTER — Other Ambulatory Visit: Payer: Self-pay | Admitting: Cardiovascular Disease

## 2020-07-01 DIAGNOSIS — Z5181 Encounter for therapeutic drug level monitoring: Secondary | ICD-10-CM | POA: Diagnosis not present

## 2020-07-01 DIAGNOSIS — G894 Chronic pain syndrome: Secondary | ICD-10-CM | POA: Diagnosis not present

## 2020-07-01 DIAGNOSIS — Z79891 Long term (current) use of opiate analgesic: Secondary | ICD-10-CM | POA: Diagnosis not present

## 2020-07-01 DIAGNOSIS — Z79899 Other long term (current) drug therapy: Secondary | ICD-10-CM | POA: Diagnosis not present

## 2020-07-01 DIAGNOSIS — M47896 Other spondylosis, lumbar region: Secondary | ICD-10-CM | POA: Diagnosis not present

## 2020-07-01 MED ORDER — ALLOPURINOL 100 MG PO TABS
100.0000 mg | ORAL_TABLET | Freq: Two times a day (BID) | ORAL | 1 refills | Status: DC
Start: 2020-07-01 — End: 2021-01-02

## 2020-07-01 NOTE — Telephone Encounter (Signed)
Canceled faxed request for dicyclomine and confirmed pharmacy received allopurinol.

## 2020-07-01 NOTE — Telephone Encounter (Signed)
Faxed refill request for allopurinol

## 2020-07-04 ENCOUNTER — Other Ambulatory Visit: Payer: Self-pay

## 2020-07-04 MED ORDER — CYCLOBENZAPRINE HCL 10 MG PO TABS
10.0000 mg | ORAL_TABLET | Freq: Two times a day (BID) | ORAL | 2 refills | Status: DC
Start: 2020-07-04 — End: 2020-10-05

## 2020-07-04 MED ORDER — CYCLOBENZAPRINE HCL 10 MG PO TABS
10.0000 mg | ORAL_TABLET | Freq: Two times a day (BID) | ORAL | 2 refills | Status: DC
Start: 2020-07-04 — End: 2020-07-04

## 2020-07-05 ENCOUNTER — Other Ambulatory Visit: Payer: Self-pay | Admitting: Cardiovascular Disease

## 2020-07-07 DIAGNOSIS — G4733 Obstructive sleep apnea (adult) (pediatric): Secondary | ICD-10-CM | POA: Diagnosis not present

## 2020-07-11 DIAGNOSIS — J449 Chronic obstructive pulmonary disease, unspecified: Secondary | ICD-10-CM | POA: Diagnosis not present

## 2020-08-01 ENCOUNTER — Encounter: Payer: Self-pay | Admitting: Pharmacist

## 2020-08-02 ENCOUNTER — Other Ambulatory Visit: Payer: Self-pay

## 2020-08-02 DIAGNOSIS — F411 Generalized anxiety disorder: Secondary | ICD-10-CM

## 2020-08-02 DIAGNOSIS — J42 Unspecified chronic bronchitis: Secondary | ICD-10-CM

## 2020-08-02 DIAGNOSIS — E1165 Type 2 diabetes mellitus with hyperglycemia: Secondary | ICD-10-CM

## 2020-08-02 MED ORDER — NYSTATIN 100000 UNIT/ML MT SUSP: 5.0000 mL | Freq: Four times a day (QID) | OROMUCOSAL | 1 refills | Status: AC | PRN

## 2020-08-02 MED ORDER — TRAZODONE HCL 100 MG PO TABS: 100.0000 mg | ORAL_TABLET | Freq: Every day | ORAL | 1 refills | Status: AC

## 2020-08-02 MED ORDER — METFORMIN HCL 500 MG PO TABS: 250.0000 mg | ORAL_TABLET | Freq: Two times a day (BID) | ORAL | 1 refills | Status: DC

## 2020-08-02 MED ORDER — ALBUTEROL SULFATE HFA 108 (90 BASE) MCG/ACT IN AERS: 2.0000 | INHALATION_SPRAY | Freq: Four times a day (QID) | RESPIRATORY_TRACT | 1 refills | Status: AC | PRN

## 2020-08-05 ENCOUNTER — Other Ambulatory Visit: Payer: Self-pay

## 2020-08-05 ENCOUNTER — Encounter: Payer: Self-pay | Admitting: Nurse Practitioner

## 2020-08-05 ENCOUNTER — Ambulatory Visit (INDEPENDENT_AMBULATORY_CARE_PROVIDER_SITE_OTHER): Payer: Medicare HMO | Admitting: Nurse Practitioner

## 2020-08-05 VITALS — BP 112/60 | HR 90 | Temp 97.6°F | Resp 16 | Ht 70.0 in | Wt 248.6 lb

## 2020-08-05 DIAGNOSIS — I482 Chronic atrial fibrillation, unspecified: Secondary | ICD-10-CM | POA: Diagnosis not present

## 2020-08-05 DIAGNOSIS — J449 Chronic obstructive pulmonary disease, unspecified: Secondary | ICD-10-CM | POA: Diagnosis not present

## 2020-08-05 DIAGNOSIS — E1165 Type 2 diabetes mellitus with hyperglycemia: Secondary | ICD-10-CM | POA: Diagnosis not present

## 2020-08-05 DIAGNOSIS — G4733 Obstructive sleep apnea (adult) (pediatric): Secondary | ICD-10-CM

## 2020-08-05 DIAGNOSIS — Z9989 Dependence on other enabling machines and devices: Secondary | ICD-10-CM

## 2020-08-05 DIAGNOSIS — R42 Dizziness and giddiness: Secondary | ICD-10-CM

## 2020-08-05 LAB — POCT GLYCOSYLATED HEMOGLOBIN (HGB A1C): Hemoglobin A1C: 5.7 % — AB (ref 4.0–5.6)

## 2020-08-05 NOTE — Progress Notes (Signed)
[place], [place] [address]  Internal MEDICINE  Office Visit Note  Patient Name: Lawrence Steele  [id number]  [id number]  Date of Service: 09/03/2020  Chief Complaint  Patient presents with  . Follow-up  . Diabetes  . Hyperlipidemia  . Hypertension    The patient is here for follow up visit.  -good control of blood sugars. HgbA1c is 5.7 today.  -blood pressure improved. Followed routinely by cardiology -did see neurologist due to frequent dizzy spells. These have improved. Recommended he split dose of BP medication to take 1/2 in AM and 1/2 in PM -has had follow up with urologist with good report.  -scheduled for health maintenance exam 08/2020 -has diabetic eye exam scheduled for 09/2020      Current Medication: Outpatient Encounter Medications as of 08/05/2020  Medication Sig Note  . Accu-Chek Softclix Lancets lancets Use as instructed  Once a daily E11.65   . acetaminophen (TYLENOL) 500 MG tablet Take 1,000 mg by mouth every 6 (six) hours as needed for headache.   . albuterol (VENTOLIN HFA) 108 (90 Base) MCG/ACT inhaler Inhale 2 puffs into the lungs every 6 (six) hours as needed for wheezing or shortness of breath.   . allopurinol (ZYLOPRIM) 100 MG tablet Take 1 tablet (100 mg total) by mouth 2 (two) times daily.   Marland Kitchen apixaban (ELIQUIS) 5 MG TABS tablet Take 1 tablet (5 mg total) by mouth 2 (two) times daily.   Marland Kitchen atenolol (TENORMIN) 25 MG tablet Take 2 tablets (50 mg) in the morning and 1 tablet (25 mg) in the evening, (Patient taking differently: Take 25-50 mg by mouth 2 (two) times daily. Take 2 tablets (50 mg) in the morning and 1 tablet (25 mg) in the evening,)   . atorvastatin (LIPITOR) 20 MG tablet Take 1 tablet by mouth once daily   . Blood Glucose Monitoring Suppl (ACCU-CHEK AVIVA PLUS) w/Device KIT Use as directed   . cyclobenzaprine (FLEXERIL) 10 MG tablet Take 1 tablet (10 mg total) by mouth 2 (two) times daily at 10 AM and 5  PM.   . diazepam (VALIUM) 5 MG tablet Take 1 tablet po one time 1.5 hours prior to scheduled procedure. May repeat dose in 1 hour for persistent anxiety.   . diclofenac sodium (VOLTAREN) 1 % GEL Apply 1 application topically 4 (four) times daily as needed (pain).    Marland Kitchen doxycycline (VIBRA-TABS) 100 MG tablet Take 1 tablet (100 mg total) by mouth daily.   Marland Kitchen dronedarone (MULTAQ) 400 MG tablet Take 1 tablet (400 mg total) by mouth 2 (two) times daily with a meal.   . escitalopram (LEXAPRO) 5 MG tablet Take 1 tablet (5 mg total) by mouth daily.   . Fluticasone-Salmeterol (ADVAIR) 250-50 MCG/DOSE AEPB Inhale 1 puff into the lungs 2 (two) times daily.   . furosemide (LASIX) 20 MG tablet TAKE 2 TABLETS BY MOUTH EVERY OTHER DAY ALTERNATING WITH 1 TABLET   . gabapentin (NEURONTIN) 400 MG capsule Decrease to TID for several days. If tolerated well, may decrease to twice daily.   Marland Kitchen glucose blood (ACCU-CHEK AVIVA PLUS) test strip 1 each by Other route as needed for other. Use as instructed   . hydroxypropyl methylcellulose / hypromellose (ISOPTO TEARS / GONIOVISC) 2.5 % ophthalmic solution Place 1 drop into both eyes 4 (four) times daily as needed (eye irritation (sand like feeling)).    Marland Kitchen ipratropium (ATROVENT) 0.02 % nebulizer solution Use every 6 hours for Shortness of breath  and wheezing and as needed.  Prescription is for 90 days (Patient taking differently: Take 0.5 mg by nebulization every 6 (six) hours as needed for wheezing or shortness of breath. Use every 6 hours for Shortness of breath and wheezing and as needed.  Prescription is for 90 days)   . loratadine (CLARITIN) 10 MG tablet Take 10 mg by mouth daily.   . meloxicam (MOBIC) 7.5 MG tablet Take 1 tablet (7.5 mg total) by mouth 2 (two) times daily.   . metFORMIN (GLUCOPHAGE) 500 MG tablet Take 0.5 tablets (250 mg total) by mouth 2 (two) times daily with a meal. Decreased dose based on very good blood sugar control   . nitroGLYCERIN (NITROSTAT) 0.4 MG  SL tablet Place 0.4 mg under the tongue every 5 (five) minutes x 3 doses as needed for chest pain.    Marland Kitchen nystatin (MYCOSTATIN) 100000 UNIT/ML suspension Take 5 mLs (500,000 Units total) by mouth 4 (four) times daily as needed (mouth/throat irritation.).   Marland Kitchen oxyCODONE-acetaminophen (PERCOCET) 10-325 MG tablet Take 1 tablet by mouth 4 (four) times daily.   . OXYGEN Inhale 2 L into the lungs continuous.    . Roflumilast (DALIRESP) 250 MCG TABS Take 250 mcg by mouth daily.   Marland Kitchen tiotropium (SPIRIVA) 18 MCG inhalation capsule Place 18 mcg into inhaler and inhale daily.  03/11/2020: Uses interchangeably with Spiriva  . traZODone (DESYREL) 100 MG tablet Take 1 tablet (100 mg total) by mouth at bedtime.   Marland Kitchen umeclidinium bromide (INCRUSE ELLIPTA) 62.5 MCG/INH AEPB Inhale 1 puff into the lungs daily.   . [DISCONTINUED] albuterol (PROAIR HFA) 108 (90 Base) MCG/ACT inhaler Inhale 2 puffs into the lungs every 6 (six) hours as needed.   . [DISCONTINUED] glucose blood (ACCU-CHEK AVIVA PLUS) test strip Pt needs accu check Aviva plus lancets, test strips and meter to check blood sugars once daily    No facility-administered encounter medications on file as of 08/05/2020.    Surgical History: Past Surgical History:  Procedure Laterality Date  . ABDOMINAL AORTIC ANEURYSM REPAIR  03/06/2015   [place] [facility]    . AMPUTATION Left    partial amputation  . APPENDECTOMY    . CARDIAC CATHETERIZATION  2010,03/16/2003,11/04/2002  . CARDIOVERSION N/A 04/24/2018   Procedure: CARDIOVERSION;  Surgeon: Nelva Bush, MD;  Location: [facility];  Service: Cardiovascular;  Laterality: N/A;  . CARDIOVERSION N/A 09/05/2018   Procedure: CARDIOVERSION;  Surgeon: Minna Merritts, MD;  Location: [facility];  Service: Cardiovascular;  Laterality: N/A;  . CARDIOVERSION N/A 11/24/2018   Procedure: CARDIOVERSION (CATH LAB);  Surgeon: Wellington Hampshire, MD;  Location: [facility];  Service: Cardiovascular;  Laterality: N/A;  .  CARDIOVERSION N/A 03/18/2019   Procedure: CARDIOVERSION;  Surgeon: Minna Merritts, MD;  Location: [facility];  Service: Cardiovascular;  Laterality: N/A;  . CARDIOVERSION N/A 11/13/2019   Procedure: CARDIOVERSION;  Surgeon: Minna Merritts, MD;  Location: [facility];  Service: Cardiovascular;  Laterality: N/A;  . CORONARY ANGIOPLASTY  03/16/2003   stent to the RCA  & 2/18/2004stent mid circumflex  . ELBOW SURGERY    . HERNIA REPAIR    . NASAL SINUS SURGERY    . PENILE PROSTHESIS IMPLANT  1995  . PILONIDAL CYST EXCISION    . TEE WITHOUT CARDIOVERSION N/A 04/24/2018   Procedure: TRANSESOPHAGEAL ECHOCARDIOGRAM (TEE);  Surgeon: Nelva Bush, MD;  Location: [facility];  Service: Cardiovascular;  Laterality: N/A;  . TONSILECTOMY, ADENOIDECTOMY, BILATERAL MYRINGOTOMY AND TUBES    .  UMBILICAL HERNIA REPAIR      Medical History: Past Medical History:  Diagnosis Date  . AAA (abdominal aortic aneurysm) ([place])    a.  Status post endovascular repair at [facility] in 2016  . Anxiety   . Asthma   . Atrial flutter ([place])    a. diagnosed 7/19; b. CHADS2VASc => 5 (CHF, HTN, age x 1, DM, vascular disease); c. on Eliquis; d. s/p TEE/DCCV 8/19 with repeat DCCV 12/19  . Cancer (HCC)    melanoma L index finger   . Chronic diastolic CHF (congestive heart failure) ([place])   . COPD (chronic obstructive pulmonary disease) ([employer])   . Coronary artery disease    a. LHC 5/10: left main 10%, mLAD-1 lesion 20%, mLAD-2 lesion 20%, D1 20%, mLCx-1 lesion 30%, mLCx-2 lesion 50%, OM1 20%, OM to 20%, LPL 20%, pRCA-1 lesion 20%, pRCA-2 lesion 20%.  EF 51% w/ mild inf HK  . Diabetes mellitus with complication ([place])   . Hyperlipidemia   . Hypertension   . Iliac artery aneurysm, bilateral ([place])   . Obstructive sleep apnea-hypopnea syndrome     Family History: Family History  Problem Relation Age of Onset  . Hypertension Mother   . Hyperlipidemia Mother   . Depression Mother   . Heart attack Father 40  . Heart disease Father    . Hypertension Father   . Hyperlipidemia Father     Social History   Socioeconomic History  . Marital status: Divorced    Spouse name: Not on file  . Number of children: 2  . Years of education: 10   . Highest education level: 10th grade  Occupational History  . Occupation: disabled   Tobacco Use  . Smoking status: Current Every Day Smoker    Packs/day: 0.25    Years: 45.00    Pack years: 11.25    Types: Cigarettes  . Smokeless tobacco: Former Systems developer    Types: Chew  . Tobacco comment: 3 to 4 cigarettes a day  Vaping Use  . Vaping Use: Never used  Substance and Sexual Activity  . Alcohol use: Not Currently  . Drug use: No  . Sexual activity: Not on file  Other Topics Concern  . Not on file  Social History Narrative  . Not on file   Social Determinants of Health   Financial Resource Strain: Not on file  Food Insecurity: No Food Insecurity  . Worried About Charity fundraiser in the Last Year: Never true  . Ran Out of Food in the Last Year: Never true  Transportation Needs: No Transportation Needs  . Lack of Transportation (Medical): No  . Lack of Transportation (Non-Medical): No  Physical Activity: Not on file  Stress: Not on file  Social Connections: Not on file  Intimate Partner Violence: Not on file      Review of Systems  Constitutional: Negative for activity change, chills, fatigue and unexpected weight change.  HENT: Negative for congestion, postnasal drip, rhinorrhea, sneezing and sore throat.   Respiratory: Negative for cough, chest tightness, shortness of breath and wheezing.        Moderate to severe COPD and well managed on current inhalers and respiratory medication.   Cardiovascular: Negative for chest pain and palpitations.       Improved blood pressure.  Gastrointestinal: Negative for abdominal pain, anal bleeding, constipation, diarrhea, nausea and vomiting.  Endocrine: Negative for cold intolerance, heat intolerance, polydipsia and polyuria.        Blood sugars doing well  Musculoskeletal: Negative for arthralgias, back pain, joint swelling and neck pain.  Skin: Negative for rash.  Allergic/Immunologic: Positive for environmental allergies.  Neurological: Positive for dizziness. Negative for tremors and numbness.  Hematological: Negative for adenopathy. Does not bruise/bleed easily.  Psychiatric/Behavioral: Negative for behavioral problems (Depression), sleep disturbance and suicidal ideas. The patient is nervous/anxious.    Today's Vitals   08/05/20 1043  BP: 112/60  Pulse: 90  Resp: 16  Temp: 97.6 F (36.4 C)  SpO2: 95%  Weight: 248 lb 9.6 oz (112.8 kg)  Height: _0  (1.778 m)   Body mass index is 35.67 kg/m.  Physical Exam Vitals and nursing note reviewed.  Constitutional:      Appearance: Normal appearance. He is well-developed. He is not ill-appearing.  HENT:     Head: Normocephalic.     Right Ear: External ear normal.     Left Ear: External ear normal.     Nose: Nose normal.  Eyes:     Conjunctiva/sclera: Conjunctivae normal.     Pupils: Pupils are equal, round, and reactive to light.  Neck:     Thyroid: No thyromegaly.     Vascular: No carotid bruit or JVD.     Trachea: No tracheal deviation.  Cardiovascular:     Rate and Rhythm: Normal rate. Rhythm irregular. Frequent extrasystoles are present.    Heart sounds: Murmur heard.   Systolic murmur is present with a grade of 2/6.   Pulmonary:     Effort: Pulmonary effort is normal. No accessory muscle usage or respiratory distress.     Breath sounds: Wheezing present.     Comments: Mild, expiratory wheezes auscultated throughout the lung fields. These clear with cough Abdominal:     Palpations: Abdomen is soft.  Musculoskeletal:        General: Normal range of motion.     Cervical back: Normal range of motion and neck supple.  Lymphadenopathy:     Cervical: No cervical adenopathy.  Skin:    General: Skin is warm and dry.  Neurological:      General: No focal deficit present.     Mental Status: He is alert and oriented to person, place, and time.  Psychiatric:        Mood and Affect: Mood is anxious and depressed.        Speech: Speech normal.        Behavior: Behavior normal.        Thought Content: Thought content normal.        Judgment: Judgment normal.    Assessment/Plan: 1. Type 2 diabetes mellitus with hyperglycemia, without long-term current use of insulin (HCC) - POCT HgB A1C 5.7 today. Continue diabetic medication as prescribed  2. Dizziness Mildly improved. Patient to continue seeing neurology as scheduled and as needed.   3. COPD, severe ([place]) Stable. Continue inhalers and respiratory medications as prescribed   4. Chronic atrial fibrillation ([place]) Continue regular visits with cardiology as scheduled.   5. OSA on CPAP Continue regular visits with Cr. Devona Konig for CPAP management.   General Counseling: Askia verbalizes understanding of the findings of todays visit and agrees with plan of treatment. I have discussed any further diagnostic evaluation that may be needed or ordered today. We also reviewed his medications today. he has been encouraged to call the office with any questions or concerns that should arise related to todays visit.  This patient was seen by [name] FNP Collaboration with Dr Lavera Guise as a  part of collaborative care agreement  Orders Placed This Encounter  Procedures  . POCT HgB A1C      Total time spent: 30 Minutes Time spent includes review of chart, medications, test results, and follow up plan with the patient.      Dr Lavera Guise Internal medicine

## 2020-08-09 ENCOUNTER — Telehealth: Payer: Self-pay | Admitting: Internal Medicine

## 2020-08-09 NOTE — Telephone Encounter (Signed)
We received the patient's shipment of Multaq patient assistance in the office.  Enclosed were 2- 60 count bottles of Multaq 400 mg tablets.  I have called and spoken with the patient and notified him this was at our office and would be available for him to pick up at our front desk during regular business hours, although we are closed this week on Thursday & Friday due to the Thanksgiving holiday.   The patient voices understanding and is agreeable.

## 2020-08-11 DIAGNOSIS — J449 Chronic obstructive pulmonary disease, unspecified: Secondary | ICD-10-CM | POA: Diagnosis not present

## 2020-08-14 NOTE — Progress Notes (Deleted)
Cardiology Office Note  Date:  08/15/2020   ID:  Lawrence Steele, DOB June 08, 1951, MRN [id number]  PCP:  Ronnell Freshwater, NP   No chief complaint on file.   HPI:  Mr. Stamey is a 69 year old gentleman with hx of   COPD,  coronary artery disease, catheterization 2000  50% left circumflex disease diabetes type 2, hemoglobin A1c 9.2 ETOH, vodka long history of smoking, continues to smoke diastolic CHF,  hypertension,  hyperlipidemia,  Morbid obesity Followed at [facility] for a moderately large abdominal aortic aneurysm, dilated iliac arteries. S/p EVAR Ascending aorta <4 cm cough syncope, none in several years  chronic oxygen at night, not on exertion Cardioversion on 04/24/2018 for atrial flutter  Multiple cardioversions in 2020, July 10/2019: cardioversion Fib/flutter who presents for follow-up of his coronary artery disease and atrial flutter  Last clinic visit with me May 2021 At that time had some dizziness, orthostasis, had a fall, Imdur was held Reported having some balance issues, was working with PT, using a cane Using oxygen periodically On lasix 40 alternating with 20 daily Trace leg edema    Labs reviewed HBA1C 5.7 No recent lipid panel in our system  EKG personally reviewed by myself on todays visit Shows NSR rate 63 bpm no ST or T wave change  Other past medical history reviewed  stress test February 2018 No significant ischemia, inferoapical fixed perfusion defect small size  ejection fraction 62% Cardiac catheterization was offered if symptoms got worse  previous falls, low back pain, neuropathy Had MRI lumbar spine with mild degeneration of disks,  Presented to chest pain August 14, 2017  Hospital records reviewed with the patient in detail Notes indicating that he was drinking heavy vodka Diagnosed with alcohol intoxication level was 383  Previous cardiac catheterization from 2010 reviewed with him showing diffuse mild disease, 50% mid left  circumflex, 20% disease in the other vessels   CT ABd 09/2015 at [facility]  completed AAA endovascular stent at [facility] reports -- Stable aortobiiliac endograft with decreased excluded aneurysm size. The previously identified type II endoleak is not well seen on today's study. -- Stable bilateral common iliac artery aneurysms. -- Stable anterior abdominal wall hernia containing nonobstructed small bowel, anterior bladder, and penile prosthesis reservoir.  He is seen by Dr. Humphrey Rolls , pulmonary, did 6 min walk, 94% Only oxygen at night, discussed with him in detail  Was on lipitor for years, just decided to stop this on his own, possibly having stomach issues with Lipitor   previously reported having problems on Crestor , myalgias Myalgias on simvastatin  long history of shortness of breath, on several inhalers  He has been seen at [facility] for a moderately large abdominal aortic aneurysm as well as dilated iliac arteries.   cardiac catheterization 01/18/2009.  Notes indicate he has proximal 20% RCA disease, 10% left main disease, 30 follow by 50% mid left circumflex disease, 20% OM1 disease, 20% OM 2 disease, 20% disease of a ramus branch, 20% mid LAD disease and 20% D1 disease. No intervention was performed at that time He does report a cardiac catheterization prior to that. No further cardiac catheterization or stress testing since 2010  Previously reported chest pain over the past several years. with arm pain, possible numbness in his arm  in the hospital 04/01/2014 for chest pain, shortness of breath. Chest x-ray showed scarring at the left base, lab work reviewed with him showing negative cardiac enzymes, normal renal function, elevated alcohol level 0.28, BNP 270,  EKG with no significant ST or T-wave changes  Records below were researched and found on care anywhere, reviewed with the patient today Echocardiogram done at [facility] 07/28/2014 showing normal LV systolic function, dilated thoracic  aorta 4.0 cm at the sinus of Valsalva, 3.6 cm above the sinotubular junction, LVH otherwise normal study  CT scan of the abdomen details a infrarenal abdominal aortic aneurysm arising at the level of the IMA measuring up to 4.8 x 4.3 cm, iliac artery aneurysm 2.6 on the right, 2.2 on the left  PMH:   has a past medical history of AAA (abdominal aortic aneurysm) ([place]), Anxiety, Asthma, Atrial flutter ([place]), Cancer ([place]), Chronic diastolic CHF (congestive heart failure) ([place]), COPD (chronic obstructive pulmonary disease) ([place]), Coronary artery disease, Diabetes mellitus with complication ([place]), Hyperlipidemia, Hypertension, Iliac artery aneurysm, bilateral ([place]), and Obstructive sleep apnea-hypopnea syndrome.  PSH:    Past Surgical History:  Procedure Laterality Date  . ABDOMINAL AORTIC ANEURYSM REPAIR  03/06/2015   [place]    . AMPUTATION Left    partial amputation  . APPENDECTOMY    . CARDIAC CATHETERIZATION  2010,03/16/2003,11/04/2002  . CARDIOVERSION N/A 04/24/2018   Procedure: CARDIOVERSION;  Surgeon: Nelva Bush, MD;  Location: [facility];  Service: Cardiovascular;  Laterality: N/A;  . CARDIOVERSION N/A 09/05/2018   Procedure: CARDIOVERSION;  Surgeon: Minna Merritts, MD;  Location: [facility];  Service: Cardiovascular;  Laterality: N/A;  . CARDIOVERSION N/A 11/24/2018   Procedure: CARDIOVERSION (CATH LAB);  Surgeon: Wellington Hampshire, MD;  Location: [facility];  Service: Cardiovascular;  Laterality: N/A;  . CARDIOVERSION N/A 03/18/2019   Procedure: CARDIOVERSION;  Surgeon: Minna Merritts, MD;  Location: [facility];  Service: Cardiovascular;  Laterality: N/A;  . CARDIOVERSION N/A 11/13/2019   Procedure: CARDIOVERSION;  Surgeon: Minna Merritts, MD;  Location: [facility];  Service: Cardiovascular;  Laterality: N/A;  . CORONARY ANGIOPLASTY  03/16/2003   stent to the RCA  & 2/18/2004stent mid circumflex  . ELBOW SURGERY    . HERNIA REPAIR    . NASAL SINUS SURGERY    .  PENILE PROSTHESIS IMPLANT  1995  . PILONIDAL CYST EXCISION    . TEE WITHOUT CARDIOVERSION N/A 04/24/2018   Procedure: TRANSESOPHAGEAL ECHOCARDIOGRAM (TEE);  Surgeon: Nelva Bush, MD;  Location: [facility];  Service: Cardiovascular;  Laterality: N/A;  . TONSILECTOMY, ADENOIDECTOMY, BILATERAL MYRINGOTOMY AND TUBES    . UMBILICAL HERNIA REPAIR      Current Outpatient Medications  Medication Sig Dispense Refill  . Accu-Chek Softclix Lancets lancets Use as instructed  Once a daily E11.65 100 each 12  . acetaminophen (TYLENOL) 500 MG tablet Take 1,000 mg by mouth every 6 (six) hours as needed for headache.    . albuterol (VENTOLIN HFA) 108 (90 Base) MCG/ACT inhaler Inhale 2 puffs into the lungs every 6 (six) hours as needed for wheezing or shortness of breath. 54 each 1  . allopurinol (ZYLOPRIM) 100 MG tablet Take 1 tablet (100 mg total) by mouth 2 (two) times daily. 180 tablet 1  . apixaban (ELIQUIS) 5 MG TABS tablet Take 1 tablet (5 mg total) by mouth 2 (two) times daily. 180 tablet 3  . atenolol (TENORMIN) 25 MG tablet Take 2 tablets (50 mg) in the morning and 1 tablet (25 mg) in the evening, (Patient taking differently: Take 25-50 mg by mouth 2 (two) times daily. Take 2 tablets (50 mg) in the morning and 1 tablet (25 mg) in the evening,) 270 tablet 3  .  atorvastatin (LIPITOR) 20 MG tablet Take 1 tablet by mouth once daily 90 tablet 0  . Blood Glucose Monitoring Suppl (ACCU-CHEK AVIVA PLUS) w/Device KIT Use as directed 1 kit 0  . calcium carbonate (TUMS EX) 750 MG chewable tablet Chew by mouth.    . cyclobenzaprine (FLEXERIL) 10 MG tablet Take 1 tablet (10 mg total) by mouth 2 (two) times daily at 10 AM and 5 PM. 60 tablet 2  . diazepam (VALIUM) 5 MG tablet Take 1 tablet po one time 1.5 hours prior to scheduled procedure. May repeat dose in 1 hour for persistent anxiety. 2 tablet 0  . diclofenac sodium (VOLTAREN) 1 % GEL Apply 1 application topically 4 (four) times daily as needed (pain).     Marland Kitchen  doxycycline (VIBRA-TABS) 100 MG tablet Take 1 tablet (100 mg total) by mouth daily. 30 tablet 3  . dronedarone (MULTAQ) 400 MG tablet Take 1 tablet (400 mg total) by mouth 2 (two) times daily with a meal. 180 tablet 3  . escitalopram (LEXAPRO) 5 MG tablet Take 1 tablet (5 mg total) by mouth daily. 30 tablet 3  . Fluticasone-Salmeterol (ADVAIR) 250-50 MCG/DOSE AEPB Inhale 1 puff into the lungs 2 (two) times daily. 60 each 4  . furosemide (LASIX) 20 MG tablet TAKE 2 TABLETS BY MOUTH EVERY OTHER DAY ALTERNATING WITH 1 TABLET 135 tablet 2  . gabapentin (NEURONTIN) 400 MG capsule Decrease to TID for several days. If tolerated well, may decrease to twice daily. 90 capsule 0  . glucose blood (ACCU-CHEK AVIVA PLUS) test strip 1 each by Other route as needed for other. Use as instructed 100 each 3  . hydroxypropyl methylcellulose / hypromellose (ISOPTO TEARS / GONIOVISC) 2.5 % ophthalmic solution Place 1 drop into both eyes 4 (four) times daily as needed (eye irritation (sand like feeling)).     Marland Kitchen ipratropium (ATROVENT) 0.02 % nebulizer solution Use every 6 hours for Shortness of breath and wheezing and as needed.  Prescription is for 90 days (Patient taking differently: Take 0.5 mg by nebulization every 6 (six) hours as needed for wheezing or shortness of breath. Use every 6 hours for Shortness of breath and wheezing and as needed.  Prescription is for 90 days) 900 mL 1  . ipratropium-albuterol (DUONEB) 0.5-2.5 (3) MG/3ML SOLN Inhale into the lungs.    Marland Kitchen loratadine (CLARITIN) 10 MG tablet Take 10 mg by mouth daily.    . meloxicam (MOBIC) 7.5 MG tablet Take 1 tablet (7.5 mg total) by mouth 2 (two) times daily. 60 tablet 3  . metFORMIN (GLUCOPHAGE) 500 MG tablet Take 0.5 tablets (250 mg total) by mouth 2 (two) times daily with a meal. Decreased dose based on very good blood sugar control 180 tablet 1  . nitroGLYCERIN (NITROSTAT) 0.4 MG SL tablet Place 0.4 mg under the tongue every 5 (five) minutes x 3 doses as  needed for chest pain.     Marland Kitchen nystatin (MYCOSTATIN) 100000 UNIT/ML suspension Take 5 mLs (500,000 Units total) by mouth 4 (four) times daily as needed (mouth/throat irritation.). 200 mL 1  . oxyCODONE-acetaminophen (PERCOCET) 10-325 MG tablet Take 1 tablet by mouth 4 (four) times daily.    . OXYGEN Inhale 2 L into the lungs continuous.     . Roflumilast (DALIRESP) 250 MCG TABS Take 250 mcg by mouth daily. 31 tablet 4  . tiotropium (SPIRIVA) 18 MCG inhalation capsule Place 18 mcg into inhaler and inhale daily.     . traZODone (DESYREL) 100 MG tablet Take  1 tablet (100 mg total) by mouth at bedtime. 90 tablet 1  . umeclidinium bromide (INCRUSE ELLIPTA) 62.5 MCG/INH AEPB Inhale 1 puff into the lungs daily. 1 each 1   No current facility-administered medications for this visit.     Allergies:   Patient has no known allergies.   Social History:  The patient  reports that he has been smoking cigarettes. He has a 11.25 pack-year smoking history. He has quit using smokeless tobacco.  His smokeless tobacco use included chew. He reports previous alcohol use. He reports that he does not use drugs.   Family History:   family history includes Depression in his mother; Heart attack (age of onset: 72) in his father; Heart disease in his father; Hyperlipidemia in his father and mother; Hypertension in his father and mother.    Review of Systems: Review of Systems  Constitutional: Negative.   Respiratory: Positive for shortness of breath.   Cardiovascular: Negative.   Gastrointestinal: Negative.   Musculoskeletal: Negative.        Gait instability  Neurological: Negative.   Psychiatric/Behavioral: Negative.   All other systems reviewed and are negative.   PHYSICAL EXAM: VS:  There were no vitals taken for this visit. , BMI There is no height or weight on file to calculate BMI. Constitutional:  oriented to person, place, and time. No distress.  HENT:  Head: Grossly normal Eyes:  no discharge. No  scleral icterus.  Neck: No JVD, no carotid bruits  Cardiovascular: Regular rate and rhythm, no murmurs appreciated Pulmonary/Chest: Coarse breath sounds bilaterally with Rales Abdominal: Soft.  no distension.  no tenderness.  Musculoskeletal: Normal range of motion Neurological:  normal muscle tone. Coordination normal. No atrophy Skin: Skin warm and dry Psychiatric: normal affect, pleasant  Recent Labs: 10/02/2019: BUN 17; Creatinine, Ser 0.99; Hemoglobin 13.0; Platelets 168; Potassium 4.3; Sodium 138    Lipid Panel Lab Results  Component Value Date   CHOL 199 10/25/2016   HDL 56 10/25/2016   LDLCALC 120 (H) 10/25/2016   TRIG 114 10/25/2016    Wt Readings from Last 3 Encounters:  08/05/20 248 lb 9.6 oz (112.8 kg)  06/09/20 243 lb (110.2 kg)  05/17/20 246 lb (111.6 kg)     ASSESSMENT AND PLAN:  Coronary artery disease involving native coronary artery of native heart with angina pectoris with documented spasm ([place]) - Continues to smoke, smoking cessation recommended Denies anginal symptoms On Lipitor Lipids through primary care.  Goal LDL less than 70  Atrial flutter On Eliquis with atenolol Multaq Previously as it was only taking half a pill twice a day  Essential hypertension - Stay on atenolol   Severe COPD, chronic respiratory distress/centrilobular emphysema Followed by pulmonary, on nebulizers inhalers  Morbid obesity (HCC) Chronic back pain, joint pain  A1c at goal Weight still running high  Smoker Still smoking,  Underlying COPD No by pulmonary, on nebulizers and inhalers   Total encounter time more than 25 minutes  Greater than 50% was spent in counseling and coordination of care with the patient  Disposition:   F/U  6 months   No orders of the defined types were placed in this encounter.    Signed, Esmond Plants, M.D., Ph.D. 08/15/2020  [place], [place]

## 2020-08-15 ENCOUNTER — Ambulatory Visit: Payer: Medicare HMO | Admitting: Cardiovascular Disease

## 2020-08-16 DIAGNOSIS — I509 Heart failure, unspecified: Secondary | ICD-10-CM | POA: Diagnosis not present

## 2020-08-16 DIAGNOSIS — G4733 Obstructive sleep apnea (adult) (pediatric): Secondary | ICD-10-CM | POA: Diagnosis not present

## 2020-08-16 DIAGNOSIS — Z6835 Body mass index (BMI) 35.0-35.9, adult: Secondary | ICD-10-CM | POA: Diagnosis not present

## 2020-08-16 DIAGNOSIS — G8929 Other chronic pain: Secondary | ICD-10-CM | POA: Diagnosis not present

## 2020-08-16 DIAGNOSIS — E261 Secondary hyperaldosteronism: Secondary | ICD-10-CM | POA: Diagnosis not present

## 2020-08-16 DIAGNOSIS — Z7901 Long term (current) use of anticoagulants: Secondary | ICD-10-CM | POA: Diagnosis not present

## 2020-08-16 DIAGNOSIS — F17210 Nicotine dependence, cigarettes, uncomplicated: Secondary | ICD-10-CM | POA: Diagnosis not present

## 2020-08-16 DIAGNOSIS — D696 Thrombocytopenia, unspecified: Secondary | ICD-10-CM | POA: Diagnosis not present

## 2020-08-16 DIAGNOSIS — J9610 Chronic respiratory failure, unspecified whether with hypoxia or hypercapnia: Secondary | ICD-10-CM | POA: Diagnosis not present

## 2020-08-17 ENCOUNTER — Encounter: Payer: Self-pay | Admitting: Cardiovascular Disease

## 2020-08-23 ENCOUNTER — Other Ambulatory Visit: Payer: Self-pay | Admitting: Nurse Practitioner

## 2020-08-23 DIAGNOSIS — Z1159 Encounter for screening for other viral diseases: Secondary | ICD-10-CM | POA: Diagnosis not present

## 2020-08-23 DIAGNOSIS — I10 Essential (primary) hypertension: Secondary | ICD-10-CM | POA: Diagnosis not present

## 2020-08-23 DIAGNOSIS — Z125 Encounter for screening for malignant neoplasm of prostate: Secondary | ICD-10-CM | POA: Diagnosis not present

## 2020-08-23 DIAGNOSIS — E1165 Type 2 diabetes mellitus with hyperglycemia: Secondary | ICD-10-CM | POA: Diagnosis not present

## 2020-08-23 DIAGNOSIS — Z0001 Encounter for general adult medical examination with abnormal findings: Secondary | ICD-10-CM | POA: Diagnosis not present

## 2020-08-24 LAB — COMPREHENSIVE METABOLIC PANEL
ALT: 11 IU/L (ref 0–44)
AST: 21 IU/L (ref 0–40)
Albumin/Globulin Ratio: 1.1 — ABNORMAL LOW (ref 1.2–2.2)
Albumin: 3.9 g/dL (ref 3.8–4.8)
Alkaline Phosphatase: 95 IU/L (ref 44–121)
BUN/Creatinine Ratio: 13 (ref 10–24)
BUN: 13 mg/dL (ref 8–27)
Bilirubin Total: 0.4 mg/dL (ref 0.0–1.2)
CO2: 22 mmol/L (ref 20–29)
Calcium: 10.1 mg/dL (ref 8.6–10.2)
Chloride: 99 mmol/L (ref 96–106)
Creatinine, Ser: 1.04 mg/dL (ref 0.76–1.27)
GFR calc Af Amer: 84 mL/min/{1.73_m2} (ref >59)
GFR calc non Af Amer: 73 mL/min/{1.73_m2} (ref >59)
Globulin, Total: 3.7 g/dL (ref 1.5–4.5)
Glucose: 116 mg/dL — ABNORMAL HIGH (ref 65–99)
Potassium: 4.5 mmol/L (ref 3.5–5.2)
Sodium: 137 mmol/L (ref 134–144)
Total Protein: 7.6 g/dL (ref 6.0–8.5)

## 2020-08-24 LAB — CBC
Hematocrit: 34.5 % — ABNORMAL LOW (ref 37.5–51.0)
Hemoglobin: 11.6 g/dL — ABNORMAL LOW (ref 13.0–17.7)
MCH: 31.9 pg (ref 26.6–33.0)
MCHC: 33.6 g/dL (ref 31.5–35.7)
MCV: 95 fL (ref 79–97)
Platelets: 161 10*3/uL (ref 150–450)
RBC: 3.64 x10E6/uL — ABNORMAL LOW (ref 4.14–5.80)
RDW: 15.8 % — ABNORMAL HIGH (ref 11.6–15.4)
WBC: 7.6 10*3/uL (ref 3.4–10.8)

## 2020-08-24 LAB — T4, FREE: Free T4: 0.84 ng/dL (ref 0.82–1.77)

## 2020-08-24 LAB — HCV AB W REFLEX TO QUANT PCR: HCV Ab: <0.1 s/co ratio (ref 0.0–0.9)

## 2020-08-24 LAB — LIPID PANEL WITH LDL/HDL RATIO
Cholesterol, Total: 122 mg/dL (ref 100–199)
HDL: 52 mg/dL (ref >39)
LDL Chol Calc (NIH): 52 mg/dL (ref 0–99)
LDL/HDL Ratio: 1 ratio (ref 0.0–3.6)
Triglycerides: 97 mg/dL (ref 0–149)
VLDL Cholesterol Cal: 18 mg/dL (ref 5–40)

## 2020-08-24 LAB — HCV INTERPRETATION

## 2020-08-24 LAB — PSA: Prostate Specific Ag, Serum: 0.1 ng/mL (ref 0.0–4.0)

## 2020-08-24 LAB — TSH: TSH: 2.25 u[IU]/mL (ref 0.450–4.500)

## 2020-08-29 ENCOUNTER — Ambulatory Visit (INDEPENDENT_AMBULATORY_CARE_PROVIDER_SITE_OTHER): Payer: Medicare HMO | Admitting: Hospice and Palliative Medicine

## 2020-08-29 ENCOUNTER — Encounter: Payer: Self-pay | Admitting: Hospice and Palliative Medicine

## 2020-08-29 ENCOUNTER — Other Ambulatory Visit: Payer: Self-pay

## 2020-08-29 VITALS — BP 110/70 | Temp 97.9°F | Resp 16 | Ht 70.0 in | Wt 242.6 lb

## 2020-08-29 DIAGNOSIS — D649 Anemia, unspecified: Secondary | ICD-10-CM | POA: Diagnosis not present

## 2020-08-29 DIAGNOSIS — Z0001 Encounter for general adult medical examination with abnormal findings: Secondary | ICD-10-CM

## 2020-08-29 DIAGNOSIS — J441 Chronic obstructive pulmonary disease with (acute) exacerbation: Secondary | ICD-10-CM

## 2020-08-29 DIAGNOSIS — R300 Dysuria: Secondary | ICD-10-CM | POA: Diagnosis not present

## 2020-08-29 MED ORDER — AZITHROMYCIN 250 MG PO TABS: ORAL_TABLET | ORAL | 0 refills | Status: DC

## 2020-08-29 NOTE — Progress Notes (Signed)
[facility] [place], [place] [address]  Internal MEDICINE  Office Visit Note  Patient Name: Lawrence Steele  [id number]  [id number]  Date of Service: 09/01/2020  Chief Complaint  Patient presents with  . Medicare Wellness  . Hypertension  . Hyperlipidemia  . Diabetes  . COPD     HPI Pt is here for routine health maintenance examination Overall, things have been going well Complaining today of head cold, congestion, productive cough, chills and body aches and wheezing These symptoms started last week--slowly improving, has been taking Dayquil and Nyquil for symptom management Continues to use his daily inhalers, has also been using nebulizer treatments to help with wheezing Continues to monitor glucose levels--fasting levels average between 85-100  Takes doxycycline for prophylaxis of boils/cysts, followed by dermatology Followed by cardiology for CAD and atrial flutter-stable, medications managed Followed by [facility] vascular s/p EVAR for AAA 2016, stable, being closely followed for dilated ascending aorta at this time less than 4cm Followed by pain management for lumbar spondylosis, long term use on opiate pain medication Evaluated by urology in September for ED/BPH-no further treatment warranted, follow-up in 1 year  PHM: Colonoscopy done 2016--normal, due again 2026  Current Medication: Outpatient Encounter Medications as of 08/29/2020  Medication Sig Note  . Accu-Chek Softclix Lancets lancets Use as instructed  Once a daily E11.65   . acetaminophen (TYLENOL) 500 MG tablet Take 1,000 mg by mouth every 6 (six) hours as needed for headache.   . albuterol (VENTOLIN HFA) 108 (90 Base) MCG/ACT inhaler Inhale 2 puffs into the lungs every 6 (six) hours as needed for wheezing or shortness of breath.   . allopurinol (ZYLOPRIM) 100 MG tablet Take 1 tablet (100 mg total) by mouth 2 (two) times daily.   Marland Kitchen apixaban (ELIQUIS) 5 MG TABS tablet Take 1 tablet (5 mg  total) by mouth 2 (two) times daily.   Marland Kitchen atenolol (TENORMIN) 25 MG tablet Take 2 tablets (50 mg) in the morning and 1 tablet (25 mg) in the evening, (Patient taking differently: Take 25-50 mg by mouth 2 (two) times daily. Take 2 tablets (50 mg) in the morning and 1 tablet (25 mg) in the evening,)   . atorvastatin (LIPITOR) 20 MG tablet Take 1 tablet by mouth once daily   . Blood Glucose Monitoring Suppl (ACCU-CHEK AVIVA PLUS) w/Device KIT Use as directed   . calcium carbonate (TUMS EX) 750 MG chewable tablet Chew by mouth.   . cyclobenzaprine (FLEXERIL) 10 MG tablet Take 1 tablet (10 mg total) by mouth 2 (two) times daily at 10 AM and 5 PM.   . diazepam (VALIUM) 5 MG tablet Take 1 tablet po one time 1.5 hours prior to scheduled procedure. May repeat dose in 1 hour for persistent anxiety.   . diclofenac sodium (VOLTAREN) 1 % GEL Apply 1 application topically 4 (four) times daily as needed (pain).    Marland Kitchen doxycycline (VIBRA-TABS) 100 MG tablet Take 1 tablet (100 mg total) by mouth daily.   Marland Kitchen dronedarone (MULTAQ) 400 MG tablet Take 1 tablet (400 mg total) by mouth 2 (two) times daily with a meal.   . escitalopram (LEXAPRO) 5 MG tablet Take 1 tablet (5 mg total) by mouth daily.   . Fluticasone-Salmeterol (ADVAIR) 250-50 MCG/DOSE AEPB Inhale 1 puff into the lungs 2 (two) times daily.   . furosemide (LASIX) 20 MG tablet TAKE 2 TABLETS BY MOUTH EVERY OTHER DAY ALTERNATING WITH 1 TABLET   . gabapentin (NEURONTIN) 400 MG  capsule Decrease to TID for several days. If tolerated well, may decrease to twice daily.   Marland Kitchen glucose blood (ACCU-CHEK AVIVA PLUS) test strip 1 each by Other route as needed for other. Use as instructed   . hydroxypropyl methylcellulose / hypromellose (ISOPTO TEARS / GONIOVISC) 2.5 % ophthalmic solution Place 1 drop into both eyes 4 (four) times daily as needed (eye irritation (sand like feeling)).    Marland Kitchen ipratropium (ATROVENT) 0.02 % nebulizer solution Use every 6 hours for Shortness of breath  and wheezing and as needed.  Prescription is for 90 days (Patient taking differently: Take 0.5 mg by nebulization every 6 (six) hours as needed for wheezing or shortness of breath. Use every 6 hours for Shortness of breath and wheezing and as needed.  Prescription is for 90 days)   . ipratropium-albuterol (DUONEB) 0.5-2.5 (3) MG/3ML SOLN Inhale into the lungs.   Marland Kitchen loratadine (CLARITIN) 10 MG tablet Take 10 mg by mouth daily.   . meloxicam (MOBIC) 7.5 MG tablet Take 1 tablet (7.5 mg total) by mouth 2 (two) times daily.   . metFORMIN (GLUCOPHAGE) 500 MG tablet Take 0.5 tablets (250 mg total) by mouth 2 (two) times daily with a meal. Decreased dose based on very good blood sugar control   . nitroGLYCERIN (NITROSTAT) 0.4 MG SL tablet Place 0.4 mg under the tongue every 5 (five) minutes x 3 doses as needed for chest pain.    Marland Kitchen nystatin (MYCOSTATIN) 100000 UNIT/ML suspension Take 5 mLs (500,000 Units total) by mouth 4 (four) times daily as needed (mouth/throat irritation.).   Marland Kitchen oxyCODONE-acetaminophen (PERCOCET) 10-325 MG tablet Take 1 tablet by mouth 4 (four) times daily.   . OXYGEN Inhale 2 L into the lungs continuous.    . Roflumilast (DALIRESP) 250 MCG TABS Take 250 mcg by mouth daily.   Marland Kitchen tiotropium (SPIRIVA) 18 MCG inhalation capsule Place 18 mcg into inhaler and inhale daily.  03/11/2020: Uses interchangeably with Spiriva  . traZODone (DESYREL) 100 MG tablet Take 1 tablet (100 mg total) by mouth at bedtime.   Marland Kitchen umeclidinium bromide (INCRUSE ELLIPTA) 62.5 MCG/INH AEPB Inhale 1 puff into the lungs daily.   Marland Kitchen azithromycin (ZITHROMAX) 250 MG tablet Take one tab po qd for 14 days    No facility-administered encounter medications on file as of 08/29/2020.    Surgical History: Past Surgical History:  Procedure Laterality Date  . ABDOMINAL AORTIC ANEURYSM REPAIR  03/06/2015   [place]    . AMPUTATION Left    partial amputation  . APPENDECTOMY    . CARDIAC CATHETERIZATION   2010,03/16/2003,11/04/2002  . CARDIOVERSION N/A 04/24/2018   Procedure: CARDIOVERSION;  Surgeon: Nelva Bush, MD;  Location: [facility];  Service: Cardiovascular;  Laterality: N/A;  . CARDIOVERSION N/A 09/05/2018   Procedure: CARDIOVERSION;  Surgeon: Minna Merritts, MD;  Location: [facility];  Service: Cardiovascular;  Laterality: N/A;  . CARDIOVERSION N/A 11/24/2018   Procedure: CARDIOVERSION (CATH LAB);  Surgeon: Wellington Hampshire, MD;  Location: [facility];  Service: Cardiovascular;  Laterality: N/A;  . CARDIOVERSION N/A 03/18/2019   Procedure: CARDIOVERSION;  Surgeon: Minna Merritts, MD;  Location: [facility];  Service: Cardiovascular;  Laterality: N/A;  . CARDIOVERSION N/A 11/13/2019   Procedure: CARDIOVERSION;  Surgeon: Minna Merritts, MD;  Location: [facility];  Service: Cardiovascular;  Laterality: N/A;  . CORONARY ANGIOPLASTY  03/16/2003   stent to the RCA  & 2/18/2004stent mid circumflex  . ELBOW SURGERY    . HERNIA  REPAIR    . NASAL SINUS SURGERY    . PENILE PROSTHESIS IMPLANT  1995  . PILONIDAL CYST EXCISION    . TEE WITHOUT CARDIOVERSION N/A 04/24/2018   Procedure: TRANSESOPHAGEAL ECHOCARDIOGRAM (TEE);  Surgeon: Nelva Bush, MD;  Location: [facility];  Service: Cardiovascular;  Laterality: N/A;  . TONSILECTOMY, ADENOIDECTOMY, BILATERAL MYRINGOTOMY AND TUBES    . UMBILICAL HERNIA REPAIR      Medical History: Past Medical History:  Diagnosis Date  . AAA (abdominal aortic aneurysm) ([place])    a.  Status post endovascular repair at [facility] in 2016  . Anxiety   . Asthma   . Atrial flutter ([place])    a. diagnosed 7/19; b. CHADS2VASc => 5 (CHF, HTN, age x 1, DM, vascular disease); c. on Eliquis; d. s/p TEE/DCCV 8/19 with repeat DCCV 12/19  . Cancer (HCC)    melanoma L index finger   . Chronic diastolic CHF (congestive heart failure) ([place])   . COPD (chronic obstructive pulmonary disease) ([place])   . Coronary artery disease    a. LHC 5/10: left main 10%, mLAD-1 lesion 20%, mLAD-2 lesion  20%, D1 20%, mLCx-1 lesion 30%, mLCx-2 lesion 50%, OM1 20%, OM to 20%, LPL 20%, pRCA-1 lesion 20%, pRCA-2 lesion 20%.  EF 51% w/ mild inf HK  . Diabetes mellitus with complication ([place])   . Hyperlipidemia   . Hypertension   . Iliac artery aneurysm, bilateral ([place])   . Obstructive sleep apnea-hypopnea syndrome     Family History: Family History  Problem Relation Age of Onset  . Hypertension Mother   . Hyperlipidemia Mother   . Depression Mother   . Heart attack Father 5  . Heart disease Father   . Hypertension Father   . Hyperlipidemia Father     Review of Systems  Constitutional: Positive for chills. Negative for fatigue and unexpected weight change.  HENT: Positive for congestion, sinus pressure and sinus pain. Negative for postnasal drip, rhinorrhea, sneezing and sore throat.   Eyes: Negative for redness.  Respiratory: Positive for cough and wheezing. Negative for chest tightness and shortness of breath.   Cardiovascular: Negative for chest pain, palpitations and leg swelling.  Gastrointestinal: Negative for abdominal pain, constipation, diarrhea, nausea and vomiting.  Genitourinary: Negative for dysuria and frequency.  Musculoskeletal: Negative for arthralgias, back pain, joint swelling and neck pain.  Skin: Negative for rash.  Neurological: Negative for tremors and numbness.  Hematological: Negative for adenopathy. Does not bruise/bleed easily.  Psychiatric/Behavioral: Negative for behavioral problems (Depression), sleep disturbance and suicidal ideas. The patient is not nervous/anxious.      Vital Signs: BP 110/70   Temp 97.9 F (36.6 C)   Resp 16   Ht 5' 10"  (1.778 m)   Wt 242 lb 9.6 oz (110 kg)   BMI 34.81 kg/m    Physical Exam Vitals reviewed.  Constitutional:      Appearance: Normal appearance. He is obese.  HENT:     Right Ear: Tympanic membrane normal.     Left Ear: Tympanic membrane normal.     Nose: Congestion present.     Mouth/Throat:     Mouth:  Mucous membranes are moist.     Pharynx: Oropharynx is clear.  Eyes:     Pupils: Pupils are equal, round, and reactive to light.  Cardiovascular:     Rate and Rhythm: Normal rate and regular rhythm.     Pulses: Normal pulses.     Heart sounds: Normal heart sounds.  Pulmonary:  Effort: Pulmonary effort is normal.     Breath sounds: Examination of the right-lower field reveals wheezing. Examination of the left-lower field reveals wheezing. Wheezing present.  Abdominal:     General: Abdomen is flat.  Musculoskeletal:        General: Normal range of motion.     Cervical back: Normal range of motion.  Skin:    General: Skin is warm.  Neurological:     General: No focal deficit present.     Mental Status: He is alert and oriented to person, place, and time. Mental status is at baseline.  Psychiatric:        Mood and Affect: Mood normal.        Behavior: Behavior normal.        Thought Content: Thought content normal.        Judgment: Judgment normal.      LABS: Recent Results (from the past 2160 hour(s))  POCT HgB A1C     Status: Abnormal   Collection Time: 08/05/20 10:56 AM  Result Value Ref Range   Hemoglobin A1C 5.7 (A) 4.0 - 5.6 %   HbA1c POC (<> result, manual entry)     HbA1c, POC (prediabetic range)     HbA1c, POC (controlled diabetic range)    Comprehensive metabolic panel     Status: Abnormal   Collection Time: 08/23/20 10:58 AM  Result Value Ref Range   Glucose 116 (H) 65 - 99 mg/dL   BUN 13 8 - 27 mg/dL   Creatinine, Ser 1.04 0.76 - 1.27 mg/dL   GFR calc non Af Amer 73 >59 mL/min/1.73   GFR calc Af Amer 84 >59 mL/min/1.73    Comment: **In accordance with recommendations from the NKF-ASN Task force,**   [facility] is in the process of updating its eGFR calculation to the   2021 CKD-EPI creatinine equation that estimates kidney function   without a race variable.    BUN/Creatinine Ratio 13 10 - 24   Sodium 137 134 - 144 mmol/L   Potassium 4.5 3.5 - 5.2 mmol/L    Chloride 99 96 - 106 mmol/L   CO2 22 20 - 29 mmol/L   Calcium 10.1 8.6 - 10.2 mg/dL   Total Protein 7.6 6.0 - 8.5 g/dL   Albumin 3.9 3.8 - 4.8 g/dL   Globulin, Total 3.7 1.5 - 4.5 g/dL   Albumin/Globulin Ratio 1.1 (L) 1.2 - 2.2   Bilirubin Total 0.4 0.0 - 1.2 mg/dL   Alkaline Phosphatase 95 44 - 121 IU/L    Comment:               **Please note reference interval change**   AST 21 0 - 40 IU/L   ALT 11 0 - 44 IU/L  CBC     Status: Abnormal   Collection Time: 08/23/20 10:58 AM  Result Value Ref Range   WBC 7.6 3.4 - 10.8 x10E3/uL   RBC 3.64 (L) 4.14 - 5.80 x10E6/uL   Hemoglobin 11.6 (L) 13.0 - 17.7 g/dL   Hematocrit 34.5 (L) 37.5 - 51.0 %   MCV 95 79 - 97 fL   MCH 31.9 26.6 - 33.0 pg   MCHC 33.6 31.5 - 35.7 g/dL   RDW 15.8 (H) 11.6 - 15.4 %   Platelets 161 150 - 450 x10E3/uL  Lipid Panel With LDL/HDL Ratio     Status: None   Collection Time: 08/23/20 10:58 AM  Result Value Ref Range   Cholesterol, Total 122 100 - 199  mg/dL   Triglycerides 97 0 - 149 mg/dL   HDL 52 >39 mg/dL   VLDL Cholesterol Cal 18 5 - 40 mg/dL   LDL Chol Calc (NIH) 52 0 - 99 mg/dL   LDL/HDL Ratio 1.0 0.0 - 3.6 ratio    Comment:                                     LDL/HDL Ratio                                             Men  Women                               1/2 Avg.Risk  1.0    1.5                                   Avg.Risk  3.6    3.2                                2X Avg.Risk  6.2    5.0                                3X Avg.Risk  8.0    6.1   HCV Ab w Reflex to Quant PCR     Status: None   Collection Time: 08/23/20 10:58 AM  Result Value Ref Range   HCV Ab <0.1 0.0 - 0.9 s/co ratio  T4, free     Status: None   Collection Time: 08/23/20 10:58 AM  Result Value Ref Range   Free T4 0.84 0.82 - 1.77 ng/dL  TSH     Status: None   Collection Time: 08/23/20 10:58 AM  Result Value Ref Range   TSH 2.250 0.450 - 4.500 uIU/mL  PSA     Status: None   Collection Time: 08/23/20 10:58 AM  Result Value  Ref Range   Prostate Specific Ag, Serum 0.1 0.0 - 4.0 ng/mL    Comment: Roche ECLIA methodology. According to the American Urological Association, Serum PSA should decrease and remain at undetectable levels after radical prostatectomy. The AUA defines biochemical recurrence as an initial PSA value 0.2 ng/mL or greater followed by a subsequent confirmatory PSA value 0.2 ng/mL or greater. Values obtained with different assay methods or kits cannot be used interchangeably. Results cannot be interpreted as absolute evidence of the presence or absence of malignant disease.   Interpretation:     Status: None   Collection Time: 08/23/20 10:58 AM  Result Value Ref Range   HCV Interp 1: Comment     Comment: Negative Not infected with HCV, unless recent infection is suspected or other evidence exists to indicate HCV infection.   UA/M w/rflx Culture, Routine     Status: None   Collection Time: 08/29/20  9:40 AM   Specimen: Urine   Urine  Result Value Ref Range   Specific Gravity, UA 1.006 1.005 - 1.030   pH, UA 5.5 5.0 - 7.5   Color, UA Yellow Yellow  Appearance Ur Clear Clear   Leukocytes,UA Negative Negative   Protein,UA Negative Negative/Trace   Glucose, UA Negative Negative   Ketones, UA Negative Negative   RBC, UA Negative Negative   Bilirubin, UA Negative Negative   Urobilinogen, Ur 0.2 0.2 - 1.0 mg/dL   Nitrite, UA Negative Negative   Microscopic Examination Comment     Comment: Microscopic follows if indicated.   Microscopic Examination See below:     Comment: Microscopic was indicated and was performed.   Urinalysis Reflex Comment     Comment: This specimen will not reflex to a Urine Culture.  Microscopic Examination     Status: None   Collection Time: 08/29/20  9:40 AM   Urine  Result Value Ref Range   WBC, UA None seen 0 - 5 /hpf   RBC None seen 0 - 2 /hpf   Epithelial Cells (non renal) None seen 0 - 10 /hpf   Casts None seen None seen /lpf   Bacteria, UA None seen  None seen/Few   Assessment/Plan: 1. Encounter for routine adult health examination with abnormal findings Well appearing 69 year old male Routine labs reviewed Up to date on PHM  2. Chronic obstructive pulmonary disease with acute exacerbation (HCC) Treat acute exacerbation with 14 day course of azithromycin Continue with inhaler and nebulizer therapy - azithromycin (ZITHROMAX) 250 MG tablet; Take one tab po qd for 14 days  Dispense: 14 tablet; Refill: 0  3. Low hemoglobin Will review labs for possible underlying cause of anemia Denies acute blood loss--may need colonoscopy screening - Vitamin B12 - Folate - Iron and TIBC - Ferritin  4. Dysuria - UA/M w/rflx Culture, Routine - Microscopic Examination  General Counseling: Tome verbalizes understanding of the findings of todays visit and agrees with plan of treatment. I have discussed any further diagnostic evaluation that may be needed or ordered today. We also reviewed his medications today. he has been encouraged to call the office with any questions or concerns that should arise related to todays visit.    Counseling:    Orders Placed This Encounter  Procedures  . Microscopic Examination  . UA/M w/rflx Culture, Routine  . Vitamin B12  . Folate  . Iron and TIBC  . Ferritin    Meds ordered this encounter  Medications  . azithromycin (ZITHROMAX) 250 MG tablet    Sig: Take one tab po qd for 14 days    Dispense:  14 tablet    Refill:  0    Total time spent: 30 Minutes  Time spent includes review of chart, medications, test results, and follow up plan with the patient.   This patient was seen by Casey Burkitt AGNP-C Collaboration with Dr Lavera Guise as a part of collaborative care agreement   Tanna Furry. [facility] Internal Medicine

## 2020-08-30 LAB — MICROSCOPIC EXAMINATION
Bacteria, UA: NONE SEEN
Casts: NONE SEEN /lpf
Epithelial Cells (non renal): NONE SEEN /hpf (ref 0–10)
RBC, Urine: NONE SEEN /hpf (ref 0–2)
WBC, UA: NONE SEEN /hpf (ref 0–5)

## 2020-08-30 LAB — UA/M W/RFLX CULTURE, ROUTINE
Bilirubin, UA: NEGATIVE
Glucose, UA: NEGATIVE
Ketones, UA: NEGATIVE
Leukocytes,UA: NEGATIVE
Nitrite, UA: NEGATIVE
Protein,UA: NEGATIVE
RBC, UA: NEGATIVE
Specific Gravity, UA: 1.006 (ref 1.005–1.030)
Urobilinogen, Ur: 0.2 mg/dL (ref 0.2–1.0)
pH, UA: 5.5 (ref 5.0–7.5)

## 2020-08-31 ENCOUNTER — Other Ambulatory Visit: Payer: Self-pay | Admitting: Adult Health

## 2020-08-31 DIAGNOSIS — J440 Chronic obstructive pulmonary disease with acute lower respiratory infection: Secondary | ICD-10-CM

## 2020-09-01 ENCOUNTER — Encounter: Payer: Self-pay | Admitting: Hospice and Palliative Medicine

## 2020-09-01 ENCOUNTER — Other Ambulatory Visit: Payer: Self-pay

## 2020-09-01 NOTE — Patient Outreach (Signed)
[facility]) Care Management  [place]  09/01/2020   Lawrence Steele [birth year] [id number]  Subjective: Telephone call to patient for quarterly disease management support.  Patient reports he is doing pretty good but is dealing with a cold.  He is on antibiotics right now.  Discussed cold symptoms and signs of worsening.  Patient A1c was 5.7.  Encouraged patient to continue with great control of his sugars.  Patient to have eye exam in January. Patient to see cardiologist next month as well.  Patient uses [employer] and gets meals on wheels.   Objective:   Encounter Medications:  Outpatient Encounter Medications as of 09/01/2020  Medication Sig Note  . Accu-Chek Softclix Lancets lancets Use as instructed  Once a daily E11.65   . acetaminophen (TYLENOL) 500 MG tablet Take 1,000 mg by mouth every 6 (six) hours as needed for headache.   . albuterol (VENTOLIN HFA) 108 (90 Base) MCG/ACT inhaler Inhale 2 puffs into the lungs every 6 (six) hours as needed for wheezing or shortness of breath.   . allopurinol (ZYLOPRIM) 100 MG tablet Take 1 tablet (100 mg total) by mouth 2 (two) times daily.   Marland Kitchen apixaban (ELIQUIS) 5 MG TABS tablet Take 1 tablet (5 mg total) by mouth 2 (two) times daily.   Marland Kitchen atenolol (TENORMIN) 25 MG tablet Take 2 tablets (50 mg) in the morning and 1 tablet (25 mg) in the evening, (Patient taking differently: Take 25-50 mg by mouth 2 (two) times daily. Take 2 tablets (50 mg) in the morning and 1 tablet (25 mg) in the evening,)   . atorvastatin (LIPITOR) 20 MG tablet Take 1 tablet by mouth once daily   . azithromycin (ZITHROMAX) 250 MG tablet Take one tab po qd for 14 days   . Blood Glucose Monitoring Suppl (ACCU-CHEK AVIVA PLUS) w/Device KIT Use as directed   . calcium carbonate (TUMS EX) 750 MG chewable tablet Chew by mouth.   . cyclobenzaprine (FLEXERIL) 10 MG tablet Take 1 tablet (10 mg total) by mouth 2 (two) times daily at 10 AM and 5 PM.   .  diazepam (VALIUM) 5 MG tablet Take 1 tablet po one time 1.5 hours prior to scheduled procedure. May repeat dose in 1 hour for persistent anxiety.   . diclofenac sodium (VOLTAREN) 1 % GEL Apply 1 application topically 4 (four) times daily as needed (pain).    Marland Kitchen doxycycline (VIBRA-TABS) 100 MG tablet Take 1 tablet (100 mg total) by mouth daily.   Marland Kitchen dronedarone (MULTAQ) 400 MG tablet Take 1 tablet (400 mg total) by mouth 2 (two) times daily with a meal.   . escitalopram (LEXAPRO) 5 MG tablet Take 1 tablet (5 mg total) by mouth daily.   . Fluticasone-Salmeterol (ADVAIR) 250-50 MCG/DOSE AEPB Inhale 1 puff into the lungs 2 (two) times daily.   . furosemide (LASIX) 20 MG tablet TAKE 2 TABLETS BY MOUTH EVERY OTHER DAY ALTERNATING WITH 1 TABLET   . gabapentin (NEURONTIN) 400 MG capsule Decrease to TID for several days. If tolerated well, may decrease to twice daily.   Marland Kitchen glucose blood (ACCU-CHEK AVIVA PLUS) test strip 1 each by Other route as needed for other. Use as instructed   . hydroxypropyl methylcellulose / hypromellose (ISOPTO TEARS / GONIOVISC) 2.5 % ophthalmic solution Place 1 drop into both eyes 4 (four) times daily as needed (eye irritation (sand like feeling)).    Marland Kitchen ipratropium (ATROVENT) 0.02 % nebulizer solution Use every 6 hours for Shortness  of breath and wheezing and as needed.  Prescription is for 90 days (Patient taking differently: Take 0.5 mg by nebulization every 6 (six) hours as needed for wheezing or shortness of breath. Use every 6 hours for Shortness of breath and wheezing and as needed.  Prescription is for 90 days)   . ipratropium-albuterol (DUONEB) 0.5-2.5 (3) MG/3ML SOLN Inhale into the lungs.   Marland Kitchen loratadine (CLARITIN) 10 MG tablet Take 10 mg by mouth daily.   . meloxicam (MOBIC) 7.5 MG tablet Take 1 tablet (7.5 mg total) by mouth 2 (two) times daily.   . metFORMIN (GLUCOPHAGE) 500 MG tablet Take 0.5 tablets (250 mg total) by mouth 2 (two) times daily with a meal. Decreased dose  based on very good blood sugar control   . nitroGLYCERIN (NITROSTAT) 0.4 MG SL tablet Place 0.4 mg under the tongue every 5 (five) minutes x 3 doses as needed for chest pain.    Marland Kitchen nystatin (MYCOSTATIN) 100000 UNIT/ML suspension Take 5 mLs (500,000 Units total) by mouth 4 (four) times daily as needed (mouth/throat irritation.).   Marland Kitchen oxyCODONE-acetaminophen (PERCOCET) 10-325 MG tablet Take 1 tablet by mouth 4 (four) times daily.   . OXYGEN Inhale 2 L into the lungs continuous.    . Roflumilast (DALIRESP) 250 MCG TABS Take 250 mcg by mouth daily.   Marland Kitchen tiotropium (SPIRIVA) 18 MCG inhalation capsule Place 18 mcg into inhaler and inhale daily.  03/11/2020: Uses interchangeably with Spiriva  . traZODone (DESYREL) 100 MG tablet Take 1 tablet (100 mg total) by mouth at bedtime.   Marland Kitchen umeclidinium bromide (INCRUSE ELLIPTA) 62.5 MCG/INH AEPB Inhale 1 puff into the lungs daily.    No facility-administered encounter medications on file as of 09/01/2020.    Functional Status:  In your present state of health, do you have any difficulty performing the following activities: 09/01/2020 08/29/2020  Hearing? N N  Vision? N N  Difficulty concentrating or making decisions? N N  Walking or climbing stairs? N N  Dressing or bathing? N N  Doing errands, shopping? N [place] and eating ? N N  Using the Toilet? N N  In the past six months, have you accidently leaked urine? N N  Do you have problems with loss of bowel control? N N  Managing your Medications? N N  Managing your Finances? N N  Housekeeping or managing your Housekeeping? N N  Comment - -  Some recent data might be hidden    Fall/Depression Screening: Fall Risk  09/01/2020 08/29/2020 08/05/2020  Falls in the past year? 1 1 0  Comment - - -  Number falls in past yr: 0 0 0  Injury with Fall? 0 0 0  Comment - - -  Risk for fall due to : - - -  Follow up Falls prevention discussed Falls evaluation completed -   PHQ 2/9  Scores 09/01/2020 08/29/2020 08/05/2020 03/29/2020 03/09/2020 01/19/2020 12/31/2019  PHQ - 2 Score 0 0 0 0 0 0 0  PHQ- 9 Score - - - - - - -    Assessment: Patient continues to manage chronic conditions and sees physicians as scheduled.   Goals Addressed            This Visit's Progress   . THN-Make and Keep All Appointments       Timeframe:  Long-Range Goal Priority:  High Start Date:    09/01/20  Expected End Date: 12/15/20                      Follow Up Date 12/15/20   - keep a calendar with appointment dates - use public transportation    Why is this important?    Part of staying healthy is seeing the doctor for follow-up care.   If you forget your appointments, there are some things you can do to stay on track.    Notes:     . THN-Monitor and Manage My Blood Sugar-Diabetes Type 2       Timeframe:  Long-Range Goal Priority:  High Start Date:  09/01/20                           Expected End Date:    12/15/20                   Follow Up Date 12/15/20   - check blood sugar at prescribed times - take the blood sugar log to all doctor visits    Why is this important?    Checking your blood sugar at home helps to keep it from getting very high or very low.   Writing the results in a diary or log helps the doctor know how to care for you.   Your blood sugar log should have the time, date and the results.   Also, write down the amount of insulin or other medicine that you take.   Other information, like what you ate, exercise done and how you were feeling, will also be helpful.     Notes:     . THN-Obtain Eye Exam-Diabetes Type 2       Timeframe:  Short-Term Goal Priority:  High Start Date:   09/01/20                          Expected End Date:   12/15/20                    Follow Up Date 12/15/20   - keep appointment with eye doctor    Why is this important?    Eye check-ups are important when you have diabetes.   Vision loss can be  prevented.    Notes:     . THN-Prevent Falls and Injury       Timeframe:  Short-Term Goal Priority:  High Start Date:     09/01/20                        Expected End Date:  12/15/20                     Follow Up Date 12/15/20   - learn how to get back up if I fall - use a nonslip pad with throw rugs, or remove them completely - wear my glasses and/or hearing aid    Why is this important?    Most falls happen when it is hard for you to walk safely. Your balance may be off because of an illness. You may have pain in your knees, hip or other joints.   You may be overly tired or taking medicines that make you sleepy. You may not be able to see or hear clearly.   Falls can lead to broken bones, bruises or other injuries.  There are things you can do to help prevent falling.     Notes:        Plan: RN CM will contact patient again in the month of March and patient agreeable.   Follow-up:  Patient agrees to Care Plan and Follow-up.   Jone Baseman, RN, MSN [place] Management Care Management Coordinator Direct Line [phone] Cell [phone] Toll Free: [phone]  Fax: [phone]

## 2020-09-01 NOTE — Patient Instructions (Signed)
Goals     THN-Make and Keep All Appointments     Timeframe:  Long-Range Goal Priority:  High Start Date:    09/01/20                         Expected End Date: 12/15/20                      Follow Up Date 12/15/20   - keep a calendar with appointment dates - use public transportation    Why is this important?    Part of staying healthy is seeing the doctor for follow-up care.   If you forget your appointments, there are some things you can do to stay on track.    Notes:      THN-Monitor and Manage My Blood Sugar-Diabetes Type 2     Timeframe:  Long-Range Goal Priority:  High Start Date:  09/01/20                           Expected End Date:    12/15/20                   Follow Up Date 12/15/20   - check blood sugar at prescribed times - take the blood sugar log to all doctor visits    Why is this important?    Checking your blood sugar at home helps to keep it from getting very high or very low.   Writing the results in a diary or log helps the doctor know how to care for you.   Your blood sugar log should have the time, date and the results.   Also, write down the amount of insulin or other medicine that you take.   Other information, like what you ate, exercise done and how you were feeling, will also be helpful.     Notes:      THN-Obtain Eye Exam-Diabetes Type 2     Timeframe:  Short-Term Goal Priority:  High Start Date:   09/01/20                          Expected End Date:   12/15/20                    Follow Up Date 12/15/20   - keep appointment with eye doctor    Why is this important?    Eye check-ups are important when you have diabetes.   Vision loss can be prevented.    Notes:      THN-Prevent Falls and Injury     Timeframe:  Short-Term Goal Priority:  High Start Date:     09/01/20                        Expected End Date:  12/15/20                     Follow Up Date 12/15/20   - learn how to get back up if I fall - use a nonslip  pad with throw rugs, or remove them completely - wear my glasses and/or hearing aid    Why is this important?    Most falls happen when it is hard for you to walk safely. Your balance may be off because of an illness. You may have pain  in your knees, hip or other joints.   You may be overly tired or taking medicines that make you sleepy. You may not be able to see or hear clearly.   Falls can lead to broken bones, bruises or other injuries.   There are things you can do to help prevent falling.     Notes:

## 2020-09-03 DIAGNOSIS — J449 Chronic obstructive pulmonary disease, unspecified: Secondary | ICD-10-CM | POA: Insufficient documentation

## 2020-09-04 IMAGING — CR DG CHEST 2V
1 series · 2 of 2 positions shown · non-contrast
Comparison: CXR 04/11/2018

CLINICAL DATA: Dry cough and dyspnea x5 days

EXAM:
CHEST - 2 VIEW

[Series 1: dg chest 2 view · 0.14mm/px · 2 of 2 slices shown]
[im 1/2]
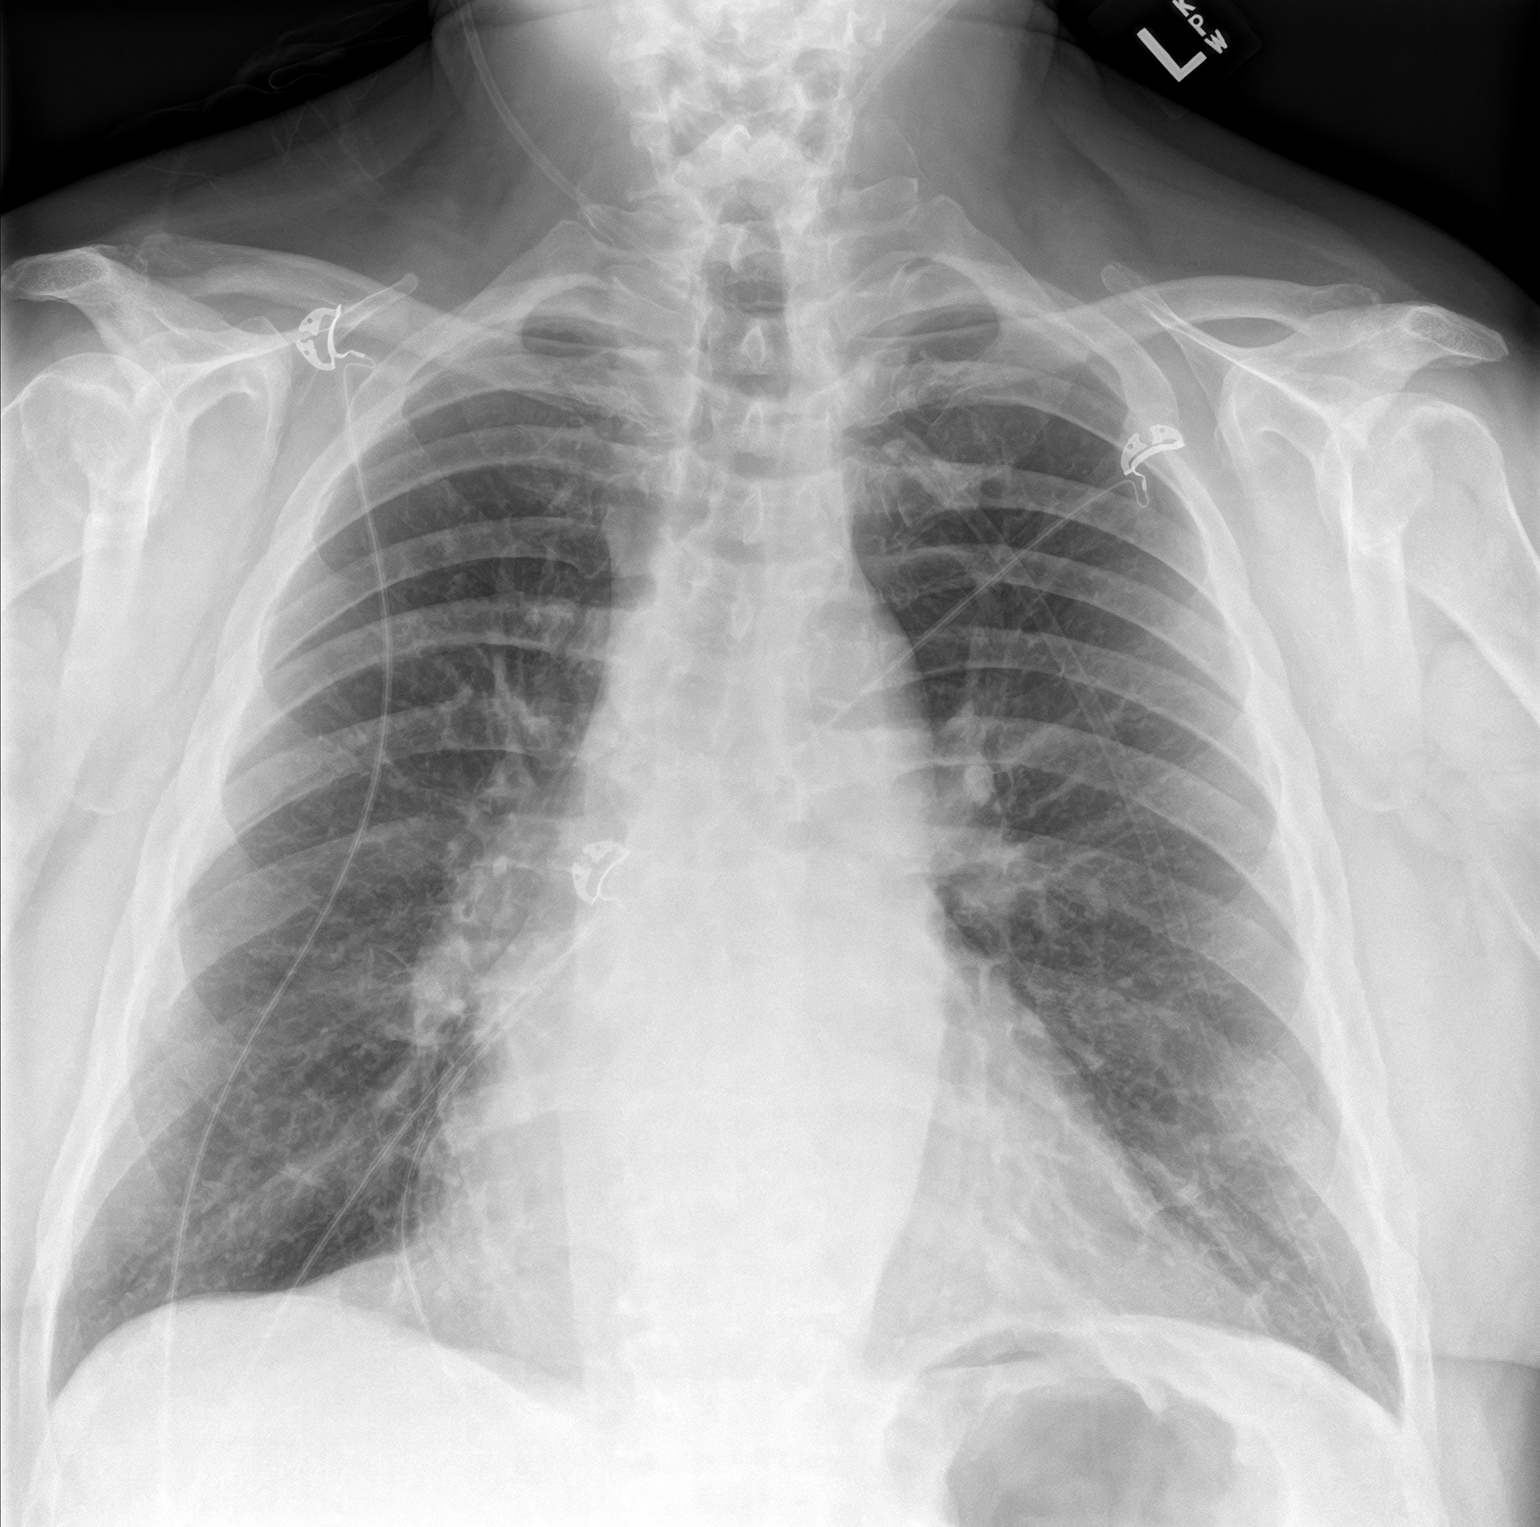
[im 2/2]
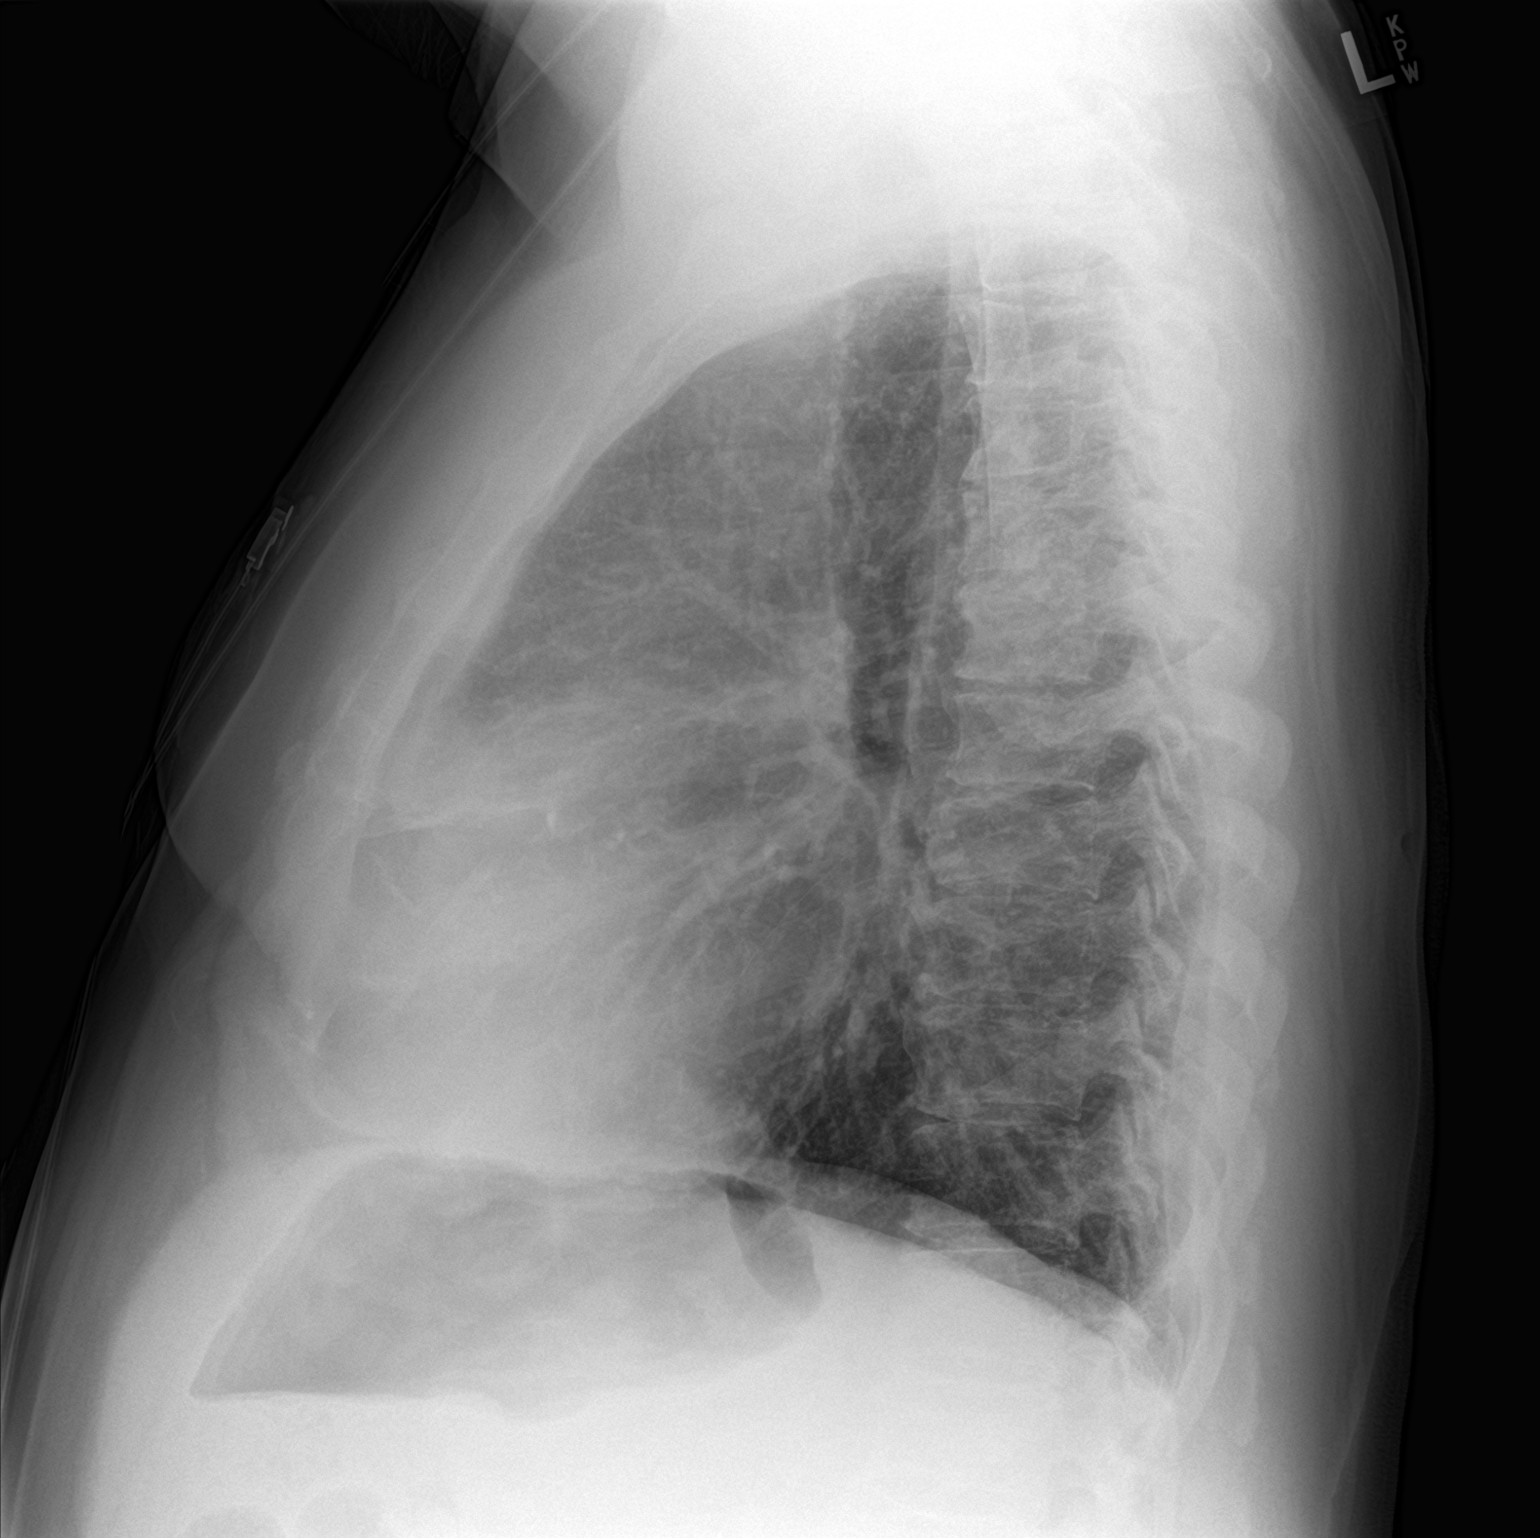

[2 of 2 positions shown; findings below may reference images not displayed]

FINDINGS: Stable cardiomegaly with mild uncoiling of the thoracic aorta. No
thoracic aortic aneurysm. There is atelectasis at the left lung
base. Remote left-sided rib fractures are noted. No acute osseous
abnormality is seen. Mild degenerative change is noted along the
dorsal spine. Pulmonary vasculature is unremarkable.
IMPRESSION: No active cardiopulmonary disease.  Stable mild cardiomegaly.

## 2020-09-07 NOTE — Telephone Encounter (Signed)
Patient calling to make sure samples are still available.   He will come to office to get them at his next appt or if he runs out sooner.    Please leave at front desk

## 2020-09-10 DIAGNOSIS — J449 Chronic obstructive pulmonary disease, unspecified: Secondary | ICD-10-CM | POA: Diagnosis not present

## 2020-09-21 DIAGNOSIS — E119 Type 2 diabetes mellitus without complications: Secondary | ICD-10-CM | POA: Diagnosis not present

## 2020-09-21 DIAGNOSIS — H2513 Age-related nuclear cataract, bilateral: Secondary | ICD-10-CM | POA: Diagnosis not present

## 2020-09-23 DIAGNOSIS — Z0100 Encounter for examination of eyes and vision without abnormal findings: Secondary | ICD-10-CM | POA: Diagnosis not present

## 2020-09-28 DIAGNOSIS — M47896 Other spondylosis, lumbar region: Secondary | ICD-10-CM | POA: Diagnosis not present

## 2020-09-28 DIAGNOSIS — G894 Chronic pain syndrome: Secondary | ICD-10-CM | POA: Diagnosis not present

## 2020-09-28 DIAGNOSIS — Z79899 Other long term (current) drug therapy: Secondary | ICD-10-CM | POA: Diagnosis not present

## 2020-09-30 ENCOUNTER — Ambulatory Visit: Payer: Medicare HMO | Admitting: Cardiovascular Disease

## 2020-10-03 ENCOUNTER — Ambulatory Visit: Payer: Medicare HMO | Admitting: Cardiovascular Disease

## 2020-10-05 ENCOUNTER — Other Ambulatory Visit: Payer: Self-pay

## 2020-10-05 MED ORDER — CYCLOBENZAPRINE HCL 10 MG PO TABS: 10.0000 mg | ORAL_TABLET | Freq: Two times a day (BID) | ORAL | 2 refills | Status: DC

## 2020-10-06 DIAGNOSIS — D529 Folate deficiency anemia, unspecified: Secondary | ICD-10-CM | POA: Diagnosis not present

## 2020-10-06 DIAGNOSIS — D649 Anemia, unspecified: Secondary | ICD-10-CM | POA: Diagnosis not present

## 2020-10-06 DIAGNOSIS — E538 Deficiency of other specified B group vitamins: Secondary | ICD-10-CM | POA: Diagnosis not present

## 2020-10-07 ENCOUNTER — Other Ambulatory Visit: Payer: Self-pay | Admitting: Hospice and Palliative Medicine

## 2020-10-07 LAB — FOLATE: Folate: 5.9 ng/mL (ref >3.0)

## 2020-10-07 LAB — IRON AND TIBC
Iron Saturation: 9 % — CL (ref 15–55)
Iron: 32 ug/dL — ABNORMAL LOW (ref 38–169)
Total Iron Binding Capacity: 344 ug/dL (ref 250–450)
UIBC: 312 ug/dL (ref 111–343)

## 2020-10-07 LAB — VITAMIN B12: Vitamin B-12: 343 pg/mL (ref 232–1245)

## 2020-10-07 LAB — FERRITIN: Ferritin: 20 ng/mL — ABNORMAL LOW (ref 30–400)

## 2020-10-07 MED ORDER — FERRALET 90 90-1 MG PO TABS: 1.0000 mg | ORAL_TABLET | Freq: Every day | ORAL | 3 refills | Status: AC

## 2020-10-07 NOTE — Progress Notes (Signed)
Please call [name] and let him know I have prescribed an iron supplement he needs to start taking daily based on his labs. Thanks.

## 2020-10-11 ENCOUNTER — Telehealth: Payer: Self-pay

## 2020-10-11 DIAGNOSIS — J449 Chronic obstructive pulmonary disease, unspecified: Secondary | ICD-10-CM | POA: Diagnosis not present

## 2020-10-13 NOTE — Telephone Encounter (Signed)
He can take iron over the counter.

## 2020-10-19 NOTE — Telephone Encounter (Signed)
Pt advised by desha 

## 2020-10-22 NOTE — Progress Notes (Unsigned)
Cardiology Office Note  Date:  10/24/2020   ID:  ORANGE HILLIGOSS, DOB July 12, 1951, MRN [id number]  PCP:  Ronnell Freshwater, NP   Chief Complaint  Patient presents with  . Follow-up    Follow up. Medications verbally reviewed with patient.     HPI:  Mr. Carvalho is a 70 year old gentleman with hx of   COPD,  coronary artery disease, catheterization 2000  50% left circumflex disease diabetes type 2, hemoglobin A1c 9.2 ETOH, vodka long history of smoking, continues to smoke diastolic CHF,  hypertension,  hyperlipidemia,  Morbid obesity Followed at [facility] for a moderately large abdominal aortic aneurysm, dilated iliac arteries. S/p EVAR Ascending aorta <4 cm cough syncope, none in several years  chronic oxygen at night, not on exertion Statin myalgias who presents for follow-up of his coronary artery disease and atrial flutter  Last seen 01/2020  01/2020 was in [facility] Now in atrial fib/flutter Occasionally symptomatic especially when doing something heavy like raking, heavy manual labor During these occasions noticed fast heart rate  For day-to-day activities, relatively asymptomatic  Prior history of dizziness on high-dose atenolol Has been taking 12.5 twice daily with no symptoms  Prior history of falls, none recently  walks with a cane Previously worked with PT  Has oxygen, uses it sometimes  Continues to take Lasix, denies significant leg edema  Discussed prior cardioversions over the past several years Cardioversion on 04/24/2018 for atrial flutter  Multiple cardioversions in 2020, July 10/2019: cardioversion Fib/flutter  Labs reviewed HBA1C 5.8  EKG personally reviewed by myself on todays visit Atrial flutter regular rate 84 bpm no significant ST-T wave changes  Other past medical history reviewed  stress test February 2018 No significant ischemia, inferoapical fixed perfusion defect small size  ejection fraction 62% Cardiac catheterization was offered if  symptoms got worse  previous falls, low back pain, neuropathy Had MRI lumbar spine with mild degeneration of disks,  Presented to chest pain August 14, 2017  Hospital records reviewed with the patient in detail Notes indicating that he was drinking heavy vodka Diagnosed with alcohol intoxication level was 383  Previous cardiac catheterization from 2010 reviewed with him showing diffuse mild disease, 50% mid left circumflex, 20% disease in the other vessels   CT ABd 09/2015 at [facility]  completed AAA endovascular stent at [facility] reports -- Stable aortobiiliac endograft with decreased excluded aneurysm size. The previously identified type II endoleak is not well seen on today's study. -- Stable bilateral common iliac artery aneurysms. -- Stable anterior abdominal wall hernia containing nonobstructed small bowel, anterior bladder, and penile prosthesis reservoir.   Was on lipitor for years, just decided to stop this on his own, possibly having stomach issues with Lipitor   previously reported having problems on Crestor , myalgias Myalgias on simvastatin  long history of shortness of breath, on several inhalers  He has been seen at [facility] for a moderately large abdominal aortic aneurysm as well as dilated iliac arteries.   cardiac catheterization 01/18/2009.  Notes indicate he has proximal 20% RCA disease, 10% left main disease, 30 follow by 50% mid left circumflex disease, 20% OM1 disease, 20% OM 2 disease, 20% disease of a ramus branch, 20% mid LAD disease and 20% D1 disease. No intervention was performed at that time He does report a cardiac catheterization prior to that. No further cardiac catheterization or stress testing since 2010    PMH:   has a past medical history of AAA (abdominal aortic aneurysm) ([place]), Anxiety, Asthma,  Atrial flutter ([place]), Cancer ([place]), Chronic diastolic CHF (congestive heart failure) ([place]), COPD (chronic obstructive pulmonary disease) ([place]), Coronary artery  disease, Diabetes mellitus with complication ([place]), Hyperlipidemia, Hypertension, Iliac artery aneurysm, bilateral ([place]), and Obstructive sleep apnea-hypopnea syndrome.  PSH:    Past Surgical History:  Procedure Laterality Date  . ABDOMINAL AORTIC ANEURYSM REPAIR  03/06/2015   [place]    . AMPUTATION Left    partial amputation  . APPENDECTOMY    . CARDIAC CATHETERIZATION  2010,03/16/2003,11/04/2002  . CARDIOVERSION N/A 04/24/2018   Procedure: CARDIOVERSION;  Surgeon: Nelva Bush, MD;  Location: [place] [facility];  Service: Cardiovascular;  Laterality: N/A;  . CARDIOVERSION N/A 09/05/2018   Procedure: CARDIOVERSION;  Surgeon: Minna Merritts, MD;  Location: [facility];  Service: Cardiovascular;  Laterality: N/A;  . CARDIOVERSION N/A 11/24/2018   Procedure: CARDIOVERSION (CATH LAB);  Surgeon: Wellington Hampshire, MD;  Location: [place] [facility];  Service: Cardiovascular;  Laterality: N/A;  . CARDIOVERSION N/A 03/18/2019   Procedure: CARDIOVERSION;  Surgeon: Minna Merritts, MD;  Location: [place] [facility];  Service: Cardiovascular;  Laterality: N/A;  . CARDIOVERSION N/A 11/13/2019   Procedure: CARDIOVERSION;  Surgeon: Minna Merritts, MD;  Location: [facility];  Service: Cardiovascular;  Laterality: N/A;  . CORONARY ANGIOPLASTY  03/16/2003   stent to the RCA  & 2/18/2004stent mid circumflex  . ELBOW SURGERY    . HERNIA REPAIR    . NASAL SINUS SURGERY    . PENILE PROSTHESIS IMPLANT  1995  . PILONIDAL CYST EXCISION    . TEE WITHOUT CARDIOVERSION N/A 04/24/2018   Procedure: TRANSESOPHAGEAL ECHOCARDIOGRAM (TEE);  Surgeon: Nelva Bush, MD;  Location: [facility];  Service: Cardiovascular;  Laterality: N/A;  . TONSILECTOMY, ADENOIDECTOMY, BILATERAL MYRINGOTOMY AND TUBES    . UMBILICAL HERNIA REPAIR      Current Outpatient Medications  Medication Sig Dispense Refill  . Accu-Chek Softclix Lancets lancets Use as instructed  Once a daily E11.65 100 each 12  . acetaminophen (TYLENOL) 500 MG tablet  Take 1,000 mg by mouth every 6 (six) hours as needed for headache.    . Aclidinium Bromide 400 MCG/ACT AEPB Inhale 400 mg into the lungs 2 (two) times daily.    Marland Kitchen albuterol (VENTOLIN HFA) 108 (90 Base) MCG/ACT inhaler Inhale 2 puffs into the lungs every 6 (six) hours as needed for wheezing or shortness of breath. 54 each 1  . allopurinol (ZYLOPRIM) 100 MG tablet Take 1 tablet (100 mg total) by mouth 2 (two) times daily. 180 tablet 1  . apixaban (ELIQUIS) 5 MG TABS tablet Take 1 tablet (5 mg total) by mouth 2 (two) times daily. 180 tablet 3  . atenolol (TENORMIN) 25 MG tablet Take 2 tablets (50 mg) in the morning and 1 tablet (25 mg) in the evening, (Patient taking differently: Take 25-50 mg by mouth 2 (two) times daily. Take 2 tablets (50 mg) in the morning and 1 tablet (25 mg) in the evening,) 270 tablet 3  . atorvastatin (LIPITOR) 20 MG tablet Take 1 tablet by mouth once daily 90 tablet 0  . Blood Glucose Monitoring Suppl (ACCU-CHEK AVIVA PLUS) w/Device KIT Use as directed 1 kit 0  . calcium carbonate (TUMS EX) 750 MG chewable tablet Chew by mouth.    . cyclobenzaprine (FLEXERIL) 10 MG tablet Take 1 tablet (10 mg total) by mouth 2 (two) times daily at 10 AM and 5 PM. 60 tablet 2  . diclofenac sodium (VOLTAREN) 1 % GEL Apply 1 application topically 4 (  four) times daily as needed (pain).     Marland Kitchen doxycycline (VIBRA-TABS) 100 MG tablet Take 1 tablet (100 mg total) by mouth daily. 30 tablet 3  . dronedarone (MULTAQ) 400 MG tablet Take 1 tablet (400 mg total) by mouth 2 (two) times daily with a meal. 180 tablet 3  . escitalopram (LEXAPRO) 5 MG tablet Take 1 tablet (5 mg total) by mouth daily. 30 tablet 3  . Fe Cbn-Fe Gluc-FA-B12-C-DSS (FERRALET 90) 90-1 MG TABS Take 1 mg by mouth daily. 90 tablet 3  . Fluticasone-Salmeterol (ADVAIR) 250-50 MCG/DOSE AEPB Inhale 1 puff into the lungs 2 (two) times daily. 60 each 4  . furosemide (LASIX) 20 MG tablet TAKE 2 TABLETS BY MOUTH EVERY OTHER DAY ALTERNATING WITH 1  TABLET 135 tablet 2  . gabapentin (NEURONTIN) 400 MG capsule Decrease to TID for several days. If tolerated well, may decrease to twice daily. 90 capsule 0  . glucose blood (ACCU-CHEK AVIVA PLUS) test strip 1 each by Other route as needed for other. Use as instructed 100 each 3  . hydroxypropyl methylcellulose / hypromellose (ISOPTO TEARS / GONIOVISC) 2.5 % ophthalmic solution Place 1 drop into both eyes 4 (four) times daily as needed (eye irritation (sand like feeling)).     Marland Kitchen ipratropium (ATROVENT) 0.02 % nebulizer solution Use every 6 hours for Shortness of breath and wheezing and as needed.  Prescription is for 90 days (Patient taking differently: Take 0.5 mg by nebulization every 6 (six) hours as needed for wheezing or shortness of breath. Use every 6 hours for Shortness of breath and wheezing and as needed.  Prescription is for 90 days) 900 mL 1  . ipratropium-albuterol (DUONEB) 0.5-2.5 (3) MG/3ML SOLN Inhale into the lungs.    Marland Kitchen loratadine (CLARITIN) 10 MG tablet Take 10 mg by mouth daily.    . meloxicam (MOBIC) 7.5 MG tablet Take 1 tablet (7.5 mg total) by mouth 2 (two) times daily. 60 tablet 3  . metFORMIN (GLUCOPHAGE) 500 MG tablet Take 0.5 tablets (250 mg total) by mouth 2 (two) times daily with a meal. Decreased dose based on very good blood sugar control 180 tablet 1  . nitroGLYCERIN (NITROSTAT) 0.4 MG SL tablet Place 0.4 mg under the tongue every 5 (five) minutes x 3 doses as needed for chest pain.     Marland Kitchen nystatin (MYCOSTATIN) 100000 UNIT/ML suspension Take 5 mLs (500,000 Units total) by mouth 4 (four) times daily as needed (mouth/throat irritation.). 200 mL 1  . oxyCODONE-acetaminophen (PERCOCET) 10-325 MG tablet Take 1 tablet by mouth 4 (four) times daily.    . OXYGEN Inhale 2 L into the lungs continuous.     . Roflumilast (DALIRESP) 250 MCG TABS Take 250 mcg by mouth daily. 31 tablet 4  . tiotropium (SPIRIVA) 18 MCG inhalation capsule Place 18 mcg into inhaler and inhale daily.     .  traMADol (ULTRAM) 50 MG tablet tramadol 50 mg tablet    . traZODone (DESYREL) 100 MG tablet Take 1 tablet (100 mg total) by mouth at bedtime. 90 tablet 1  . umeclidinium bromide (INCRUSE ELLIPTA) 62.5 MCG/INH AEPB Inhale 1 puff into the lungs daily. 1 each 1   No current facility-administered medications for this visit.     Allergies:   Patient has no known allergies.   Social History:  The patient  reports that he has been smoking cigarettes. He has a 11.25 pack-year smoking history. He has quit using smokeless tobacco.  His smokeless tobacco use included chew.  He reports previous alcohol use. He reports that he does not use drugs.   Family History:   family history includes Depression in his mother; Heart attack (age of onset: 31) in his father; Heart disease in his father; Hyperlipidemia in his father and mother; Hypertension in his father and mother.    Review of Systems: Review of Systems  Constitutional: Negative.   Respiratory: Positive for shortness of breath.   Cardiovascular: Negative.   Gastrointestinal: Negative.   Musculoskeletal: Negative.        Gait instability  Neurological: Negative.   Psychiatric/Behavioral: Negative.   All other systems reviewed and are negative.    PHYSICAL EXAM: VS:  BP 98/60 (BP Location: Left Arm, Patient Position: Sitting, Cuff Size: Normal)   Pulse 84   Ht 5' 10" (1.778 m)   Wt 240 lb (108.9 kg)   SpO2 98% Comment: 1 Liter of Oxygen  BMI 34.44 kg/m  , BMI Body mass index is 34.44 kg/m. Constitutional:  oriented to person, place, and time. No distress.  HENT:  Head: Grossly normal Eyes:  no discharge. No scleral icterus.  Neck: No JVD, no carotid bruits  Cardiovascular: Regular rate and rhythm, no murmurs appreciated Pulmonary/Chest: Coarse breath sounds bilaterally with Rales Abdominal: Soft.  no distension.  no tenderness.  Musculoskeletal: Normal range of motion Neurological:  normal muscle tone. Coordination normal. No  atrophy Skin: Skin warm and dry Psychiatric: normal affect, pleasant  Recent Labs: 08/23/2020: ALT 11; BUN 13; Creatinine, Ser 1.04; Hemoglobin 11.6; Platelets 161; Potassium 4.5; Sodium 137; TSH 2.250    Lipid Panel Lab Results  Component Value Date   CHOL 122 08/23/2020   HDL 52 08/23/2020   LDLCALC 52 08/23/2020   TRIG 97 08/23/2020    Wt Readings from Last 3 Encounters:  10/24/20 240 lb (108.9 kg)  08/29/20 242 lb 9.6 oz (110 kg)  08/05/20 248 lb 9.6 oz (112.8 kg)     ASSESSMENT AND PLAN:  Coronary artery disease involving native coronary artery of native heart with angina pectoris with documented spasm (HCC) - Currently with no symptoms of angina. No further workup at this time. Continue current medication regimen. Smoking cessation recommended Continue Lipitor, numbers at goal  Atrial flutter Will stay on atenolol, continue Eliquis Recommend he stop the Multaq He is declining cardioversion at this time, I would agree he has had cardioversions 2019, 2020, 2021 Asymptomatic unless heart rate runs high If needed could pursue cardioversion/ablation, this was discussed with him He will continue atenolol as tolerated for rate control, limited by hypotension May need midodrine for dizziness  Hypotension Low blood pressure, on atenolol 12 twice daily If needed may need midodrine for orthostasis  Severe COPD, chronic respiratory distress/centrilobular emphysema Long history of smoking, followed by pulmonary, on nebulizers and inhalers  Morbid obesity (HCC) Chronic back pain, joint pain  Weight with slow trend downward, To monitor blood pressure with weight loss  Smoker Long smoking history,COPD On inhalers and nebulizers   Total encounter time more than 35 minutes  Greater than 50% was spent in counseling and coordination of care with the patient    Orders Placed This Encounter  Procedures  . EKG 12-Lead     Signed, Esmond Plants, M.D., Ph.D. 10/24/2020   [place], [place]

## 2020-10-24 ENCOUNTER — Other Ambulatory Visit: Payer: Self-pay

## 2020-10-24 ENCOUNTER — Ambulatory Visit: Payer: Medicare HMO | Admitting: Cardiovascular Disease

## 2020-10-24 ENCOUNTER — Encounter: Payer: Self-pay | Admitting: Cardiovascular Disease

## 2020-10-24 VITALS — BP 98/60 | HR 84 | Ht 70.0 in | Wt 240.0 lb

## 2020-10-24 DIAGNOSIS — M791 Myalgia, unspecified site: Secondary | ICD-10-CM | POA: Diagnosis not present

## 2020-10-24 DIAGNOSIS — G4733 Obstructive sleep apnea (adult) (pediatric): Secondary | ICD-10-CM | POA: Diagnosis not present

## 2020-10-24 DIAGNOSIS — T466X5A Adverse effect of antihyperlipidemic and antiarteriosclerotic drugs, initial encounter: Secondary | ICD-10-CM | POA: Diagnosis not present

## 2020-10-24 DIAGNOSIS — I25118 Atherosclerotic heart disease of native coronary artery with other forms of angina pectoris: Secondary | ICD-10-CM

## 2020-10-24 DIAGNOSIS — J449 Chronic obstructive pulmonary disease, unspecified: Secondary | ICD-10-CM

## 2020-10-24 DIAGNOSIS — I723 Aneurysm of iliac artery: Secondary | ICD-10-CM | POA: Diagnosis not present

## 2020-10-24 DIAGNOSIS — I4892 Unspecified atrial flutter: Secondary | ICD-10-CM

## 2020-10-24 DIAGNOSIS — I714 Abdominal aortic aneurysm, without rupture, unspecified: Secondary | ICD-10-CM

## 2020-10-24 DIAGNOSIS — E782 Mixed hyperlipidemia: Secondary | ICD-10-CM

## 2020-10-24 NOTE — Patient Instructions (Addendum)
Medication Instructions:  Stop taking the Dronedarone (Multaq)   Lab work: No new labs needed  Testing/Procedures: No new testing needed   Follow-Up:   . You will need a follow up appointment in 6 months  . Providers on your designated Care Team:   . Murray Hodgkins, NP . Christell Faith, PA-C . Marrianne Mood, PA-C

## 2020-10-30 ENCOUNTER — Other Ambulatory Visit: Payer: Self-pay | Admitting: Physician Assistant

## 2020-10-30 ENCOUNTER — Other Ambulatory Visit: Payer: Self-pay | Admitting: Cardiovascular Disease

## 2020-10-31 ENCOUNTER — Ambulatory Visit: Payer: Medicare HMO | Admitting: Hospice and Palliative Medicine

## 2020-10-31 ENCOUNTER — Encounter: Payer: Self-pay | Admitting: Hospice and Palliative Medicine

## 2020-10-31 VITALS — BP 112/56 | HR 114 | Temp 97.0°F | Resp 16 | Ht 70.0 in | Wt 243.0 lb

## 2020-10-31 DIAGNOSIS — J449 Chronic obstructive pulmonary disease, unspecified: Secondary | ICD-10-CM | POA: Diagnosis not present

## 2020-10-31 DIAGNOSIS — Z9981 Dependence on supplemental oxygen: Secondary | ICD-10-CM | POA: Diagnosis not present

## 2020-10-31 DIAGNOSIS — I482 Chronic atrial fibrillation, unspecified: Secondary | ICD-10-CM

## 2020-10-31 DIAGNOSIS — Z9989 Dependence on other enabling machines and devices: Secondary | ICD-10-CM

## 2020-10-31 DIAGNOSIS — G4733 Obstructive sleep apnea (adult) (pediatric): Secondary | ICD-10-CM | POA: Diagnosis not present

## 2020-10-31 NOTE — Patient Instructions (Signed)

## 2020-10-31 NOTE — Progress Notes (Signed)
[facility] [place], [place] [address]  Pulmonary Sleep Medicine   Office Visit Note  Patient Name: Lawrence Steele DOB: 09/16/1951 MRN [id number]  Date of Service: 10/31/2020  Complaints/HPI: Patient is here for routine pulmonary follow-up Breathing remains stable--continues to wear 1-2LPM via [place] supplemental oxygen at all times Wears CPAP nightly for OSA-no documentation of recent CPAP download [facility] pharmacist is assisting him in getting inhalers that are covered by his insurance--Spiriva is no longer covered and wanting him to switch to Sherrlyn Hock is not wanting to get a prescription for this until he samples this inhaler Currently still has Spiriva from previous prescriptions, has tried Incruse but feels it does not work as well as Spiriva No recent exacerbations or hospitalizations PFT July 2021-moderate restrictive lung disease with reduced DLCO CXR 2020--hyperinflation, consistent with COPD Continues to smoke about 4-5 cigarettes per day Followed by cardiology for CAD as well as Atrial Flutter, anticoagulated with Eliquis--declined cardioversion, continued on atenolol--closely monitor hypotension--possibly benefit from midodrine  ROS  General: (-) fever, (-) chills, (-) night sweats, (-) weakness Skin: (-) rashes, (-) itching,. Eyes: (-) visual changes, (-) redness, (-) itching. Nose and Sinuses: (-) nasal stuffiness or itchiness, (-) postnasal drip, (-) nosebleeds, (-) sinus trouble. Mouth and Throat: (-) sore throat, (-) hoarseness. Neck: (-) swollen glands, (-) enlarged thyroid, (-) neck pain. Respiratory: - cough, (-) bloody sputum, + shortness of breath, + wheezing. Cardiovascular: - ankle swelling, (-) chest pain. Lymphatic: (-) lymph node enlargement. Neurologic: (-) numbness, (-) tingling. Psychiatric: (-) anxiety, (-) depression   Current Medication: Outpatient Encounter Medications as of 10/31/2020  Medication Sig Note  . Accu-Chek  Softclix Lancets lancets Use as instructed  Once a daily E11.65   . acetaminophen (TYLENOL) 500 MG tablet Take 1,000 mg by mouth every 6 (six) hours as needed for headache.   . Aclidinium Bromide 400 MCG/ACT AEPB Inhale 400 mg into the lungs 2 (two) times daily.   Marland Kitchen albuterol (VENTOLIN HFA) 108 (90 Base) MCG/ACT inhaler Inhale 2 puffs into the lungs every 6 (six) hours as needed for wheezing or shortness of breath.   . allopurinol (ZYLOPRIM) 100 MG tablet Take 1 tablet (100 mg total) by mouth 2 (two) times daily.   Marland Kitchen apixaban (ELIQUIS) 5 MG TABS tablet Take 1 tablet (5 mg total) by mouth 2 (two) times daily.   Marland Kitchen atenolol (TENORMIN) 25 MG tablet Take 2 tablets (50 mg) in the morning and 1 tablet (25 mg) in the evening, (Patient taking differently: Take 25-50 mg by mouth 2 (two) times daily. Take 2 tablets (50 mg) in the morning and 1 tablet (25 mg) in the evening,)   . Blood Glucose Monitoring Suppl (ACCU-CHEK AVIVA PLUS) w/Device KIT Use as directed   . calcium carbonate (TUMS EX) 750 MG chewable tablet Chew by mouth.   . cyclobenzaprine (FLEXERIL) 10 MG tablet Take 1 tablet (10 mg total) by mouth 2 (two) times daily at 10 AM and 5 PM.   . diclofenac sodium (VOLTAREN) 1 % GEL Apply 1 application topically 4 (four) times daily as needed (pain).    Marland Kitchen doxycycline (VIBRA-TABS) 100 MG tablet Take 1 tablet (100 mg total) by mouth daily.   Marland Kitchen escitalopram (LEXAPRO) 5 MG tablet Take 1 tablet (5 mg total) by mouth daily.   Marland Kitchen Fe Cbn-Fe Gluc-FA-B12-C-DSS (FERRALET 90) 90-1 MG TABS Take 1 mg by mouth daily.   . Fluticasone-Salmeterol (ADVAIR) 250-50 MCG/DOSE AEPB Inhale 1 puff into the lungs 2 (  two) times daily.   Marland Kitchen gabapentin (NEURONTIN) 400 MG capsule Decrease to TID for several days. If tolerated well, may decrease to twice daily.   Marland Kitchen glucose blood (ACCU-CHEK AVIVA PLUS) test strip 1 each by Other route as needed for other. Use as instructed   . hydroxypropyl methylcellulose / hypromellose (ISOPTO TEARS /  GONIOVISC) 2.5 % ophthalmic solution Place 1 drop into both eyes 4 (four) times daily as needed (eye irritation (sand like feeling)).    Marland Kitchen ipratropium (ATROVENT) 0.02 % nebulizer solution Use every 6 hours for Shortness of breath and wheezing and as needed.  Prescription is for 90 days (Patient taking differently: Take 0.5 mg by nebulization every 6 (six) hours as needed for wheezing or shortness of breath. Use every 6 hours for Shortness of breath and wheezing and as needed.  Prescription is for 90 days)   . ipratropium-albuterol (DUONEB) 0.5-2.5 (3) MG/3ML SOLN Inhale into the lungs.   Marland Kitchen loratadine (CLARITIN) 10 MG tablet Take 10 mg by mouth daily.   . meloxicam (MOBIC) 7.5 MG tablet Take 1 tablet (7.5 mg total) by mouth 2 (two) times daily.   . metFORMIN (GLUCOPHAGE) 500 MG tablet Take 0.5 tablets (250 mg total) by mouth 2 (two) times daily with a meal. Decreased dose based on very good blood sugar control   . nitroGLYCERIN (NITROSTAT) 0.4 MG SL tablet Place 0.4 mg under the tongue every 5 (five) minutes x 3 doses as needed for chest pain.    Marland Kitchen nystatin (MYCOSTATIN) 100000 UNIT/ML suspension Take 5 mLs (500,000 Units total) by mouth 4 (four) times daily as needed (mouth/throat irritation.).   Marland Kitchen oxyCODONE-acetaminophen (PERCOCET) 10-325 MG tablet Take 1 tablet by mouth 4 (four) times daily.   . OXYGEN Inhale 2 L into the lungs continuous.    . Roflumilast (DALIRESP) 250 MCG TABS Take 250 mcg by mouth daily.   Marland Kitchen tiotropium (SPIRIVA) 18 MCG inhalation capsule Place 18 mcg into inhaler and inhale daily.  03/11/2020: Uses interchangeably with Spiriva  . traMADol (ULTRAM) 50 MG tablet tramadol 50 mg tablet   . traZODone (DESYREL) 100 MG tablet Take 1 tablet (100 mg total) by mouth at bedtime.   Marland Kitchen umeclidinium bromide (INCRUSE ELLIPTA) 62.5 MCG/INH AEPB Inhale 1 puff into the lungs daily.   . [DISCONTINUED] atorvastatin (LIPITOR) 20 MG tablet Take 1 tablet by mouth once daily   . [DISCONTINUED] furosemide  (LASIX) 20 MG tablet TAKE 2 TABLETS BY MOUTH EVERY OTHER DAY ALTERNATING WITH 1 TABLET    No facility-administered encounter medications on file as of 10/31/2020.    Surgical History: Past Surgical History:  Procedure Laterality Date  . ABDOMINAL AORTIC ANEURYSM REPAIR  03/06/2015   [place]    . AMPUTATION Left    partial amputation  . APPENDECTOMY    . CARDIAC CATHETERIZATION  2010,03/16/2003,11/04/2002  . CARDIOVERSION N/A 04/24/2018   Procedure: CARDIOVERSION;  Surgeon: Nelva Bush, MD;  Location: [facility];  Service: Cardiovascular;  Laterality: N/A;  . CARDIOVERSION N/A 09/05/2018   Procedure: CARDIOVERSION;  Surgeon: Minna Merritts, MD;  Location: [facility];  Service: Cardiovascular;  Laterality: N/A;  . CARDIOVERSION N/A 11/24/2018   Procedure: CARDIOVERSION (CATH LAB);  Surgeon: Wellington Hampshire, MD;  Location: [facility];  Service: Cardiovascular;  Laterality: N/A;  . CARDIOVERSION N/A 03/18/2019   Procedure: CARDIOVERSION;  Surgeon: Minna Merritts, MD;  Location: [facility];  Service: Cardiovascular;  Laterality: N/A;  . CARDIOVERSION N/A 11/13/2019   Procedure:  CARDIOVERSION;  Surgeon: Minna Merritts, MD;  Location: [facility];  Service: Cardiovascular;  Laterality: N/A;  . CORONARY ANGIOPLASTY  03/16/2003   stent to the RCA  & 2/18/2004stent mid circumflex  . ELBOW SURGERY    . HERNIA REPAIR    . NASAL SINUS SURGERY    . PENILE PROSTHESIS IMPLANT  1995  . PILONIDAL CYST EXCISION    . TEE WITHOUT CARDIOVERSION N/A 04/24/2018   Procedure: TRANSESOPHAGEAL ECHOCARDIOGRAM (TEE);  Surgeon: Nelva Bush, MD;  Location: [facility];  Service: Cardiovascular;  Laterality: N/A;  . TONSILECTOMY, ADENOIDECTOMY, BILATERAL MYRINGOTOMY AND TUBES    . UMBILICAL HERNIA REPAIR      Medical History: Past Medical History:  Diagnosis Date  . AAA (abdominal aortic aneurysm) ([place])    a.  Status post endovascular repair at [facility] in 2016  . Anxiety   . Asthma   . Atrial  flutter ([place])    a. diagnosed 7/19; b. CHADS2VASc => 5 (CHF, HTN, age x 1, DM, vascular disease); c. on Eliquis; d. s/p TEE/DCCV 8/19 with repeat DCCV 12/19  . Cancer (HCC)    melanoma L index finger   . Chronic diastolic CHF (congestive heart failure) ([place])   . COPD (chronic obstructive pulmonary disease) ([place])   . Coronary artery disease    a. LHC 5/10: left main 10%, mLAD-1 lesion 20%, mLAD-2 lesion 20%, D1 20%, mLCx-1 lesion 30%, mLCx-2 lesion 50%, OM1 20%, OM to 20%, LPL 20%, pRCA-1 lesion 20%, pRCA-2 lesion 20%.  EF 51% w/ mild inf HK  . Diabetes mellitus with complication ([place])   . Hyperlipidemia   . Hypertension   . Iliac artery aneurysm, bilateral ([place])   . Obstructive sleep apnea-hypopnea syndrome     Family History: Family History  Problem Relation Age of Onset  . Hypertension Mother   . Hyperlipidemia Mother   . Depression Mother   . Heart attack Father 76  . Heart disease Father   . Hypertension Father   . Hyperlipidemia Father     Social History: Social History   Socioeconomic History  . Marital status: Divorced    Spouse name: Not on file  . Number of children: 2  . Years of education: 10   . Highest education level: 10th grade  Occupational History  . Occupation: disabled   Tobacco Use  . Smoking status: Current Every Day Smoker    Packs/day: 0.25    Years: 45.00    Pack years: 11.25    Types: Cigarettes  . Smokeless tobacco: Former Systems developer    Types: Chew  . Tobacco comment: 3 to 4 cigarettes a day  Vaping Use  . Vaping Use: Never used  Substance and Sexual Activity  . Alcohol use: Not Currently  . Drug use: No  . Sexual activity: Not on file  Other Topics Concern  . Not on file  Social History Narrative  . Not on file   Social Determinants of Health   Financial Resource Strain: Not on file  Food Insecurity: No Food Insecurity  . Worried About Charity fundraiser in the Last Year: Never true  . Ran Out of Food in the Last Year: Never true   Transportation Needs: No Transportation Needs  . Lack of Transportation (Medical): No  . Lack of Transportation (Non-Medical): No  Physical Activity: Not on file  Stress: Not on file  Social Connections: Not on file  Intimate Partner Violence: Not on file    Vital Signs: Blood pressure Marland Kitchen)  112/56, pulse (!) 114, temperature (!) 97 F (36.1 C), resp. rate 16, height 5' 10"  (1.778 m), weight 243 lb (110.2 kg), SpO2 97 %.  Examination: General Appearance: The patient is well-developed, well-nourished, and in no distress. Skin: Gross inspection of skin unremarkable. Head: normocephalic, no gross deformities. Eyes: no gross deformities noted. ENT: ears appear grossly normal no exudates. Neck: Supple. No thyromegaly. No LAD. Respiratory: Diminished wheezing throughout, no rhonchi or rales noted. Cardiovascular: Irregular heart rhythm, history of atrial flutter. Extremities: No cyanosis. pulses are equal. Neurologic: Alert and oriented. No involuntary movements.  LABS: Recent Results (from the past 2160 hour(s))  POCT HgB A1C     Status: Abnormal   Collection Time: 08/05/20 10:56 AM  Result Value Ref Range   Hemoglobin A1C 5.7 (A) 4.0 - 5.6 %   HbA1c POC (<> result, manual entry)     HbA1c, POC (prediabetic range)     HbA1c, POC (controlled diabetic range)    Comprehensive metabolic panel     Status: Abnormal   Collection Time: 08/23/20 10:58 AM  Result Value Ref Range   Glucose 116 (H) 65 - 99 mg/dL   BUN 13 8 - 27 mg/dL   Creatinine, Ser 1.04 0.76 - 1.27 mg/dL   GFR calc non Af Amer 73 >59 mL/min/1.73   GFR calc Af Amer 84 >59 mL/min/1.73    Comment: **In accordance with recommendations from the NKF-ASN Task force,**   [facility] is in the process of updating its eGFR calculation to the   2021 CKD-EPI creatinine equation that estimates kidney function   without a race variable.    BUN/Creatinine Ratio 13 10 - 24   Sodium 137 134 - 144 mmol/L   Potassium 4.5 3.5 - 5.2 mmol/L    Chloride 99 96 - 106 mmol/L   CO2 22 20 - 29 mmol/L   Calcium 10.1 8.6 - 10.2 mg/dL   Total Protein 7.6 6.0 - 8.5 g/dL   Albumin 3.9 3.8 - 4.8 g/dL   Globulin, Total 3.7 1.5 - 4.5 g/dL   Albumin/Globulin Ratio 1.1 (L) 1.2 - 2.2   Bilirubin Total 0.4 0.0 - 1.2 mg/dL   Alkaline Phosphatase 95 44 - 121 IU/L    Comment:               **Please note reference interval change**   AST 21 0 - 40 IU/L   ALT 11 0 - 44 IU/L  CBC     Status: Abnormal   Collection Time: 08/23/20 10:58 AM  Result Value Ref Range   WBC 7.6 3.4 - 10.8 x10E3/uL   RBC 3.64 (L) 4.14 - 5.80 x10E6/uL   Hemoglobin 11.6 (L) 13.0 - 17.7 g/dL   Hematocrit 34.5 (L) 37.5 - 51.0 %   MCV 95 79 - 97 fL   MCH 31.9 26.6 - 33.0 pg   MCHC 33.6 31.5 - 35.7 g/dL   RDW 15.8 (H) 11.6 - 15.4 %   Platelets 161 150 - 450 x10E3/uL  Lipid Panel With LDL/HDL Ratio     Status: None   Collection Time: 08/23/20 10:58 AM  Result Value Ref Range   Cholesterol, Total 122 100 - 199 mg/dL   Triglycerides 97 0 - 149 mg/dL   HDL 52 >39 mg/dL   VLDL Cholesterol Cal 18 5 - 40 mg/dL   LDL Chol Calc (NIH) 52 0 - 99 mg/dL   LDL/HDL Ratio 1.0 0.0 - 3.6 ratio    Comment:  LDL/HDL Ratio                                             Men  Women                               1/2 Avg.Risk  1.0    1.5                                   Avg.Risk  3.6    3.2                                2X Avg.Risk  6.2    5.0                                3X Avg.Risk  8.0    6.1   HCV Ab w Reflex to Quant PCR     Status: None   Collection Time: 08/23/20 10:58 AM  Result Value Ref Range   HCV Ab <0.1 0.0 - 0.9 s/co ratio  T4, free     Status: None   Collection Time: 08/23/20 10:58 AM  Result Value Ref Range   Free T4 0.84 0.82 - 1.77 ng/dL  TSH     Status: None   Collection Time: 08/23/20 10:58 AM  Result Value Ref Range   TSH 2.250 0.450 - 4.500 uIU/mL  PSA     Status: None   Collection Time: 08/23/20 10:58 AM  Result Value  Ref Range   Prostate Specific Ag, Serum 0.1 0.0 - 4.0 ng/mL    Comment: Roche ECLIA methodology. According to the American Urological Association, Serum PSA should decrease and remain at undetectable levels after radical prostatectomy. The AUA defines biochemical recurrence as an initial PSA value 0.2 ng/mL or greater followed by a subsequent confirmatory PSA value 0.2 ng/mL or greater. Values obtained with different assay methods or kits cannot be used interchangeably. Results cannot be interpreted as absolute evidence of the presence or absence of malignant disease.   Interpretation:     Status: None   Collection Time: 08/23/20 10:58 AM  Result Value Ref Range   HCV Interp 1: Comment     Comment: Negative Not infected with HCV, unless recent infection is suspected or other evidence exists to indicate HCV infection.   UA/M w/rflx Culture, Routine     Status: None   Collection Time: 08/29/20  9:40 AM   Specimen: Urine   Urine  Result Value Ref Range   Specific Gravity, UA 1.006 1.005 - 1.030   pH, UA 5.5 5.0 - 7.5   Color, UA Yellow Yellow   Appearance Ur Clear Clear   Leukocytes,UA Negative Negative   Protein,UA Negative Negative/Trace   Glucose, UA Negative Negative   Ketones, UA Negative Negative   RBC, UA Negative Negative   Bilirubin, UA Negative Negative   Urobilinogen, Ur 0.2 0.2 - 1.0 mg/dL   Nitrite, UA Negative Negative   Microscopic Examination Comment     Comment: Microscopic follows if indicated.   Microscopic Examination See below:     Comment: Microscopic was indicated and was  performed.   Urinalysis Reflex Comment     Comment: This specimen will not reflex to a Urine Culture.  Microscopic Examination     Status: None   Collection Time: 08/29/20  9:40 AM   Urine  Result Value Ref Range   WBC, UA None seen 0 - 5 /hpf   RBC None seen 0 - 2 /hpf   Epithelial Cells (non renal) None seen 0 - 10 /hpf   Casts None seen None seen /lpf   Bacteria, UA None seen  None seen/Few  Vitamin B12     Status: None   Collection Time: 10/06/20  9:20 AM  Result Value Ref Range   Vitamin B-12 343 232 - 1,245 pg/mL  Folate     Status: None   Collection Time: 10/06/20  9:20 AM  Result Value Ref Range   Folate 5.9 >3.0 ng/mL    Comment: A serum folate concentration of less than 3.1 ng/mL is considered to represent clinical deficiency.   Iron and TIBC     Status: Abnormal   Collection Time: 10/06/20  9:20 AM  Result Value Ref Range   Total Iron Binding Capacity 344 250 - 450 ug/dL   UIBC 312 111 - 343 ug/dL   Iron 32 (L) 38 - 169 ug/dL   Iron Saturation 9 (LL) 15 - 55 %  Ferritin     Status: Abnormal   Collection Time: 10/06/20  9:20 AM  Result Value Ref Range   Ferritin 20 (L) 30 - 400 ng/mL    Radiology: MR Brain W Wo Contrast  Result Date: 03/25/2020 CLINICAL DATA:  Subacute neuro deficits. Dizziness and off balance with slurred speech for 6-8 months EXAM: MRI HEAD WITHOUT AND WITH CONTRAST TECHNIQUE: Multiplanar, multiecho pulse sequences of the brain and surrounding structures were obtained without and with intravenous contrast. CONTRAST:  68m MULTIHANCE GADOBENATE DIMEGLUMINE 529 MG/ML IV SOLN COMPARISON:  None available FINDINGS: Brain: T1 hyperintensity in the bilateral globus pallidus correlating with cirrhosis seen by prior CTs. Mild cortical atrophy. Subcentimeter focus of restricted diffusion in the left temporal white matter without enhancement. Few remote white matter insults, typical for age No blood products, hydrocephalus, masslike finding, or collection. Vascular: Normal flow voids and preserved vascular enhancements. There is a degree of intracranial tortuosity. Skull and upper cervical spine: No focal marrow lesion. Sinuses/Orbits: Mild opacification of ethmoid and right maxillary sinuses. Negative orbits. These results will be called to the ordering clinician or representative by the Radiologist Assistant, and communication documented in the  PACS or [employer] IMPRESSION: 1. Incidental acute lacunar infarct in the left temporal white matter. Chronic ischemic injury is overall mild for age. 2. Globus pallidus mineralization in keeping with cirrhosis seen on prior CTs. 3. Mild cortical atrophy. Electronically Signed   By: JMonte FantasiaM.D.   On: 03/25/2020 10:32    No results found.  No results found.    Assessment and Plan: Patient Active Problem List   Diagnosis Date Noted  . COPD, severe ([place] 09/03/2020  . Frequent falls 05/17/2020  . Weakness 05/17/2020  . Abnormality of gait 05/17/2020  . Dizziness 05/17/2020  . Left sided lacunar infarction ([place] 04/06/2020  . Syncope and collapse 01/31/2020  . Other symptoms and signs involving the nervous system 01/31/2020  . Chronic obstructive pulmonary disease with acute exacerbation ([place] 10/23/2019  . Fungal infection of toenail 10/23/2019  . Encounter for general adult medical examination with abnormal findings 08/21/2019  . Need for vaccination against Streptococcus  pneumoniae 08/21/2019  . Flu vaccine need 07/14/2019  . Type 2 diabetes mellitus with hyperglycemia, without long-term current use of insulin ([place]) 04/13/2019  . Hoarseness of voice 03/05/2019  . Acne vulgaris 12/05/2018  . Atrial fibrillation ([place]) 05/02/2018  . Atrial flutter ([place]) 04/09/2018  . Uncontrolled type 2 diabetes mellitus with hyperglycemia ([place]) 01/09/2018  . Nicotine dependence with current use 01/09/2018  . Diabetes mellitus without complication ([place]) 65/99/3570  . Myalgia 09/19/2017  . Vitamin B12 deficiency anemia 09/19/2017  . Common migraine with intractable migraine 09/19/2017  . Chronic hypoxemic respiratory failure ([place]) 09/19/2017  . Peripheral vascular disease, unspecified ([place]) 09/19/2017  . Dysuria 09/19/2017  . Osteoarthritis 09/19/2017  . Diverticulitis of intestine 09/19/2017  . Cervicalgia 09/19/2017  . Unspecified abdominal hernia without obstruction or gangrene  09/19/2017  . Vasomotor rhinitis 09/19/2017  . Occlusion and stenosis of bilateral carotid arteries 09/19/2017  . Generalized anxiety disorder 09/19/2017  . Malaise 09/19/2017  . Chronic bronchitis ([place]) 09/19/2017  . OSA on CPAP 09/19/2017  . Cardiac arrhythmia 09/19/2017  . Smoker 04/02/2016  . Pain in the chest 08/20/2014  . SOB (shortness of breath) 08/20/2014  . Coronary artery disease involving native coronary artery of native heart with angina pectoris with documented spasm ([place]) 08/20/2014  . Emphysema of lung ([place]) 08/20/2014  . Coronary artery disease involving native coronary artery of native heart with unstable angina pectoris ([place]) 08/20/2014  . AAA (abdominal aortic aneurysm) without rupture ([place]) 08/20/2014  . Iliac artery aneurysm, bilateral ([place]) 08/20/2014  . Hyperlipidemia 08/20/2014  . Essential hypertension 08/20/2014  . Morbid obesity ([place]) 08/20/2014    1. COPD, severe ([place]) He will contact [facility] pharmacist and request samples of Tudorza as we do not have available samples in office at this time He prefers to sample inhaler before changing his prescription Advised to have [facility] pharmacist contact me with further information and plan of care  2. OSA on CPAP Continue with nightly CPAP compliance--will request recent download from [place]  3. Chronic atrial fibrillation (HCC) Stable, followed by cardiology  4. Supplemental oxygen dependent Continue with 2LPM via [place] during the day and CPAP at night  General Counseling: I have discussed the findings of the evaluation and examination with Javani.  I have also discussed any further diagnostic evaluation thatmay be needed or ordered today. Avrian verbalizes understanding of the findings of todays visit. We also reviewed his medications today and discussed drug interactions and side effects including but not limited excessive drowsiness and altered mental states. We also discussed that there is always a risk not just to  him but also people around him. he has been encouraged to call the office with any questions or concerns that should arise related to todays visit.     Time spent: 30  I have personally obtained a history, examined the patient, evaluated laboratory and imaging results, formulated the assessment and plan and placed orders. This patient was seen by Theodoro Grist AGNP-C in Collaboration with Dr. Devona Konig as a part of collaborative care agreement.    [name], MD [facility] Pulmonary and Critical Care Sleep medicine

## 2020-11-11 DIAGNOSIS — J449 Chronic obstructive pulmonary disease, unspecified: Secondary | ICD-10-CM | POA: Diagnosis not present

## 2020-11-15 ENCOUNTER — Ambulatory Visit (INDEPENDENT_AMBULATORY_CARE_PROVIDER_SITE_OTHER): Payer: Medicare HMO | Admitting: Hospice and Palliative Medicine

## 2020-11-15 ENCOUNTER — Other Ambulatory Visit: Payer: Self-pay

## 2020-11-15 ENCOUNTER — Encounter: Payer: Self-pay | Admitting: Hospice and Palliative Medicine

## 2020-11-15 VITALS — BP 97/72 | HR 84 | Temp 97.5°F | Resp 16 | Ht 70.0 in | Wt 242.0 lb

## 2020-11-15 DIAGNOSIS — I482 Chronic atrial fibrillation, unspecified: Secondary | ICD-10-CM

## 2020-11-15 DIAGNOSIS — E1165 Type 2 diabetes mellitus with hyperglycemia: Secondary | ICD-10-CM | POA: Diagnosis not present

## 2020-11-15 DIAGNOSIS — D509 Iron deficiency anemia, unspecified: Secondary | ICD-10-CM

## 2020-11-15 DIAGNOSIS — J449 Chronic obstructive pulmonary disease, unspecified: Secondary | ICD-10-CM

## 2020-11-15 LAB — POCT GLYCOSYLATED HEMOGLOBIN (HGB A1C): Hemoglobin A1C: 5.6 % (ref 4.0–5.6)

## 2020-11-15 LAB — POCT UA - MICROALBUMIN
Albumin/Creatinine Ratio, Urine, POC: <30
Creatinine, POC: 50 mg/dL
Microalbumin Ur, POC: 10 mg/L

## 2020-11-15 NOTE — Progress Notes (Signed)
[facility] [place], [place] [address]  Internal MEDICINE  Office Visit Note  Patient Name: Lawrence Steele  [id number]  [id number]  Date of Service: 11/16/2020  Chief Complaint  Patient presents with  . 2 month follow up  . Diabetes  . Hyperlipidemia  . Hypertension    HPI Patient is here for routine follow-up DM-glucose levels have been well controlled Unable to afford Ferralyte taking OTC folic acid as well as iron supplements Reviewed iron studies--has strong family history of iron deficiency Discussed hematology referral for possible iron infusion--he declines at this time Denies alcohol use, unfortunately he does continue to smoke about 5 cigarettes per day Followed by pulmonary for COPD and chronic respiratory failure, breathing remains stable, continuous oxygen with stable SpO2 levels Followed by pain management for chronic back pain--long term use of narcotic therapy Followed by cardiology for CAD, paroxsymal atrial fibrillation Has remained in NSR for several months, recently flipped into atrial fib/flutter--remains irregular today Decline repeating cardioversion with Dr. Rockey Situ during office visit, continued on beta blocker and anticoagulation   Current Medication: Outpatient Encounter Medications as of 11/15/2020  Medication Sig Note  . Accu-Chek Softclix Lancets lancets Use as instructed  Once a daily E11.65   . acetaminophen (TYLENOL) 500 MG tablet Take 1,000 mg by mouth every 6 (six) hours as needed for headache.   . Aclidinium Bromide 400 MCG/ACT AEPB Inhale 400 mg into the lungs 2 (two) times daily.   Marland Kitchen albuterol (VENTOLIN HFA) 108 (90 Base) MCG/ACT inhaler Inhale 2 puffs into the lungs every 6 (six) hours as needed for wheezing or shortness of breath.   . allopurinol (ZYLOPRIM) 100 MG tablet Take 1 tablet (100 mg total) by mouth 2 (two) times daily.   Marland Kitchen apixaban (ELIQUIS) 5 MG TABS tablet Take 1 tablet (5 mg total) by mouth 2 (two) times  daily.   Marland Kitchen atenolol (TENORMIN) 25 MG tablet Take 2 tablets (50 mg) in the morning and 1 tablet (25 mg) in the evening, (Patient taking differently: Take 25-50 mg by mouth 2 (two) times daily. Take 2 tablets (50 mg) in the morning and 1 tablet (25 mg) in the evening,)   . atorvastatin (LIPITOR) 20 MG tablet Take 1 tablet by mouth once daily   . Blood Glucose Monitoring Suppl (ACCU-CHEK AVIVA PLUS) w/Device KIT Use as directed   . calcium carbonate (TUMS EX) 750 MG chewable tablet Chew by mouth.   . cyclobenzaprine (FLEXERIL) 10 MG tablet Take 1 tablet (10 mg total) by mouth 2 (two) times daily at 10 AM and 5 PM.   . diclofenac sodium (VOLTAREN) 1 % GEL Apply 1 application topically 4 (four) times daily as needed (pain).    Marland Kitchen doxycycline (VIBRA-TABS) 100 MG tablet Take 1 tablet (100 mg total) by mouth daily.   Marland Kitchen escitalopram (LEXAPRO) 5 MG tablet Take 1 tablet (5 mg total) by mouth daily.   Marland Kitchen Fe Cbn-Fe Gluc-FA-B12-C-DSS (FERRALET 90) 90-1 MG TABS Take 1 mg by mouth daily.   . Fluticasone-Salmeterol (ADVAIR) 250-50 MCG/DOSE AEPB Inhale 1 puff into the lungs 2 (two) times daily.   . furosemide (LASIX) 20 MG tablet TAKE 2 TABLETS BY MOUTH EVERY OTHER DAY ,ALTERNATE  WITH  1  TABLET   . gabapentin (NEURONTIN) 400 MG capsule Decrease to TID for several days. If tolerated well, may decrease to twice daily.   Marland Kitchen glucose blood (ACCU-CHEK AVIVA PLUS) test strip 1 each by Other route as needed for other. Use  as instructed   . hydroxypropyl methylcellulose / hypromellose (ISOPTO TEARS / GONIOVISC) 2.5 % ophthalmic solution Place 1 drop into both eyes 4 (four) times daily as needed (eye irritation (sand like feeling)).    Marland Kitchen ipratropium (ATROVENT) 0.02 % nebulizer solution Use every 6 hours for Shortness of breath and wheezing and as needed.  Prescription is for 90 days (Patient taking differently: Take 0.5 mg by nebulization every 6 (six) hours as needed for wheezing or shortness of breath. Use every 6 hours for  Shortness of breath and wheezing and as needed.  Prescription is for 90 days)   . ipratropium-albuterol (DUONEB) 0.5-2.5 (3) MG/3ML SOLN Inhale into the lungs.   Marland Kitchen loratadine (CLARITIN) 10 MG tablet Take 10 mg by mouth daily.   . meloxicam (MOBIC) 7.5 MG tablet Take 1 tablet (7.5 mg total) by mouth 2 (two) times daily.   . metFORMIN (GLUCOPHAGE) 500 MG tablet Take 0.5 tablets (250 mg total) by mouth 2 (two) times daily with a meal. Decreased dose based on very good blood sugar control   . nitroGLYCERIN (NITROSTAT) 0.4 MG SL tablet Place 0.4 mg under the tongue every 5 (five) minutes x 3 doses as needed for chest pain.    Marland Kitchen nystatin (MYCOSTATIN) 100000 UNIT/ML suspension Take 5 mLs (500,000 Units total) by mouth 4 (four) times daily as needed (mouth/throat irritation.).   Marland Kitchen oxyCODONE-acetaminophen (PERCOCET) 10-325 MG tablet Take 1 tablet by mouth 4 (four) times daily.   . OXYGEN Inhale 2 L into the lungs continuous.    . Roflumilast (DALIRESP) 250 MCG TABS Take 250 mcg by mouth daily.   Marland Kitchen tiotropium (SPIRIVA) 18 MCG inhalation capsule Place 18 mcg into inhaler and inhale daily.  03/11/2020: Uses interchangeably with Spiriva  . traMADol (ULTRAM) 50 MG tablet tramadol 50 mg tablet   . traZODone (DESYREL) 100 MG tablet Take 1 tablet (100 mg total) by mouth at bedtime.   Marland Kitchen umeclidinium bromide (INCRUSE ELLIPTA) 62.5 MCG/INH AEPB Inhale 1 puff into the lungs daily.    No facility-administered encounter medications on file as of 11/15/2020.    Surgical History: Past Surgical History:  Procedure Laterality Date  . ABDOMINAL AORTIC ANEURYSM REPAIR  03/06/2015   [place]    . AMPUTATION Left    partial amputation  . APPENDECTOMY    . CARDIAC CATHETERIZATION  2010,03/16/2003,11/04/2002  . CARDIOVERSION N/A 04/24/2018   Procedure: CARDIOVERSION;  Surgeon: Nelva Bush, MD;  Location: [facility];  Service: Cardiovascular;  Laterality: N/A;  . CARDIOVERSION N/A 09/05/2018    Procedure: CARDIOVERSION;  Surgeon: Minna Merritts, MD;  Location: [facility];  Service: Cardiovascular;  Laterality: N/A;  . CARDIOVERSION N/A 11/24/2018   Procedure: CARDIOVERSION (CATH LAB);  Surgeon: Wellington Hampshire, MD;  Location: [facility];  Service: Cardiovascular;  Laterality: N/A;  . CARDIOVERSION N/A 03/18/2019   Procedure: CARDIOVERSION;  Surgeon: Minna Merritts, MD;  Location: [facility];  Service: Cardiovascular;  Laterality: N/A;  . CARDIOVERSION N/A 11/13/2019   Procedure: CARDIOVERSION;  Surgeon: Minna Merritts, MD;  Location: [facility];  Service: Cardiovascular;  Laterality: N/A;  . CORONARY ANGIOPLASTY  03/16/2003   stent to the RCA  & 2/18/2004stent mid circumflex  . ELBOW SURGERY    . HERNIA REPAIR    . NASAL SINUS SURGERY    . PENILE PROSTHESIS IMPLANT  1995  . PILONIDAL CYST EXCISION    . TEE WITHOUT CARDIOVERSION N/A 04/24/2018   Procedure: TRANSESOPHAGEAL ECHOCARDIOGRAM (TEE);  Surgeon:  End, Harrell Gave, MD;  Location: [facility];  Service: Cardiovascular;  Laterality: N/A;  . TONSILECTOMY, ADENOIDECTOMY, BILATERAL MYRINGOTOMY AND TUBES    . UMBILICAL HERNIA REPAIR      Medical History: Past Medical History:  Diagnosis Date  . AAA (abdominal aortic aneurysm) ([place])    a.  Status post endovascular repair at [facility] in 2016  . Anxiety   . Asthma   . Atrial flutter ([place])    a. diagnosed 7/19; b. CHADS2VASc => 5 (CHF, HTN, age x 1, DM, vascular disease); c. on Eliquis; d. s/p TEE/DCCV 8/19 with repeat DCCV 12/19  . Cancer (HCC)    melanoma L index finger   . Chronic diastolic CHF (congestive heart failure) ([place])   . COPD (chronic obstructive pulmonary disease) ([place])   . Coronary artery disease    a. LHC 5/10: left main 10%, mLAD-1 lesion 20%, mLAD-2 lesion 20%, D1 20%, mLCx-1 lesion 30%, mLCx-2 lesion 50%, OM1 20%, OM to 20%, LPL 20%, pRCA-1 lesion 20%, pRCA-2 lesion 20%.  EF 51% w/ mild inf HK  . Diabetes mellitus with complication ([place])   . Hyperlipidemia   .  Hypertension   . Iliac artery aneurysm, bilateral ([place])   . Obstructive sleep apnea-hypopnea syndrome     Family History: Family History  Problem Relation Age of Onset  . Hypertension Mother   . Hyperlipidemia Mother   . Depression Mother   . Heart attack Father 61  . Heart disease Father   . Hypertension Father   . Hyperlipidemia Father     Social History   Socioeconomic History  . Marital status: Divorced    Spouse name: Not on file  . Number of children: 2  . Years of education: 10   . Highest education level: 10th grade  Occupational History  . Occupation: disabled   Tobacco Use  . Smoking status: Current Every Day Smoker    Packs/day: 0.25    Years: 45.00    Pack years: 11.25    Types: Cigarettes  . Smokeless tobacco: Former Systems developer    Types: Chew  . Tobacco comment: 3 to 4 cigarettes a day  Vaping Use  . Vaping Use: Never used  Substance and Sexual Activity  . Alcohol use: Not Currently  . Drug use: No  . Sexual activity: Not on file  Other Topics Concern  . Not on file  Social History Narrative  . Not on file   Social Determinants of Health   Financial Resource Strain: Not on file  Food Insecurity: No Food Insecurity  . Worried About Charity fundraiser in the Last Year: Never true  . Ran Out of Food in the Last Year: Never true  Transportation Needs: No Transportation Needs  . Lack of Transportation (Medical): No  . Lack of Transportation (Non-Medical): No  Physical Activity: Not on file  Stress: Not on file  Social Connections: Not on file  Intimate Partner Violence: Not on file      Review of Systems  Constitutional: Negative for chills, fatigue and unexpected weight change.  HENT: Negative for congestion, postnasal drip, rhinorrhea, sneezing and sore throat.   Eyes: Negative for redness.  Respiratory: Positive for shortness of breath and wheezing. Negative for cough and chest tightness.   Cardiovascular: Negative for chest pain and  palpitations.  Gastrointestinal: Negative for abdominal pain, constipation, diarrhea, nausea and vomiting.  Genitourinary: Negative for dysuria and frequency.  Musculoskeletal: Positive for arthralgias and back pain. Negative for joint swelling and neck  pain.  Skin: Negative for rash.  Neurological: Negative for tremors and numbness.  Hematological: Negative for adenopathy. Does not bruise/bleed easily.  Psychiatric/Behavioral: Negative for behavioral problems (Depression), sleep disturbance and suicidal ideas. The patient is not nervous/anxious.     Vital Signs: BP 97/72   Pulse 84   Temp (!) 97.5 F (36.4 C)   Resp 16   Ht 5' 10" (1.778 m)   Wt 242 lb (109.8 kg)   SpO2 95%   BMI 34.72 kg/m    Physical Exam Vitals reviewed.  Constitutional:      Appearance: Normal appearance. He is obese.  Cardiovascular:     Rate and Rhythm: Normal rate. Rhythm irregular.     Pulses: Normal pulses.     Heart sounds: Normal heart sounds.  Pulmonary:     Effort: Pulmonary effort is normal.     Breath sounds: Normal breath sounds.  Abdominal:     General: Abdomen is flat.     Palpations: Abdomen is soft.  Musculoskeletal:        General: Normal range of motion.     Cervical back: Normal range of motion.  Skin:    General: Skin is warm.  Neurological:     General: No focal deficit present.     Mental Status: He is alert and oriented to person, place, and time. Mental status is at baseline.  Psychiatric:        Mood and Affect: Mood normal.        Behavior: Behavior normal.        Thought Content: Thought content normal.        Judgment: Judgment normal.    Assessment/Plan: 1. Type 2 diabetes mellitus with hyperglycemia, without long-term current use of insulin (HCC) A1C 5.6--well controlled, continue with Metformin--normal kidney function Microalbumin levels normal - POCT HgB A1C - POCT UA - Microalbumin  2. Iron deficiency anemia, unspecified iron deficiency anemia  type Discussed possible side effects and risks associated with anemia, offered hematology referral-he declines at this time Will take over the counter iron supplements--will recheck levels in a few months, if no improvement will strongly recommend hematology referral  3. Chronic atrial fibrillation (HCC) A. Fib/flutter today, rate controlled BP on lower end of normal today, he denies lightheadedness or dizziness Closely monitor, may consider midodrine per Dr. Rockey Situ last office note Continue with antcoagulation  4. COPD, severe ([place]) Continue with all supportive measures at this time Followed by pulmonology  General Counseling: Dock verbalizes understanding of the findings of todays visit and agrees with plan of treatment. I have discussed any further diagnostic evaluation that may be needed or ordered today. We also reviewed his medications today. he has been encouraged to call the office with any questions or concerns that should arise related to todays visit.    Orders Placed This Encounter  Procedures  . POCT HgB A1C  . POCT UA - Microalbumin      Time spent: 30 Minutes Time spent includes review of chart, medications, test results and follow-up plan with the patient.  This patient was seen by Theodoro Grist AGNP-C in Collaboration with Dr Lavera Guise as a part of collaborative care agreement     Tanna Furry. Harris AGNP-C Internal medicine

## 2020-11-16 ENCOUNTER — Encounter: Payer: Self-pay | Admitting: Hospice and Palliative Medicine

## 2020-11-25 ENCOUNTER — Other Ambulatory Visit: Payer: Self-pay | Admitting: Nurse Practitioner

## 2020-11-28 ENCOUNTER — Other Ambulatory Visit: Payer: Self-pay | Admitting: Nurse Practitioner

## 2020-12-01 ENCOUNTER — Other Ambulatory Visit: Payer: Self-pay

## 2020-12-01 NOTE — Patient Outreach (Signed)
[facility]) Care Management  12/01/2020  Lawrence Steele Mar 09, 1951 [id number]   Telephone call to patient for disease management follow up.   No answer.  HIPAA compliant voice message left.    Plan: If no return call, RN CM will attempt patient again in the month of June.  Jone Baseman, RN, MSN [place] Management Care Management Coordinator Direct Line [phone] Cell [phone] Toll Free: [phone]  Fax: [phone]

## 2020-12-09 DIAGNOSIS — J449 Chronic obstructive pulmonary disease, unspecified: Secondary | ICD-10-CM | POA: Diagnosis not present

## 2020-12-19 ENCOUNTER — Telehealth: Payer: Self-pay | Admitting: Internal Medicine

## 2020-12-19 NOTE — Progress Notes (Signed)
  Chronic Care Management   Note  12/19/2020 Name: Lawrence Steele MRN: [id number] DOB: 08-Oct-1950  Lawrence Steele is a 70 y.o. year old male who is a primary care patient of Lavera Guise, MD. I reached out to [name] by phone today in response to a referral sent by [name] PCP, Lavera Guise, MD.   [name] was given information about Chronic Care Management services today including:  1. CCM service includes personalized support from designated clinical staff supervised by his physician, including individualized plan of care and coordination with other care providers 2. 24/7 contact phone numbers for assistance for urgent and routine care needs. 3. Service will only be billed when office clinical staff spend 20 minutes or more in a month to coordinate care. 4. Only one practitioner may furnish and bill the service in a calendar month. 5. The patient may stop CCM services at any time (effective at the end of the month) by phone call to the office staff.   Patient wishes to consider information provided and/or speak with a member of the care team before deciding about enrollment in care management services.   Follow up plan:   Carley Perdue [employer] Scheduler

## 2020-12-28 DIAGNOSIS — Z79899 Other long term (current) drug therapy: Secondary | ICD-10-CM | POA: Diagnosis not present

## 2020-12-28 DIAGNOSIS — Z79891 Long term (current) use of opiate analgesic: Secondary | ICD-10-CM | POA: Diagnosis not present

## 2020-12-28 DIAGNOSIS — G894 Chronic pain syndrome: Secondary | ICD-10-CM | POA: Diagnosis not present

## 2020-12-28 DIAGNOSIS — M47896 Other spondylosis, lumbar region: Secondary | ICD-10-CM | POA: Diagnosis not present

## 2020-12-29 DIAGNOSIS — J449 Chronic obstructive pulmonary disease, unspecified: Secondary | ICD-10-CM | POA: Diagnosis not present

## 2020-12-29 DIAGNOSIS — I509 Heart failure, unspecified: Secondary | ICD-10-CM | POA: Diagnosis not present

## 2020-12-29 DIAGNOSIS — E1159 Type 2 diabetes mellitus with other circulatory complications: Secondary | ICD-10-CM | POA: Diagnosis not present

## 2020-12-29 DIAGNOSIS — D692 Other nonthrombocytopenic purpura: Secondary | ICD-10-CM | POA: Diagnosis not present

## 2020-12-29 DIAGNOSIS — G4733 Obstructive sleep apnea (adult) (pediatric): Secondary | ICD-10-CM | POA: Diagnosis not present

## 2020-12-29 DIAGNOSIS — I25118 Atherosclerotic heart disease of native coronary artery with other forms of angina pectoris: Secondary | ICD-10-CM | POA: Diagnosis not present

## 2020-12-29 DIAGNOSIS — E261 Secondary hyperaldosteronism: Secondary | ICD-10-CM | POA: Diagnosis not present

## 2020-12-29 DIAGNOSIS — F17200 Nicotine dependence, unspecified, uncomplicated: Secondary | ICD-10-CM | POA: Diagnosis not present

## 2020-12-29 DIAGNOSIS — D6869 Other thrombophilia: Secondary | ICD-10-CM | POA: Diagnosis not present

## 2020-12-29 DIAGNOSIS — F11982 Opioid use, unspecified with opioid-induced sleep disorder: Secondary | ICD-10-CM | POA: Diagnosis not present

## 2020-12-29 DIAGNOSIS — I714 Abdominal aortic aneurysm, without rupture: Secondary | ICD-10-CM | POA: Diagnosis not present

## 2020-12-29 DIAGNOSIS — I4819 Other persistent atrial fibrillation: Secondary | ICD-10-CM | POA: Diagnosis not present

## 2020-12-29 DIAGNOSIS — J9611 Chronic respiratory failure with hypoxia: Secondary | ICD-10-CM | POA: Diagnosis not present

## 2020-12-30 ENCOUNTER — Other Ambulatory Visit: Payer: Self-pay | Admitting: Nurse Practitioner

## 2020-12-30 ENCOUNTER — Other Ambulatory Visit: Payer: Self-pay | Admitting: Hospice and Palliative Medicine

## 2021-01-02 ENCOUNTER — Other Ambulatory Visit: Payer: Self-pay

## 2021-01-02 DIAGNOSIS — J440 Chronic obstructive pulmonary disease with acute lower respiratory infection: Secondary | ICD-10-CM

## 2021-01-02 MED ORDER — ALLOPURINOL 100 MG PO TABS: 100.0000 mg | ORAL_TABLET | Freq: Two times a day (BID) | ORAL | 1 refills | Status: AC

## 2021-01-02 MED ORDER — IPRATROPIUM-ALBUTEROL 0.5-2.5 (3) MG/3ML IN SOLN: 3.0000 mL | Freq: Four times a day (QID) | RESPIRATORY_TRACT | 0 refills | Status: AC | PRN

## 2021-01-02 MED ORDER — IPRATROPIUM BROMIDE 0.02 % IN SOLN: 0.5000 mg | Freq: Four times a day (QID) | RESPIRATORY_TRACT | 1 refills | Status: DC | PRN

## 2021-01-02 MED ORDER — IPRATROPIUM BROMIDE 0.02 % IN SOLN
0.5000 mg | Freq: Four times a day (QID) | RESPIRATORY_TRACT | 1 refills | Status: AC | PRN
Start: 2021-01-02 — End: ?

## 2021-01-06 ENCOUNTER — Telehealth: Payer: Self-pay

## 2021-01-06 NOTE — Telephone Encounter (Signed)
Completed medical records for [facility]  Mailed to [address], [address] [place] [address] 

## 2021-01-09 DIAGNOSIS — J449 Chronic obstructive pulmonary disease, unspecified: Secondary | ICD-10-CM | POA: Diagnosis not present

## 2021-01-20 ENCOUNTER — Other Ambulatory Visit: Payer: Self-pay

## 2021-01-20 MED ORDER — LEVOFLOXACIN 500 MG PO TABS: 500.0000 mg | ORAL_TABLET | Freq: Every day | ORAL | 0 refills | Status: AC

## 2021-01-20 NOTE — Telephone Encounter (Signed)
Spoke to pt and he advised that he has been coughing up some blood for 2-3 months now.  He advised that sometimes it feels like he has a sore in the back of his throat and that may be where the blood comes from but not sure, he states that at times it may be a small amount and other times it may be like a half full small coffee cup with some spit mixed in.  He states it is not everyday but again he doesn't look for it every day.  Pt advised that he has had some pressure around his eyes and coughs up a lot of mucus.  I spoke to [facility] and she will send in Levaquin 500 mg 1 tablet daily x 10 days and advise pt that if things seem to get worse before next appt that he should go to ER.

## 2021-01-26 ENCOUNTER — Other Ambulatory Visit: Payer: Self-pay

## 2021-01-26 DIAGNOSIS — R042 Hemoptysis: Secondary | ICD-10-CM

## 2021-01-30 ENCOUNTER — Other Ambulatory Visit: Payer: Self-pay

## 2021-01-30 MED ORDER — DOXYCYCLINE HYCLATE 100 MG PO TABS: 100.0000 mg | ORAL_TABLET | Freq: Every day | ORAL | 1 refills | Status: AC

## 2021-01-31 ENCOUNTER — Telehealth: Payer: Self-pay

## 2021-01-31 ENCOUNTER — Ambulatory Visit: Payer: Medicare HMO | Admitting: Internal Medicine

## 2021-01-31 NOTE — Telephone Encounter (Signed)
Pt has CT of chest scheduled 02-08-21 and will be seen after.

## 2021-02-01 ENCOUNTER — Telehealth: Payer: Self-pay | Admitting: Pharmacy Technician

## 2021-02-01 NOTE — Progress Notes (Signed)
[facility])                                            [place] Team    02/01/2021  Lawrence Steele [birth year] [id number]  Incoming call received from patient in regards to medication assistance.  Spoke to patient, HIPAA verified.  Patient informs he has spent around $700 OOP and is interested in pursuing medication assistance for Eliquis with [facility] and Advair and Incruse with [facility]. Informed patient a referral would be placed in [employer] and to be expecting a call from  [facility]  to discuss next steps. Patient was agreeable to this plan.  Milano Rosevear P. Toron Bowring, [place]  [phone]

## 2021-02-06 DIAGNOSIS — G4733 Obstructive sleep apnea (adult) (pediatric): Secondary | ICD-10-CM | POA: Diagnosis not present

## 2021-02-07 DIAGNOSIS — G4733 Obstructive sleep apnea (adult) (pediatric): Secondary | ICD-10-CM | POA: Diagnosis not present

## 2021-02-08 ENCOUNTER — Other Ambulatory Visit: Payer: Self-pay

## 2021-02-08 ENCOUNTER — Ambulatory Visit
Admission: RE | Admit: 2021-02-08 | Discharge: 2021-02-08 | Disposition: A | Discharge status: Home / Self Care | Payer: Medicare HMO | Source: Ambulatory Visit | Attending: Internal Medicine | Admitting: Internal Medicine

## 2021-02-08 DIAGNOSIS — J90 Pleural effusion, not elsewhere classified: Secondary | ICD-10-CM | POA: Diagnosis not present

## 2021-02-08 DIAGNOSIS — J9811 Atelectasis: Secondary | ICD-10-CM | POA: Diagnosis not present

## 2021-02-08 DIAGNOSIS — J449 Chronic obstructive pulmonary disease, unspecified: Secondary | ICD-10-CM | POA: Diagnosis not present

## 2021-02-08 DIAGNOSIS — R042 Hemoptysis: Secondary | ICD-10-CM | POA: Diagnosis not present

## 2021-02-08 DIAGNOSIS — J929 Pleural plaque without asbestos: Secondary | ICD-10-CM | POA: Diagnosis not present

## 2021-02-14 ENCOUNTER — Telehealth: Payer: Self-pay | Admitting: Pharmacy Technician

## 2021-02-14 DIAGNOSIS — Z596 Low income: Secondary | ICD-10-CM

## 2021-02-14 NOTE — Progress Notes (Signed)
[facility])                                            [place] Team    02/14/2021  Lawrence Steele 1951/04/24 [id number]                                      Medication Assistance Referral  Referral From: [name]  Medication/Company: Eliquis / BMS Patient application portion:  Mailed Provider application portion: Faxed  to  Dr. Ida Steele Provider address/fax verified via: Office website  Medication/Company: Lawrence Steele / [name] Patient application portion:  Mailed Provider application portion: Faxed  to Dr. John Giovanni, FNP/Lawrence Steele, [name] Provider address/fax verified via: Office website    Lawrence Steele, [name]  [phone]

## 2021-02-16 ENCOUNTER — Telehealth: Payer: Self-pay

## 2021-02-16 ENCOUNTER — Encounter: Payer: Self-pay | Admitting: Nurse Practitioner

## 2021-02-16 ENCOUNTER — Ambulatory Visit (INDEPENDENT_AMBULATORY_CARE_PROVIDER_SITE_OTHER): Payer: Medicare HMO | Admitting: Nurse Practitioner

## 2021-02-16 ENCOUNTER — Other Ambulatory Visit: Payer: Self-pay

## 2021-02-16 VITALS — BP 128/86 | HR 100 | Temp 97.2°F | Resp 16 | Ht 70.0 in | Wt 235.2 lb

## 2021-02-16 DIAGNOSIS — R0602 Shortness of breath: Secondary | ICD-10-CM | POA: Diagnosis not present

## 2021-02-16 DIAGNOSIS — R042 Hemoptysis: Secondary | ICD-10-CM

## 2021-02-16 DIAGNOSIS — E1165 Type 2 diabetes mellitus with hyperglycemia: Secondary | ICD-10-CM | POA: Diagnosis not present

## 2021-02-16 DIAGNOSIS — R911 Solitary pulmonary nodule: Secondary | ICD-10-CM

## 2021-02-16 LAB — POCT GLYCOSYLATED HEMOGLOBIN (HGB A1C): Hemoglobin A1C: 6 % — AB (ref 4.0–5.6)

## 2021-02-16 MED ORDER — METFORMIN HCL 500 MG PO TABS: 500.0000 mg | ORAL_TABLET | Freq: Two times a day (BID) | ORAL | 3 refills | Status: AC

## 2021-02-16 NOTE — Progress Notes (Signed)
[facility] [place], [place] [address]  Internal MEDICINE  Office Visit Note  Patient Name: Lawrence Steele  [id number]  [id number]  Date of Service: 02/19/2021  Chief Complaint  Patient presents with  . Follow-up    Review results, spitting up a little bit of blood specks over the last week or so, mostly in the morning   . Diabetes  . Hypertension  . Hyperlipidemia  . COPD  . Anxiety  . Asthma  . Quality Metric Gaps    Shingrix, foot exam    HPI Lawrence Steele presents for a follow-up visit to discuss his CT scan results.  He states that he has experienced hemoptysis daily x1 week, but he has had hemoptysis intermittently for 3 months now.  He is on 2 L of portable oxygen 24/7.  He uses CPAP at night.  He reports persistent productive cough and wheezing and denies any chest tightness.  The CT scan of the chest showed a subtotal opacification of the right lower lobe of the lungs with central cavitary focus.  The area measured 2.9 cm x 1.9 cm x 1.6 cm.  A small right pleural effusion was also identified.  He lives with his daughter who also has lung cancer and is being treated by [facility] oncology currently.  His daughter does not smoke.  He also has positive family history of lung cancer with at least one family member who was a smoker and another who was not.  He reports a weight loss of approximately 10 pounds in the past 3 months, his current weight is 235 pounds.  He reports intermittent headaches but not any worse than usual.  He reports arthritis with an increase in joint and bone pain in the past 3 months. A1c today was 6.0 which is up from 5.6 in March 2022.  Current Medication: Outpatient Encounter Medications as of 02/16/2021  Medication Sig Note  . metFORMIN (GLUCOPHAGE) 500 MG tablet Take 1 tablet (500 mg total) by mouth 2 (two) times daily with a meal.   . Accu-Chek Softclix Lancets lancets Use as instructed  Once a daily E11.65   . acetaminophen (TYLENOL)  500 MG tablet Take 1,000 mg by mouth every 6 (six) hours as needed for headache.   . Aclidinium Bromide 400 MCG/ACT AEPB Inhale 400 mg into the lungs 2 (two) times daily.   Marland Kitchen albuterol (VENTOLIN HFA) 108 (90 Base) MCG/ACT inhaler Inhale 2 puffs into the lungs every 6 (six) hours as needed for wheezing or shortness of breath.   . allopurinol (ZYLOPRIM) 100 MG tablet Take 1 tablet (100 mg total) by mouth 2 (two) times daily.   Marland Kitchen apixaban (ELIQUIS) 5 MG TABS tablet Take 1 tablet (5 mg total) by mouth 2 (two) times daily.   Marland Kitchen atenolol (TENORMIN) 25 MG tablet Take 2 tablets (50 mg) in the morning and 1 tablet (25 mg) in the evening, (Patient taking differently: Take 25-50 mg by mouth 2 (two) times daily. Take 2 tablets (50 mg) in the morning and 1 tablet (25 mg) in the evening,)   . atorvastatin (LIPITOR) 20 MG tablet Take 1 tablet by mouth once daily   . Blood Glucose Monitoring Suppl (ACCU-CHEK AVIVA PLUS) w/Device KIT Use as directed   . calcium carbonate (TUMS EX) 750 MG chewable tablet Chew by mouth.   . cyclobenzaprine (FLEXERIL) 10 MG tablet Take 1 tablet (10 mg total) by mouth 2 (two) times daily at 10 AM and 5 PM.   .  diclofenac sodium (VOLTAREN) 1 % GEL Apply 1 application topically 4 (four) times daily as needed (pain).    Marland Kitchen doxycycline (VIBRA-TABS) 100 MG tablet Take 1 tablet (100 mg total) by mouth daily.   Marland Kitchen escitalopram (LEXAPRO) 5 MG tablet Take 1 tablet (5 mg total) by mouth daily.   Marland Kitchen Fe Cbn-Fe Gluc-FA-B12-C-DSS (FERRALET 90) 90-1 MG TABS Take 1 mg by mouth daily.   . Fluticasone-Salmeterol (ADVAIR) 250-50 MCG/DOSE AEPB Inhale 1 puff into the lungs 2 (two) times daily.   . furosemide (LASIX) 20 MG tablet TAKE 2 TABLETS BY MOUTH EVERY OTHER DAY ,ALTERNATE  WITH  1  TABLET   . gabapentin (NEURONTIN) 400 MG capsule Decrease to TID for several days. If tolerated well, may decrease to twice daily.   Marland Kitchen glucose blood (ACCU-CHEK AVIVA PLUS) test strip 1 each by Other route as needed for other.  Use as instructed   . hydroxypropyl methylcellulose / hypromellose (ISOPTO TEARS / GONIOVISC) 2.5 % ophthalmic solution Place 1 drop into both eyes 4 (four) times daily as needed (eye irritation (sand like feeling)).    Marland Kitchen ipratropium (ATROVENT) 0.02 % nebulizer solution Take 2.5 mLs (0.5 mg total) by nebulization every 6 (six) hours as needed for wheezing or shortness of breath. Use every 6 hours for Shortness of breath and wheezing and as needed. J44.9, J96.11  Prescription is for 90 days   . ipratropium-albuterol (DUONEB) 0.5-2.5 (3) MG/3ML SOLN Inhale 3 mLs into the lungs every 6 (six) hours as needed.   Marland Kitchen levofloxacin (LEVAQUIN) 500 MG tablet Take 1 tablet (500 mg total) by mouth daily.   Marland Kitchen loratadine (CLARITIN) 10 MG tablet Take 10 mg by mouth daily.   . meloxicam (MOBIC) 7.5 MG tablet Take 1 tablet (7.5 mg total) by mouth 2 (two) times daily.   . nitroGLYCERIN (NITROSTAT) 0.4 MG SL tablet Place 0.4 mg under the tongue every 5 (five) minutes x 3 doses as needed for chest pain.    Marland Kitchen nystatin (MYCOSTATIN) 100000 UNIT/ML suspension Take 5 mLs (500,000 Units total) by mouth 4 (four) times daily as needed (mouth/throat irritation.).   Marland Kitchen oxyCODONE-acetaminophen (PERCOCET) 10-325 MG tablet Take 1 tablet by mouth 4 (four) times daily.   . OXYGEN Inhale 2 L into the lungs continuous.    . Roflumilast (DALIRESP) 250 MCG TABS Take 250 mcg by mouth daily.   Marland Kitchen tiotropium (SPIRIVA) 18 MCG inhalation capsule Place 18 mcg into inhaler and inhale daily.  03/11/2020: Uses interchangeably with Spiriva  . traMADol (ULTRAM) 50 MG tablet tramadol 50 mg tablet   . traZODone (DESYREL) 100 MG tablet Take 1 tablet (100 mg total) by mouth at bedtime.   Marland Kitchen umeclidinium bromide (INCRUSE ELLIPTA) 62.5 MCG/INH AEPB Inhale 1 puff into the lungs daily.   . [DISCONTINUED] metFORMIN (GLUCOPHAGE) 500 MG tablet Take 0.5 tablets (250 mg total) by mouth 2 (two) times daily with a meal. Decreased dose based on very good blood sugar  control    No facility-administered encounter medications on file as of 02/16/2021.    Surgical History: Past Surgical History:  Procedure Laterality Date  . ABDOMINAL AORTIC ANEURYSM REPAIR  03/06/2015   [place]    . AMPUTATION Left    partial amputation  . APPENDECTOMY    . CARDIAC CATHETERIZATION  2010,03/16/2003,11/04/2002  . CARDIOVERSION N/A 04/24/2018   Procedure: CARDIOVERSION;  Surgeon: Nelva Bush, MD;  Location: [facility] ORS;  Service: Cardiovascular;  Laterality: N/A;  . CARDIOVERSION N/A 09/05/2018  Procedure: CARDIOVERSION;  Surgeon: Minna Merritts, MD;  Location: [facility] ORS;  Service: Cardiovascular;  Laterality: N/A;  . CARDIOVERSION N/A 11/24/2018   Procedure: CARDIOVERSION (CATH LAB);  Surgeon: Wellington [facility], MD;  Location: [facility];  Service: Cardiovascular;  Laterality: N/A;  . CARDIOVERSION N/A 03/18/2019   Procedure: CARDIOVERSION;  Surgeon: Minna Merritts, MD;  Location: [facility];  Service: Cardiovascular;  Laterality: N/A;  . CARDIOVERSION N/A 11/13/2019   Procedure: CARDIOVERSION;  Surgeon: Minna Merritts, MD;  Location: [facility] ORS;  Service: Cardiovascular;  Laterality: N/A;  . CORONARY ANGIOPLASTY  03/16/2003   stent to the RCA  & 2/18/2004stent mid circumflex  . ELBOW SURGERY    . HERNIA REPAIR    . NASAL SINUS SURGERY    . PENILE PROSTHESIS IMPLANT  1995  . PILONIDAL CYST EXCISION    . TEE WITHOUT CARDIOVERSION N/A 04/24/2018   Procedure: TRANSESOPHAGEAL ECHOCARDIOGRAM (TEE);  Surgeon: Nelva Bush, MD;  Location: [facility] ORS;  Service: Cardiovascular;  Laterality: N/A;  . TONSILECTOMY, ADENOIDECTOMY, BILATERAL MYRINGOTOMY AND TUBES    . UMBILICAL HERNIA REPAIR      Medical History: Past Medical History:  Diagnosis Date  . AAA (abdominal aortic aneurysm) ([place])    a.  Status post endovascular repair [facility] in 2016  . Anxiety   . Asthma   . Atrial flutter ([place])    a. diagnosed 7/19; b. CHADS2VASc => 5 (CHF, HTN, age x 1, DM,  vascular disease); c. on Eliquis; d. s/p TEE/DCCV 8/19 with repeat DCCV 12/19  . Cancer (HCC)    melanoma L index finger   . Chronic diastolic CHF (congestive heart failure) ([place])   . COPD (chronic obstructive pulmonary disease) ([place])   . Coronary artery disease    a. LHC 5/10: left main 10%, mLAD-1 lesion 20%, mLAD-2 lesion 20%, D1 20%, mLCx-1 lesion 30%, mLCx-2 lesion 50%, OM1 20%, OM to 20%, LPL 20%, pRCA-1 lesion 20%, pRCA-2 lesion 20%.  EF 51% w/ mild inf HK  . Diabetes mellitus with complication ([place])   . Hyperlipidemia   . Hypertension   . Iliac artery aneurysm, bilateral ([place])   . Obstructive sleep apnea-hypopnea syndrome     Family History: Family History  Problem Relation Age of Onset  . Hypertension Mother   . Hyperlipidemia Mother   . Depression Mother   . Heart attack Father 66  . Heart disease Father   . Hypertension Father   . Hyperlipidemia Father     Social History   Socioeconomic History  . Marital status: Divorced    Spouse name: Not on file  . Number of children: 2  . Years of education: 10   . Highest education level: 10th grade  Occupational History  . Occupation: disabled   Tobacco Use  . Smoking status: Current Every Day Smoker    Packs/day: 0.25    Years: 45.00    Pack years: 11.25    Types: Cigarettes  . Smokeless tobacco: Former Systems developer    Types: Chew  . Tobacco comment: 3 to 4 cigarettes a day  Vaping Use  . Vaping Use: Never used  Substance and Sexual Activity  . Alcohol use: Not Currently  . Drug use: No  . Sexual activity: Not on file  Other Topics Concern  . Not on file  Social History Narrative  . Not on file   Social Determinants of Health   Financial Resource Strain: Not on file  Food Insecurity: No Food Insecurity  . Worried  About Running Out of Food in the Last Year: Never true  . Ran Out of Food in the Last Year: Never true  Transportation Needs: No Transportation Needs  . Lack of Transportation (Medical): No  . Lack  of Transportation (Non-Medical): No  Physical Activity: Not on file  Stress: Not on file  Social Connections: Not on file  Intimate Partner Violence: Not on file      Review of Systems  Constitutional: Positive for appetite change, fatigue and unexpected weight change.  HENT: Positive for sore throat.   Respiratory: Positive for cough, shortness of breath and wheezing. Negative for chest tightness.   Cardiovascular: Negative for chest pain.  Gastrointestinal: Negative for abdominal pain, constipation, diarrhea, nausea and vomiting.  Musculoskeletal: Positive for arthralgias.  Skin: Positive for pallor.  Neurological: Positive for headaches.    Vital Signs: BP 128/86   Pulse 100   Temp (!) 97.2 F (36.2 C)   Resp 16   Ht 5' 10"  (1.778 m)   Wt 235 lb 3.2 oz (106.7 kg)   SpO2 96% Comment: with 1 L of oxygen  BMI 33.75 kg/m    Physical Exam Vitals reviewed.  Constitutional:      General: He is not in acute distress.    Appearance: He is obese. He is ill-appearing.  HENT:     Head: Normocephalic and atraumatic.  Cardiovascular:     Rate and Rhythm: Normal rate and regular rhythm.  Pulmonary:     Effort: Accessory muscle usage present.     Breath sounds: Normal air entry. Examination of the right-upper field reveals wheezing. Examination of the left-upper field reveals wheezing. Examination of the right-middle field reveals wheezing and rhonchi. Examination of the left-middle field reveals wheezing. Examination of the right-lower field reveals rhonchi. Wheezing and rhonchi present.     Comments: Short of breath, has to pause after every 3 or 4 word when speaking. Skin:    General: Skin is warm and dry.     Capillary Refill: Capillary refill takes less than 2 seconds.  Neurological:     Mental Status: He is alert and oriented to person, place, and time.    Assessment/Plan: 1. Solitary pulmonary nodule Bronchoscopy ordered to assess pulmonary nodule and obtain possible  biopsy to rule out lung cancer. PET scan ordered to assess for mets.  - Bronchoscopy diagnostic; Future - NM PET Image Initial (PI) Whole Body; Future  2. Hemoptysis D-Dimer to rule out piulmonary embolism. CBC to check for anemia. Metabolic panel to assess for other abnormalities.  - CBC with Differential/Platelet - D-Dimer, Quantitative - CMP14+EGFR - Bronchoscopy diagnostic; Future  3. SOB (shortness of breath) Patient is on continuous oxygen, breathing has worsened over the past 3 months, continue current medications, use nebulizers and inhalers as prescribed.   4. Type 2 diabetes mellitus with hyperglycemia, without long-term current use of insulin (HCC) A1C has increased to 6.0, patient instructed to take 500 mg of metformin daily. (was taking 250 mg twice daily) - POCT HgB A1C - metFORMIN (GLUCOPHAGE) 500 MG tablet; Take 1 tablet (500 mg total) by mouth 2 (two) times daily with a meal.  Dispense: 180 tablet; Refill: 3  General Counseling: Zimir verbalizes understanding of the findings of todays visit and agrees with plan of treatment. I have discussed any further diagnostic evaluation that may be needed or ordered today. We also reviewed his medications today. he has been encouraged to call the office with any questions or concerns that should arise related  to todays visit.    Orders Placed This Encounter  Procedures  . Bronchoscopy diagnostic  . NM PET Image Initial (PI) Whole Body  . CBC with Differential/Platelet  . D-Dimer, Quantitative  . CMP14+EGFR  . POCT HgB A1C    Meds ordered this encounter  Medications  . metFORMIN (GLUCOPHAGE) 500 MG tablet    Sig: Take 1 tablet (500 mg total) by mouth 2 (two) times daily with a meal.    Dispense:  180 tablet    Refill:  3    Return in about 1 month (around 03/18/2021) for F/U, Review labs/test, Evangela Heffler PCP.   Total time spent:30 Minutes Time spent includes review of chart, medications, test results, and follow up plan  with the patient.   [place] Controlled Substance Database was reviewed by me.  This patient was seen by Jonetta Osgood, FNP-C in collaboration with Dr. Clayborn Bigness as a part of collaborative care agreement.   Leopoldo Mazzie R. Valetta Fuller, MSN, FNP-C Internal medicine

## 2021-02-16 NOTE — Telephone Encounter (Signed)
Incoming fax from [facility], needing script completed for PA with Eliquis  Form completed with Eliquis 5 mg BID, 90-day supply with 30-day refills  Form faxed to [facility] as requested

## 2021-02-19 DIAGNOSIS — R911 Solitary pulmonary nodule: Secondary | ICD-10-CM | POA: Insufficient documentation

## 2021-02-19 DIAGNOSIS — R042 Hemoptysis: Secondary | ICD-10-CM | POA: Insufficient documentation

## 2021-02-20 DIAGNOSIS — R042 Hemoptysis: Secondary | ICD-10-CM | POA: Diagnosis not present

## 2021-02-21 LAB — CBC WITH DIFFERENTIAL/PLATELET
Basophils Absolute: 0.1 10*3/uL (ref 0.0–0.2)
Basos: 1 %
EOS (ABSOLUTE): 0.2 10*3/uL (ref 0.0–0.4)
Eos: 2 %
Hematocrit: 38.9 % (ref 37.5–51.0)
Hemoglobin: 13.2 g/dL (ref 13.0–17.7)
Immature Grans (Abs): 0.1 10*3/uL (ref 0.0–0.1)
Immature Granulocytes: 1 %
Lymphocytes Absolute: 2 10*3/uL (ref 0.7–3.1)
Lymphs: 21 %
MCH: 32 pg (ref 26.6–33.0)
MCHC: 33.9 g/dL (ref 31.5–35.7)
MCV: 94 fL (ref 79–97)
Monocytes Absolute: 0.8 10*3/uL (ref 0.1–0.9)
Monocytes: 9 %
Neutrophils Absolute: 6.3 10*3/uL (ref 1.4–7.0)
Neutrophils: 66 %
Platelets: 160 10*3/uL (ref 150–450)
RBC: 4.13 x10E6/uL — ABNORMAL LOW (ref 4.14–5.80)
RDW: 15 % (ref 11.6–15.4)
WBC: 9.4 10*3/uL (ref 3.4–10.8)

## 2021-02-21 LAB — CMP14+EGFR
ALT: 6 IU/L (ref 0–44)
AST: 12 IU/L (ref 0–40)
Albumin/Globulin Ratio: 0.7 — ABNORMAL LOW (ref 1.2–2.2)
Albumin: 3.3 g/dL — ABNORMAL LOW (ref 3.8–4.8)
Alkaline Phosphatase: 106 IU/L (ref 44–121)
BUN/Creatinine Ratio: 13 (ref 10–24)
BUN: 10 mg/dL (ref 8–27)
Bilirubin Total: 0.9 mg/dL (ref 0.0–1.2)
CO2: 28 mmol/L (ref 20–29)
Calcium: 8.5 mg/dL — ABNORMAL LOW (ref 8.6–10.2)
Chloride: 90 mmol/L — ABNORMAL LOW (ref 96–106)
Creatinine, Ser: 0.77 mg/dL (ref 0.76–1.27)
Globulin, Total: 4.7 g/dL — ABNORMAL HIGH (ref 1.5–4.5)
Glucose: 146 mg/dL — ABNORMAL HIGH (ref 65–99)
Potassium: 3.9 mmol/L (ref 3.5–5.2)
Sodium: 135 mmol/L (ref 134–144)
Total Protein: 8 g/dL (ref 6.0–8.5)
eGFR: 97 mL/min/{1.73_m2} (ref >59)

## 2021-02-21 LAB — D-DIMER, QUANTITATIVE: D-DIMER: 0.89 mg/L FEU — ABNORMAL HIGH (ref 0.00–0.49)

## 2021-02-27 ENCOUNTER — Ambulatory Visit: Admission: RE | Admit: 2021-02-27 | Payer: Medicare HMO | Source: Ambulatory Visit

## 2021-02-27 ENCOUNTER — Telehealth: Payer: Self-pay | Admitting: Pharmacy Technician

## 2021-02-27 DIAGNOSIS — Z596 Low income: Secondary | ICD-10-CM

## 2021-02-27 NOTE — Progress Notes (Signed)
[facility])                                            [place] Team    02/27/2021  Lawrence Steele 1951/08/14 [id number]   Received both patient and provider portion(s) of patient assistance application(s) for Eliquis, Incruse and Advair. Faxed completed application and required documents into [employer] and [employer] for the latter 2.    [name] [name], [place]  [phone]

## 2021-02-28 ENCOUNTER — Telehealth: Payer: Self-pay

## 2021-02-28 NOTE — Telephone Encounter (Signed)
Pt was approved for PA for Eliquis from 02/27/2021-09/16/2021  Case #: [id number]

## 2021-03-02 ENCOUNTER — Other Ambulatory Visit: Payer: Self-pay

## 2021-03-02 NOTE — Patient Outreach (Addendum)
[facility]) Care Management  [place]  03/02/2021   Lawrence Steele [birth year] [id number]  Subjective: Telephone call to patient for disease management follow up. Patient reports he is doing well just staying in from the heat.  Sugars doing good ranging 105-125.  Discussed diabetes management.  Objective:   Encounter Medications:  Outpatient Encounter Medications as of 03/02/2021  Medication Sig Note   Accu-Chek Softclix Lancets lancets Use as instructed  Once a daily E11.65    acetaminophen (TYLENOL) 500 MG tablet Take 1,000 mg by mouth every 6 (six) hours as needed for headache.    Aclidinium Bromide 400 MCG/ACT AEPB Inhale 400 mg into the lungs 2 (two) times daily.    albuterol (VENTOLIN HFA) 108 (90 Base) MCG/ACT inhaler Inhale 2 puffs into the lungs every 6 (six) hours as needed for wheezing or shortness of breath.    allopurinol (ZYLOPRIM) 100 MG tablet Take 1 tablet (100 mg total) by mouth 2 (two) times daily.    apixaban (ELIQUIS) 5 MG TABS tablet Take 1 tablet (5 mg total) by mouth 2 (two) times daily.    atenolol (TENORMIN) 25 MG tablet Take 2 tablets (50 mg) in the morning and 1 tablet (25 mg) in the evening, (Patient taking differently: Take 25-50 mg by mouth 2 (two) times daily. Take 2 tablets (50 mg) in the morning and 1 tablet (25 mg) in the evening,)    atorvastatin (LIPITOR) 20 MG tablet Take 1 tablet by mouth once daily    Blood Glucose Monitoring Suppl (ACCU-CHEK AVIVA PLUS) w/Device KIT Use as directed    calcium carbonate (TUMS EX) 750 MG chewable tablet Chew by mouth.    cyclobenzaprine (FLEXERIL) 10 MG tablet Take 1 tablet (10 mg total) by mouth 2 (two) times daily at 10 AM and 5 PM.    diclofenac sodium (VOLTAREN) 1 % GEL Apply 1 application topically 4 (four) times daily as needed (pain).     doxycycline (VIBRA-TABS) 100 MG tablet Take 1 tablet (100 mg total) by mouth daily.    escitalopram (LEXAPRO) 5 MG tablet Take 1 tablet (5 mg  total) by mouth daily.    Fe Cbn-Fe Gluc-FA-B12-C-DSS (FERRALET 90) 90-1 MG TABS Take 1 mg by mouth daily.    Fluticasone-Salmeterol (ADVAIR) 250-50 MCG/DOSE AEPB Inhale 1 puff into the lungs 2 (two) times daily.    furosemide (LASIX) 20 MG tablet TAKE 2 TABLETS BY MOUTH EVERY OTHER DAY ,ALTERNATE  WITH  1  TABLET    gabapentin (NEURONTIN) 400 MG capsule Decrease to TID for several days. If tolerated well, may decrease to twice daily.    glucose blood (ACCU-CHEK AVIVA PLUS) test strip 1 each by Other route as needed for other. Use as instructed    hydroxypropyl methylcellulose / hypromellose (ISOPTO TEARS / GONIOVISC) 2.5 % ophthalmic solution Place 1 drop into both eyes 4 (four) times daily as needed (eye irritation (sand like feeling)).     ipratropium (ATROVENT) 0.02 % nebulizer solution Take 2.5 mLs (0.5 mg total) by nebulization every 6 (six) hours as needed for wheezing or shortness of breath. Use every 6 hours for Shortness of breath and wheezing and as needed. J44.9, J96.11  Prescription is for 90 days    ipratropium-albuterol (DUONEB) 0.5-2.5 (3) MG/3ML SOLN Inhale 3 mLs into the lungs every 6 (six) hours as needed.    levofloxacin (LEVAQUIN) 500 MG tablet Take 1 tablet (500 mg total) by mouth daily.    loratadine (CLARITIN) 10 MG  tablet Take 10 mg by mouth daily.    meloxicam (MOBIC) 7.5 MG tablet Take 1 tablet (7.5 mg total) by mouth 2 (two) times daily.    metFORMIN (GLUCOPHAGE) 500 MG tablet Take 1 tablet (500 mg total) by mouth 2 (two) times daily with a meal.    nitroGLYCERIN (NITROSTAT) 0.4 MG SL tablet Place 0.4 mg under the tongue every 5 (five) minutes x 3 doses as needed for chest pain.     nystatin (MYCOSTATIN) 100000 UNIT/ML suspension Take 5 mLs (500,000 Units total) by mouth 4 (four) times daily as needed (mouth/throat irritation.).    oxyCODONE-acetaminophen (PERCOCET) 10-325 MG tablet Take 1 tablet by mouth 4 (four) times daily.    OXYGEN Inhale 2 L into the lungs  continuous.     Roflumilast (DALIRESP) 250 MCG TABS Take 250 mcg by mouth daily.    tiotropium (SPIRIVA) 18 MCG inhalation capsule Place 18 mcg into inhaler and inhale daily.  03/11/2020: Uses interchangeably with Spiriva   traMADol (ULTRAM) 50 MG tablet tramadol 50 mg tablet    traZODone (DESYREL) 100 MG tablet Take 1 tablet (100 mg total) by mouth at bedtime.    umeclidinium bromide (INCRUSE ELLIPTA) 62.5 MCG/INH AEPB Inhale 1 puff into the lungs daily.    No facility-administered encounter medications on file as of 03/02/2021.    Functional Status:  In your present state of health, do you have any difficulty performing the following activities: 03/02/2021 09/01/2020  Hearing? N N  Vision? N N  Difficulty concentrating or making decisions? N N  Walking or climbing stairs? N N  Dressing or bathing? N N  Doing errands, shopping? N N  Preparing Food and eating ? N N  Using the Toilet? N N  In the past six months, have you accidently leaked urine? N N  Do you have problems with loss of bowel control? N N  Managing your Medications? N N  Managing your Finances? N N  Housekeeping or managing your Housekeeping? N N  Some recent data might be hidden    Fall/Depression Screening: Fall Risk  03/02/2021 02/16/2021 11/15/2020  Falls in the past year? 1 1 0  Comment - - -  Number falls in past yr: 1 1 -  Injury with Fall? 0 0 -  Comment - - -  Risk for fall due to : - - -  Follow up Falls prevention discussed Falls evaluation completed -   PHQ 2/9 Scores 03/02/2021 02/16/2021 11/15/2020 09/01/2020 08/29/2020 08/05/2020 03/29/2020  PHQ - 2 Score 0 0 0 0 0 0 0  PHQ- 9 Score - - - - - - -    Assessment:  Patient Care Plan: Diabetes Type 2 (Adult)     Problem Identified: Disease Progression (Diabetes, Type 2)      Long-Range Goal: Disease Progression Prevented or Minimized as evidenced by A1c less than 7   Start Date: 09/01/2020  Expected End Date: 09/16/2021  This Visit's Progress: On track   Priority: High  Note:   Evidence-based guidance:  Encourage lifestyle changes, such as increased intake of plant-based foods, stress reduction, consistent physical activity and smoking cessation to prevent long-term complications and chronic disease.   Individualize activity and exercise recommendations while considering potential limitations, such as neuropathy, retinopathy or the ability to prevent hyperglycemia or hypoglycemia.   Notes:     Task: Monitor and Manage Follow-up for Comorbidities   Due Date: 09/16/2021  Priority: Routine  Responsible User: Jon Billings, RN  Note:  Care Management Activities:    - healthy lifestyle promoted - response to pharmacologic therapy monitored    Notes: 03/02/21 Patient managing diabetes well. Patient last A1c was 6.      Goals Addressed             This Visit's Progress    COMPLETED: THN-Make and Keep All Appointments   On track    Timeframe:  Long-Range Goal Priority:  High Start Date:    09/01/20                         Expected End Date: 12/15/20                      Follow Up Date 12/15/20   - keep a calendar with appointment dates - use public transportation    Why is this important?   Part of staying healthy is seeing the doctor for follow-up care.  If you forget your appointments, there are some things you can do to stay on track.    Notes:       THN-Monitor and Manage My Blood Sugar-Diabetes Type 2   On track    Barriers: Health Behaviors Timeframe:  Long-Range Goal Priority:  High Start Date:  09/01/20                           Expected End Date:    09/16/21   Follow Up Date 06/16/21  - check blood sugar at prescribed times - check blood sugar if I feel it is too high or too low    Why is this important?   Checking your blood sugar at home helps to keep it from getting very high or very low.  Writing the results in a diary or log helps the doctor know how to care for you.  Your blood sugar log should  have the time, date and the results.  Also, write down the amount of insulin or other medicine that you take.  Other information, like what you ate, exercise done and how you were feeling, will also be helpful.     Notes: 03/02/21 Continue with diabetes management strategies.  You are doing well.  Diabetes Management Discussed: Medication adherence Reviewed importance of limiting carbs such as rice, potatoes, breads, and pastas. Also discussed limiting sweets and sugary drinks.  Discussed importance of portion control.  Also discussed importance of annual exams such as eye exam.          COMPLETED: THN-Obtain Eye Exam-Diabetes Type 2   On track    Timeframe:  Short-Term Goal Priority:  High Start Date:   09/01/20                          Expected End Date:   12/15/20                    Follow Up Date 12/15/20   - keep appointment with eye doctor    Why is this important?   Eye check-ups are important when you have diabetes.  Vision loss can be prevented.    Notes: 03/02/21 eye exam 09-21-20      COMPLETED: THN-Prevent Falls and Injury   On track    Timeframe:  Short-Term Goal Priority:  High Start Date:     09/01/20  Expected End Date:  12/15/20                     Follow Up Date 12/15/20   - learn how to get back up if I fall - use a nonslip pad with throw rugs, or remove them completely - wear my glasses and/or hearing aid    Why is this important?   Most falls happen when it is hard for you to walk safely. Your balance may be off because of an illness. You may have pain in your knees, hip or other joints.  You may be overly tired or taking medicines that make you sleepy. You may not be able to see or hear clearly.  Falls can lead to broken bones, bruises or other injuries.  There are things you can do to help prevent falling.     Notes:             Plan:  Follow-up: Patient agrees to Care Plan and Follow-up. Follow-up in 3  month(s)

## 2021-03-02 NOTE — Patient Instructions (Signed)
Goals Addressed             This Visit's Progress    COMPLETED: THN-Make and Keep All Appointments   On track    Timeframe:  Long-Range Goal Priority:  High Start Date:    09/01/20                         Expected End Date: 12/15/20                      Follow Up Date 12/15/20   - keep a calendar with appointment dates - use public transportation    Why is this important?   Part of staying healthy is seeing the doctor for follow-up care.  If you forget your appointments, there are some things you can do to stay on track.    Notes:       THN-Monitor and Manage My Blood Sugar-Diabetes Type 2   On track    Barriers: Health Behaviors Timeframe:  Long-Range Goal Priority:  High Start Date:  09/01/20                           Expected End Date:    09/16/21   Follow Up Date 06/16/21  - check blood sugar at prescribed times - check blood sugar if I feel it is too high or too low    Why is this important?   Checking your blood sugar at home helps to keep it from getting very high or very low.  Writing the results in a diary or log helps the doctor know how to care for you.  Your blood sugar log should have the time, date and the results.  Also, write down the amount of insulin or other medicine that you take.  Other information, like what you ate, exercise done and how you were feeling, will also be helpful.     Notes: 03/02/21 Continue with diabetes management strategies.  You are doing well.  Diabetes Management Discussed: Medication adherence Reviewed importance of limiting carbs such as rice, potatoes, breads, and pastas. Also discussed limiting sweets and sugary drinks.  Discussed importance of portion control.  Also discussed importance of annual exams such as mammogram and eye exam.          COMPLETED: THN-Obtain Eye Exam-Diabetes Type 2   On track    Timeframe:  Short-Term Goal Priority:  High Start Date:   09/01/20                          Expected End Date:    12/15/20                    Follow Up Date 12/15/20   - keep appointment with eye doctor    Why is this important?   Eye check-ups are important when you have diabetes.  Vision loss can be prevented.    Notes: 03/02/21 eye exam 09-21-20      COMPLETED: THN-Prevent Falls and Injury   On track    Timeframe:  Short-Term Goal Priority:  High Start Date:     09/01/20                        Expected End Date:  12/15/20  Follow Up Date 12/15/20   - learn how to get back up if I fall - use a nonslip pad with throw rugs, or remove them completely - wear my glasses and/or hearing aid    Why is this important?   Most falls happen when it is hard for you to walk safely. Your balance may be off because of an illness. You may have pain in your knees, hip or other joints.  You may be overly tired or taking medicines that make you sleepy. You may not be able to see or hear clearly.  Falls can lead to broken bones, bruises or other injuries.  There are things you can do to help prevent falling.     Notes:

## 2021-03-03 ENCOUNTER — Telehealth: Payer: Self-pay | Admitting: Pharmacy Technician

## 2021-03-03 DIAGNOSIS — Z596 Low income: Secondary | ICD-10-CM

## 2021-03-03 NOTE — Progress Notes (Signed)
[facility])                                            [place] Team    03/03/2021  Lawrence Steele October 02, 1950 [id number]   2 care coordination calls placed today to [employer] in regards to Eliquis application and [employer] in regards to Advair Diskus and Incruse application.  Spoke to [place] at [facility] who informed patient was APPROVED 02/27/2021-09/16/2021. Medication will be delivered to the patient's home.  Spoke to [place] at [facility] who informed patient was APPROVED 03/01/21-09/16/21. Medications will be delivered to the patient's home.  [name] P. [name], [place]  [phone]

## 2021-03-09 ENCOUNTER — Ambulatory Visit
Admission: RE | Admit: 2021-03-09 | Discharge: 2021-03-09 | Disposition: A | Discharge status: Home / Self Care | Payer: Medicare HMO | Source: Ambulatory Visit | Attending: Internal Medicine | Admitting: Internal Medicine

## 2021-03-09 ENCOUNTER — Other Ambulatory Visit: Payer: Self-pay

## 2021-03-09 DIAGNOSIS — I700 Atherosclerosis of aorta: Secondary | ICD-10-CM | POA: Insufficient documentation

## 2021-03-09 DIAGNOSIS — R911 Solitary pulmonary nodule: Secondary | ICD-10-CM | POA: Diagnosis not present

## 2021-03-09 LAB — GLUCOSE, CAPILLARY: Glucose-Capillary: 122 mg/dL — ABNORMAL HIGH (ref 70–99)

## 2021-03-09 MED ORDER — FLUDEOXYGLUCOSE F - 18 (FDG) INJECTION
12.3900 | Freq: Once | INTRAVENOUS | Status: AC | PRN
  Administered 2021-03-09: 12.39 via INTRAVENOUS

## 2021-03-10 ENCOUNTER — Other Ambulatory Visit: Payer: Self-pay

## 2021-03-10 ENCOUNTER — Telehealth: Payer: Self-pay

## 2021-03-10 DIAGNOSIS — J449 Chronic obstructive pulmonary disease, unspecified: Secondary | ICD-10-CM

## 2021-03-10 MED ORDER — DALIRESP 250 MCG PO TABS
250.0000 ug | ORAL_TABLET | Freq: Every day | ORAL | 4 refills | Status: AC
Start: 2021-03-10 — End: ?

## 2021-03-10 NOTE — Telephone Encounter (Signed)
Daliresp was e-prescribed to : [facility], [address] [place] [address]  Phone:  [phone]  Fax:  [phone]

## 2021-03-10 NOTE — Telephone Encounter (Signed)
Spoke to pt and he advised he needed refill on his daliresp thru [facility].  I called and spoke to a representative and they advised I could send in a e-prescribed rx for Daliresp.  I called pt back and informed him to check with the pharmacy/company with [employer] to make sure his rx went thru.  Also left VM on daughters VM

## 2021-03-10 NOTE — Telephone Encounter (Signed)
Called Jill Simcox with [facility] care management and asked if pt still needed paperwork for PT Asst for TUDORZA inhaler, per Sharee Pimple pt didn't want to switch to that inhaler and wanted to continue with the ADVAIR and INCRUSE.  Both new prescriptions were completed and faxed to [facility] on 02/16/21.  As of today when I spoke to [place], she did receive the paperwork with the new prescriptions.

## 2021-03-11 DIAGNOSIS — J449 Chronic obstructive pulmonary disease, unspecified: Secondary | ICD-10-CM | POA: Diagnosis not present

## 2021-03-17 ENCOUNTER — Other Ambulatory Visit: Payer: Self-pay

## 2021-03-17 ENCOUNTER — Ambulatory Visit (INDEPENDENT_AMBULATORY_CARE_PROVIDER_SITE_OTHER): Payer: Medicare HMO | Admitting: Nurse Practitioner

## 2021-03-17 ENCOUNTER — Encounter: Payer: Self-pay | Admitting: Nurse Practitioner

## 2021-03-17 VITALS — BP 103/60 | HR 55 | Temp 98.1°F | Resp 16 | Ht 70.0 in | Wt 226.8 lb

## 2021-03-17 DIAGNOSIS — R0602 Shortness of breath: Secondary | ICD-10-CM | POA: Diagnosis not present

## 2021-03-17 DIAGNOSIS — R948 Abnormal results of function studies of other organs and systems: Secondary | ICD-10-CM

## 2021-03-17 DIAGNOSIS — I9589 Other hypotension: Secondary | ICD-10-CM | POA: Diagnosis not present

## 2021-03-17 DIAGNOSIS — R042 Hemoptysis: Secondary | ICD-10-CM | POA: Diagnosis not present

## 2021-03-17 DIAGNOSIS — R911 Solitary pulmonary nodule: Secondary | ICD-10-CM

## 2021-03-17 MED ORDER — PREDNISONE 10 MG PO TABS: ORAL_TABLET | ORAL | 0 refills | Status: DC

## 2021-03-17 NOTE — Progress Notes (Signed)
[facility] [place],  [address]  Internal MEDICINE  Office Visit Note  Patient Name: Lawrence Steele  [id number]  [id number]  Date of Service: 03/17/2021  Chief Complaint  Patient presents with   Follow-up    Review labs, patient has been not been feeling well at all, patient states he normally stay in bed because he's always cold.     Quality Metric Gaps    Foot exam    HPI Lawrence Steele presents for a follow up visit to discuss discuss test results. He is also not feeling well, laying in bed most of the day, fatigued, always feels cold, still coughing up blood on a daily basis per patient report. He is still an every day cigarette smoker. His blood pressure is still low and he is bradycardic at 55 bpm. He had the PET scan done and lab work. The bronchoscopy has not been done yet and Lawrence Steele is upset about this and wants to get it done soon.     Current Medication: Outpatient Encounter Medications as of 03/17/2021  Medication Sig Note   Accu-Chek Softclix Lancets lancets Use as instructed  Once a daily E11.65    acetaminophen (TYLENOL) 500 MG tablet Take 1,000 mg by mouth every 6 (six) hours as needed for headache.    Aclidinium Bromide 400 MCG/ACT AEPB Inhale 400 mg into the lungs 2 (two) times daily.    albuterol (VENTOLIN HFA) 108 (90 Base) MCG/ACT inhaler Inhale 2 puffs into the lungs every 6 (six) hours as needed for wheezing or shortness of breath.    allopurinol (ZYLOPRIM) 100 MG tablet Take 1 tablet (100 mg total) by mouth 2 (two) times daily.    apixaban (ELIQUIS) 5 MG TABS tablet Take 1 tablet (5 mg total) by mouth 2 (two) times daily.    atenolol (TENORMIN) 25 MG tablet Take 2 tablets (50 mg) in the morning and 1 tablet (25 mg) in the evening, (Patient taking differently: Take 25-50 mg by mouth 2 (two) times daily. Take 2 tablets (50 mg) in the morning and 1 tablet (25 mg) in the evening,)    atorvastatin (LIPITOR) 20 MG tablet Take 1 tablet by mouth  once daily    Blood Glucose Monitoring Suppl (ACCU-CHEK AVIVA PLUS) w/Device KIT Use as directed    calcium carbonate (TUMS EX) 750 MG chewable tablet Chew by mouth.    cyclobenzaprine (FLEXERIL) 10 MG tablet Take 1 tablet (10 mg total) by mouth 2 (two) times daily at 10 AM and 5 PM.    diclofenac sodium (VOLTAREN) 1 % GEL Apply 1 application topically 4 (four) times daily as needed (pain).     doxycycline (VIBRA-TABS) 100 MG tablet Take 1 tablet (100 mg total) by mouth daily.    escitalopram (LEXAPRO) 5 MG tablet Take 1 tablet (5 mg total) by mouth daily.    Fe Cbn-Fe Gluc-FA-B12-C-DSS (FERRALET 90) 90-1 MG TABS Take 1 mg by mouth daily.    Fluticasone-Salmeterol (ADVAIR) 250-50 MCG/DOSE AEPB Inhale 1 puff into the lungs 2 (two) times daily.    furosemide (LASIX) 20 MG tablet TAKE 2 TABLETS BY MOUTH EVERY OTHER DAY ,ALTERNATE  WITH  1  TABLET    gabapentin (NEURONTIN) 400 MG capsule Decrease to TID for several days. If tolerated well, may decrease to twice daily.    glucose blood (ACCU-CHEK AVIVA PLUS) test strip 1 each by Other route as needed for other. Use as instructed    hydroxypropyl methylcellulose / hypromellose (ISOPTO  TEARS / GONIOVISC) 2.5 % ophthalmic solution Place 1 drop into both eyes 4 (four) times daily as needed (eye irritation (sand like feeling)).     ipratropium (ATROVENT) 0.02 % nebulizer solution Take 2.5 mLs (0.5 mg total) by nebulization every 6 (six) hours as needed for wheezing or shortness of breath. Use every 6 hours for Shortness of breath and wheezing and as needed. J44.9, J96.11  Prescription is for 90 days    ipratropium-albuterol (DUONEB) 0.5-2.5 (3) MG/3ML SOLN Inhale 3 mLs into the lungs every 6 (six) hours as needed.    levofloxacin (LEVAQUIN) 500 MG tablet Take 1 tablet (500 mg total) by mouth daily.    loratadine (CLARITIN) 10 MG tablet Take 10 mg by mouth daily.    meloxicam (MOBIC) 7.5 MG tablet Take 1 tablet (7.5 mg total) by mouth 2 (two) times daily.     metFORMIN (GLUCOPHAGE) 500 MG tablet Take 1 tablet (500 mg total) by mouth 2 (two) times daily with a meal.    nitroGLYCERIN (NITROSTAT) 0.4 MG SL tablet Place 0.4 mg under the tongue every 5 (five) minutes x 3 doses as needed for chest pain.     nystatin (MYCOSTATIN) 100000 UNIT/ML suspension Take 5 mLs (500,000 Units total) by mouth 4 (four) times daily as needed (mouth/throat irritation.).    oxyCODONE-acetaminophen (PERCOCET) 10-325 MG tablet Take 1 tablet by mouth 4 (four) times daily.    OXYGEN Inhale 2 L into the lungs continuous.     predniSONE (DELTASONE) 10 MG tablet Take one tab 3 x day for 3 days, then take one tab 2 x a day for 3 days and then take one tab a day for 3 days for copd    Roflumilast (DALIRESP) 250 MCG TABS Take 250 mcg by mouth daily.    tiotropium (SPIRIVA) 18 MCG inhalation capsule Place 18 mcg into inhaler and inhale daily.  03/11/2020: Uses interchangeably with Spiriva   traMADol (ULTRAM) 50 MG tablet tramadol 50 mg tablet    traZODone (DESYREL) 100 MG tablet Take 1 tablet (100 mg total) by mouth at bedtime.    umeclidinium bromide (INCRUSE ELLIPTA) 62.5 MCG/INH AEPB Inhale 1 puff into the lungs daily.    No facility-administered encounter medications on file as of 03/17/2021.    Surgical History: Past Surgical History:  Procedure Laterality Date   ABDOMINAL AORTIC ANEURYSM REPAIR  03/06/2015   [facility]    ADENOIDECTOMY     AMPUTATION Left    partial amputation   APPENDECTOMY     CARDIAC CATHETERIZATION  2010,03/16/2003,11/04/2002   CARDIOVERSION N/A 04/24/2018   Procedure: CARDIOVERSION;  Surgeon: Nelva Bush, MD;  Location: [facility];  Service: Cardiovascular;  Laterality: N/A;   CARDIOVERSION N/A 09/05/2018   Procedure: CARDIOVERSION;  Surgeon: Minna Merritts, MD;  Location: [facility];  Service: Cardiovascular;  Laterality: N/A;   CARDIOVERSION N/A 11/24/2018   Procedure: CARDIOVERSION (CATH LAB);  Surgeon: Wellington Hampshire, MD;  Location: [facility];   Service: Cardiovascular;  Laterality: N/A;   CARDIOVERSION N/A 03/18/2019   Procedure: CARDIOVERSION;  Surgeon: Minna Merritts, MD;  Location: [facility];  Service: Cardiovascular;  Laterality: N/A;   CARDIOVERSION N/A 11/13/2019   Procedure: CARDIOVERSION;  Surgeon: Minna Merritts, MD;  Location: [facility];  Service: Cardiovascular;  Laterality: N/A;   CORONARY ANGIOPLASTY  03/16/2003   stent to the RCA  & 2/18/2004stent mid circumflex   [place]  PENILE PROSTHESIS IMPLANT  1995   PILONIDAL CYST EXCISION     TEE WITHOUT CARDIOVERSION N/A 04/24/2018   Procedure: TRANSESOPHAGEAL ECHOCARDIOGRAM (TEE);  Surgeon: Nelva Bush, MD;  Location: [facility];  Service: Cardiovascular;  Laterality: N/A;   TONSILECTOMY, ADENOIDECTOMY, BILATERAL MYRINGOTOMY AND TUBES     UMBILICAL HERNIA REPAIR      Medical History: Past Medical History:  Diagnosis Date   AAA (abdominal aortic aneurysm) ([place])    a.  Status post endovascular repair at [facility] in 2016   Anxiety    Asthma    Atrial flutter ([place])    a. diagnosed 7/19; b. CHADS2VASc => 5 (CHF, HTN, age x 1, DM, vascular disease); c. on Eliquis; d. s/p TEE/DCCV 8/19 with repeat DCCV 12/19   Cancer ([place])    melanoma L index finger    Chronic diastolic CHF (congestive heart failure) (HCC)    COPD (chronic obstructive pulmonary disease) ([place])    Coronary artery disease    a. LHC 5/10: left main 10%, mLAD-1 lesion 20%, mLAD-2 lesion 20%, D1 20%, mLCx-1 lesion 30%, mLCx-2 lesion 50%, OM1 20%, OM to 20%, LPL 20%, pRCA-1 lesion 20%, pRCA-2 lesion 20%.  EF 51% w/ mild inf HK   Diabetes mellitus with complication (HCC)    Hyperlipidemia    Hypertension    Iliac artery aneurysm, bilateral (HCC)    Obstructive sleep apnea-hypopnea syndrome     Family History: Family History  Problem Relation Age of Onset   Hypertension Mother    Hyperlipidemia Mother    Depression Mother    Heart attack Father 68   Heart  disease Father    Hypertension Father    Hyperlipidemia Father     Social History   Socioeconomic History   Marital status: Divorced    Spouse name: Not on file   Number of children: 2   Years of education: 10    Highest education level: 10th grade  Occupational History   Occupation: disabled   Tobacco Use   Smoking status: Every Day    Packs/day: 0.25    Years: 45.00    Pack years: 11.25    Types: Cigarettes   Smokeless tobacco: Former    Types: Chew   Tobacco comments:    3 to 4 cigarettes a day  Vaping Use   Vaping Use: Never used  Substance and Sexual Activity   Alcohol use: Not Currently   Drug use: No   Sexual activity: Not on file  Other Topics Concern   Not on file  Social History Narrative   Not on file   Social Determinants of Health   Financial Resource Strain: Not on file  Food Insecurity: No Food Insecurity   Worried About Charity fundraiser in the Last Year: Never true   Ran Out of Food in the Last Year: Never true  Transportation Needs: No Transportation Needs   Lack of Transportation (Medical): No   Lack of Transportation (Non-Medical): No  Physical Activity: Not on file  Stress: Not on file  Social Connections: Not on file  Intimate Partner Violence: Not on file      Review of Systems  Constitutional:  Positive for appetite change, chills, fatigue and unexpected weight change (has lost 9 lbs in the past month). Negative for fever.  HENT: Negative.    Respiratory:  Positive for cough, chest tightness, shortness of breath and wheezing.        Hemoptysis  Cardiovascular:  Negative for chest pain and  palpitations.  Gastrointestinal: Negative.  Negative for abdominal pain, constipation, diarrhea, nausea and vomiting.  Genitourinary: Negative.   Musculoskeletal:  Positive for arthralgias.  Neurological:  Negative for dizziness, light-headedness and headaches.  Psychiatric/Behavioral:  Positive for behavioral problems (depressed mood).  Negative for self-injury and suicidal ideas.    Vital Signs: BP (!) 93/58   Pulse (!) 55   Temp 98.1 F (36.7 C)   Resp 16   Ht 5' 10"  (1.778 m)   Wt 226 lb 12.8 oz (102.9 kg)   SpO2 98%   BMI 32.54 kg/m    Physical Exam Constitutional:      General: He is not in acute distress.    Appearance: He is obese. He is ill-appearing.  HENT:     Head: Normocephalic and atraumatic.  Cardiovascular:     Rate and Rhythm: Normal rate and regular rhythm.     Pulses: Normal pulses.     Heart sounds: Normal heart sounds. No murmur heard. Pulmonary:     Effort: Accessory muscle usage present.     Breath sounds: Decreased air movement present. Examination of the right-upper field reveals wheezing. Examination of the left-upper field reveals wheezing. Examination of the right-middle field reveals wheezing. Examination of the left-middle field reveals wheezing. Examination of the right-lower field reveals rhonchi. Wheezing and rhonchi present.  Skin:    General: Skin is warm and dry.     Capillary Refill: Capillary refill takes less than 2 seconds.  Neurological:     Mental Status: He is alert and oriented to person, place, and time.  Psychiatric:        Mood and Affect: Mood is depressed. Affect is angry.     Assessment/Plan: 1. Abnormal positron emission tomography (PET) scan -discussed results with patient. Still waiting for brochoscopy with biopsy to be done to determine what the mass in the right lung is and rule out or confirm malignancy. If malignancy is confirmed, the PET scan has already identified hypermetabolic lymph nodes in the inferior right hilum which could be possible metastasis. Other incidental findings were mentioned in the report as well but none of those findings need immediate intervention. Will reach out to Dr. Devona Konig about the Bronchoscopy and/or the scheduling staff to ensure Lawrence Steele gets a date for the bronchoscopy which was ordered at his last office visit.  PET  scan results: 1. Marked hypermetabolism in the masslike opacification of the right lower lobe with central photopenia indicating necrosis. The most hypermetabolic component of this lesion is in the paraspinal medial right lower lobe. Imaging features concerning for neoplastic etiology. If possible, repeat CT chest with contrast may prove helpful to further delineate soft tissue disease. 2. Hypermetabolic lymph nodes in the inferior right hilum and subcarinal station are concerning for metastatic involvement. 3. No unexpected or suspicious hypermetabolic uptake in the neck, abdomen, or pelvis. 4. 14 mm right adrenal nodule has low attenuation consistent with adenoma. No hypermetabolism. 5. Nodular liver contour compatible with cirrhosis. 6.  Aortic Atherosclerois (ICD10-170.0)  2. Solitary pulmonary nodule Bronchoscopy previously ordered, waiting for the procedure to be scheduled.   3. Hemoptysis Lawrence Steele reports daily hemoptysis now. This has been happening for 4 months now.   4. SOB (shortness of breath) Course of prednisone ordered for patient to decrease inflammation and ease his breathing. He continues to have SOB, wheezing and cough.  - predniSONE (DELTASONE) 10 MG tablet; Take one tab 3 x day for 3 days, then take one tab 2 x a day  for 3 days and then take one tab a day for 3 days for copd  Dispense: 18 tablet; Refill: 0  5. Iatrogenic hypotension Monitor BP at home, reduce b blocker if needed    General Counseling: Lawrence Steele verbalizes understanding of the findings of todays visit and agrees with plan of treatment. I have discussed any further diagnostic evaluation that may be needed or ordered today. We also reviewed his medications today. he has been encouraged to call the office with any questions or concerns that should arise related to todays visit.    No orders of the defined types were placed in this encounter.   Meds ordered this encounter  Medications   predniSONE  (DELTASONE) 10 MG tablet    Sig: Take one tab 3 x day for 3 days, then take one tab 2 x a day for 3 days and then take one tab a day for 3 days for copd    Dispense:  18 tablet    Refill:  0    Return for previously scheduled follow up.   Total time spent:30 Minutes Time spent includes review of chart, medications, test results, and follow up plan with the patient.   Lawrence Steele Controlled Substance Database was reviewed by me.  This patient was seen by Jonetta Osgood, FNP-C in collaboration with Dr. Clayborn Bigness as a part of collaborative care agreement.   Arrie Zuercher R. Valetta Fuller, MSN, FNP-C Internal medicine

## 2021-03-27 ENCOUNTER — Other Ambulatory Visit: Payer: Self-pay

## 2021-03-27 ENCOUNTER — Other Ambulatory Visit: Payer: Self-pay | Admitting: Internal Medicine

## 2021-03-27 ENCOUNTER — Telehealth: Payer: Self-pay

## 2021-03-27 DIAGNOSIS — R948 Abnormal results of function studies of other organs and systems: Secondary | ICD-10-CM

## 2021-03-27 MED ORDER — ATENOLOL 25 MG PO TABS: 25.0000 mg | ORAL_TABLET | Freq: Every day | ORAL | 1 refills | Status: AC

## 2021-03-27 NOTE — Telephone Encounter (Signed)
Called and spoke to pt and asked if he has a Bp machine to monitor his Bp.  Per DFK she wants pt to keep check of his Bp due to his Bp and HR being low.   Pt informed me he has a Bp machine and I advised him to monitor his Bp daily and bring his readings to his next appt.   I also asked pt how much Atenolol he is taking and he advised that he only takes one 25 mg tablet daily now.

## 2021-03-27 NOTE — Progress Notes (Signed)
bron

## 2021-03-29 ENCOUNTER — Telehealth: Payer: Self-pay

## 2021-03-29 ENCOUNTER — Other Ambulatory Visit: Payer: Self-pay

## 2021-03-29 DIAGNOSIS — Z79891 Long term (current) use of opiate analgesic: Secondary | ICD-10-CM | POA: Diagnosis not present

## 2021-03-29 DIAGNOSIS — M47896 Other spondylosis, lumbar region: Secondary | ICD-10-CM | POA: Diagnosis not present

## 2021-03-29 DIAGNOSIS — M545 Low back pain, unspecified: Secondary | ICD-10-CM | POA: Diagnosis not present

## 2021-03-29 DIAGNOSIS — Z79899 Other long term (current) drug therapy: Secondary | ICD-10-CM | POA: Diagnosis not present

## 2021-03-29 DIAGNOSIS — Z5181 Encounter for therapeutic drug level monitoring: Secondary | ICD-10-CM | POA: Diagnosis not present

## 2021-03-29 DIAGNOSIS — G894 Chronic pain syndrome: Secondary | ICD-10-CM | POA: Diagnosis not present

## 2021-03-29 NOTE — Telephone Encounter (Signed)
Attempted to schedule patient at [facility] for Bronch. Employee that usually schedules this particular procedure is on vacation. I spoke with an employee named Larene Beach who was unfamiliar with the scheduling process of the Bronchoscopies and she was going to figure out what all was needed and give me a call back. A staff member in the OR ended up calling me back and gave me a number to call to  get instructions on how to have the case put in the [place] which is what Tammy who schedules these takes care of. With her currently being out on vacation I took care of this to ensure the patient was not delayed In scheduling. Larene Beach never did call me back so I was unable to proceed any further with the scheduling as she did not call me back and give me a contact number. I called Pamala Hurry in specials and she is currently helping me and will call me back tomorrow morning with a contact person to get this patient scheduled for his Bronch.

## 2021-03-29 NOTE — Telephone Encounter (Signed)
Spoke to pt and informed him per DFK to stop his bp meds due to his low bp readings. Mon: 75/57 am          81/49 pm         71/52 Tues:  85/50 am  87/59 pm WED: 81/59 am Pt understood to stop all bp meds.  Per DSK pt needs to have a pre admit covid  test done before his procedure, I called the preadmit desk and they want pt to go on 03/31/21 between 8 am to 12 pm [facility]  I informed pt of where,date and time to do testing.

## 2021-03-29 NOTE — Telephone Encounter (Signed)
Lawrence Steele with [facility] returned my call. Patient has been scheduled for Bronchoscopy on 04/03/21 at 12:30. Patient has been advised.

## 2021-03-31 ENCOUNTER — Other Ambulatory Visit: Payer: Self-pay

## 2021-03-31 ENCOUNTER — Other Ambulatory Visit
Admission: RE | Admit: 2021-03-31 | Discharge: 2021-03-31 | Disposition: A | Discharge status: Home / Self Care | Payer: Medicare HMO | Source: Ambulatory Visit | Attending: Internal Medicine | Admitting: Internal Medicine

## 2021-03-31 DIAGNOSIS — Z20822 Contact with and (suspected) exposure to covid-19: Secondary | ICD-10-CM | POA: Insufficient documentation

## 2021-03-31 DIAGNOSIS — Z01812 Encounter for preprocedural laboratory examination: Secondary | ICD-10-CM | POA: Insufficient documentation

## 2021-03-31 LAB — SARS CORONAVIRUS 2 (TAT 6-24 HRS): SARS Coronavirus 2: NEGATIVE

## 2021-04-02 ENCOUNTER — Other Ambulatory Visit: Payer: Self-pay | Admitting: Nurse Practitioner

## 2021-04-02 ENCOUNTER — Other Ambulatory Visit: Payer: Self-pay | Admitting: Internal Medicine

## 2021-04-02 DIAGNOSIS — F411 Generalized anxiety disorder: Secondary | ICD-10-CM

## 2021-04-03 ENCOUNTER — Ambulatory Visit: Payer: Medicare HMO | Admitting: Urgent Care

## 2021-04-03 ENCOUNTER — Encounter
Admission: RE | Disposition: A | Discharge status: Home / Self Care | Payer: Self-pay | Source: Home / Self Care | Attending: Internal Medicine

## 2021-04-03 ENCOUNTER — Ambulatory Visit
Admission: RE | Admit: 2021-04-03 | Discharge: 2021-04-03 | Disposition: A | Discharge status: Home / Self Care | Payer: Medicare HMO | Attending: Internal Medicine | Admitting: Internal Medicine

## 2021-04-03 ENCOUNTER — Encounter: Payer: Self-pay | Admitting: Internal Medicine

## 2021-04-03 ENCOUNTER — Ambulatory Visit: Payer: Medicare HMO

## 2021-04-03 ENCOUNTER — Other Ambulatory Visit: Payer: Self-pay

## 2021-04-03 DIAGNOSIS — Z7951 Long term (current) use of inhaled steroids: Secondary | ICD-10-CM | POA: Diagnosis not present

## 2021-04-03 DIAGNOSIS — R042 Hemoptysis: Secondary | ICD-10-CM | POA: Insufficient documentation

## 2021-04-03 DIAGNOSIS — I9589 Other hypotension: Secondary | ICD-10-CM | POA: Diagnosis not present

## 2021-04-03 DIAGNOSIS — Z85820 Personal history of malignant melanoma of skin: Secondary | ICD-10-CM | POA: Diagnosis not present

## 2021-04-03 DIAGNOSIS — Z7984 Long term (current) use of oral hypoglycemic drugs: Secondary | ICD-10-CM | POA: Insufficient documentation

## 2021-04-03 DIAGNOSIS — Z791 Long term (current) use of non-steroidal anti-inflammatories (NSAID): Secondary | ICD-10-CM | POA: Diagnosis not present

## 2021-04-03 DIAGNOSIS — K7689 Other specified diseases of liver: Secondary | ICD-10-CM | POA: Insufficient documentation

## 2021-04-03 DIAGNOSIS — R0602 Shortness of breath: Secondary | ICD-10-CM | POA: Insufficient documentation

## 2021-04-03 DIAGNOSIS — Z9981 Dependence on supplemental oxygen: Secondary | ICD-10-CM | POA: Insufficient documentation

## 2021-04-03 DIAGNOSIS — Z955 Presence of coronary angioplasty implant and graft: Secondary | ICD-10-CM | POA: Insufficient documentation

## 2021-04-03 DIAGNOSIS — E278 Other specified disorders of adrenal gland: Secondary | ICD-10-CM | POA: Diagnosis not present

## 2021-04-03 DIAGNOSIS — R918 Other nonspecific abnormal finding of lung field: Secondary | ICD-10-CM

## 2021-04-03 DIAGNOSIS — F17210 Nicotine dependence, cigarettes, uncomplicated: Secondary | ICD-10-CM | POA: Insufficient documentation

## 2021-04-03 DIAGNOSIS — Z79899 Other long term (current) drug therapy: Secondary | ICD-10-CM | POA: Diagnosis not present

## 2021-04-03 DIAGNOSIS — I700 Atherosclerosis of aorta: Secondary | ICD-10-CM | POA: Insufficient documentation

## 2021-04-03 DIAGNOSIS — J40 Bronchitis, not specified as acute or chronic: Secondary | ICD-10-CM | POA: Diagnosis not present

## 2021-04-03 DIAGNOSIS — R911 Solitary pulmonary nodule: Secondary | ICD-10-CM | POA: Insufficient documentation

## 2021-04-03 DIAGNOSIS — Z7901 Long term (current) use of anticoagulants: Secondary | ICD-10-CM | POA: Insufficient documentation

## 2021-04-03 HISTORY — PX: VIDEO BRONCHOSCOPY: SHX5072

## 2021-04-03 LAB — GLUCOSE, CAPILLARY: Glucose-Capillary: 130 mg/dL — ABNORMAL HIGH (ref 70–99)

## 2021-04-03 SURGERY — VIDEO BRONCHOSCOPY WITHOUT FLUORO
Anesthesia: General

## 2021-04-03 MED ORDER — FENTANYL CITRATE (PF) 100 MCG/2ML IJ SOLN
INTRAMUSCULAR | Status: AC
  Filled 2021-04-03: qty 8

## 2021-04-03 MED ORDER — PROPOFOL 500 MG/50ML IV EMUL
INTRAVENOUS | Status: AC
  Filled 2021-04-03: qty 50

## 2021-04-03 MED ORDER — ROCURONIUM BROMIDE 10 MG/ML (PF) SYRINGE
PREFILLED_SYRINGE | INTRAVENOUS | Status: AC
  Filled 2021-04-03: qty 10

## 2021-04-03 MED ORDER — LIDOCAINE HCL (PF) 2 % IJ SOLN
INTRAMUSCULAR | Status: AC
  Filled 2021-04-03: qty 5

## 2021-04-03 MED ORDER — LIDOCAINE HCL URETHRAL/MUCOSAL 2 % EX GEL
1.0000 "application " | Freq: Once | CUTANEOUS | Status: DC
  Filled 2021-04-03: qty 5

## 2021-04-03 MED ORDER — MIDAZOLAM HCL 2 MG/2ML IJ SOLN
INTRAMUSCULAR | Status: DC | PRN
  Administered 2021-04-03: 1 mg via INTRAVENOUS
  Administered 2021-04-03: 2 mg via INTRAVENOUS

## 2021-04-03 MED ORDER — CHLORHEXIDINE GLUCONATE 0.12 % MT SOLN: 15.0000 mL | Freq: Once | OROMUCOSAL | Status: DC

## 2021-04-03 MED ORDER — FENTANYL CITRATE (PF) 100 MCG/2ML IJ SOLN
INTRAMUSCULAR | Status: AC
  Filled 2021-04-03: qty 2

## 2021-04-03 MED ORDER — MIDAZOLAM HCL 2 MG/2ML IJ SOLN
INTRAMUSCULAR | Status: AC
  Filled 2021-04-03: qty 10

## 2021-04-03 MED ORDER — ONDANSETRON HCL 4 MG/2ML IJ SOLN
INTRAMUSCULAR | Status: AC
  Filled 2021-04-03: qty 2

## 2021-04-03 MED ORDER — FENTANYL CITRATE (PF) 100 MCG/2ML IJ SOLN
INTRAMUSCULAR | Status: DC | PRN
  Administered 2021-04-03 (×3): 50 ug via INTRAVENOUS

## 2021-04-03 MED ORDER — ORAL CARE MOUTH RINSE: 15.0000 mL | Freq: Once | OROMUCOSAL | Status: DC

## 2021-04-03 MED ORDER — DEXAMETHASONE SODIUM PHOSPHATE 10 MG/ML IJ SOLN
INTRAMUSCULAR | Status: AC
  Filled 2021-04-03: qty 1

## 2021-04-03 MED ORDER — SODIUM CHLORIDE 0.9 % IV SOLN: INTRAVENOUS | Status: DC

## 2021-04-03 NOTE — Discharge Instructions (Signed)
AMBULATORY SURGERY  ?DISCHARGE INSTRUCTIONS ? ? ?The drugs that you were given will stay in your system until tomorrow so for the next 24 hours you should not: ? ?Drive an automobile ?Make any legal decisions ?Drink any alcoholic beverage ? ? ?You may resume regular meals tomorrow.  Today it is better to start with liquids and gradually work up to solid foods. ? ?You may eat anything you prefer, but it is better to start with liquids, then soup and crackers, and gradually work up to solid foods. ? ? ?Please notify your doctor immediately if you have any unusual bleeding, trouble breathing, redness and pain at the surgery site, drainage, fever, or pain not relieved by medication. ? ? ? ?Additional Instructions: ? ? ? ?Please contact your physician with any problems or Same Day Surgery at [phone], Monday through Friday 6 am to 4 pm, or [place] [facility] Main number at [phone].  ?

## 2021-04-03 NOTE — Anesthesia Preprocedure Evaluation (Addendum)
Anesthesia Evaluation  Patient identified by MRN, date of birth, ID band Patient awake    Reviewed: Allergy & Precautions, H&P , NPO status , Patient's Chart, lab work & pertinent test results  History of Anesthesia Complications Negative for: history of anesthetic complications  Airway Mallampati: III  TM Distance: <3 FB Neck ROM: limited    Dental  (+) Chipped, Poor Dentition, Missing   Pulmonary shortness of breath, with exertion and at rest, asthma , sleep apnea, Continuous Positive Airway Pressure Ventilation and Oxygen sleep apnea , COPD,  COPD inhaler and oxygen dependent, Current SmokerPatient did not abstain from smoking.,  Home Oxygen 2L  Hemoptysis, SOB, Coughing, DOE, Pulmonary nodule   + rhonchi  + wheezing      Cardiovascular Exercise Tolerance: Good hypertension, (-) angina+ CAD, + Peripheral Vascular Disease, +CHF and + DOE  (-) Past MI + dysrhythmias Atrial Fibrillation III Rhythm:Irregular Rate:Tachycardia + Peripheral Edema- Systolic murmurs and - Diastolic murmurs S/p aortobiiliac endograft  Palpable radial pulse   Neuro/Psych  Headaches, PSYCHIATRIC DISORDERS Anxiety  Neuromuscular disease negative neurological ROS     GI/Hepatic negative GI ROS, Neg liver ROS, neg GERD  ,  Endo/Other  negative endocrine ROSdiabetes  Renal/GU negative Renal ROS  negative genitourinary   Musculoskeletal  (+) Arthritis ,   Abdominal (+) + obese,   Peds  Hematology negative hematology ROS (+)   Anesthesia Other Findings Past Medical History: No date: AAA (abdominal aortic aneurysm) (HCC)     Comment:  a.  Status post endovascular repair at [facility] in [facility] No date: Anxiety No date: Asthma No date: Atrial flutter (HCC)     Comment:  a. diagnosed 7/19; b. CHADS2VASc => 5 (CHF, HTN, age x               1, DM, vascular disease); c. on Eliquis; d. s/p TEE/DCCV               8/19 with repeat DCCV 12/19 No date: Chronic  diastolic CHF (congestive heart failure) (HCC) No date: COPD (chronic obstructive pulmonary disease) ([place]) No date: Coronary artery disease     Comment:  a. LHC 5/10: left main 10%, mLAD-1 lesion 20%, mLAD-2               lesion 20%, D1 20%, mLCx-1 lesion 30%, mLCx-2 lesion 50%,              OM1 20%, OM to 20%, LPL 20%, pRCA-1 lesion 20%, pRCA-2               lesion 20%.  EF 51% w/ mild inf HK No date: Diabetes mellitus with complication (HCC) No date: Hyperlipidemia No date: Hypertension No date: Iliac artery aneurysm, bilateral (HCC) No date: Obstructive sleep apnea-hypopnea syndrome  Past Surgical History: 03/06/2015: ABDOMINAL AORTIC ANEURYSM REPAIR     Comment:  [facility]  No date: ADENOIDECTOMY No date: APPENDECTOMY 2010,03/16/2003,11/04/2002: CARDIAC CATHETERIZATION 04/24/2018: CARDIOVERSION; N/A     Comment:  Procedure: CARDIOVERSION;  Surgeon: Nelva Bush,               MD;  Location: [facility];  Service: Cardiovascular;                Laterality: N/A; 09/05/2018: CARDIOVERSION; N/A     Comment:  Procedure: CARDIOVERSION;  Surgeon: Minna Merritts,               MD;  Location: [facility];  Service: Cardiovascular;  Laterality: N/A; 11/24/2018: CARDIOVERSION; N/A     Comment:  Procedure: CARDIOVERSION (CATH LAB);  Surgeon: Wellington Hampshire, MD;  Location: [facility];  Service:               Cardiovascular;  Laterality: N/A; 03/16/2003: CORONARY ANGIOPLASTY     Comment:  stent to the RCA  & 2/18/2004stent mid circumflex No date: ELBOW SURGERY No date: HERNIA REPAIR No date: NASAL SINUS SURGERY 1995: PENILE PROSTHESIS IMPLANT No date: PILONIDAL CYST EXCISION 04/24/2018: TEE WITHOUT CARDIOVERSION; N/A     Comment:  Procedure: TRANSESOPHAGEAL ECHOCARDIOGRAM (TEE);                Surgeon: Nelva Bush, MD;  Location: [facility];                Service: Cardiovascular;  Laterality: N/A; No date: TONSILECTOMY, ADENOIDECTOMY, BILATERAL MYRINGOTOMY  AND TUBES No date: UMBILICAL HERNIA REPAIR  BMI    Body Mass Index: 39.43 kg/m      Reproductive/Obstetrics negative OB ROS                            Anesthesia Physical  Anesthesia Plan  ASA: 4  Anesthesia Plan: General   Post-op Pain Management:    Induction: Intravenous  PONV Risk Score and Plan: 2 and TIVA and Propofol infusion  Airway Management Planned: Oral ETT  Additional Equipment: Arterial line  Intra-op Plan:   Post-operative Plan: Extubation in OR  Informed Consent: I have reviewed the patients History and Physical, chart, labs and discussed the procedure including the risks, benefits and alternatives for the proposed anesthesia with the patient or authorized representative who has indicated his/her understanding and acceptance.     Dental Advisory Given  Plan Discussed with: Anesthesiologist, CRNA and Surgeon  Anesthesia Plan Comments: (Patient consented for risks of anesthesia including but not limited to:  - adverse reactions to medications - risk of intubation if required - damage to teeth, lips or other oral mucosa - sore throat or hoarseness - Damage to heart, brain, lungs or loss of life  Patient voiced understanding.)       Anesthesia Quick Evaluation

## 2021-04-03 NOTE — Procedures (Signed)
Date: 04/03/2021,  MRN# [id number]    Procedure Note: Fiberoptic Bronchoscopy   PROCEDURE DATE: 04/03/2021     NAME:  Lawrence Steele   DOB:1951-03-21   MRN: [id number] LOC:  [facility]/None      Indications/Preliminary Diagnosis: Lung mass  Consent: (Place X beside choice/s below)  The benefits, risks and possible complications of the procedure were        explained to:  __X_ patient  ___ patient's family  ___ other:___________  who verbalized understanding and gave:  ___ verbal  ___ written  ___ verbal and written  ___ telephone  ___ other:________ consent.      Unable to obtain consent; procedure performed on emergent basis.     Other:      PRESEDATION ASSESSMENT: History and Physical has been performed. Patient meds and allergies have been reviewed. Presedation airway examination has been performed and documented. Baseline vital signs, sedation score, oxygenation status, and cardiac rhythm were reviewed. Patient was deemed to be in satisfactory condition to undergo the procedure.  PREMEDICATIONS:   Sedative/Narcotic Amt Dose   Versed 3 mg   Fentanyl 150 mcg  Diprivan  mg         Insertion Route (Place X beside choice below)  X Nasal   Oral   Endotracheal Tube   Tracheostomy   INTRAPROCEDURE MEDICATIONS:  Sedative/Narcotic Amt Dose   Versed  mg   Fentanyl  mcg  Diprivan  mg       Medication Amt Dose  Medication Amt Dose  Xylocaine 2%  cc  Epinephrine 1:10,000 sol  cc  Xylocaine 4%  cc  Cocaine  cc   TECHNICAL PROCEDURES: (Place X beside choice below)   Procedures  Description  X  None     Electrocautery     Cryotherapy     Balloon Dilatation     Bronchography     Stent Placement     Therapeutic Aspiration     Laser/Argon Plasma            SPECIMENS (Sites): (Place X beside choice below)  Specimens Description   No Specimens Obtained     Washings   X Lavage    Biopsies    Fine Needle Aspirates   X Brushings    Sputum    FINDINGS:  ESTIMATED  BLOOD LOSS: none  COMPLICATIONS/RESOLUTION: none  PROCEDURE DETAILS: Timeout performed and correct patient, name, & ID confirmed. Following prep per Pulmonary policy, appropriate sedation was administered.  Airway exam proceeded with findings, technical procedures, and specimen collection as noted below. At the end of exam the scope was withdrawn without incident. Impression and Plan as noted below.    Procedure Note: After patient was prepared above patient was given Versed fentanyl and the doses noted above to achieve adequate sedation.  The right nostril was also anesthetized with lidocaine.  Fiberoptic scope was inserted down through the right nostril without difficulty.  The vocal cords were evaluated they were mobile.  Next the scope was traversed through the vocal cords down into the trachea.  The carina was evaluated found to be nice and sharp.  Next fiberoptic scope was inserted into the left mainstem and then into the right mainstem.  An entry of the right mainstem at the division of the right middle and right lower lobe around the bronchus intermedius area there was some extrinsic compression and narrowing.  The underlying mucosa looked abnormal and was hyperemic and the opening to the lower lobe looked narrowed.  There was no discrete masses that could be biopsied however we were able to brush the mucosa.  There was minimal bleeding noted on brushing.  In addition we performed lavage of this area also.  The specimens collected were given to pathology on initial review there was some atypia noted.  Will await further evaluation after pathologist reviews the slides    IMPRESSION:POST-PROCEDURE DX: Lung mass   RECOMMENDATION/PLAN: Await pathology report cytology and also cultures which have been ordered.  I have personally performed the procedure as noted above     [name], MD [facility] Critical [facility] Medicine

## 2021-04-04 ENCOUNTER — Encounter: Payer: Self-pay | Admitting: Internal Medicine

## 2021-04-04 LAB — CYTOLOGY - NON PAP

## 2021-04-04 LAB — GRAM STAIN

## 2021-04-05 LAB — ACID FAST SMEAR (AFB, MYCOBACTERIA): Acid Fast Smear: NEGATIVE

## 2021-04-10 DIAGNOSIS — J449 Chronic obstructive pulmonary disease, unspecified: Secondary | ICD-10-CM | POA: Diagnosis not present

## 2021-04-13 ENCOUNTER — Other Ambulatory Visit: Payer: Self-pay

## 2021-04-13 ENCOUNTER — Ambulatory Visit: Payer: Medicare HMO | Admitting: Internal Medicine

## 2021-04-13 ENCOUNTER — Encounter: Payer: Self-pay | Admitting: Internal Medicine

## 2021-04-13 VITALS — BP 140/80 | HR 76 | Temp 97.8°F | Resp 16 | Ht 70.0 in | Wt 219.0 lb

## 2021-04-13 DIAGNOSIS — J449 Chronic obstructive pulmonary disease, unspecified: Secondary | ICD-10-CM

## 2021-04-13 DIAGNOSIS — G4733 Obstructive sleep apnea (adult) (pediatric): Secondary | ICD-10-CM

## 2021-04-13 DIAGNOSIS — R911 Solitary pulmonary nodule: Secondary | ICD-10-CM

## 2021-04-13 DIAGNOSIS — I482 Chronic atrial fibrillation, unspecified: Secondary | ICD-10-CM

## 2021-04-13 DIAGNOSIS — R0602 Shortness of breath: Secondary | ICD-10-CM

## 2021-04-13 DIAGNOSIS — R918 Other nonspecific abnormal finding of lung field: Secondary | ICD-10-CM | POA: Diagnosis not present

## 2021-04-13 DIAGNOSIS — Z9989 Dependence on other enabling machines and devices: Secondary | ICD-10-CM | POA: Diagnosis not present

## 2021-04-13 NOTE — Progress Notes (Signed)
[facility] [place], [place] [address]  Pulmonary Sleep Medicine   Office Visit Note  Patient Name: Lawrence Steele DOB: [birth year] MRN [id number]  Date of Service: 04/13/2021  Complaints/HPI: Lung Mass patient is here for follow-up after bronchoscopy.  We did the bronchoscopy and at the time the brushings were done and there was no finding of malignant cells in the pressures.  He does still have a significant mass as already previously noted.  I think we will continue to proceed to the neck step which would be CT-guided needle biopsy.  We will try to get this arranged at the hospital through radiology.  As previously noted he continues to smoke he has had longstanding history of smoking and the risk of malignancy is high and if that is the case this is obviously already advanced malignancy.  ROS  General: (-) fever, (-) chills, (-) night sweats, (-) weakness Skin: (-) rashes, (-) itching,. Eyes: (-) visual changes, (-) redness, (-) itching. Nose and Sinuses: (-) nasal stuffiness or itchiness, (-) postnasal drip, (-) nosebleeds, (-) sinus trouble. Mouth and Throat: (-) sore throat, (-) hoarseness. Neck: (-) swollen glands, (-) enlarged thyroid, (-) neck pain. Respiratory: + cough, (-) bloody sputum, + shortness of breath, + wheezing. Cardiovascular: - ankle swelling, (-) chest pain. Lymphatic: (-) lymph node enlargement. Neurologic: (-) numbness, (-) tingling. Psychiatric: (-) anxiety, (-) depression   Current Medication: Outpatient Encounter Medications as of 04/13/2021  Medication Sig Note   Accu-Chek Softclix Lancets lancets Use as instructed  Once a daily E11.65    acetaminophen (TYLENOL) 500 MG tablet Take 1,000-1,500 mg by mouth every 8 (eight) hours as needed for headache.    albuterol (VENTOLIN HFA) 108 (90 Base) MCG/ACT inhaler Inhale 2 puffs into the lungs every 6 (six) hours as needed for wheezing or shortness of breath.    allopurinol (ZYLOPRIM)  100 MG tablet Take 1 tablet (100 mg total) by mouth 2 (two) times daily.    apixaban (ELIQUIS) 5 MG TABS tablet Take 1 tablet (5 mg total) by mouth 2 (two) times daily. 03/30/2021: On hold for procedure    atorvastatin (LIPITOR) 20 MG tablet Take 1 tablet by mouth once daily (Patient taking differently: Take 20 mg by mouth daily.)    Blood Glucose Monitoring Suppl (ACCU-CHEK AVIVA PLUS) w/Device KIT Use as directed    calcium carbonate (TUMS EX) 750 MG chewable tablet Chew 2,250-3,000 mg by mouth daily as needed for heartburn.    cyclobenzaprine (FLEXERIL) 10 MG tablet TAKE 1 TABLET BY MOUTH TWICE DAILY, ONE AT 10AM AND ONE AT 5PM    diclofenac sodium (VOLTAREN) 1 % GEL Apply 1 application topically 4 (four) times daily as needed (pain).     docusate sodium (COLACE) 250 MG capsule Take 250 mg by mouth daily.    doxycycline (VIBRA-TABS) 100 MG tablet Take 1 tablet (100 mg total) by mouth daily. 03/30/2021: scheduled   escitalopram (LEXAPRO) 5 MG tablet Take 1 tablet (5 mg total) by mouth daily.    Fe Cbn-Fe Gluc-FA-B12-C-DSS (FERRALET 90) 90-1 MG TABS Take 1 mg by mouth daily.    ferrous sulfate 325 (65 FE) MG tablet Take 325 mg by mouth daily.    Fluticasone-Salmeterol (ADVAIR) 250-50 MCG/DOSE AEPB Inhale 1 puff into the lungs 2 (two) times daily.    furosemide (LASIX) 20 MG tablet TAKE 2 TABLETS BY MOUTH EVERY OTHER DAY ,ALTERNATE  WITH  1  TABLET (Patient taking differently: Take 20-40 mg by mouth  See admin instructions. Take 20 mg daily, may increase to 40 mg daily when experiencing swelling)    gabapentin (NEURONTIN) 400 MG capsule Decrease to TID for several days. If tolerated well, may decrease to twice daily. (Patient taking differently: Take 400 mg by mouth 4 (four) times daily.)    glucose blood (ACCU-CHEK AVIVA PLUS) test strip 1 each by Other route as needed for other. Use as instructed    hydroxypropyl methylcellulose / hypromellose (ISOPTO TEARS / GONIOVISC) 2.5 % ophthalmic solution  Place 1 drop into both eyes 4 (four) times daily as needed (eye irritation (sand like feeling)).     ipratropium (ATROVENT) 0.02 % nebulizer solution Take 2.5 mLs (0.5 mg total) by nebulization every 6 (six) hours as needed for wheezing or shortness of breath. Use every 6 hours for Shortness of breath and wheezing and as needed. J44.9, J96.11  Prescription is for 90 days (Patient taking differently: Take 0.5 mg by nebulization every 6 (six) hours as needed for wheezing or shortness of breath. Use every 6 hours for Shortness of breath and wheezing and as needed. J44.9, J96.11  Prescription is for 90 days)    ipratropium-albuterol (DUONEB) 0.5-2.5 (3) MG/3ML SOLN Inhale 3 mLs into the lungs every 6 (six) hours as needed. (Patient taking differently: Inhale 3 mLs into the lungs every 6 (six) hours as needed (shortness of breath).)    levofloxacin (LEVAQUIN) 500 MG tablet Take 1 tablet (500 mg total) by mouth daily.    loratadine (CLARITIN) 10 MG tablet Take 10 mg by mouth daily.    meloxicam (MOBIC) 7.5 MG tablet Take 1 tablet (7.5 mg total) by mouth 2 (two) times daily.    metFORMIN (GLUCOPHAGE) 500 MG tablet Take 1 tablet (500 mg total) by mouth 2 (two) times daily with a meal. (Patient taking differently: Take 500 mg by mouth daily.)    nitroGLYCERIN (NITROSTAT) 0.4 MG SL tablet Place 0.4 mg under the tongue every 5 (five) minutes x 3 doses as needed for chest pain.     nystatin (MYCOSTATIN) 100000 UNIT/ML suspension Take 5 mLs (500,000 Units total) by mouth 4 (four) times daily as needed (mouth/throat irritation.).    oxyCODONE-acetaminophen (PERCOCET) 10-325 MG tablet Take 1 tablet by mouth every 4 (four) hours as needed for pain.    OXYGEN Inhale 2 L into the lungs continuous.     Roflumilast (DALIRESP) 250 MCG TABS Take 250 mcg by mouth daily.    sodium chloride (OCEAN) 0.65 % SOLN nasal spray Place 2-3 sprays into both nostrils daily.    traZODone (DESYREL) 100 MG tablet Take 1 tablet (100 mg  total) by mouth at bedtime.    umeclidinium bromide (INCRUSE ELLIPTA) 62.5 MCG/INH AEPB Inhale 1 puff into the lungs daily.    [DISCONTINUED] predniSONE (DELTASONE) 10 MG tablet Take one tab 3 x day for 3 days, then take one tab 2 x a day for 3 days and then take one tab a day for 3 days for copd    atenolol (TENORMIN) 25 MG tablet Take 1 tablet (25 mg total) by mouth daily. (Patient not taking: Reported on 04/13/2021) 03/30/2021: On hold for procedure    No facility-administered encounter medications on file as of 04/13/2021.    Surgical History: Past Surgical History:  Procedure Laterality Date   ABDOMINAL AORTIC ANEURYSM REPAIR  03/06/2015   [facility]    ADENOIDECTOMY     AMPUTATION Left    partial amputation   APPENDECTOMY     CARDIAC  CATHETERIZATION  2010,03/16/2003,11/04/2002   CARDIOVERSION N/A 04/24/2018   Procedure: CARDIOVERSION;  Surgeon: Nelva Bush, MD;  Location: [facility] ORS;  Service: Cardiovascular;  Laterality: N/A;   CARDIOVERSION N/A 09/05/2018   Procedure: CARDIOVERSION;  Surgeon: Minna Merritts, MD;  Location: [facility] ORS;  Service: Cardiovascular;  Laterality: N/A;   CARDIOVERSION N/A 11/24/2018   Procedure: CARDIOVERSION (CATH LAB);  Surgeon: Wellington Hampshire, MD;  Location: [facility] ORS;  Service: Cardiovascular;  Laterality: N/A;   CARDIOVERSION N/A 03/18/2019   Procedure: CARDIOVERSION;  Surgeon: Minna Merritts, MD;  Location: [facility];  Service: Cardiovascular;  Laterality: N/A;   CARDIOVERSION N/A 11/13/2019   Procedure: CARDIOVERSION;  Surgeon: Minna Merritts, MD;  Location: [facility] ORS;  Service: Cardiovascular;  Laterality: N/A;   CORONARY ANGIOPLASTY  03/16/2003   stent to the RCA  & 2/18/2004stent mid circumflex   ELBOW SURGERY     HERNIA REPAIR     NASAL [place]     TEE WITHOUT CARDIOVERSION N/A 04/24/2018   Procedure: TRANSESOPHAGEAL ECHOCARDIOGRAM (TEE);  Surgeon: Nelva Bush, MD;   Location: [facility] ORS;  Service: Cardiovascular;  Laterality: N/A;   TONSILECTOMY, ADENOIDECTOMY, BILATERAL MYRINGOTOMY AND TUBES     UMBILICAL HERNIA REPAIR     VIDEO BRONCHOSCOPY N/A 04/03/2021   Procedure: VIDEO BRONCHOSCOPY WITHOUT FLUORO;  Surgeon: [name], MD;  Location: [facility] ORS;  Service: Pulmonary;  Laterality: N/A;    Medical History: Past Medical History:  Diagnosis Date   AAA (abdominal aortic aneurysm) ([place])    a.  Status post endovascular repair at [facility] in 2016   Anxiety    Asthma    Atrial flutter ([place])    a. diagnosed 7/19; b. CHADS2VASc => 5 (CHF, HTN, age x 1, DM, vascular disease); c. on Eliquis; d. s/p TEE/DCCV 8/19 with repeat DCCV 12/19   Cancer ([place])    melanoma L index finger    Chronic diastolic CHF (congestive heart failure) (HCC)    COPD (chronic obstructive pulmonary disease) ([place])    Coronary artery disease    a. LHC 5/10: left main 10%, mLAD-1 lesion 20%, mLAD-2 lesion 20%, D1 20%, mLCx-1 lesion 30%, mLCx-2 lesion 50%, OM1 20%, OM to 20%, LPL 20%, pRCA-1 lesion 20%, pRCA-2 lesion 20%.  EF 51% w/ mild inf HK   Diabetes mellitus with complication (HCC)    Hyperlipidemia    Hypertension    Iliac artery aneurysm, bilateral (HCC)    Obstructive sleep apnea-hypopnea syndrome     Family History: Family History  Problem Relation Age of Onset   Hypertension Mother    Hyperlipidemia Mother    Depression Mother    Heart attack Father 71   Heart disease Father    Hypertension Father    Hyperlipidemia Father     Social History: Social History   Socioeconomic History   Marital status: Divorced    Spouse name: Not on file   Number of children: 2   Years of education: 10    Highest education level: 10th grade  Occupational History   Occupation: disabled   Tobacco Use   Smoking status: Every Day    Packs/day: 0.25    Years: 45.00    Pack years: 11.25    Types: Cigarettes   Smokeless tobacco: Former    Types: Chew   Tobacco comments:    3 to  4 cigarettes a day  Vaping Use   Vaping Use:  Never used  Substance and Sexual Activity   Alcohol use: Not Currently   Drug use: No   Sexual activity: Not on file  Other Topics Concern   Not on file  Social History Narrative   Not on file   Social Determinants of Health   Financial Resource Strain: Not on file  Food Insecurity: No Food Insecurity   Worried About Running Out of Food in the Last Year: Never true   Ran Out of Food in the Last Year: Never true  Transportation Needs: No Transportation Needs   Lack of Transportation (Medical): No   Lack of Transportation (Non-Medical): No  Physical Activity: Not on file  Stress: Not on file  Social Connections: Not on file  Intimate Partner Violence: Not on file    Vital Signs: Blood pressure 140/80, pulse 76, temperature 97.8 F (36.6 C), resp. rate 16, height _0  (1.778 m), weight 219 lb (99.3 kg), SpO2 94 %.  Examination: General Appearance: The patient is well-developed, well-nourished, and in no distress. Skin: Gross inspection of skin unremarkable. Head: normocephalic, no gross deformities. Eyes: no gross deformities noted. ENT: ears appear grossly normal no exudates. Neck: Supple. No thyromegaly. No LAD. Respiratory: no rhonchi noted at this time. Cardiovascular: Normal S1 and S2 without murmur or rub. Extremities: No cyanosis. pulses are equal. Neurologic: Alert and oriented. No involuntary movements.  LABS: Recent Results (from the past 2160 hour(s))  POCT HgB A1C     Status: Abnormal   Collection Time: 02/16/21  8:39 AM  Result Value Ref Range   Hemoglobin A1C 6.0 (A) 4.0 - 5.6 %   HbA1c POC (<> result, manual entry)     HbA1c, POC (prediabetic range)     HbA1c, POC (controlled diabetic range)    CBC with Differential/Platelet     Status: Abnormal   Collection Time: 02/20/21 11:47 AM  Result Value Ref Range   WBC 9.4 3.4 - 10.8 x10E3/uL   RBC 4.13 (L) 4.14 - 5.80 x10E6/uL   Hemoglobin 13.2 13.0 - 17.7  g/dL   Hematocrit 38.9 37.5 - 51.0 %   MCV 94 79 - 97 fL   MCH 32.0 26.6 - 33.0 pg   MCHC 33.9 31.5 - 35.7 g/dL   RDW 15.0 11.6 - 15.4 %   Platelets 160 150 - 450 x10E3/uL   Neutrophils 66 Not Estab. %   Lymphs 21 Not Estab. %   Monocytes 9 Not Estab. %   Eos 2 Not Estab. %   Basos 1 Not Estab. %   Neutrophils Absolute 6.3 1.4 - 7.0 x10E3/uL   Lymphocytes Absolute 2.0 0.7 - 3.1 x10E3/uL   Monocytes Absolute 0.8 0.1 - 0.9 x10E3/uL   EOS (ABSOLUTE) 0.2 0.0 - 0.4 x10E3/uL   Basophils Absolute 0.1 0.0 - 0.2 x10E3/uL   Immature Granulocytes 1 Not Estab. %   Immature Grans (Abs) 0.1 0.0 - 0.1 x10E3/uL  D-Dimer, Quantitative     Status: Abnormal   Collection Time: 02/20/21 11:47 AM  Result Value Ref Range   D-DIMER 0.89 (H) 0.00 - 0.49 mg/L FEU    Comment: According to the assay manufacturer's published package insert, a normal (<0.50 mg/L FEU) D-dimer result in conjunction with a non-high clinical probability assessment, excludes deep vein thrombosis (DVT) and pulmonary embolism (PE) with high sensitivity. D-dimer values increase with age and this can make VTE exclusion of an older population difficult. To address this, the [employer], based on best available evidence and recent guidelines,  recommends that clinicians use age-adjusted D-dimer thresholds in patients greater than 42 years of age with: a) a low probability of PE who do not meet all Pulmonary Embolism Rule Out Criteria, or b) in those with intermediate probability of PE. The formula for an age-adjusted D-dimer cut-off is "age/100". For example, a 70 year old patient would have an age-adjusted cut-off of 0.60 mg/L FEU and an 70 year old 0.80 mg/L FEU.   CMP14+EGFR     Status: Abnormal   Collection Time: 02/20/21 11:47 AM  Result Value Ref Range   Glucose 146 (H) 65 - 99 mg/dL   BUN 10 8 - 27 mg/dL   Creatinine, Ser 0.77 0.76 - 1.27 mg/dL   eGFR 97 >59 mL/min/1.73   BUN/Creatinine Ratio 13 10 - 24    Sodium 135 134 - 144 mmol/L   Potassium 3.9 3.5 - 5.2 mmol/L   Chloride 90 (L) 96 - 106 mmol/L   CO2 28 20 - 29 mmol/L   Calcium 8.5 (L) 8.6 - 10.2 mg/dL   Total Protein 8.0 6.0 - 8.5 g/dL   Albumin 3.3 (L) 3.8 - 4.8 g/dL   Globulin, Total 4.7 (H) 1.5 - 4.5 g/dL   Albumin/Globulin Ratio 0.7 (L) 1.2 - 2.2   Bilirubin Total 0.9 0.0 - 1.2 mg/dL   Alkaline Phosphatase 106 44 - 121 IU/L   AST 12 0 - 40 IU/L   ALT 6 0 - 44 IU/L  Glucose, capillary     Status: Abnormal   Collection Time: 03/09/21  8:37 AM  Result Value Ref Range   Glucose-Capillary 122 (H) 70 - 99 mg/dL    Comment: Glucose reference range applies only to samples taken after fasting for at least 8 hours.  SARS CORONAVIRUS 2 (TAT 6-24 HRS) Nasopharyngeal Nasopharyngeal Swab     Status: None   Collection Time: 03/31/21  9:37 AM   Specimen: Nasopharyngeal Swab  Result Value Ref Range   SARS Coronavirus 2 NEGATIVE NEGATIVE    Comment: (NOTE) SARS-CoV-2 target nucleic acids are NOT DETECTED.  The SARS-CoV-2 RNA is generally detectable in upper and lower respiratory specimens during the acute phase of infection. Negative results do not preclude SARS-CoV-2 infection, do not rule out co-infections with other pathogens, and should not be used as the sole basis for treatment or other patient management decisions. Negative results must be combined with clinical observations, patient history, and epidemiological information. The expected result is Negative.  Fact Sheet for Patients: SugarRoll.be  Fact Sheet for Healthcare Providers: https://www.woods-mathews.com/  This test is not yet approved or cleared by the [place] FDA and  has been authorized for detection and/or diagnosis of SARS-CoV-2 by FDA under an Emergency Use Authorization (EUA). This EUA will remain  in effect (meaning this test can be used) for the duration of the COVID-19 declaration under Se ction 564(b)(1) of  the Act, 21 U.S.C. section 360bbb-3(b)(1), unless the authorization is terminated or revoked sooner.  Performed at [facility], [place] [address], [place], [place] [address]   Glucose, capillary     Status: Abnormal   Collection Time: 04/03/21 12:00 PM  Result Value Ref Range   Glucose-Capillary 130 (H) 70 - 99 mg/dL    Comment: Glucose reference range applies only to samples taken after fasting for at least 8 hours.  Cytology - Non PAP;     Status: None   Collection Time: 04/03/21  1:31 PM  Result Value Ref Range   CYTOLOGY - NON GYN  CYTOLOGY - NON PAP CASE: [id number] PATIENT: Wilfred Gaut Non-Gynecological Cytology Report     Specimen Submitted: A. Bronchoalveolar lavage  Clinical History: None provided      DIAGNOSIS: A. BRONCHUS INTERMEDIUS; LAVAGE: - NEGATIVE FOR MALIGNANCY. - BENIGN SQUAMOUS EPITHELIAL CELLS IN A BACKGROUND OF ABUNDANT ACUTE INFLAMMATION AND BACTERIAL COLONIES. - RECOMMEND CORRELATION WITH MICROBIOLOGY STUDIES.  GROSS DESCRIPTION: A. Site: Bronchus intermedius Procedure: Bronch Cytology specimen: Lavage Cytotechnologist(s): Rivka Barbara, Alwyn Pea Specimen material collected and submitted for: 0 diff Quik stained slides 0 Pap stained slides Specimen material submitted for: Cell block, ThinPrep, BAL material in RPMI for fluid cell count  The cell block material is fixed in formalin for 6 hours prior to processing.  Specimen description: Fixative: CytoLyt Volume: Approximately 30 mL Color: Pink Transparency: Cloudy Tissue fragments present: Yes  Collection brush(es) present: No  RB 04/03/2021    Final Diagnosis performed by Betsy Pries, MD.   Electronically signed 04/04/2021 2:55:13PM The electronic signature indicates that the named Attending Pathologist has evaluated the specimen Technical component performed at [place], [address] [place], [place] [address] Lab: [phone] Dir: Rush Farmer, MD,  MMM  Professional component performed at [facility], [facility], [place], [place], [place] [address] Lab: [phone] Dir: Dellia Nims. Rubinas, MD   Cytology - Non PAP;     Status: None   Collection Time: 04/03/21  1:32 PM  Result Value Ref Range   CYTOLOGY - NON GYN      CYTOLOGY - NON PAP CASE: [id number] PATIENT: Earle Novak Non-Gynecological Cytology Report     Specimen Submitted: A. Bronchial brushing, right  Clinical History: None provided      DIAGNOSIS: A. BRONCHUS INTERMEDIUS, RIGHT; BRUSHING: - ATYPICAL / INCONCLUSIVE. - SQUAMOUS EPITHELIAL CELLS WITH MILD ATYPIA IN A BACKGROUND OF ABUNDANT ACUTE INFLAMMATION.  Comment: The atypia is not sufficient for a diagnosis of malignancy.  Correlation with microbiology studies is recommended.   GROSS DESCRIPTION: A. Site: Bronchus intermedius Procedure: Bronch Cytology specimen: Brushing Cytotechnologist(s): Rivka Barbara, Alwyn Pea Specimen material collected and submitted for:  4 diff Quik stained slides  0 Pap stained slides Specimen material submitted for: Cell block and ThinPrep  The cell block material is fixed in formalin for 6 hours prior to processing.  Specimen description: Fixative: CytoLyt Volume: Approximately 25 mL Color: Slightly pink Tr ansparency: Clear Tissue fragments present: Yes Collection brush(es) present: Yes, 1   RB 04/03/2021  Final Diagnosis performed by Betsy Pries, MD.   Electronically signed 04/04/2021 3:19:05PM The electronic signature indicates that the named Attending Pathologist has evaluated the specimen Technical component performed at [facility], [address], [place], [place] [address] Lab: [phone] Dir: Rush Farmer, MD, MMM  Professional component performed at [facility], [facility], [place], [place], [place] [address] Lab: [phone] Dir: Dellia Nims. Rubinas, MD   Fungus Culture With Stain     Status:  None (Preliminary result)   Collection Time: 04/03/21  1:48 PM  Result Value Ref Range   Fungus Stain Final report     Comment: (NOTE) Performed At: [facility] [place], [place] [id number] Rush Farmer MD [id number]    Fungus (Mycology) Culture PENDING    Fungal Source BRONCHIAL ALVEOLAR LAVAGE     Comment: Performed at [facility], [address] [place], [place] [address]  Gram stain     Status: None   Collection Time: 04/03/21  1:48 PM   Specimen: Bronchoalveolar Lavage; Respiratory  Result Value Ref  Range   Specimen Description      BRONCHIAL ALVEOLAR LAVAGE Performed at [facility], [address] [place], [place] [address]    Special Requests      NONE Performed at [facility], [place], [place] [address]    Gram Stain      MODERATE WBC PRESENT, PREDOMINANTLY PMN MODERATE GRAM POSITIVE COCCI IN CLUSTERS IN CHAINS Performed at [facility], [place] [address], [place], [place] [address]    Report Status 04/04/2021 FINAL   Acid Fast Smear (AFB)     Status: None   Collection Time: 04/03/21  1:48 PM   Specimen: Bronchoalveolar Lavage; Respiratory  Result Value Ref Range   AFB Specimen Processing Concentration    Acid Fast Smear Negative     Comment: (NOTE) Performed At: [facility] [place], [place] [id number] Rush Farmer MD [id number]    Source (AFB) BRONCHIAL ALVEOLAR LAVAGE     Comment: Performed at [facility], [address] [place], [place] [address]  Fungus Culture Result     Status: None   Collection Time: 04/03/21  1:48 PM  Result Value Ref Range   Result 1 Comment     Comment: (NOTE) KOH/Calcofluor preparation:  no fungus observed. Performed At: [facility] [place], [place] [id number] Rush Farmer MD [id number]     Radiology: No results found.  No results found.  No results  found.    Assessment and Plan: Patient Active Problem List   Diagnosis Date Noted   Solitary pulmonary nodule 02/19/2021   Hemoptysis 02/19/2021   COPD, severe ([place]) 09/03/2020   Frequent falls 05/17/2020   Weakness 05/17/2020   Abnormality of gait 05/17/2020   Dizziness 05/17/2020   Left sided lacunar infarction ([place]) 04/06/2020   Syncope and collapse 01/31/2020   Other symptoms and signs involving the nervous system 01/31/2020   Chronic obstructive pulmonary disease with acute exacerbation ([place]) 10/23/2019   Fungal infection of toenail 10/23/2019   Encounter for general adult medical examination with abnormal findings 08/21/2019   Need for vaccination against Streptococcus pneumoniae 08/21/2019   Flu vaccine need 07/14/2019   Type 2 diabetes mellitus with hyperglycemia, without long-term current use of insulin ([place]) 04/13/2019   Hoarseness of voice 03/05/2019   Acne vulgaris 12/05/2018   Atrial fibrillation ([place]) 05/02/2018   Atrial flutter ([place]) 04/09/2018   Uncontrolled type 2 diabetes mellitus with hyperglycemia ([place]) 01/09/2018   Nicotine dependence with current use 01/09/2018   Diabetes mellitus without complication ([place]) 37/16/9678   Myalgia 09/19/2017   Vitamin B12 deficiency anemia 09/19/2017   Common migraine with intractable migraine 09/19/2017   Chronic hypoxemic respiratory failure ([place]) 09/19/2017   Peripheral vascular disease, unspecified ([place]) 09/19/2017   Dysuria 09/19/2017   Osteoarthritis 09/19/2017   Diverticulitis of intestine 09/19/2017   Cervicalgia 09/19/2017   Unspecified abdominal hernia without obstruction or gangrene 09/19/2017   Vasomotor rhinitis 09/19/2017   Occlusion and stenosis of bilateral carotid arteries 09/19/2017   Generalized anxiety disorder 09/19/2017   Malaise 09/19/2017   Chronic bronchitis ([place]) 09/19/2017   OSA on CPAP 09/19/2017   Cardiac arrhythmia 09/19/2017   Smoker 04/02/2016   Pain in the chest 08/20/2014   SOB  (shortness of breath) 08/20/2014   Coronary artery disease involving native coronary artery of native heart with angina pectoris with documented spasm ([place]) 08/20/2014   Emphysema of lung ([place]) 08/20/2014   Coronary artery disease involving native coronary artery of  native heart with unstable angina pectoris ([place]) 08/20/2014   AAA (abdominal aortic aneurysm) without rupture ([place]) 08/20/2014   Iliac artery aneurysm, bilateral ([place]) 08/20/2014   Hyperlipidemia 08/20/2014   Essential hypertension 08/20/2014   Morbid obesity ([place]) 08/20/2014    1. SOB (shortness of breath) Recheck his oxygen requirements - 6 minute walk; Future  2. Lung mass We will schedule him for CT-guided needle biopsy if radiology will be willing to do that.  If the radiologist cannot be then we will probably need to interventional pulmonology to see the patient. - CT GUIDED NEEDLE PLACEMENT; Future  3. Chronic atrial fibrillation (HCC) Right now his rate is controlled we will continue with management and will follow up with his primary cardiologist  4. COPD, severe ([place]) Severe disease patient has been on chronic oxygen therapy  5. OSA on CPAP Has been on CPAP therapy   General Counseling: I have discussed the findings of the evaluation and examination with Arias.  I have also discussed any further diagnostic evaluation thatmay be needed or ordered today. Dontae verbalizes understanding of the findings of todays visit. We also reviewed his medications today and discussed drug interactions and side effects including but not limited excessive drowsiness and altered mental states. We also discussed that there is always a risk not just to him but also people around him. he has been encouraged to call the office with any questions or concerns that should arise related to todays visit.  Orders Placed This Encounter  Procedures   6 minute walk    Standing Status:   Future    Standing Expiration Date:   04/13/2022      Time spent: 15  I have personally obtained a history, examined the patient, evaluated laboratory and imaging results, formulated the assessment and plan and placed orders.    [name], MD [facility] Pulmonary and Critical Care Sleep medicine

## 2021-04-13 NOTE — Patient Instructions (Signed)
Pulmonary Nodule ?A pulmonary nodule is a small, round growth of tissue in the lung. A nodule may be cancer, but most nodules are not cancer. ?What are the causes? ?Infection from a germ (bacteria, fungus, or virus), such as tuberculosis. ?Tissue that is cancer, such as: ?Cancer in the lung. ?Cancer that has spread to the lung from another part of the body. ?A growth of tissue (mass) that is not cancer. ?Swelling and irritation from conditions such as rheumatoid arthritis. ?Having blood vessels that are not normal in the lungs. ?What are the signs or symptoms? ?Many times, there are no symptoms. If you get symptoms, they normally have another cause, such as infection. ?How is this treated? ?Treatment depends on: ?If your nodule is cancer or if it is not cancer. ?What your risk of getting cancer is. ?Some nodules are not cancer. If this is the case for you, you may not need treatment. Your doctor may do tests to watch the nodule for changes. ?If the nodule is cancer: ?You will need tests, such as CT and PET scans. ?You may need treatment. This may include: ?Surgery. ?Treatment with high-energy X-rays (radiation therapy). ?Medicines. ?Some nodules need to be taken out. You may have a procedure to have the nodule taken out. During the procedure, your doctor will make a cut (incision) into your chest and take out the part of your lung that has the nodule. ?Follow these instructions at home: ?Take over-the-counter and prescription medicines only as told by your doctor. ?Do not smoke or use any products that contain nicotine or tobacco. If you need help quitting, ask your doctor. ?Keep all follow-up visits. ?Contact a doctor if: ?You have pain in your chest, back, or shoulder. ?You are short of breath or have trouble breathing when you are active. ?You get a cough. ?Your voice starts to sound raspy, breathy, or strained (hoarse), and you do not know why. ?You feel sick or more tired than normal. ?You do not feel like  eating. ?You lose weight without trying. ?You get chills, or you start to sweat a lot during sleep. ?You need two or more pillows to sleep on at night. ?You have: ?A fever and your symptoms get worse all of a sudden. ?A fever or symptoms for more than 2-3 days. ?Get help right away if: ?You cannot catch your breath. ?You have sudden chest pain. ?You start making high-pitched whistling sounds when you breathe, most often when you breathe out (you wheeze). ?You cannot stop coughing. ?You cough up blood or bloody mucus from your lungs (sputum). ?You get dizzy or feel like you may faint. ?These symptoms may represent a serious problem that is an emergency. Do not wait to see if the symptoms will go away. Get medical help right away. Call your local emergency services (911 in the [place] Do not drive yourself to the hospital. ?Summary ?A pulmonary nodule is a small, round growth of tissue in the lung. Most of these nodules are not cancer. ?Common causes of nodules in the lung include infection, swelling and irritation, and growths that are not cancer. ?Treatment depends on whether the nodule is cancer or is not cancer. Treatment also depends on your risk of getting cancer. ?If the nodule is cancer, you will need certain tests and treatments as told by your doctor. ?This information is not intended to replace advice given to you by your health care provider. Make sure you discuss any questions you have with your health care provider. ?Document Revised:   03/23/2020 Document Reviewed: 03/23/2020 ?Elsevier Patient Education ? 2022 Elsevier Inc. ? ?

## 2021-04-20 ENCOUNTER — Telehealth: Payer: Self-pay

## 2021-04-20 NOTE — Telephone Encounter (Signed)
Pt's daughter called and informed me that pt was coughing last night and spit up a lot of blood ( 1 pint probably) then later he was vomiting up blood.  I advised daughter that he should go to the hospital to be evaluated and she said pt would refuse.  I asked to speak to pt and told him the [place] would want him to go be checked at ER and would be upset if pt didn't go, pt advised that he was ok right now.   I asked pt if he would please promise me that he would let his daughter call EMS or take him to the ER if it happened again and he said he promised.  Pt asked about the biopsy he was to be scheduled for and I spoke to Tat and she advised that the scheduling people would call him anytime now to get him scheduled.

## 2021-04-24 ENCOUNTER — Telehealth: Payer: Self-pay

## 2021-04-24 NOTE — Telephone Encounter (Signed)
[employer] Dept called and advised that patient had passed away.

## 2021-04-25 ENCOUNTER — Telehealth: Payer: Self-pay

## 2021-04-25 ENCOUNTER — Encounter: Payer: Self-pay | Admitting: Internal Medicine

## 2021-04-25 NOTE — Telephone Encounter (Signed)
Pt's daughter n law called Filip Trimmer) [phone] she informed us that she was the pt's POA, I took her information and documented and updated the chart.  Shirlean Mylar is the wife of the patients son.

## 2021-04-25 NOTE — Telephone Encounter (Signed)
Called and spoke to pt's daughter to give our condolences.  Checked up on how she was doing and informed her to call if she needed [place].  Daughter thanked [place] for taking care of her father for all the years he was a patient.

## 2021-04-25 NOTE — Telephone Encounter (Signed)
Spoke with [facility] pharmacist  amit to hold all his pres due to pt deceased as per pharmacist he said they already mark him as  deceased

## 2021-04-27 ENCOUNTER — Telehealth: Payer: Self-pay

## 2021-04-27 ENCOUNTER — Telehealth: Payer: Self-pay | Admitting: Internal Medicine

## 2021-04-27 ENCOUNTER — Other Ambulatory Visit: Payer: Self-pay

## 2021-04-27 NOTE — H&P (Signed)
Please see office note consult

## 2021-04-27 NOTE — Telephone Encounter (Signed)
Pt's daughter Lawrence Steele called and requested to speak to Dr Devona Konig, I informed daughter that he wasn't here in the office today but I would give him the message that she would like to speak to him.  Her call back # [phone]

## 2021-04-27 NOTE — Patient Outreach (Signed)
[facility]) Care Management  04/27/2021  Lawrence Steele August 13, 1951 [id number]   Patient recently deceased.  CM will close case and program.  Jone Baseman, RN, MSN [place] [employer] Care Management Coordinator Direct Line [phone] Cell [phone] Toll Free: [phone]  Fax: [phone]

## 2021-04-27 NOTE — Telephone Encounter (Signed)
Received call from [name] deceased patients daughter stating that she had been told the her fathers death is now being investigated as a homocide. I told her that I had not heard anything to this effect and I had not been contacted by the police. She also wanted to know if the patient could have died from the aneurysm and I told her it is certainly possible a ruptured aneurysm could take a persons life very quickly. She also told me that her father took his own medications and would sometimes forget if he took them and she would tell him that he had taken them but she was not aware of all his medications.

## 2021-05-01 ENCOUNTER — Ambulatory Visit: Payer: Medicare HMO | Admitting: Cardiovascular Disease

## 2021-05-05 LAB — FUNGUS CULTURE WITH STAIN

## 2021-05-05 LAB — FUNGUS CULTURE RESULT

## 2021-05-05 LAB — FUNGAL ORGANISM REFLEX

## 2021-05-08 ENCOUNTER — Ambulatory Visit: Payer: Medicare HMO | Admitting: Internal Medicine

## 2021-05-11 DIAGNOSIS — J449 Chronic obstructive pulmonary disease, unspecified: Secondary | ICD-10-CM | POA: Diagnosis not present

## 2021-05-18 LAB — ACID FAST CULTURE WITH REFLEXED SENSITIVITIES (MYCOBACTERIA): Acid Fast Culture: NEGATIVE

## 2021-05-18 DEATH — deceased

## 2021-05-24 NOTE — H&P (Signed)
[facility] [place], [place] [address]   Internal MEDICINE  Office Visit Note   Patient Name: Lawrence Steele  [id number]  [id number]   Date of Service: 03/17/2021       Chief Complaint  Patient presents with   Follow-up      Review labs, patient has been not been feeling well at all, patient states he normally stay in bed because he's always cold.     Quality Metric Gaps      Foot exam      HPI Quantavis presents for a follow up visit to discuss discuss test results. He is also not feeling well, laying in bed most of the day, fatigued, always feels cold, still coughing up blood on a daily basis per patient report. He is still an every day cigarette smoker. His blood pressure is still low and he is bradycardic at 55 bpm. He had the PET scan done and lab work. The bronchoscopy has not been done yet and [place] is upset about this and wants to get it done soon.        Current Medication:      Outpatient Encounter Medications as of 03/17/2021  Medication Sig Note   Accu-Chek Softclix Lancets lancets Use as instructed  Once a daily E11.65     acetaminophen (TYLENOL) 500 MG tablet Take 1,000 mg by mouth every 6 (six) hours as needed for headache.     Aclidinium Bromide 400 MCG/ACT AEPB Inhale 400 mg into the lungs 2 (two) times daily.     albuterol (VENTOLIN HFA) 108 (90 Base) MCG/ACT inhaler Inhale 2 puffs into the lungs every 6 (six) hours as needed for wheezing or shortness of breath.     allopurinol (ZYLOPRIM) 100 MG tablet Take 1 tablet (100 mg total) by mouth 2 (two) times daily.     apixaban (ELIQUIS) 5 MG TABS tablet Take 1 tablet (5 mg total) by mouth 2 (two) times daily.     atenolol (TENORMIN) 25 MG tablet Take 2 tablets (50 mg) in the morning and 1 tablet (25 mg) in the evening, (Patient taking differently: Take 25-50 mg by mouth 2 (two) times daily. Take 2 tablets (50 mg) in the morning and 1 tablet (25 mg) in the evening,)     atorvastatin (LIPITOR) 20 MG  tablet Take 1 tablet by mouth once daily     Blood Glucose Monitoring Suppl (ACCU-CHEK AVIVA PLUS) w/Device KIT Use as directed     calcium carbonate (TUMS EX) 750 MG chewable tablet Chew by mouth.     cyclobenzaprine (FLEXERIL) 10 MG tablet Take 1 tablet (10 mg total) by mouth 2 (two) times daily at 10 AM and 5 PM.     diclofenac sodium (VOLTAREN) 1 % GEL Apply 1 application topically 4 (four) times daily as needed (pain).      doxycycline (VIBRA-TABS) 100 MG tablet Take 1 tablet (100 mg total) by mouth daily.     escitalopram (LEXAPRO) 5 MG tablet Take 1 tablet (5 mg total) by mouth daily.     Fe Cbn-Fe Gluc-FA-B12-C-DSS (FERRALET 90) 90-1 MG TABS Take 1 mg by mouth daily.     Fluticasone-Salmeterol (ADVAIR) 250-50 MCG/DOSE AEPB Inhale 1 puff into the lungs 2 (two) times daily.     furosemide (LASIX) 20 MG tablet TAKE 2 TABLETS BY MOUTH EVERY OTHER DAY ,ALTERNATE  WITH  1  TABLET     gabapentin (NEURONTIN) 400 MG capsule Decrease to TID for several  days. If tolerated well, may decrease to twice daily.     glucose blood (ACCU-CHEK AVIVA PLUS) test strip 1 each by Other route as needed for other. Use as instructed     hydroxypropyl methylcellulose / hypromellose (ISOPTO TEARS / GONIOVISC) 2.5 % ophthalmic solution Place 1 drop into both eyes 4 (four) times daily as needed (eye irritation (sand like feeling)).      ipratropium (ATROVENT) 0.02 % nebulizer solution Take 2.5 mLs (0.5 mg total) by nebulization every 6 (six) hours as needed for wheezing or shortness of breath. Use every 6 hours for Shortness of breath and wheezing and as needed. J44.9, J96.11  Prescription is for 90 days     ipratropium-albuterol (DUONEB) 0.5-2.5 (3) MG/3ML SOLN Inhale 3 mLs into the lungs every 6 (six) hours as needed.     levofloxacin (LEVAQUIN) 500 MG tablet Take 1 tablet (500 mg total) by mouth daily.     loratadine (CLARITIN) 10 MG tablet Take 10 mg by mouth daily.     meloxicam (MOBIC) 7.5 MG tablet Take 1 tablet  (7.5 mg total) by mouth 2 (two) times daily.     metFORMIN (GLUCOPHAGE) 500 MG tablet Take 1 tablet (500 mg total) by mouth 2 (two) times daily with a meal.     nitroGLYCERIN (NITROSTAT) 0.4 MG SL tablet Place 0.4 mg under the tongue every 5 (five) minutes x 3 doses as needed for chest pain.      nystatin (MYCOSTATIN) 100000 UNIT/ML suspension Take 5 mLs (500,000 Units total) by mouth 4 (four) times daily as needed (mouth/throat irritation.).     oxyCODONE-acetaminophen (PERCOCET) 10-325 MG tablet Take 1 tablet by mouth 4 (four) times daily.     OXYGEN Inhale 2 L into the lungs continuous.      predniSONE (DELTASONE) 10 MG tablet Take one tab 3 x day for 3 days, then take one tab 2 x a day for 3 days and then take one tab a day for 3 days for copd     Roflumilast (DALIRESP) 250 MCG TABS Take 250 mcg by mouth daily.     tiotropium (SPIRIVA) 18 MCG inhalation capsule Place 18 mcg into inhaler and inhale daily.  03/11/2020: Uses interchangeably with Spiriva   traMADol (ULTRAM) 50 MG tablet tramadol 50 mg tablet     traZODone (DESYREL) 100 MG tablet Take 1 tablet (100 mg total) by mouth at bedtime.     umeclidinium bromide (INCRUSE ELLIPTA) 62.5 MCG/INH AEPB Inhale 1 puff into the lungs daily.      No facility-administered encounter medications on file as of 03/17/2021.      Surgical History:      Past Surgical History:  Procedure Laterality Date   ABDOMINAL AORTIC ANEURYSM REPAIR   03/06/2015    [facility]    ADENOIDECTOMY       AMPUTATION Left      partial amputation   APPENDECTOMY       CARDIAC CATHETERIZATION   2010,03/16/2003,11/04/2002   CARDIOVERSION N/A 04/24/2018    Procedure: CARDIOVERSION;  Surgeon: Nelva Bush, MD;  Location: [facility];  Service: Cardiovascular;  Laterality: N/A;   CARDIOVERSION N/A 09/05/2018    Procedure: CARDIOVERSION;  Surgeon: Minna Merritts, MD;  Location: [facility];  Service: Cardiovascular;  Laterality: N/A;   CARDIOVERSION N/A 11/24/2018    Procedure:  CARDIOVERSION (CATH LAB);  Surgeon: Wellington Hampshire, MD;  Location: [facility];  Service: Cardiovascular;  Laterality: N/A;   CARDIOVERSION N/A 03/18/2019    Procedure:  CARDIOVERSION;  Surgeon: Minna Merritts, MD;  Location: [facility];  Service: Cardiovascular;  Laterality: N/A;   CARDIOVERSION N/A 11/13/2019    Procedure: CARDIOVERSION;  Surgeon: Minna Merritts, MD;  Location: [facility];  Service: Cardiovascular;  Laterality: N/A;   CORONARY ANGIOPLASTY   03/16/2003    stent to the RCA  & 2/18/2004stent mid circumflex   ELBOW SURGERY       HERNIA REPAIR       NASAL SINUS SURGERY       PENILE Agar       TEE WITHOUT CARDIOVERSION N/A 04/24/2018    Procedure: TRANSESOPHAGEAL ECHOCARDIOGRAM (TEE);  Surgeon: Nelva Bush, MD;  Location: [facility];  Service: Cardiovascular;  Laterality: N/A;   TONSILECTOMY, ADENOIDECTOMY, BILATERAL MYRINGOTOMY AND TUBES       UMBILICAL HERNIA REPAIR          Medical History:     Past Medical History:  Diagnosis Date   AAA (abdominal aortic aneurysm) ([place])      a.  Status post endovascular repair at [facility] in 2016   Anxiety     Asthma     Atrial flutter ([place])      a. diagnosed 7/19; b. CHADS2VASc => 5 (CHF, HTN, age x 1, DM, vascular disease); c. on Eliquis; d. s/p TEE/DCCV 8/19 with repeat DCCV 12/19   Cancer ([place])      melanoma L index finger    Chronic diastolic CHF (congestive heart failure) (HCC)     COPD (chronic obstructive pulmonary disease) ([place])     Coronary artery disease      a. LHC 5/10: left main 10%, mLAD-1 lesion 20%, mLAD-2 lesion 20%, D1 20%, mLCx-1 lesion 30%, mLCx-2 lesion 50%, OM1 20%, OM to 20%, LPL 20%, pRCA-1 lesion 20%, pRCA-2 lesion 20%.  EF 51% w/ mild inf HK   Diabetes mellitus with complication (HCC)     Hyperlipidemia     Hypertension     Iliac artery aneurysm, bilateral (HCC)     Obstructive sleep apnea-hypopnea syndrome        Family History:      Family History   Problem Relation Age of Onset   Hypertension Mother     Hyperlipidemia Mother     Depression Mother     Heart attack Father 68   Heart disease Father     Hypertension Father     Hyperlipidemia Father        Social History         Socioeconomic History   Marital status: Divorced      Spouse name: Not on file   Number of children: 2   Years of education: 10    Highest education level: 10th grade  Occupational History   Occupation: disabled   Tobacco Use   Smoking status: Every Day      Packs/day: 0.25      Years: 45.00      Pack years: 11.25      Types: Cigarettes   Smokeless tobacco: Former      Types: Chew   Tobacco comments:      3 to 4 cigarettes a day  Vaping Use   Vaping Use: Never used  Substance and Sexual Activity   Alcohol use: Not Currently   Drug use: No   Sexual activity: Not on file  Other Topics Concern   Not on file  Social History Narrative   Not on file  Social Determinants of Health       Financial Resource Strain: Not on file  Food Insecurity: No Food Insecurity   Worried About Charity fundraiser in the Last Year: Never true   Ran Out of Food in the Last Year: Never true  Transportation Needs: No Transportation Needs   Lack of Transportation (Medical): No   Lack of Transportation (Non-Medical): No  Physical Activity: Not on file  Stress: Not on file  Social Connections: Not on file  Intimate Partner Violence: Not on file          Review of Systems  Constitutional:  Positive for appetite change, chills, fatigue and unexpected weight change (has lost 9 lbs in the past month). Negative for fever.  HENT: Negative.    Respiratory:  Positive for cough, chest tightness, shortness of breath and wheezing.        Hemoptysis  Cardiovascular:  Negative for chest pain and palpitations.  Gastrointestinal: Negative.  Negative for abdominal pain, constipation, diarrhea, nausea and vomiting.  Genitourinary: Negative.   Musculoskeletal:   Positive for arthralgias.  Neurological:  Negative for dizziness, light-headedness and headaches.  Psychiatric/Behavioral:  Positive for behavioral problems (depressed mood). Negative for self-injury and suicidal ideas.     Vital Signs: BP (!) 93/58   Pulse (!) 55   Temp 98.1 F (36.7 C)   Resp 16   Ht 5' 10"  (1.778 m)   Wt 226 lb 12.8 oz (102.9 kg)   SpO2 98%   BMI 32.54 kg/m      Physical Exam Constitutional:      General: He is not in acute distress.    Appearance: He is obese. He is ill-appearing.  HENT:     Head: Normocephalic and atraumatic.  Cardiovascular:     Rate and Rhythm: Normal rate and regular rhythm.     Pulses: Normal pulses.     Heart sounds: Normal heart sounds. No murmur heard. Pulmonary:     Effort: Accessory muscle usage present.     Breath sounds: Decreased air movement present. Examination of the right-upper field reveals wheezing. Examination of the left-upper field reveals wheezing. Examination of the right-middle field reveals wheezing. Examination of the left-middle field reveals wheezing. Examination of the right-lower field reveals rhonchi. Wheezing and rhonchi present.  Skin:    General: Skin is warm and dry.     Capillary Refill: Capillary refill takes less than 2 seconds.  Neurological:     Mental Status: He is alert and oriented to person, place, and time.  Psychiatric:        Mood and Affect: Mood is depressed. Affect is angry.        Assessment/Plan: 1. Abnormal positron emission tomography (PET) scan -discussed results with patient. Still waiting for brochoscopy with biopsy to be done to determine what the mass in the right lung is and rule out or confirm malignancy. If malignancy is confirmed, the PET scan has already identified hypermetabolic lymph nodes in the inferior right hilum which could be possible metastasis. Other incidental findings were mentioned in the report as well but none of those findings need immediate intervention.  Will reach out to Dr. Devona Konig about the Bronchoscopy and/or the scheduling staff to ensure [place] gets a date for the bronchoscopy which was ordered at his last office visit.  PET scan results: 1. Marked hypermetabolism in the masslike opacification of the right lower lobe with central photopenia indicating necrosis. The most hypermetabolic component of this lesion is in  the paraspinal medial right lower lobe. Imaging features concerning for neoplastic etiology. If possible, repeat CT chest with contrast may prove helpful to further delineate soft tissue disease. 2. Hypermetabolic lymph nodes in the inferior right hilum and subcarinal station are concerning for metastatic involvement. 3. No unexpected or suspicious hypermetabolic uptake in the neck, abdomen, or pelvis. 4. 14 mm right adrenal nodule has low attenuation consistent with adenoma. No hypermetabolism. 5. Nodular liver contour compatible with cirrhosis. 6.  Aortic Atherosclerois (ICD10-170.0)   2. Solitary pulmonary nodule Bronchoscopy previously ordered, waiting for the procedure to be scheduled.    3. Hemoptysis Durante reports daily hemoptysis now. This has been happening for 4 months now.    4. SOB (shortness of breath) Course of prednisone ordered for patient to decrease inflammation and ease his breathing. He continues to have SOB, wheezing and cough.  - predniSONE (DELTASONE) 10 MG tablet; Take one tab 3 x day for 3 days, then take one tab 2 x a day for 3 days and then take one tab a day for 3 days for copd  Dispense: 18 tablet; Refill: 0   5. Iatrogenic hypotension Monitor BP at home, reduce b blocker if needed     General Counseling: Albeiro verbalizes understanding of the findings of todays visit and agrees with plan of treatment. I have discussed any further diagnostic evaluation that may be needed or ordered today. We also reviewed his medications today. he has been encouraged to call the office with any questions  or concerns that should arise related to todays visit.       No orders of the defined types were placed in this encounter.         Meds ordered this encounter  Medications   predniSONE (DELTASONE) 10 MG tablet      Sig: Take one tab 3 x day for 3 days, then take one tab 2 x a day for 3 days and then take one tab a day for 3 days for copd      Dispense:  18 tablet      Refill:  0      Return for previously scheduled follow up.     Total time spent:30 Minutes Time spent includes review of chart, medications, test results, and follow up plan with the patient.    [place] Controlled Substance Database was reviewed by me.   This patient was seen by Jonetta Osgood, FNP-C in collaboration with Dr. Devona Konig and Dr Clayborn Bigness as a part of collaborative care agreement.     Alyssa R. Valetta Fuller, MSN, FNP-C Internal medicine           E

## 2021-06-01 ENCOUNTER — Ambulatory Visit: Payer: Self-pay

## 2021-06-09 ENCOUNTER — Ambulatory Visit: Payer: Self-pay | Admitting: Urology

## 2021-06-21 ENCOUNTER — Telehealth: Payer: Self-pay

## 2021-06-21 NOTE — Telephone Encounter (Signed)
Pt's daughter Jomar Denz called and was requesting for [place] to send a letter to the medical examiner for autopsy stating what medical problems patient had, I informed pt that she wasn't listed on the HIPPA and we couldn't do that and she could have them contact us directly.  Daughter also was asking if she could get pt's medical records but I told pt she couldn't due to her not being on pt's 20

## 2021-08-29 ENCOUNTER — Ambulatory Visit: Payer: Medicare HMO | Admitting: Nurse Practitioner

## 2021-09-05 IMAGING — CR DG CHEST 2V
1 series · 2 of 2 positions shown · non-contrast
Comparison: 09/03/2018

CLINICAL DATA: Cough, shortness of breath

EXAM:
CHEST - 2 VIEW

[Series 1: dg chest 2 view · 0.14mm/px · 2 of 2 slices shown]
[im 1/2]
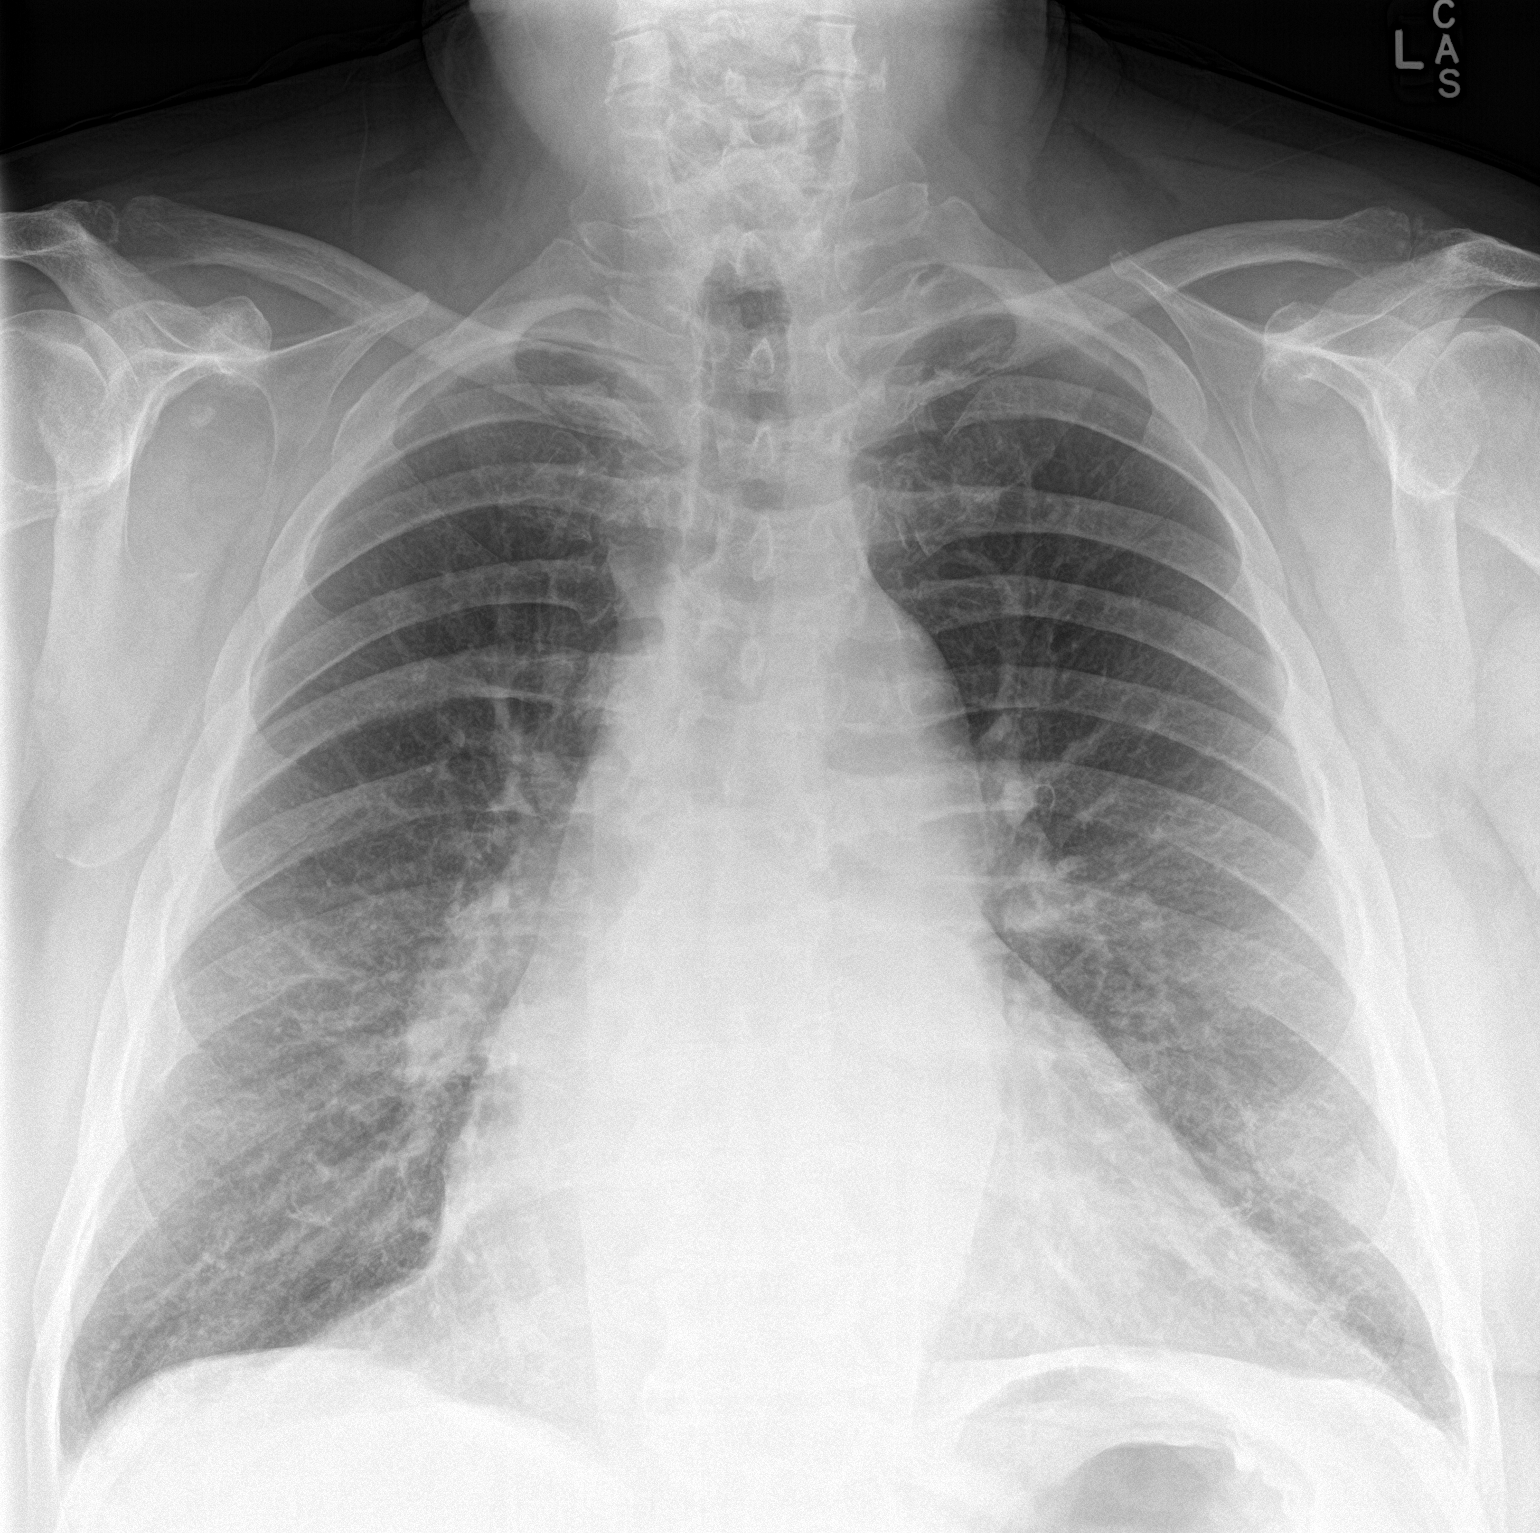
[im 2/2]
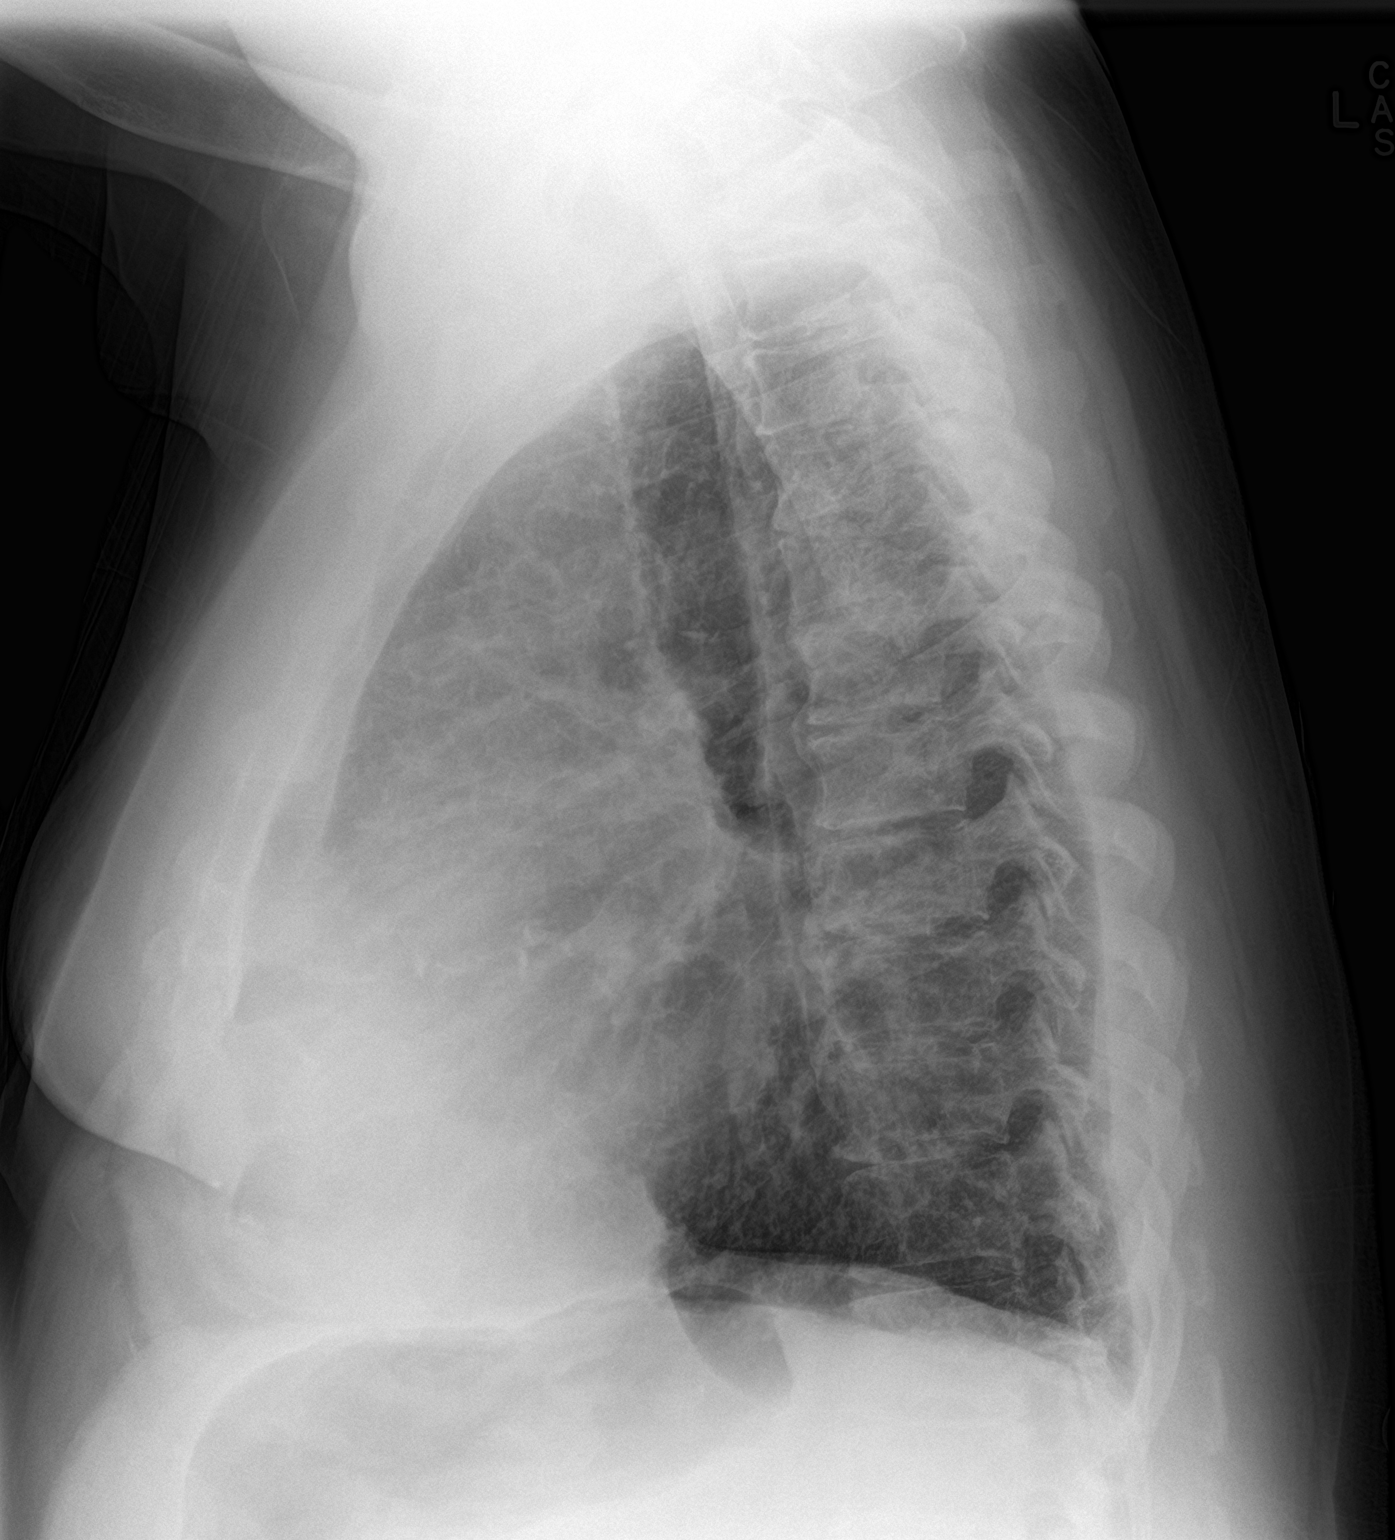

[2 of 2 positions shown; findings below may reference images not displayed]

FINDINGS: There is hyperinflation of the lungs compatible with COPD.
Cardiomegaly. No confluent opacities or effusions. No acute bony
abnormality.
IMPRESSION: COPD, cardiomegaly.  No active disease.
